# Patient Record
Sex: Female | Born: 1970 | Race: White | Hispanic: No | Marital: Married | Smoking: Never smoker
Health system: Southern US, Community
[De-identification: ages and names within clinical notes are randomized; demographics above are authoritative.]

## PROBLEM LIST (undated history)

## (undated) DIAGNOSIS — I499 Cardiac arrhythmia, unspecified: Secondary | ICD-10-CM

## (undated) DIAGNOSIS — K263 Acute duodenal ulcer without hemorrhage or perforation: Secondary | ICD-10-CM

## (undated) DIAGNOSIS — K2100 Gastro-esophageal reflux disease with esophagitis, without bleeding: Secondary | ICD-10-CM

## (undated) DIAGNOSIS — D649 Anemia, unspecified: Secondary | ICD-10-CM

## (undated) DIAGNOSIS — R51 Headache: Secondary | ICD-10-CM

## (undated) DIAGNOSIS — K219 Gastro-esophageal reflux disease without esophagitis: Secondary | ICD-10-CM

## (undated) DIAGNOSIS — D45 Polycythemia vera: Secondary | ICD-10-CM

## (undated) DIAGNOSIS — R519 Headache, unspecified: Secondary | ICD-10-CM

## (undated) HISTORY — DX: Acute duodenal ulcer without hemorrhage or perforation: K26.3

## (undated) HISTORY — DX: Gastro-esophageal reflux disease with esophagitis: K21.0

## (undated) HISTORY — PX: UPPER GI ENDOSCOPY: SHX6162

## (undated) HISTORY — DX: Gastro-esophageal reflux disease with esophagitis, without bleeding: K21.00

## (undated) HISTORY — DX: Gastro-esophageal reflux disease without esophagitis: K21.9

## (undated) HISTORY — PX: CHOLECYSTECTOMY: SHX55

## (undated) HISTORY — PX: ABDOMINAL HYSTERECTOMY: SHX81

---

## 1997-12-22 ENCOUNTER — Inpatient Hospital Stay (HOSPITAL_COMMUNITY): Admission: AD | Admit: 1997-12-22 | Discharge: 1997-12-22 | Payer: Self-pay | Admitting: Obstetrics and Gynecology

## 1998-01-27 ENCOUNTER — Inpatient Hospital Stay (HOSPITAL_COMMUNITY): Admission: AD | Admit: 1998-01-27 | Discharge: 1998-01-30 | Payer: Self-pay | Admitting: Obstetrics and Gynecology

## 1999-04-08 ENCOUNTER — Emergency Department (HOSPITAL_COMMUNITY): Admission: EM | Admit: 1999-04-08 | Discharge: 1999-04-08 | Payer: Self-pay | Admitting: Emergency Medicine

## 2000-06-29 ENCOUNTER — Inpatient Hospital Stay (HOSPITAL_COMMUNITY): Admission: AD | Admit: 2000-06-29 | Discharge: 2000-06-29 | Payer: Self-pay | Admitting: Obstetrics and Gynecology

## 2000-08-22 ENCOUNTER — Inpatient Hospital Stay (HOSPITAL_COMMUNITY): Admission: AD | Admit: 2000-08-22 | Discharge: 2000-08-24 | Payer: Self-pay | Admitting: Obstetrics and Gynecology

## 2000-10-03 ENCOUNTER — Inpatient Hospital Stay (HOSPITAL_COMMUNITY): Admission: AD | Admit: 2000-10-03 | Discharge: 2000-10-06 | Payer: Self-pay | Admitting: Obstetrics and Gynecology

## 2000-10-27 ENCOUNTER — Other Ambulatory Visit: Admission: RE | Admit: 2000-10-27 | Discharge: 2000-10-27 | Payer: Self-pay | Admitting: Obstetrics and Gynecology

## 2001-10-30 ENCOUNTER — Encounter (INDEPENDENT_AMBULATORY_CARE_PROVIDER_SITE_OTHER): Payer: Self-pay | Admitting: *Deleted

## 2001-10-30 ENCOUNTER — Ambulatory Visit (HOSPITAL_COMMUNITY): Admission: AD | Admit: 2001-10-30 | Discharge: 2001-10-30 | Payer: Self-pay | Admitting: *Deleted

## 2002-06-23 ENCOUNTER — Other Ambulatory Visit: Admission: RE | Admit: 2002-06-23 | Discharge: 2002-06-23 | Payer: Self-pay | Admitting: Obstetrics and Gynecology

## 2002-12-01 ENCOUNTER — Emergency Department (HOSPITAL_COMMUNITY): Admission: EM | Admit: 2002-12-01 | Discharge: 2002-12-01 | Payer: Self-pay | Admitting: Emergency Medicine

## 2002-12-09 ENCOUNTER — Encounter: Payer: Self-pay | Admitting: Internal Medicine

## 2003-09-01 ENCOUNTER — Other Ambulatory Visit: Admission: RE | Admit: 2003-09-01 | Discharge: 2003-09-01 | Payer: Self-pay | Admitting: Obstetrics and Gynecology

## 2004-03-07 ENCOUNTER — Encounter: Admission: RE | Admit: 2004-03-07 | Discharge: 2004-03-07 | Payer: Self-pay | Admitting: Family Medicine

## 2005-01-18 ENCOUNTER — Other Ambulatory Visit: Admission: RE | Admit: 2005-01-18 | Discharge: 2005-01-18 | Payer: Self-pay | Admitting: Obstetrics and Gynecology

## 2006-04-25 ENCOUNTER — Ambulatory Visit (HOSPITAL_COMMUNITY): Admission: RE | Admit: 2006-04-25 | Discharge: 2006-04-25 | Payer: Self-pay | Admitting: Obstetrics and Gynecology

## 2006-07-22 ENCOUNTER — Emergency Department (HOSPITAL_COMMUNITY): Admission: EM | Admit: 2006-07-22 | Discharge: 2006-07-22 | Payer: Self-pay | Admitting: Family Medicine

## 2007-03-25 ENCOUNTER — Ambulatory Visit: Payer: Self-pay | Admitting: Internal Medicine

## 2007-03-25 LAB — CONVERTED CEMR LAB
ALT: 15 units/L (ref 0–35)
AST: 14 units/L (ref 0–37)
Albumin: 4 g/dL (ref 3.5–5.2)
Alkaline Phosphatase: 111 units/L (ref 39–117)
Amylase: 47 units/L (ref 27–131)
BUN: 7 mg/dL (ref 6–23)
Bacteria, UA: NEGATIVE
Basophils Absolute: 0.1 10*3/uL (ref 0.0–0.1)
Basophils Relative: 1 % (ref 0.0–1.0)
Bilirubin Urine: NEGATIVE
Bilirubin, Direct: 0.1 mg/dL (ref 0.0–0.3)
CO2: 28 meq/L (ref 19–32)
Calcium: 9.6 mg/dL (ref 8.4–10.5)
Chloride: 106 meq/L (ref 96–112)
Creatinine, Ser: 0.7 mg/dL (ref 0.4–1.2)
Crystals: NEGATIVE
Eosinophils Absolute: 0.2 10*3/uL (ref 0.0–0.6)
Eosinophils Relative: 2.5 % (ref 0.0–5.0)
GFR calc Af Amer: 122 mL/min
GFR calc non Af Amer: 101 mL/min
Glucose, Bld: 113 mg/dL — ABNORMAL HIGH (ref 70–99)
HCT: 40.6 % (ref 36.0–46.0)
Hemoglobin, Urine: NEGATIVE
Hemoglobin: 14.4 g/dL (ref 12.0–15.0)
Ketones, ur: NEGATIVE mg/dL
Leukocytes, UA: NEGATIVE
Lipase: 21 units/L (ref 11.0–59.0)
Lymphocytes Relative: 28.5 % (ref 12.0–46.0)
MCHC: 35.5 g/dL (ref 30.0–36.0)
MCV: 84.6 fL (ref 78.0–100.0)
Monocytes Absolute: 0.5 10*3/uL (ref 0.2–0.7)
Monocytes Relative: 6.2 % (ref 3.0–11.0)
Mucus, UA: NEGATIVE
Neutro Abs: 5.1 10*3/uL (ref 1.4–7.7)
Neutrophils Relative %: 61.8 % (ref 43.0–77.0)
Nitrite: NEGATIVE
Platelets: 339 10*3/uL (ref 150–400)
Potassium: 4.1 meq/L (ref 3.5–5.1)
RBC: 4.79 M/uL (ref 3.87–5.11)
RDW: 12.6 % (ref 11.5–14.6)
Sodium: 140 meq/L (ref 135–145)
Specific Gravity, Urine: 1.015 (ref 1.000–1.03)
Total Bilirubin: 0.9 mg/dL (ref 0.3–1.2)
Total Protein, Urine: NEGATIVE mg/dL
Total Protein: 7.3 g/dL (ref 6.0–8.3)
Urine Glucose: NEGATIVE mg/dL
Urobilinogen, UA: 0.2 (ref 0.0–1.0)
WBC: 8.3 10*3/uL (ref 4.5–10.5)
pH: 6 (ref 5.0–8.0)

## 2007-03-30 ENCOUNTER — Ambulatory Visit (HOSPITAL_COMMUNITY): Admission: RE | Admit: 2007-03-30 | Discharge: 2007-03-30 | Payer: Self-pay | Admitting: Internal Medicine

## 2007-04-06 ENCOUNTER — Emergency Department (HOSPITAL_COMMUNITY): Admission: EM | Admit: 2007-04-06 | Discharge: 2007-04-06 | Payer: Self-pay | Admitting: Emergency Medicine

## 2007-06-10 ENCOUNTER — Encounter: Admission: RE | Admit: 2007-06-10 | Discharge: 2007-06-10 | Payer: Self-pay | Admitting: Orthopedic Surgery

## 2007-08-11 DIAGNOSIS — R519 Headache, unspecified: Secondary | ICD-10-CM | POA: Insufficient documentation

## 2007-08-11 DIAGNOSIS — Z8711 Personal history of peptic ulcer disease: Secondary | ICD-10-CM | POA: Insufficient documentation

## 2007-08-11 DIAGNOSIS — D509 Iron deficiency anemia, unspecified: Secondary | ICD-10-CM | POA: Insufficient documentation

## 2007-08-11 DIAGNOSIS — R51 Headache: Secondary | ICD-10-CM

## 2007-08-11 DIAGNOSIS — K209 Esophagitis, unspecified: Secondary | ICD-10-CM

## 2007-08-11 DIAGNOSIS — D500 Iron deficiency anemia secondary to blood loss (chronic): Secondary | ICD-10-CM | POA: Insufficient documentation

## 2007-08-11 DIAGNOSIS — K219 Gastro-esophageal reflux disease without esophagitis: Secondary | ICD-10-CM | POA: Insufficient documentation

## 2009-03-29 ENCOUNTER — Emergency Department (HOSPITAL_COMMUNITY): Admission: EM | Admit: 2009-03-29 | Discharge: 2009-03-29 | Payer: Self-pay | Admitting: Emergency Medicine

## 2009-03-29 ENCOUNTER — Emergency Department (HOSPITAL_COMMUNITY): Admission: EM | Admit: 2009-03-29 | Discharge: 2009-03-29 | Payer: Self-pay | Admitting: Family Medicine

## 2009-11-09 ENCOUNTER — Ambulatory Visit (HOSPITAL_COMMUNITY): Admission: RE | Admit: 2009-11-09 | Discharge: 2009-11-09 | Payer: Self-pay | Admitting: Obstetrics and Gynecology

## 2010-03-13 ENCOUNTER — Inpatient Hospital Stay (HOSPITAL_COMMUNITY): Admission: RE | Admit: 2010-03-13 | Discharge: 2010-03-14 | Payer: Self-pay | Admitting: Obstetrics and Gynecology

## 2010-03-13 ENCOUNTER — Encounter (INDEPENDENT_AMBULATORY_CARE_PROVIDER_SITE_OTHER): Payer: Self-pay | Admitting: Obstetrics and Gynecology

## 2010-06-28 ENCOUNTER — Emergency Department (HOSPITAL_COMMUNITY)
Admission: EM | Admit: 2010-06-28 | Discharge: 2010-06-28 | Payer: Self-pay | Source: Home / Self Care | Admitting: Family Medicine

## 2010-07-01 ENCOUNTER — Encounter: Payer: Self-pay | Admitting: Family Medicine

## 2010-07-12 NOTE — Procedures (Signed)
Summary: Gastroenterology EGD  Gastroenterology EGD   Imported By: Milford Cage CMA 08/14/2007 14:28:15  _____________________________________________________________________  External Attachment:    Type:   Image     Comment:   External Document

## 2010-08-22 LAB — CBC
HCT: 34.6 % — ABNORMAL LOW (ref 36.0–46.0)
Hemoglobin: 11.9 g/dL — ABNORMAL LOW (ref 12.0–15.0)
MCH: 31.2 pg (ref 26.0–34.0)
MCHC: 34.3 g/dL (ref 30.0–36.0)
MCV: 91 fL (ref 78.0–100.0)
Platelets: 267 10*3/uL (ref 150–400)
RBC: 3.81 MIL/uL — ABNORMAL LOW (ref 3.87–5.11)
RDW: 13.6 % (ref 11.5–15.5)
WBC: 11 10*3/uL — ABNORMAL HIGH (ref 4.0–10.5)

## 2010-08-22 LAB — HCG, SERUM, QUALITATIVE: Preg, Serum: NEGATIVE

## 2010-08-23 LAB — SURGICAL PCR SCREEN
MRSA, PCR: NEGATIVE
Staphylococcus aureus: NEGATIVE

## 2010-08-23 LAB — CBC
HCT: 42.2 % (ref 36.0–46.0)
Hemoglobin: 14.3 g/dL (ref 12.0–15.0)
MCH: 30.8 pg (ref 26.0–34.0)
MCHC: 34 g/dL (ref 30.0–36.0)
MCV: 90.4 fL (ref 78.0–100.0)
Platelets: 345 10*3/uL (ref 150–400)
RBC: 4.66 MIL/uL (ref 3.87–5.11)
RDW: 13.8 % (ref 11.5–15.5)
WBC: 8.9 10*3/uL (ref 4.0–10.5)

## 2010-08-27 LAB — CBC
HCT: 43.3 % (ref 36.0–46.0)
Hemoglobin: 14.9 g/dL (ref 12.0–15.0)
MCHC: 34.3 g/dL (ref 30.0–36.0)
MCV: 89.4 fL (ref 78.0–100.0)
Platelets: 290 10*3/uL (ref 150–400)
RBC: 4.85 MIL/uL (ref 3.87–5.11)
RDW: 14.4 % (ref 11.5–15.5)
WBC: 6 10*3/uL (ref 4.0–10.5)

## 2010-08-27 LAB — HCG, SERUM, QUALITATIVE: Preg, Serum: NEGATIVE

## 2010-09-13 LAB — BASIC METABOLIC PANEL
BUN: 9 mg/dL (ref 6–23)
CO2: 22 mEq/L (ref 19–32)
Calcium: 8.7 mg/dL (ref 8.4–10.5)
Chloride: 109 mEq/L (ref 96–112)
Creatinine, Ser: 0.79 mg/dL (ref 0.4–1.2)
GFR calc Af Amer: >60 mL/min (ref >60)
GFR calc non Af Amer: >60 mL/min (ref >60)
Glucose, Bld: 95 mg/dL (ref 70–99)
Potassium: 3.9 mEq/L (ref 3.5–5.1)
Sodium: 137 mEq/L (ref 135–145)

## 2010-09-13 LAB — DIFFERENTIAL
Basophils Absolute: 0.1 10*3/uL (ref 0.0–0.1)
Basophils Relative: 1 % (ref 0–1)
Eosinophils Absolute: 0.2 10*3/uL (ref 0.0–0.7)
Eosinophils Relative: 3 % (ref 0–5)
Lymphocytes Relative: 30 % (ref 12–46)
Lymphs Abs: 2.5 10*3/uL (ref 0.7–4.0)
Monocytes Absolute: 0.5 10*3/uL (ref 0.1–1.0)
Monocytes Relative: 6 % (ref 3–12)
Neutro Abs: 5 10*3/uL (ref 1.7–7.7)
Neutrophils Relative %: 60 % (ref 43–77)

## 2010-09-13 LAB — CBC
HCT: 39.5 % (ref 36.0–46.0)
Hemoglobin: 13.7 g/dL (ref 12.0–15.0)
MCHC: 34.6 g/dL (ref 30.0–36.0)
MCV: 89 fL (ref 78.0–100.0)
Platelets: 300 10*3/uL (ref 150–400)
RBC: 4.43 MIL/uL (ref 3.87–5.11)
RDW: 13.9 % (ref 11.5–15.5)
WBC: 8.4 10*3/uL (ref 4.0–10.5)

## 2010-09-13 LAB — POCT CARDIAC MARKERS
CKMB, poc: <1 ng/mL — ABNORMAL LOW (ref 1.0–8.0)
Myoglobin, poc: 47.5 ng/mL (ref 12–200)
Troponin i, poc: <0.05 ng/mL (ref 0.00–0.09)

## 2010-10-23 NOTE — Assessment & Plan Note (Signed)
[facility]                         GASTROENTEROLOGY OFFICE NOTE   NAME:Eileen [name], Eileen [name]                    MRN:          [id number]  DATE:03/25/2007                            DOB:          1970/07/20    Patient self-referred.   REASON FOR EVALUATION:  Postprandial chest pain.   HISTORY:  This is a 40 year old white female with a history of migraine  headaches, gastroesophageal reflux disease, duodenal ulcer, and morbid  obesity.  She presents today regarding postprandial chest pain.  The  patient has not been seen in this office since 2004.  She reports to me  a 2-week history of stabbing midsternal chest pain with radiation into  the right shoulder blade region occurring 30 to 60 minutes after meals.  This has occurred several times per day over the past few weeks.  The  pain generally lasts anywhere from 1 to 3 hours.  She was awoken on one  occasion.  Associated with this is the urge to defecate, which is  followed by loose stools.  She self-medicated with every-other-day  Prilosec for 1 week (3 doses) with no improvement.  She denies reflux  symptoms such as heartburn or indigestion.  She has had some nausea.  She denies that this discomfort is like her prior ulcer pain.  There has  been no fevers, melena, weight loss, change in urine color or other  relevant symptoms.   PAST MEDICAL HISTORY:  As above.   PAST SURGICAL HISTORY:  None.   FAMILY HISTORY:  Negative for gastrointestinal malignancy.   SOCIAL HISTORY:  1. Married with 3 children.  2. 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  3. Does not smoke or use alcohol.   REVIEW OF SYSTEMS:  Per Diagnostic Evaluation Form.   ALLERGIES:  No known drug allergies.   CURRENT MEDICATIONS:  Depo injection once every 3 months.   PHYSICAL EXAMINATION:  A well-appearing female in no acute distress.  Blood pressure is 110/78, heart rate 72, weight is 265.4 pounds.  She is  5 feet  3 inches in height.  HEENT:  Sclerae are anicteric.  Conjunctivae are pink.  Oral mucosa is  intact.  There is no adenopathy.  LUNGS:  Clear.  HEART:  Regular.  ABDOMEN:  Obese and soft, without tenderness, mass or hernia.  EXTREMITIES:  Without edema.   IMPRESSION:  Postprandial mid chest pain with radiation into the right  shoulder blade region.  Rule out biliary colic.  Rule out acid peptic  disorders.   RECOMMENDATION:  1. Laboratories including CBC, liver function test, amylase and      lipase.  2. Urinalysis.  3. Schedule abdominal ultrasound to rule out gallstones.  4. Empirically initiate proton inhibitor therapy.  Aciphex 20 mg      b.i.d. has been provided.  5. Office followup in 2 weeks.  If the above workup is unrevealing and      symptoms persist, then proceed with upper endoscopy and possible      HIDA scan.     Wilhemina Bonito. Marina Goodell, MD  Electronically Signed    JNP/MedQ  DD: 03/25/2007  DT: 03/26/2007  Job #: [id number]

## 2010-10-26 NOTE — Op Note (Signed)
[facility]  Patient:    Eileen [name], Eileen [name]                    MRN: [id number] Proc. Date: 10/03/00 Adm. Date:  [id number] Attending:  Frederich Balding                           Operative Report  PREOPERATIVE DIAGNOSIS:       Intrauterine pregnancy at term with a prior cesarean section who desires a repeat.  POSTOPERATIVE DIAGNOSIS:      Intrauterine pregnancy at term with a prior cesarean section who desires a repeat.  OPERATION:                    Low transverse cesarean section and lysis of multiple adhesions.  SURGEON:                      Juluis Mire, M.D.  ANESTHESIA:                   Spinal.  ESTIMATED BLOOD LOSS:         800 cc.  PACKS AND DRAINS:             None.  INTRAOPERATIVE BLOOD REPLACEMENT:            None.  COMPLICATIONS:                None.  INDICATIONS:                  Dictated in History and Physical.  DESCRIPTION OF PROCEDURE:     The patient was taken to the OR and placed in the supine position with left lateral tilt. After satisfactory level of spinal anesthesia was obtained, the abdomen was prepped out with Betadine and draped as a sterile field. Prior low transverse skin incision was then excised. The incision was then extended through the subcutaneous tissue. The fascia was entered sharply and the incision of the fascia was extended laterally. The fascia was taken off the muscle superiorly and inferiorly. The rectus muscles were separated in the midline. Thin scarring was noted between the anterior abdominal wall and the uterus. We eventually found a window on the inferior aspect and were able to enter the peritoneum. Again, the upper part of the uterus was densely adherent to the abdominal wall. We were able to put the bladder blade in place and develop a low transverse bladder flap. Next, a low transverse uterine incision was begun with the knife. The incision in the uterus was then extended laterally  using manual traction and the bandage scissors. The amniotic fluid was clear. It was difficult due to the scarring around the lower segment to deliver the vertex. We therefore cut the rectus muscles and applied the vacuum extractor. With this, we successfully delivered the fetal vertex. The remainder of the baby was delivered in the usual manner. The infant was a viable female who weighed 7 pounds 5 ounces, Apgars were 8/9, and umbilical artery pH was 7.20. The placenta was then delivered manually. The uterus was wiped free of remaining membranes and placenta. We then identified this large band of thick adhesion between the anterior part of the uterus and the abdominal wall. The abdominal wall was elevated with Kochers and using the bipolar, we dissected the adhesion down. There was also some omental adhesions that were taken down using the  bipolar. We had good hemostasis. The uterus was then exteriorized for closure. Tubes and ovaries were noted to be unremarkable. The low transverse uterine incision was closed with running locking suture of 0 chromic using a two-layer closure technique. The defect in the upper part of the uterus was brought together with a running suture of 0 Vicryl. The areas of bleeding were brought under control with figure-of-eights of 0 Vicryl. We had good hemostasis. Urine output remained clear and adequate. The uterus was returned to the abdominal cavity. Pelvic cavity was thoroughly irrigated. Hemostasis was excellent and urine output remained clear and adequate. Interceed was then put in place. The peritoneum was then closed in a running suture of 3-0 Vicryl. Muscles were reapproximated with a running suture of 3-0 Vicryl. We had good hemostasis. The fascia was closed in a running suture of 0 Panacryl. The subcutaneous was closed with a running suture of 3-0 Vicryl. The skin was closed with staples and Steri-Strips. Sponge, instrument, and needle count reported as  correct by circulating nurse x 2. Foley catheter remained clear at time of closure. The patient tolerated the procedure well and returned to recovery room in good condition. DD:  10/03/00 TD:  10/03/00 Job: [id number]

## 2010-10-26 NOTE — Discharge Summary (Signed)
[facility]  Patient:    Eileen [name], Eileen [name]                    MRN: [id number] Adm. Date:  [id number] Disc. Date: [id number] Attending:  Frederich Balding Dictator:   Danie Chandler, R.N.                           Discharge Summary  ADMITTING DIAGNOSES:          Intrauterine pregnancy at term with a prior cesarean section who desires a repeat.  DISCHARGE DIAGNOSES:          Intrauterine pregnancy at term with a prior cesarean section who desires a repeat.  PROCEDURE:                    On October 03, 2000 repeat low transverse cesarean section and lysis of multiple adhesions.  REASON FOR ADMISSION:         Please see dictated H&P.  HOSPITAL COURSE:              The patient was taken to the operating room and underwent the above named procedure without complication.  This was productive of a viable female infant with Apgars of 8 at one minute and 9 at five minutes.  Postoperatively on day #1 the patient was without complaint.  Her hemoglobin was 10.2, hematocrit 29.2, and white blood cell count 13.1.  On postoperative day #2 the patient was ambulating well without difficulty.  She had good pain control.  She had a good return of bowel function and was tolerating a regular diet.  She was discharged home on postoperative day #3.  CONDITION ON DISCHARGE:       Good.  DIET:                         Regular, as tolerated.  ACTIVITY:                     No heavy lifting, no driving, no vaginal entry.  FOLLOW-UP:                    She is to follow-up in the office in one week. She is to call for temperature greater than 100 degrees, persistent nausea or vomiting, heavy vaginal bleeding, and/or redness or drainage from the incision site.  DISCHARGE MEDICATIONS:        1. Prenatal vitamins one p.o. q.d.                               2. Tylox as directed by M.D. DD:  10/06/00 TD:  10/06/00 Job: [id number]

## 2010-10-26 NOTE — H&P (Signed)
[facility]  Patient:    FEVEN, ALDERFER                      MRN: [id number] Adm. Date:  [id number] Disc. Date: [id number] Attending:  Madelyn Flavors                         History and Physical  HISTORY OF PRESENT ILLNESS:   Ms. Fitzpatrick is a 40 year old G3, P2 at 20 5/7 weeks who presented to the office with decreased fetal movement.  Nonstress test was reactive but patient was contracting every two to three minutes.  She is sent to triage for further evaluation.  Cervix was noted to be closed, 50% effaced, and contracting every three minutes.  She was given subcutaneous terbutaline twice without much response.  We tried sedation with morphine without success.  Patient was finally started on magnesium sulfate for tocolysis with good results.  Estimated date of confinement is 49 5/7. Previous history of two cesarean sections.  Previous history of preeclampsia.  PAST MEDICAL HISTORY:         Negative.  PAST SURGICAL HISTORY:        Cesarean section 1995 and 1999.  In 1995 it was for failed induction.  In 1999 it was a repeat cesarean section.  Patient is for repeat cesarean section at this time.  MEDICATIONS:                  Prenatal vitamins.  ALLERGIES:                    No known drug allergies.  PHYSICAL EXAMINATION  VITAL SIGNS:                  Blood pressure 132/81, fetal heart rate 140s with good accelerations, contractions after magnesium sulfate are irregular.  PELVIC:                       Cervix:  Closed, 50%, -3 station.  LABORATORIES:                 Urinalysis shows trace ketones.  IMPRESSION AND PLAN:          Intrauterine pregnancy at 44 5/7 with preterm labor not responding to conservative tocolytic therapy.  Magnesium sulfate tocolysis.  We will plan betamethasone 12.5 mg IM for two doses for fetal lung maturity and bed rest. DD:  08/22/00 TD:  08/22/00 Job: [id number]

## 2010-10-26 NOTE — Op Note (Signed)
[facility]  Patient:    Eileen [name], Eileen [name] Visit Number: [id number] MRN: [id number]          Service Type: DSU Location: [facility] Attending Physician:  Donne Hazel Dictated by:   Willey Blade, M.D. Proc. Date: 10/30/01 Admit Date:  10/30/2001 Discharge Date: 10/30/2001                             Operative Report  PREOPERATIVE DIAGNOSES:       Incomplete abortion.  POSTOPERATIVE DIAGNOSES:      Incomplete abortion.  PROCEDURE:                    Dilatation and evacuation.  SURGEON:                      Willey Blade, M.D.  ANESTHESIA:                   MAC with local.  ESTIMATED BLOOD LOSS:         100 cc.  COMPLICATIONS:                None.  FINDINGS:                     Products of conception.  DESCRIPTION OF PROCEDURE:     The patient was taken to the operating room where a MAC anesthetic was administered.  The patient was placed on the operating table in the dorsal lithotomy position.  The perineum and vagina were prepped and draped in the usual sterile fashion with Betadine and sterile drapes.  A Red rubber catheter was used to empty the bladder.  Next, a speculum was placed and the anterior lip of the cervix was grasped with a single tooth tenaculum.  A paracervical block was then placed with 10 cc of 1% Xylocaine.  Next, the cervix was noted to be quite dilated and fit easily a #29 Pratt dilator.  Next, using a #10 curved suction curette the uterus was systematically and thoroughly emptied in the standard fashion for a suction D&E.  About three passes were made with the suction curette with a large amount of products of conception and blood aspirated.  After this the uterus was sharply curetted with a small serrated curette and noted to be empty.  Two final passes were then made with the suction curette and the uterus was noted to be empty.  All vaginal instruments were then removed.  The patient was awakened and taken to  the recovery room in good condition.  There were no perioperative complications.  The patients blood type is B+. Dictated by:   Willey Blade, M.D. Attending Physician:  Donne Hazel DD:  10/30/01 TD:  11/03/01 Job: [id number]

## 2010-10-26 NOTE — H&P (Signed)
[facility]  Patient:    Eileen [name], Eileen [name]                    MRN: [id number] Adm. Date:  [id number] Attending:  Frederich Balding                         History and Physical                                Patient is a 40 year old gravida 3, para 2 married white female with last menstrual period of July 30 giving estimated date of confinement of May 5 and an estimated gestational age of [redacted] weeks. This is consistent with initial examination as well as ultrasound.  She presents now for a repeat cesarean section.                                In relation to the present admission, patient has had two prior cesarean sections done with her prior pregnancy.  The option of trial of labor has been discussed.  This is declined by the patient.  She presents for repeat cesarean section.  She has also had trouble with mild elevations of blood pressures late in pregnancy.  Workup for toxemia has been negative.  Because of preterm labor problems she was placed on Procardia 10 mg q.i.d.  With this we have good control of blood pressure as well as uterine activity.  She remains on the Procardia at the present time.  Her 50 g glucola was 137.  ALLERGIES:                    No known drug allergies.  MEDICATIONS:                  Prenatal vitamins and Procardia.  PAST MEDICAL HISTORY:         Please see prenatal records.  FAMILY HISTORY:               Please see prenatal records.  SOCIAL HISTORY:               Please see prenatal records.  REVIEW OF SYSTEMS:            Noncontributory.  PHYSICAL EXAMINATION  VITAL SIGNS:                  Patient is afebrile with stable vital signs.  HEENT:                        Patient is normocephalic.  Pupils are equal, round and reactive to light and accommodation.  Extraocular movements are intact.  Sclerae and conjunctivae were clear.  Oropharynx:  Clear.  NECK:                         Without  thyromegaly.  BREASTS:                      Glandular, but no discreet masses.  LUNGS:                        Clear.  CARDIOVASCULAR:               Regular  rhythm and rate with a grade 2/6 systolic ejection murmur.  No clicks or gallops.  ABDOMEN:                      Gravid uterus consistent with dates.  Fetal heart tones audible.  PELVIC:                       Deferred.  EXTREMITIES:                  Trace edema.  NEUROLOGIC:                   Within normal limits.  Deep tendon reflexes 2+. No clonus.  IMPRESSION:                   1. Intrauterine pregnancy at 39 weeks with prior cesarean section, desires repeat.                               2. Mild elevation of blood pressure in second trimester on Procardia.  PLAN:                         Patient to undergo repeat cesarean section.  The risks of surgery have been discussed including the risk of anesthesia, the risk of infection, the risk of hemorrhage that could necessitate transfusion with the risk of AIDS or hepatitis, the risk of injury to adjacent organs including bladder, bowel, or ureters that could require exploratory surgery, the risk of deep venous thrombosis and pulmonary embolus.  Patient expressed understanding of indications and risks. DD:  10/03/00 TD:  10/03/00 Job: [id number]

## 2010-11-14 ENCOUNTER — Inpatient Hospital Stay (INDEPENDENT_AMBULATORY_CARE_PROVIDER_SITE_OTHER)
Admission: RE | Admit: 2010-11-14 | Discharge: 2010-11-14 | Disposition: A | Discharge status: Home / Self Care | Payer: 59 | Source: Ambulatory Visit | Attending: Emergency Medicine | Admitting: Emergency Medicine

## 2010-11-14 DIAGNOSIS — L259 Unspecified contact dermatitis, unspecified cause: Secondary | ICD-10-CM

## 2010-11-23 ENCOUNTER — Inpatient Hospital Stay (INDEPENDENT_AMBULATORY_CARE_PROVIDER_SITE_OTHER)
Admission: RE | Admit: 2010-11-23 | Discharge: 2010-11-23 | Disposition: A | Discharge status: Home / Self Care | Payer: 59 | Source: Ambulatory Visit

## 2010-11-23 DIAGNOSIS — L989 Disorder of the skin and subcutaneous tissue, unspecified: Secondary | ICD-10-CM

## 2012-05-12 ENCOUNTER — Encounter (HOSPITAL_COMMUNITY): Payer: Self-pay | Admitting: *Deleted

## 2012-05-12 ENCOUNTER — Telehealth: Payer: Self-pay | Admitting: Internal Medicine

## 2012-05-12 ENCOUNTER — Emergency Department (INDEPENDENT_AMBULATORY_CARE_PROVIDER_SITE_OTHER)
Admission: EM | Admit: 2012-05-12 | Discharge: 2012-05-12 | Disposition: A | Discharge status: Home / Self Care | Payer: 59 | Source: Home / Self Care | Attending: Family Medicine | Admitting: Family Medicine

## 2012-05-12 DIAGNOSIS — K219 Gastro-esophageal reflux disease without esophagitis: Secondary | ICD-10-CM

## 2012-05-12 MED ORDER — GI COCKTAIL ~~LOC~~
ORAL | Status: AC
  Filled 2012-05-12: qty 30

## 2012-05-12 MED ORDER — GI COCKTAIL ~~LOC~~
30.0000 mL | Freq: Once | ORAL | Status: AC
  Administered 2012-05-12: 30 mL via ORAL

## 2012-05-12 MED ORDER — PANTOPRAZOLE SODIUM 40 MG PO TBEC
40.0000 mg | DELAYED_RELEASE_TABLET | Freq: Every day | ORAL | Status: DC
Start: 2012-05-12 — End: 2014-12-19

## 2012-05-12 NOTE — ED Provider Notes (Signed)
History     CSN: [id number]  Arrival date & time 05/12/12  1213   First MD Initiated Contact with Patient 05/12/12 1224      Chief Complaint  Patient presents with  . Abdominal Pain    (Consider location/radiation/quality/duration/timing/severity/associated sxs/prior treatment) Patient is a 41 y.o. female presenting with abdominal pain. The history is provided by the patient and the spouse.  Abdominal Pain The primary symptoms of the illness include abdominal pain and nausea. The primary symptoms of the illness do not include fever, fatigue, vomiting, diarrhea, hematochezia or dysuria. The current episode started 3 to 5 hours ago (sx typically 30 min  after eating, radiates to interscapular back). The onset of the illness was sudden. The problem has been gradually improving.  The patient states that she believes she is currently not pregnant. The patient has not had a change in bowel habit. Additional symptoms associated with the illness include heartburn. Symptoms associated with the illness do not include constipation.    History reviewed. No pertinent past medical history.  Past Surgical History  Procedure Date  . Abdominal hysterectomy     No family history on file.  History  Substance Use Topics  . Smoking status: Never Smoker   . Smokeless tobacco: Not on file  . Alcohol Use: No    OB History    Grav Para Term Preterm Abortions TAB SAB Ect Mult Living                  Review of Systems  Constitutional: Positive for appetite change. Negative for fever and fatigue.  HENT: Negative.   Gastrointestinal: Positive for heartburn, nausea and abdominal pain. Negative for vomiting, diarrhea, constipation, blood in stool and hematochezia.  Genitourinary: Negative for dysuria.    Allergies  Review of patient's allergies indicates no known allergies.  Home Medications   Current Outpatient Rx  Name  Route  Sig  Dispense  Refill  . PANTOPRAZOLE SODIUM 40 MG PO TBEC  Oral   Take 1 tablet (40 mg total) by mouth daily.   30 tablet   2     BP 144/92  Pulse 96  Temp 98.3 F (36.8 C) (Oral)  Resp 16  SpO2 100%  Physical Exam  Nursing note and vitals reviewed. Constitutional: She is oriented to person, place, and time. She appears well-developed and well-nourished.  Neck: Normal range of motion. Neck supple.  Cardiovascular: Normal rate, regular rhythm, normal heart sounds and intact distal pulses.   Pulmonary/Chest: Effort normal and breath sounds normal.  Abdominal: Soft. Bowel sounds are normal. She exhibits no distension and no mass. There is tenderness. There is no rebound and no guarding.  Neurological: She is alert and oriented to person, place, and time.  Skin: Skin is warm and dry.    ED Course  Procedures (including critical care time)  Labs Reviewed - No data to display No results found.   1. GERD (gastroesophageal reflux disease)       MDM          Linna Hoff, MD 05/12/12 1318

## 2012-05-12 NOTE — Telephone Encounter (Signed)
Pt states she is having epigastric pain. Pt was seen in urgent care and states the medication she was given is not helping. Pt requesting to be seen. Pt scheduled to see Mike Gip PA tomorrow at 8:30am. Pt aware of appt date and time.

## 2012-05-12 NOTE — ED Notes (Signed)
Pt  Reports  Symptoms  Of  Epigastric pain  With  Nausea          Pain under  l  Breast          Pain is  Worse  After  Eating       Symptoms  X  2  Weeks           Pt  Reports  Her  Pain  Is      About  An 8       Pt  Advised  To  To remain npo

## 2012-05-12 NOTE — ED Notes (Signed)
Report  Given to  [facility]

## 2012-05-13 ENCOUNTER — Ambulatory Visit (INDEPENDENT_AMBULATORY_CARE_PROVIDER_SITE_OTHER): Payer: 59 | Admitting: Physician Assistant

## 2012-05-13 ENCOUNTER — Encounter: Payer: Self-pay | Admitting: *Deleted

## 2012-05-13 ENCOUNTER — Ambulatory Visit (HOSPITAL_COMMUNITY)
Admission: RE | Admit: 2012-05-13 | Discharge: 2012-05-13 | Disposition: A | Discharge status: Home / Self Care | Payer: 59 | Source: Ambulatory Visit | Attending: Physician Assistant | Admitting: Physician Assistant

## 2012-05-13 ENCOUNTER — Other Ambulatory Visit (INDEPENDENT_AMBULATORY_CARE_PROVIDER_SITE_OTHER): Payer: 59

## 2012-05-13 VITALS — BP 136/80 | HR 86 | Ht 64.0 in | Wt 237.0 lb

## 2012-05-13 DIAGNOSIS — R1013 Epigastric pain: Secondary | ICD-10-CM

## 2012-05-13 DIAGNOSIS — R110 Nausea: Secondary | ICD-10-CM

## 2012-05-13 LAB — CBC WITH DIFFERENTIAL/PLATELET
Basophils Absolute: 0.1 10*3/uL (ref 0.0–0.1)
Basophils Relative: 0.7 % (ref 0.0–3.0)
Eosinophils Absolute: 0.2 10*3/uL (ref 0.0–0.7)
Eosinophils Relative: 3 % (ref 0.0–5.0)
HCT: 47 % — ABNORMAL HIGH (ref 36.0–46.0)
Hemoglobin: 15.6 g/dL — ABNORMAL HIGH (ref 12.0–15.0)
Lymphocytes Relative: 26 % (ref 12.0–46.0)
Lymphs Abs: 2 10*3/uL (ref 0.7–4.0)
MCHC: 33.2 g/dL (ref 30.0–36.0)
MCV: 92.3 fl (ref 78.0–100.0)
Monocytes Absolute: 0.4 10*3/uL (ref 0.1–1.0)
Monocytes Relative: 5.3 % (ref 3.0–12.0)
Neutro Abs: 5 10*3/uL (ref 1.4–7.7)
Neutrophils Relative %: 65 % (ref 43.0–77.0)
Platelets: 486 10*3/uL — ABNORMAL HIGH (ref 150.0–400.0)
RBC: 5.09 Mil/uL (ref 3.87–5.11)
RDW: 15.1 % — ABNORMAL HIGH (ref 11.5–14.6)
WBC: 7.7 10*3/uL (ref 4.5–10.5)

## 2012-05-13 LAB — COMPREHENSIVE METABOLIC PANEL
ALT: 10 U/L (ref 0–35)
AST: 12 U/L (ref 0–37)
Albumin: 4.6 g/dL (ref 3.5–5.2)
Alkaline Phosphatase: 74 U/L (ref 39–117)
BUN: 11 mg/dL (ref 6–23)
CO2: 27 mEq/L (ref 19–32)
Calcium: 9.6 mg/dL (ref 8.4–10.5)
Chloride: 105 mEq/L (ref 96–112)
Creatinine, Ser: 0.9 mg/dL (ref 0.4–1.2)
GFR: 75.41 mL/min (ref >60.00)
Glucose, Bld: 101 mg/dL — ABNORMAL HIGH (ref 70–99)
Potassium: 4.6 mEq/L (ref 3.5–5.1)
Sodium: 138 mEq/L (ref 135–145)
Total Bilirubin: 0.6 mg/dL (ref 0.3–1.2)
Total Protein: 7.9 g/dL (ref 6.0–8.3)

## 2012-05-13 LAB — LIPASE: Lipase: 16 U/L (ref 11.0–59.0)

## 2012-05-13 MED ORDER — ONDANSETRON HCL 4 MG PO TABS: ORAL_TABLET | ORAL | Status: DC

## 2012-05-13 NOTE — Progress Notes (Signed)
Subjective:    Patient ID: Eileen [name], female    DOB: 07/11/1970, 41 y.o.   MRN: [id number]  HPI  Eileen [name] is a pleasant 40 year old white female known remotely to Dr. Marina Goodell. She had undergone upper endoscopy in 2004 with findings of a duodenal ulcer and GERD. She's not been seen here since that time. She is in generally good health status post 3 C-sections and a hysterectomy. She comes in today as an urgent add-on with complaints of 2 week history of progressive postprandial epigastric pain radiating into her back. She says his pain is occurring consistently within 30 minutes of eating each meal and may last up to 2 hours. She says the intensity of pain has worsened over the past week and she describes it as stabbing in nature. This is been associated with nausea, but no vomiting and some upper abdominal discomfort as well. She has had some loose stools during these episodes of pain, no melena or hematochezia. She's not had any documented fever or chills. She's not been on any regular aspirin or NSAIDs. She tried some of her husband's Prevacid that did not feel that this helped at all. She was seen at urgent care yesterday and given a prescription for protonix and asked to follow up here. She did have prior upper abdominal ultrasound done in 2008 which was negative on a and states that she also had a HIDA scan done several years before that they did show her gallbladder function to be decreased.    Review of Systems  Constitutional: Positive for appetite change.  HENT: Negative.   Eyes: Negative.   Respiratory: Negative.   Cardiovascular: Negative.   Gastrointestinal: Positive for nausea and abdominal pain.  Genitourinary: Negative.   Musculoskeletal: Positive for back pain.  Neurological: Negative.   Hematological: Negative.   Psychiatric/Behavioral: Negative.    Outpatient Prescriptions Prior to Visit  Medication Sig Dispense Refill  . pantoprazole (PROTONIX) 40 MG tablet Take 1  tablet (40 mg total) by mouth daily.  30 tablet  2   Facility-Administered Medications Prior to Visit  Medication Dose Route Frequency Provider Last Rate Last Dose  . [COMPLETED] gi cocktail (Maalox,Lidocaine,Donnatal)  30 mL Oral Once Linna Hoff, MD   30 mL at 05/12/12 1326      No Known Allergies Patient Active Problem List  Diagnosis  . MORBID OBESITY  . ANEMIA DUE TO CHRONIC BLOOD LOSS  . ESOPHAGITIS  . GERD  . HEADACHE, CHRONIC  . Personal History of Peptic Ulcer Disease   History  Substance Use Topics  . Smoking status: Never Smoker   . Smokeless tobacco: Never Used  . Alcohol Use: No   Family History  Problem Relation Age of Onset  . Colon cancer Neg Hx   . Breast cancer      [place]     Objective:   Physical Exam , well-developed obese white female in no acute distress, pleasant blood pressure 136/80 pulse 86 height 5 foot 4 weight 237. HEENT; nontraumatic normocephalic EOMI PERRLA sclera anicteric,Neck; Supple no JVD, Cardiovascular; regular rate and rhythm with S1-S2 no murmur or gallop, Pulmonary ;clear bilaterally, Abdomen; soft basically nontender there is no palpable mass or hepatosplenomegaly, bowel sounds are active, Recta;l exam not done, Extremities; no clubbing cyanosis or edema skin warm dry, Psych; mood and affect normal and appropriate        Assessment & Plan:  #24 41 year old female with prior history of duodenal ulcer, now presenting with two-week history of  postprandial epigastric/xiphoid pain with radiation to the back and associated nausea. Rule out biliary colic, rule out peptic ulcer disease #2 obesity #3 status post C-section x3 and hysterectomy  Plan; continue proton next 40 mg by mouth every morning for now Add Zofran 4 mg every 6 hours as needed for nausea Advised small very low-fat feedings Schedule for upper abdominal ultrasound today if possible CBC, CMET, and lipase Further plans pending results of ultrasound and labs-if  negative will consider [facility] HIDA scan, and EGD.

## 2012-05-13 NOTE — Progress Notes (Signed)
Agree with assessment and initial plans. Given her history of ulcer disease, suspect that she will need an upper endoscopy regardless of the ultrasound results (unless stones with dilated duct, increased LFTs, and/or pancreatitis). Amy to followup on results.

## 2012-05-13 NOTE — Patient Instructions (Addendum)
Please go to the basement level to have your labs drawn.   Stay on the Protonix 40 mg once daily, 30 min before breakfast. We sent a prescription for Zofran 4 mg every 6 hours as needed for nausea. [address]  We scheduled an Ultrasound at [facility], 1st floor. Go to Registration, front door.  Arrive at 1:45 PM.  Have nothing by mouth until after the test.  We will call you with the lab and Ultrasound results.

## 2012-05-14 ENCOUNTER — Telehealth: Payer: Self-pay | Admitting: Internal Medicine

## 2012-05-15 ENCOUNTER — Other Ambulatory Visit: Payer: Self-pay | Admitting: *Deleted

## 2012-05-15 DIAGNOSIS — R109 Unspecified abdominal pain: Secondary | ICD-10-CM

## 2012-05-15 NOTE — Telephone Encounter (Signed)
Pt aware. See result note. 

## 2012-06-04 ENCOUNTER — Encounter (HOSPITAL_COMMUNITY): Payer: 59

## 2012-06-16 ENCOUNTER — Encounter (HOSPITAL_COMMUNITY): Payer: 59

## 2012-06-17 ENCOUNTER — Telehealth: Payer: Self-pay | Admitting: *Deleted

## 2012-06-17 NOTE — Telephone Encounter (Signed)
Patient has not had her HIDA with [facility]. According to appointment records she was scheduled for 06/04/12 and she rescheduled to 06/16/12. She called radiology and cancelled her 06/16/12 appointment and declined to reschedule.

## 2013-05-03 ENCOUNTER — Emergency Department (INDEPENDENT_AMBULATORY_CARE_PROVIDER_SITE_OTHER)
Admission: EM | Admit: 2013-05-03 | Discharge: 2013-05-03 | Disposition: A | Discharge status: Home / Self Care | Payer: 59 | Source: Home / Self Care | Attending: Family Medicine | Admitting: Family Medicine

## 2013-05-03 ENCOUNTER — Encounter (HOSPITAL_COMMUNITY): Payer: Self-pay | Admitting: Emergency Medicine

## 2013-05-03 DIAGNOSIS — K5289 Other specified noninfective gastroenteritis and colitis: Secondary | ICD-10-CM

## 2013-05-03 DIAGNOSIS — K529 Noninfective gastroenteritis and colitis, unspecified: Secondary | ICD-10-CM

## 2013-05-03 MED ORDER — ACETAMINOPHEN 325 MG PO TABS
ORAL_TABLET | ORAL | Status: AC
  Filled 2013-05-03: qty 3

## 2013-05-03 MED ORDER — ONDANSETRON 4 MG PO TBDP
4.0000 mg | ORAL_TABLET | Freq: Once | ORAL | Status: AC
  Administered 2013-05-03: 4 mg via ORAL

## 2013-05-03 MED ORDER — ACETAMINOPHEN 500 MG PO TABS: 1000.0000 mg | ORAL_TABLET | Freq: Once | ORAL | Status: DC

## 2013-05-03 MED ORDER — ONDANSETRON 4 MG PO TBDP
ORAL_TABLET | ORAL | Status: AC
  Filled 2013-05-03: qty 1

## 2013-05-03 MED ORDER — PROMETHAZINE HCL 12.5 MG PO TABS: 12.5000 mg | ORAL_TABLET | Freq: Four times a day (QID) | ORAL | Status: DC | PRN

## 2013-05-03 MED ORDER — ACETAMINOPHEN 500 MG PO TABS
1000.0000 mg | ORAL_TABLET | Freq: Four times a day (QID) | ORAL | Status: DC | PRN
  Administered 2013-05-03: 1000 mg via ORAL

## 2013-05-03 NOTE — ED Provider Notes (Signed)
CSN: [id number]     Arrival date & time 05/03/13  1701 History   None    Chief Complaint  Patient presents with  . Fever   (Consider location/radiation/quality/duration/timing/severity/associated sxs/prior Treatment) HPI Comments: Pt reports chest tightness and feeling short of breath 2 days ago, sx worse with activity; this completely resolved spontaneously that same day. Yesterday pt had malaise and diarrhea x4 episodes, none today. Pt hasn't eaten today because felt poorly. This evening began having epigastric abd pain and vomiting and fever.   Patient is a 42 y.o. female presenting with vomiting.  Emesis Severity:  Moderate Duration:  1 day Timing:  Intermittent Number of daily episodes:  4 today Quality:  Stomach contents Progression:  Unchanged Chronicity:  New Relieved by:  None tried Worsened by:  Nothing tried Ineffective treatments:  None tried Associated symptoms: abdominal pain, chills, diarrhea and fever   Associated symptoms: no URI     Past Medical History  Diagnosis Date  . GERD (gastroesophageal reflux disease)   . Reflux esophagitis   . Acute duodenal ulcer without mention of hemorrhage, perforation, or obstruction    Past Surgical History  Procedure Laterality Date  . Abdominal hysterectomy    . Cesarean section      x 3    Family History  Problem Relation Age of Onset  . Colon cancer Neg Hx   . Breast cancer      [place]    History  Substance Use Topics  . Smoking status: Never Smoker   . Smokeless tobacco: Never Used  . Alcohol Use: No   OB History   Grav Para Term Preterm Abortions TAB SAB Ect Mult Living                 Review of Systems  Constitutional: Positive for fever and chills.  Respiratory: Positive for chest tightness and shortness of breath.   Gastrointestinal: Positive for nausea, vomiting, abdominal pain and diarrhea.    Allergies  Review of patient's allergies indicates no known allergies.  Home Medications    Current Outpatient Rx  Name  Route  Sig  Dispense  Refill  . ondansetron (ZOFRAN) 4 MG tablet      Take 1 tab every 6 hours as needed for nausea.   20 tablet   0   . pantoprazole (PROTONIX) 40 MG tablet   Oral   Take 1 tablet (40 mg total) by mouth daily.   30 tablet   2   . promethazine (PHENERGAN) 12.5 MG tablet   Oral   Take 1 tablet (12.5 mg total) by mouth every 6 (six) hours as needed for nausea or vomiting.   12 tablet   0    BP 125/82  Pulse 125  Temp(Src) 101.5 F (38.6 C) (Oral)  Resp 24  SpO2 96% Physical Exam  Constitutional: She appears well-developed and well-nourished. No distress.  Cardiovascular: Regular rhythm.  Tachycardia present.   Pulmonary/Chest: Effort normal and breath sounds normal. She exhibits no tenderness.  Abdominal: Normal appearance and bowel sounds are normal. She exhibits no distension. There is no hepatosplenomegaly. There is tenderness in the right upper quadrant, epigastric area and left upper quadrant. There is no rigidity, no rebound, no guarding and negative Murphy's sign.    ED Course  Procedures (including critical care time) Labs Review Labs Reviewed - No data to display Imaging Review No results found.  EKG Interpretation    Date/Time:    Ventricular Rate:    PR  Interval:    QRS Duration:   QT Interval:    QTC Calculation:   R Axis:     Text Interpretation:              MDM   1. Gastroenteritis   Pt given zofran 4mg  ODT here in ucc, able to drink 2 glasses of water. No further vomiting, reports abd pain resolved.  Still tachycardic, likely dehydrated. Pt wants to go home.  Instructed to push fluids. Rx phenergan 12.5mg  po q6 hours prn nausea.      Cathlyn Parsons, NP 05/03/13 2033

## 2013-05-03 NOTE — ED Provider Notes (Signed)
Medical screening examination/treatment/procedure(s) were performed by a resident physician or non-physician practitioner and as the supervising physician I was immediately available for consultation/collaboration.  Ladene Allocca, MD      Archita Lomeli S Annasophia Crocker, MD 05/03/13 2111 

## 2013-05-03 NOTE — ED Notes (Signed)
C/o chest pain onset 2 days qago and could not catch her breath. Yesterday was weak. Today c/o headache, fever, earache, vomiting x 4 ( twice since she has been here) and lethargy .  C/o upper abdominal pain also

## 2014-12-15 ENCOUNTER — Other Ambulatory Visit (HOSPITAL_COMMUNITY): Payer: Self-pay | Admitting: Orthopaedic Surgery

## 2014-12-19 ENCOUNTER — Encounter (HOSPITAL_COMMUNITY): Payer: Self-pay

## 2014-12-19 ENCOUNTER — Encounter (HOSPITAL_COMMUNITY)
Admission: RE | Admit: 2014-12-19 | Discharge: 2014-12-19 | Disposition: A | Discharge status: Home / Self Care | Payer: 59 | Source: Ambulatory Visit | Attending: Orthopaedic Surgery | Admitting: Orthopaedic Surgery

## 2014-12-19 DIAGNOSIS — Y999 Unspecified external cause status: Secondary | ICD-10-CM | POA: Diagnosis not present

## 2014-12-19 DIAGNOSIS — Y939 Activity, unspecified: Secondary | ICD-10-CM | POA: Diagnosis not present

## 2014-12-19 DIAGNOSIS — K279 Peptic ulcer, site unspecified, unspecified as acute or chronic, without hemorrhage or perforation: Secondary | ICD-10-CM | POA: Diagnosis not present

## 2014-12-19 DIAGNOSIS — S8262XA Displaced fracture of lateral malleolus of left fibula, initial encounter for closed fracture: Secondary | ICD-10-CM | POA: Diagnosis not present

## 2014-12-19 DIAGNOSIS — R51 Headache: Secondary | ICD-10-CM | POA: Diagnosis not present

## 2014-12-19 DIAGNOSIS — Y929 Unspecified place or not applicable: Secondary | ICD-10-CM | POA: Diagnosis not present

## 2014-12-19 DIAGNOSIS — I4510 Unspecified right bundle-branch block: Secondary | ICD-10-CM | POA: Diagnosis not present

## 2014-12-19 DIAGNOSIS — I517 Cardiomegaly: Secondary | ICD-10-CM | POA: Diagnosis not present

## 2014-12-19 DIAGNOSIS — X58XXXA Exposure to other specified factors, initial encounter: Secondary | ICD-10-CM | POA: Diagnosis not present

## 2014-12-19 DIAGNOSIS — K210 Gastro-esophageal reflux disease with esophagitis: Secondary | ICD-10-CM | POA: Diagnosis not present

## 2014-12-19 HISTORY — DX: Headache: R51

## 2014-12-19 HISTORY — DX: Cardiac arrhythmia, unspecified: I49.9

## 2014-12-19 HISTORY — DX: Headache, unspecified: R51.9

## 2014-12-19 LAB — CBC
HCT: 58.8 % — ABNORMAL HIGH (ref 36.0–46.0)
Hemoglobin: 19.5 g/dL — ABNORMAL HIGH (ref 12.0–15.0)
MCH: 27 pg (ref 26.0–34.0)
MCHC: 33.2 g/dL (ref 30.0–36.0)
MCV: 81.6 fL (ref 78.0–100.0)
Platelets: 372 10*3/uL (ref 150–400)
RBC: 7.21 MIL/uL — ABNORMAL HIGH (ref 3.87–5.11)
RDW: 18.6 % — ABNORMAL HIGH (ref 11.5–15.5)
WBC: 10.5 10*3/uL (ref 4.0–10.5)

## 2014-12-19 NOTE — Pre-Procedure Instructions (Signed)
    [facility]  12/19/2014      Your procedure is scheduled on : Wednesday, July 13.  Report to [facility] Admitting at 9:50 A.M.   Call this number if you have problems the morning of surgery:  [phone]   Remember:  Do not eat food or drink liquids after midnight Tuesday,  July 12.  Take these medicines the morning of surgery with A SIP OF WATER : omeprazole.             May take Hydrocodone- Acetaminophen if needed.   Do not wear jewelry, make-up or nail polish.  Do not wear lotions, powders, or perfumes.    Do not shave 48 hours prior to surgery.    Do not bring valuables to the hospital.  [facility] is not responsible for any belongings or valuables.  Contacts, dentures or bridgework may not be worn into surgery.  Leave your suitcase in the car.  After surgery it may be brought to your room.  For patients admitted to the hospital, discharge time will be determined by your treatment team.  Patients discharged the day of surgery will not be allowed to drive home.   Name and phone number of your driver:     Special instructions:  Review  [place] - Preparing For Surgery.  Please read over the following fact sheets that you were given. Pain Booklet, Coughing and Deep Breathing and Surgical Site Infection Prevention

## 2014-12-19 NOTE — Progress Notes (Signed)
Eileen [name] reports a history of palpations, for many years, denies shortness of breath or chest pain when she has  It , just a little butterfly like sensation in my upper abdomen.  Continues to have at times.  Saw [facility] years ago, wore a 24 hour monitor, but they did not see anything.  "Then later they saw, PVC's or something on an EKG here in the ED."  I found an EKG from 03/29/09 that shows PAC's.  ED notes report that patient saw cardiologist in 2005.  I have requested EKG's and last office visit from [place] office.

## 2014-12-19 NOTE — Progress Notes (Signed)
Anesthesia Chart Review:  Pt is 44 year old female scheduled for ORIF L lateral malleolus fracture on 12/21/2014 with Dr. Erlinda Hong.   PMH includes: palpitations, GERD. Never smoker. BMI 44.   Medications include: prilosec.   Preoperative labs reviewed.  H/H 19.5/58.8. Was 15.6/47 in 2013, low or normal in 2011.   EKG: will need to be obtained DOS. Old EKG from 03/29/09 shows PACs.   Reviewed labs with Dr. Lissa Hoard. Will recheck istat 4 DOS.   If labs and EKG acceptable, I anticipate pt can proceed as scheduled.   Willeen Cass, FNP-BC [facility]/Anesthesiology Phone: [phone] 12/19/2014 5:18 PM

## 2014-12-20 MED ORDER — CEFAZOLIN SODIUM-DEXTROSE 2-3 GM-% IV SOLR
2.0000 g | INTRAVENOUS | Status: AC
  Administered 2014-12-21: 2 g via INTRAVENOUS
  Filled 2014-12-20: qty 50

## 2014-12-21 ENCOUNTER — Ambulatory Visit (HOSPITAL_COMMUNITY): Payer: 59 | Admitting: Certified Registered Nurse Anesthetist

## 2014-12-21 ENCOUNTER — Encounter (HOSPITAL_COMMUNITY): Payer: Self-pay | Admitting: *Deleted

## 2014-12-21 ENCOUNTER — Ambulatory Visit (HOSPITAL_COMMUNITY): Payer: 59

## 2014-12-21 ENCOUNTER — Ambulatory Visit (HOSPITAL_COMMUNITY): Payer: 59 | Admitting: Vascular Surgery

## 2014-12-21 ENCOUNTER — Encounter (HOSPITAL_COMMUNITY)
Admission: RE | Disposition: A | Discharge status: Home / Self Care | Payer: Self-pay | Source: Ambulatory Visit | Attending: Orthopaedic Surgery

## 2014-12-21 ENCOUNTER — Ambulatory Visit (HOSPITAL_COMMUNITY)
Admission: RE | Admit: 2014-12-21 | Discharge: 2014-12-21 | Disposition: A | Discharge status: Home / Self Care | Payer: 59 | Source: Ambulatory Visit | Attending: Orthopaedic Surgery | Admitting: Orthopaedic Surgery

## 2014-12-21 DIAGNOSIS — S8262XA Displaced fracture of lateral malleolus of left fibula, initial encounter for closed fracture: Secondary | ICD-10-CM | POA: Insufficient documentation

## 2014-12-21 DIAGNOSIS — Y939 Activity, unspecified: Secondary | ICD-10-CM | POA: Insufficient documentation

## 2014-12-21 DIAGNOSIS — Z419 Encounter for procedure for purposes other than remedying health state, unspecified: Secondary | ICD-10-CM

## 2014-12-21 DIAGNOSIS — X58XXXA Exposure to other specified factors, initial encounter: Secondary | ICD-10-CM | POA: Insufficient documentation

## 2014-12-21 DIAGNOSIS — I517 Cardiomegaly: Secondary | ICD-10-CM | POA: Insufficient documentation

## 2014-12-21 DIAGNOSIS — R51 Headache: Secondary | ICD-10-CM | POA: Insufficient documentation

## 2014-12-21 DIAGNOSIS — K279 Peptic ulcer, site unspecified, unspecified as acute or chronic, without hemorrhage or perforation: Secondary | ICD-10-CM | POA: Insufficient documentation

## 2014-12-21 DIAGNOSIS — K210 Gastro-esophageal reflux disease with esophagitis: Secondary | ICD-10-CM | POA: Diagnosis not present

## 2014-12-21 DIAGNOSIS — Y999 Unspecified external cause status: Secondary | ICD-10-CM | POA: Insufficient documentation

## 2014-12-21 DIAGNOSIS — I4510 Unspecified right bundle-branch block: Secondary | ICD-10-CM | POA: Insufficient documentation

## 2014-12-21 DIAGNOSIS — Y929 Unspecified place or not applicable: Secondary | ICD-10-CM | POA: Insufficient documentation

## 2014-12-21 HISTORY — PX: ORIF ANKLE FRACTURE: SHX5408

## 2014-12-21 LAB — POCT I-STAT 4, (NA,K, GLUC, HGB,HCT)
Glucose, Bld: 101 mg/dL — ABNORMAL HIGH (ref 65–99)
HCT: 64 % — ABNORMAL HIGH (ref 36.0–46.0)
Hemoglobin: 21.8 g/dL (ref 12.0–15.0)
Potassium: 3.9 mmol/L (ref 3.5–5.1)
Sodium: 139 mmol/L (ref 135–145)

## 2014-12-21 LAB — SURGICAL PCR SCREEN
MRSA, PCR: NEGATIVE
Staphylococcus aureus: NEGATIVE

## 2014-12-21 SURGERY — OPEN REDUCTION INTERNAL FIXATION (ORIF) ANKLE FRACTURE
Anesthesia: General | Site: Ankle | Laterality: Left

## 2014-12-21 MED ORDER — MIDAZOLAM HCL 2 MG/2ML IJ SOLN
INTRAMUSCULAR | Status: AC
  Administered 2014-12-21: 2 mg
  Filled 2014-12-21: qty 2

## 2014-12-21 MED ORDER — LACTATED RINGERS IV SOLN
INTRAVENOUS | Status: DC
  Administered 2014-12-21 (×2): via INTRAVENOUS

## 2014-12-21 MED ORDER — FENTANYL CITRATE (PF) 100 MCG/2ML IJ SOLN
INTRAMUSCULAR | Status: DC | PRN
  Administered 2014-12-21: 25 ug via INTRAVENOUS
  Administered 2014-12-21: 50 ug via INTRAVENOUS
  Administered 2014-12-21: 25 ug via INTRAVENOUS

## 2014-12-21 MED ORDER — ASPIRIN EC 325 MG PO TBEC: 325.0000 mg | DELAYED_RELEASE_TABLET | Freq: Two times a day (BID) | ORAL | Status: DC

## 2014-12-21 MED ORDER — FENTANYL CITRATE (PF) 250 MCG/5ML IJ SOLN
INTRAMUSCULAR | Status: AC
  Filled 2014-12-21: qty 5

## 2014-12-21 MED ORDER — PROPOFOL INFUSION 10 MG/ML OPTIME
INTRAVENOUS | Status: DC | PRN
  Administered 2014-12-21: 100 ug/kg/min via INTRAVENOUS

## 2014-12-21 MED ORDER — MIDAZOLAM HCL 2 MG/2ML IJ SOLN
INTRAMUSCULAR | Status: AC
  Filled 2014-12-21: qty 2

## 2014-12-21 MED ORDER — FENTANYL CITRATE (PF) 100 MCG/2ML IJ SOLN
100.0000 ug | Freq: Once | INTRAMUSCULAR | Status: AC
  Administered 2014-12-21: 100 ug via INTRAVENOUS
  Filled 2014-12-21: qty 2

## 2014-12-21 MED ORDER — LIDOCAINE-EPINEPHRINE (PF) 1.5 %-1:200000 IJ SOLN
INTRAMUSCULAR | Status: DC | PRN
  Administered 2014-12-21: 30 mL via PERINEURAL

## 2014-12-21 MED ORDER — PROPOFOL 10 MG/ML IV BOLUS
INTRAVENOUS | Status: AC
  Filled 2014-12-21: qty 20

## 2014-12-21 MED ORDER — MIDAZOLAM HCL 5 MG/ML IJ SOLN: 2.0000 mg | Freq: Once | INTRAMUSCULAR | Status: DC

## 2014-12-21 MED ORDER — MUPIROCIN 2 % EX OINT
1.0000 "application " | TOPICAL_OINTMENT | Freq: Once | CUTANEOUS | Status: AC
  Administered 2014-12-21: 1 via TOPICAL

## 2014-12-21 MED ORDER — BUPIVACAINE-EPINEPHRINE (PF) 0.5% -1:200000 IJ SOLN
INTRAMUSCULAR | Status: DC | PRN
  Administered 2014-12-21: 30 mL
  Administered 2014-12-21: 30 mL via PERINEURAL

## 2014-12-21 MED ORDER — METHOCARBAMOL 750 MG PO TABS: 750.0000 mg | ORAL_TABLET | Freq: Two times a day (BID) | ORAL | Status: DC | PRN

## 2014-12-21 MED ORDER — MIDAZOLAM HCL 5 MG/5ML IJ SOLN
INTRAMUSCULAR | Status: DC | PRN
  Administered 2014-12-21: 2 mg via INTRAVENOUS

## 2014-12-21 MED ORDER — MUPIROCIN 2 % EX OINT
TOPICAL_OINTMENT | CUTANEOUS | Status: AC
  Administered 2014-12-21: 1 via TOPICAL
  Filled 2014-12-21: qty 22

## 2014-12-21 MED ORDER — OXYCODONE-ACETAMINOPHEN 5-325 MG PO TABS: 1.0000 | ORAL_TABLET | ORAL | Status: DC | PRN

## 2014-12-21 MED ORDER — SENNOSIDES-DOCUSATE SODIUM 8.6-50 MG PO TABS: 1.0000 | ORAL_TABLET | Freq: Every evening | ORAL | Status: DC | PRN

## 2014-12-21 SURGICAL SUPPLY — 58 items
BANDAGE ELASTIC 4 VELCRO ST LF (GAUZE/BANDAGES/DRESSINGS) IMPLANT
BANDAGE ELASTIC 6 VELCRO ST LF (GAUZE/BANDAGES/DRESSINGS) IMPLANT
BANDAGE ESMARK 6X9 LF (GAUZE/BANDAGES/DRESSINGS) IMPLANT
BLADE SURG 15 STRL LF DISP TIS (BLADE) ×1 IMPLANT
BLADE SURG 15 STRL SS (BLADE) ×1
BNDG COHESIVE 4X5 TAN STRL (GAUZE/BANDAGES/DRESSINGS) ×2 IMPLANT
BNDG COHESIVE 6X5 TAN STRL LF (GAUZE/BANDAGES/DRESSINGS) IMPLANT
BNDG ESMARK 6X9 LF (GAUZE/BANDAGES/DRESSINGS)
CANISTER SUCT 3000ML PPV (MISCELLANEOUS) ×2 IMPLANT
COVER SURGICAL LIGHT HANDLE (MISCELLANEOUS) ×2 IMPLANT
CUFF TOURNIQUET SINGLE 34IN LL (TOURNIQUET CUFF) IMPLANT
CUFF TOURNIQUET SINGLE 44IN (TOURNIQUET CUFF) IMPLANT
DRAPE C-ARM 42X72 X-RAY (DRAPES) ×2 IMPLANT
DRAPE C-ARMOR (DRAPES) ×2 IMPLANT
DRAPE IMP U-DRAPE 54X76 (DRAPES) ×2 IMPLANT
DRAPE INCISE IOBAN 66X45 STRL (DRAPES) IMPLANT
DRAPE U-SHAPE 47X51 STRL (DRAPES) ×2 IMPLANT
DRILL 2.6X122MM WL AO SHAFT (BIT) ×2 IMPLANT
DURAPREP 26ML APPLICATOR (WOUND CARE) ×2 IMPLANT
ELECT CAUTERY BLADE 6.4 (BLADE) ×2 IMPLANT
ELECT REM PT RETURN 9FT ADLT (ELECTROSURGICAL) ×2
ELECTRODE REM PT RTRN 9FT ADLT (ELECTROSURGICAL) ×1 IMPLANT
FACESHIELD WRAPAROUND (MASK) ×2 IMPLANT
GAUZE SPONGE 4X4 12PLY STRL (GAUZE/BANDAGES/DRESSINGS) ×2 IMPLANT
GAUZE XEROFORM 5X9 LF (GAUZE/BANDAGES/DRESSINGS) ×2 IMPLANT
GLOVE NEODERM STRL 7.5 LF PF (GLOVE) ×1 IMPLANT
GLOVE SURG NEODERM 7.5  LF PF (GLOVE) ×1
GLOVE SURG SYN 7.5  E (GLOVE) ×2
GLOVE SURG SYN 7.5 E (GLOVE) ×2 IMPLANT
GOWN STRL REIN XL XLG (GOWN DISPOSABLE) ×2 IMPLANT
K-WIRE 1.6X150 (WIRE) ×2
K-WIRE FX150X1.6XKRSH (WIRE) ×2
KIT BASIN OR (CUSTOM PROCEDURE TRAY) ×2 IMPLANT
KIT ROOM TURNOVER OR (KITS) ×2 IMPLANT
KWIRE FX150X1.6XKRSH (WIRE) ×2 IMPLANT
NEEDLE HYPO 25GX1X1/2 BEV (NEEDLE) IMPLANT
NS IRRIG 1000ML POUR BTL (IV SOLUTION) ×2 IMPLANT
PACK ORTHO EXTREMITY (CUSTOM PROCEDURE TRAY) ×2 IMPLANT
PAD ARMBOARD 7.5X6 YLW CONV (MISCELLANEOUS) ×4 IMPLANT
PAD CAST 3X4 CTTN HI CHSV (CAST SUPPLIES) ×2 IMPLANT
PADDING CAST COTTON 3X4 STRL (CAST SUPPLIES) ×2
PADDING CAST COTTON 6X4 STRL (CAST SUPPLIES) ×2 IMPLANT
PLATE DISTAL FIBULA 3HOLE (Plate) ×2 IMPLANT
SCREW BONE 14MMX3.5MM (Screw) ×6 IMPLANT
SCREW BONE 3.5X20MM (Screw) ×2 IMPLANT
SCREW BONE 3.5X30MM (Screw) ×2 IMPLANT
SCREW LOCK 3.5X14 (Screw) ×2 IMPLANT
SCREW LOCKING 3.5X16MM (Screw) ×4 IMPLANT
SPONGE LAP 18X18 X RAY DECT (DISPOSABLE) IMPLANT
SUCTION FRAZIER TIP 10 FR DISP (SUCTIONS) ×2 IMPLANT
SUT ETHILON 3 0 PS 1 (SUTURE) IMPLANT
SUT VIC AB 2-0 CT1 27 (SUTURE)
SUT VIC AB 2-0 CT1 TAPERPNT 27 (SUTURE) IMPLANT
SYR CONTROL 10ML LL (SYRINGE) IMPLANT
TOWEL OR 17X24 6PK STRL BLUE (TOWEL DISPOSABLE) ×2 IMPLANT
TOWEL OR 17X26 10 PK STRL BLUE (TOWEL DISPOSABLE) ×4 IMPLANT
TUBE CONNECTING 12X1/4 (SUCTIONS) ×2 IMPLANT
WATER STERILE IRR 1000ML POUR (IV SOLUTION) ×2 IMPLANT

## 2014-12-21 NOTE — H&P (Signed)
    PREOPERATIVE H&P  Chief Complaint: left lateral malleolus fracture  HPI: Eileen [name] is a 44 y.o. female who presents for surgical treatment of left lateral malleolus fracture.  She denies any changes in medical history.  Past Medical History  Diagnosis Date  . GERD (gastroesophageal reflux disease)   . Reflux esophagitis   . Acute duodenal ulcer without mention of hemorrhage, perforation, or obstruction   . Headache     takes Advil  . Dysrhythmia     History of palpations, for many years   Past Surgical History  Procedure Laterality Date  . Cesarean section      x 3   . Abdominal hysterectomy  2011ish  . Upper gi endoscopy     History   Social History  . Marital Status: Married    Spouse Name: N/A  . Number of Children: 3  . Years of Education: N/A   Occupational History  . [employer]   Social History Main Topics  . Smoking status: Never Smoker   . Smokeless tobacco: Never Used  . Alcohol Use: No  . Drug Use: No  . Sexual Activity: Yes    Birth Control/ Protection: Surgical   Other Topics Concern  . Not on file   Social History Narrative   No caffeine    Family History  Problem Relation Age of Onset  . Colon cancer Neg Hx   . Breast cancer      [place]    No Known Allergies Prior to Admission medications   Medication Sig Start Date End Date Taking? Authorizing Provider  HYDROcodone-acetaminophen (NORCO/VICODIN) 5-325 MG per tablet Take 1 tablet by mouth every 6 (six) hours as needed (for pain).   Yes Historical Provider, MD  omeprazole (PRILOSEC OTC) 20 MG tablet Take 20 mg by mouth daily as needed (for indigestion).   Yes Historical Provider, MD  ibuprofen (ADVIL,MOTRIN) 200 MG tablet Take 600 mg by mouth every 6 (six) hours as needed.    Historical Provider, MD     Positive ROS: All other systems have been reviewed and were otherwise negative with the exception of those mentioned in the HPI and as  above.  Physical Exam: General: Alert, no acute distress Cardiovascular: No pedal edema Respiratory: No cyanosis, no use of accessory musculature GI: abdomen soft Skin: No lesions in the area of chief complaint Neurologic: Sensation intact distally Psychiatric: Patient is competent for consent with normal mood and affect Lymphatic: no lymphedema  MUSCULOSKELETAL: exam stable  Assessment: left lateral malleolus fracture  Plan: Plan for Procedure(s): OPEN REDUCTION INTERNAL FIXATION (ORIF) LEFT LATERAL MALLEOLUS ANKLE FRACTURE  The risks benefits and alternatives were discussed with the patient including but not limited to the risks of nonoperative treatment, versus surgical intervention including infection, bleeding, nerve injury,  blood clots, cardiopulmonary complications, morbidity, mortality, among others, and they were willing to proceed.   Marianna Payment, MD   12/21/2014 7:13 AM

## 2014-12-21 NOTE — Transfer of Care (Signed)
Immediate Anesthesia Transfer of Care Note  Patient: Eileen [name]  Procedure(s) Performed: Procedure(s) with comments: OPEN REDUCTION INTERNAL FIXATION (ORIF) LEFT LATERAL MALLEOLUS ANKLE FRACTURE (Left) - Needs RNFA  Patient Location: PACU  Anesthesia Type:MAC and MAC combined with regional for post-op pain  Level of Consciousness: awake, alert , oriented, patient cooperative and responds to stimulation  Airway & Oxygen Therapy: Patient Spontanous Breathing and Patient connected to nasal cannula oxygen  Post-op Assessment: Report given to RN, Post -op Vital signs reviewed and stable, Patient moving all extremities X 4 and Patient able to stick tongue midline  Post vital signs: stable  Last Vitals:  Filed Vitals:   12/21/14 1140  BP: 153/95  Pulse: 114  Temp:   Resp: 24    Complications: No apparent anesthesia complications

## 2014-12-21 NOTE — Op Note (Signed)
   Date of Surgery: 12/21/2014  INDICATIONS: Eileen [name] is a 44 y.o.-year-old female who sustained a left ankle fracture; she was indicated for open reduction and internal fixation due to the displaced nature of the articular fracture and came to the operating room today for this procedure. The patient did consent to the procedure after discussion of the risks and benefits.  PREOPERATIVE DIAGNOSIS: left lateral malleolus ankle fracture  POSTOPERATIVE DIAGNOSIS: Same.  PROCEDURE: Open treatment of left ankle fracture with internal fixation. Lateral malleolar CPT [phone]  SURGEON: N. Eduard Roux, M.D.  ASSIST: April Green, RNFA.  ANESTHESIA:  regional  TOURNIQUET TIME: 22 minutes  IV FLUIDS AND URINE: See anesthesia.  ESTIMATED BLOOD LOSS: minimal mL.  IMPLANTS: St[employer]ker Variax 3 hole distal fibula plate  COMPLICATIONS: None.  DESCRIPTION OF PROCEDURE: The patient was brought to the operating room and placed supine on the operating table.  The patient had been signed prior to the procedure and this was documented. The patient had the anesthesia placed by the anesthesiologist.  A nonsterile tourniquet was placed on the upper thigh.  The prep verification and incision time-outs were performed to confirm that this was the correct patient, site, side and location. The patient had an SCD on the opposite lower extremity. The patient did receive antibiotics prior to the incision and was re-dosed during the procedure as needed at indicated intervals.  The patient had the lower extremity prepped and draped in the standard surgical fashion.  The extremity was exsanguinated using an esmarch bandage and the tourniquet was inflated to 300 mm Hg.  A lateral incision was made over the distal fibula.  Full thickness flaps were created.  The superficial peroneal nerve was not encountered.  The fibula was exposed.  The fracture was cleared of organized hematoma and periosteum.  The fracture was reduced and held  with a tenaculum clamp.  A lag screw was placed.  A 3 hole plate was placed at the appropriate position and confirmed on fluoroscopy.  We then placed 3 nonlocking screws above the fracture and 3 unicortical locking screws distally.  An external rotation stress test was performed and the medial clear space was normal.  Final xrays were then taken.  The wound was thoroughly irrigated and closed with 0 vicryl, 2.0 vicryl, 3.0 nylon.  Sterile dressings were applied and a short leg splint was placed.  Patient tolerated the procedure well.    POSTOPERATIVE PLAN: Eileen [name] will remain nonweightbearing on this leg for approximately 6 weeks; Eileen [name] will return for suture removal in 2 weeks.  He will be immobilized in a short leg splint and then transitioned to a CAM walker at his first follow up appointment.  Eileen [name] will receive DVT prophylaxis based on other medications, activity level, and risk ratio of bleeding to thrombosis.  Eileen Cecil, MD [place] 12:48 PM

## 2014-12-21 NOTE — Progress Notes (Signed)
CRNA at bedside, pt to OR. 

## 2014-12-21 NOTE — Discharge Instructions (Signed)
° ° °  1. Keep splint clean and dry °2. Elevate foot above level of the heart °3. Take aspirin to prevent blood clots °4. Take pain meds as needed °5. Strict non weight bearing to operative extremity ° °

## 2014-12-21 NOTE — Anesthesia Procedure Notes (Signed)
Anesthesia Regional Block:  Popliteal block  Pre-Anesthetic Checklist: ,, timeout performed, Correct Patient, Correct Site, Correct Laterality, Correct Procedure, Correct Position, site marked, Risks and benefits discussed,  Surgical consent,  Pre-op evaluation,  At surgeon's request and post-op pain management  Laterality: Left  Prep: chloraprep       Needles:  Injection technique: Single-shot  Needle Type: Echogenic Stimulator Needle     Needle Length: 10cm 10 cm     Additional Needles:  Procedures: ultrasound guided (picture in chart) and nerve stimulator Popliteal block  Nerve Stimulator or Paresthesia:  Response: 0.4 mA,   Additional Responses:   Narrative:  Start time: 12/21/2014 11:15 AM End time: 12/21/2014 11:30 AM Injection made incrementally with aspirations every 5 mL.  Performed by: Personally  Anesthesiologist: Lillia Abed  Additional Notes: Monitors applied. Patient sedated. Sterile prep and drape,hand hygiene and sterile gloves were used. Relevant anatomy identified.Needle position confirmed.Local anesthetic injected incrementally after negative aspiration. Local anesthetic spread visualized around nerve(s). Vascular puncture avoided. No complications. Image printed for medical record.The patient tolerated the procedure well.  Additional Saphenous nerve block performed. 15cc Local Anesthetic mixture placed under ultrasonic guidance along the medio-inferior border of the Sartorious muscle 6 inches above the knee.  No Problems encountered.  Lillia Abed MD

## 2014-12-21 NOTE — Anesthesia Preprocedure Evaluation (Signed)
Anesthesia Evaluation  Patient identified by MRN, date of birth, ID band Patient awake    Reviewed: Allergy & Precautions, NPO status , Patient's Chart, lab work & pertinent test results  Airway Mallampati: I  TM Distance: >3 FB Neck ROM: Full    Dental   Pulmonary    Pulmonary exam normal       Cardiovascular Normal cardiovascular exam    Neuro/Psych    GI/Hepatic PUD, GERD-  Medicated and Controlled,  Endo/Other    Renal/GU      Musculoskeletal   Abdominal   Peds  Hematology   Anesthesia Other Findings   Reproductive/Obstetrics                             Anesthesia Physical Anesthesia Plan  ASA: II  Anesthesia Plan: Regional   Post-op Pain Management:    Induction: Intravenous  Airway Management Planned: Natural Airway  Additional Equipment:   Intra-op Plan:   Post-operative Plan:   Informed Consent: I have reviewed the patients History and Physical, chart, labs and discussed the procedure including the risks, benefits and alternatives for the proposed anesthesia with the patient or authorized representative who has indicated his/her understanding and acceptance.     Plan Discussed with: CRNA and Surgeon  Anesthesia Plan Comments:         Anesthesia Quick Evaluation

## 2014-12-22 ENCOUNTER — Encounter (HOSPITAL_COMMUNITY): Payer: Self-pay | Admitting: Orthopaedic Surgery

## 2014-12-22 NOTE — Anesthesia Postprocedure Evaluation (Signed)
Anesthesia Post Note  Patient: Eileen [name]  Procedure(s) Performed: Procedure(s) (LRB): OPEN REDUCTION INTERNAL FIXATION (ORIF) LEFT LATERAL MALLEOLUS ANKLE FRACTURE (Left)  Anesthesia type: general  Patient location: PACU  Post pain: Pain level controlled  Post assessment: Patient's Cardiovascular Status Stable  Last Vitals:  Filed Vitals:   12/21/14 1432  BP: 139/85  Pulse: 100  Temp:   Resp:     Post vital signs: Reviewed and stable  Level of consciousness: sedated  Complications: No apparent anesthesia complications

## 2015-06-29 ENCOUNTER — Other Ambulatory Visit: Payer: Self-pay | Admitting: Orthopaedic Surgery

## 2015-06-29 DIAGNOSIS — M25572 Pain in left ankle and joints of left foot: Secondary | ICD-10-CM

## 2015-07-09 ENCOUNTER — Ambulatory Visit
Admission: RE | Admit: 2015-07-09 | Discharge: 2015-07-09 | Disposition: A | Discharge status: Home / Self Care | Payer: 59 | Source: Ambulatory Visit | Attending: Orthopaedic Surgery | Admitting: Orthopaedic Surgery

## 2015-07-09 DIAGNOSIS — M25572 Pain in left ankle and joints of left foot: Secondary | ICD-10-CM

## 2015-11-16 ENCOUNTER — Encounter (HOSPITAL_COMMUNITY): Payer: Self-pay | Admitting: Oncology

## 2015-11-16 ENCOUNTER — Emergency Department (HOSPITAL_COMMUNITY)
Admission: EM | Admit: 2015-11-16 | Discharge: 2015-11-17 | Disposition: A | Discharge status: Home / Self Care | Payer: 59 | Attending: Emergency Medicine | Admitting: Emergency Medicine

## 2015-11-16 DIAGNOSIS — Z79899 Other long term (current) drug therapy: Secondary | ICD-10-CM | POA: Diagnosis not present

## 2015-11-16 DIAGNOSIS — R109 Unspecified abdominal pain: Secondary | ICD-10-CM | POA: Diagnosis not present

## 2015-11-16 DIAGNOSIS — M545 Low back pain: Secondary | ICD-10-CM | POA: Diagnosis present

## 2015-11-16 NOTE — ED Notes (Signed)
Pt reports standing in the front yard in [place], walked up three stairs and developed severe lower mid back pain.  10/10, sharp in nature.  Denies injury.  Pt is tachycardic and hypertensive in triage.  +nausea.

## 2015-11-17 ENCOUNTER — Emergency Department (HOSPITAL_COMMUNITY): Payer: 59

## 2015-11-17 LAB — URINALYSIS, ROUTINE W REFLEX MICROSCOPIC
Glucose, UA: NEGATIVE mg/dL
Hgb urine dipstick: NEGATIVE
Ketones, ur: NEGATIVE mg/dL
Leukocytes, UA: NEGATIVE
Nitrite: NEGATIVE
Protein, ur: NEGATIVE mg/dL
Specific Gravity, Urine: 1.028 (ref 1.005–1.030)
pH: 5 (ref 5.0–8.0)

## 2015-11-17 LAB — CBC WITH DIFFERENTIAL/PLATELET
Basophils Absolute: 0.1 10*3/uL (ref 0.0–0.1)
Basophils Relative: 1 %
Eosinophils Absolute: 0.3 10*3/uL (ref 0.0–0.7)
Eosinophils Relative: 2 %
HCT: 59.6 % — ABNORMAL HIGH (ref 36.0–46.0)
Hemoglobin: 19.7 g/dL — ABNORMAL HIGH (ref 12.0–15.0)
Lymphocytes Relative: 8 %
Lymphs Abs: 1.3 10*3/uL (ref 0.7–4.0)
MCH: 28 pg (ref 26.0–34.0)
MCHC: 33.1 g/dL (ref 30.0–36.0)
MCV: 84.7 fL (ref 78.0–100.0)
Monocytes Absolute: 0.8 10*3/uL (ref 0.1–1.0)
Monocytes Relative: 5 %
Neutro Abs: 14 10*3/uL — ABNORMAL HIGH (ref 1.7–7.7)
Neutrophils Relative %: 85 %
Platelets: 503 10*3/uL — ABNORMAL HIGH (ref 150–400)
RBC: 7.04 MIL/uL — ABNORMAL HIGH (ref 3.87–5.11)
RDW: 17.1 % — ABNORMAL HIGH (ref 11.5–15.5)
WBC: 16.6 10*3/uL — ABNORMAL HIGH (ref 4.0–10.5)

## 2015-11-17 LAB — HEPATIC FUNCTION PANEL
ALT: 23 U/L (ref 14–54)
AST: 54 U/L — ABNORMAL HIGH (ref 15–41)
Albumin: 4 g/dL (ref 3.5–5.0)
Alkaline Phosphatase: 86 U/L (ref 38–126)
Bilirubin, Direct: 0.8 mg/dL — ABNORMAL HIGH (ref 0.1–0.5)
Indirect Bilirubin: 0.9 mg/dL (ref 0.3–0.9)
Total Bilirubin: 1.7 mg/dL — ABNORMAL HIGH (ref 0.3–1.2)
Total Protein: 6.6 g/dL (ref 6.5–8.1)

## 2015-11-17 LAB — BASIC METABOLIC PANEL
Anion gap: 9 (ref 5–15)
BUN: 12 mg/dL (ref 6–20)
CO2: 23 mmol/L (ref 22–32)
Calcium: 9.4 mg/dL (ref 8.9–10.3)
Chloride: 105 mmol/L (ref 101–111)
Creatinine, Ser: 0.83 mg/dL (ref 0.44–1.00)
GFR calc Af Amer: >60 mL/min (ref >60)
GFR calc non Af Amer: >60 mL/min (ref >60)
Glucose, Bld: 114 mg/dL — ABNORMAL HIGH (ref 65–99)
Potassium: 4.3 mmol/L (ref 3.5–5.1)
Sodium: 137 mmol/L (ref 135–145)

## 2015-11-17 LAB — LIPASE, BLOOD: Lipase: 21 U/L (ref 11–51)

## 2015-11-17 MED ORDER — HYDROCODONE-ACETAMINOPHEN 5-325 MG PO TABS: 1.0000 | ORAL_TABLET | Freq: Four times a day (QID) | ORAL | Status: DC | PRN

## 2015-11-17 MED ORDER — SODIUM CHLORIDE 0.9 % IV BOLUS (SEPSIS)
1000.0000 mL | Freq: Once | INTRAVENOUS | Status: AC
  Administered 2015-11-17: 1000 mL via INTRAVENOUS

## 2015-11-17 MED ORDER — FENTANYL CITRATE (PF) 100 MCG/2ML IJ SOLN
50.0000 ug | INTRAMUSCULAR | Status: DC | PRN
  Administered 2015-11-17: 50 ug via INTRAVENOUS
  Filled 2015-11-17: qty 2

## 2015-11-17 MED ORDER — ONDANSETRON HCL 4 MG/2ML IJ SOLN
4.0000 mg | Freq: Once | INTRAMUSCULAR | Status: AC
  Administered 2015-11-17: 4 mg via INTRAVENOUS
  Filled 2015-11-17: qty 2

## 2015-11-17 MED ORDER — MORPHINE SULFATE (PF) 4 MG/ML IV SOLN
4.0000 mg | Freq: Once | INTRAVENOUS | Status: AC
  Administered 2015-11-17: 4 mg via INTRAVENOUS
  Filled 2015-11-17: qty 1

## 2015-11-17 NOTE — ED Notes (Signed)
Patient transported to Ultrasound 

## 2015-11-17 NOTE — ED Provider Notes (Signed)
Care assumed from Dr. Rex Kras at shift change. Patient started with severe pain in her right flank yesterday evening which worsened through the night. She had a CT scan performed which was unremarkable and laboratory studies were also unremarkable with the exception of a leukocytosis of 16,000. An ultrasound was ordered to rule out gallbladder pathology and care signed out to me awaiting the results of this test. It returned negative for gallstones or biliary issues.  She tells me she is more comfortable. She was reexamined and her abdomen is benign. She will be discharged with pain medication and when necessary return.  Veryl Speak, MD 11/17/15 [id number]

## 2015-11-17 NOTE — Discharge Instructions (Signed)
Ibuprofen 600 mg every 6 hours as needed for pain. Hydrocodone as prescribed as needed for pain not relieved with ibuprofen.  Return to the emergency department if you develop worsening pain, high fever, or other new and concerning symptoms.   Flank Pain Flank pain refers to pain that is located on the side of the body between the upper abdomen and the back. The pain may occur over a short period of time (acute) or may be long-term or reoccurring (chronic). It may be mild or severe. Flank pain can be caused by many things. CAUSES  Some of the more common causes of flank pain include:  Muscle strains.   Muscle spasms.   A disease of your spine (vertebral disk disease).   A lung infection (pneumonia).   Fluid around your lungs (pulmonary edema).   A kidney infection.   Kidney stones.   A very painful skin rash caused by the chickenpox virus (shingles).   Gallbladder disease.  [place] care will depend on the cause of your pain. In general,  Rest as directed by your caregiver.  Drink enough fluids to keep your urine clear or pale yellow.  Only take over-the-counter or prescription medicines as directed by your caregiver. Some medicines may help relieve the pain.  Tell your caregiver about any changes in your pain.  Follow up with your caregiver as directed. SEEK IMMEDIATE MEDICAL CARE IF:   Your pain is not controlled with medicine.   You have new or worsening symptoms.  Your pain increases.   You have abdominal pain.   You have shortness of breath.   You have persistent nausea or vomiting.   You have swelling in your abdomen.   You feel faint or pass out.   You have blood in your urine.  You have a fever or persistent symptoms for more than 2-3 days.  You have a fever and your symptoms suddenly get worse. MAKE SURE YOU:   Understand these instructions.  Will watch your condition.  Will get help right away if you are  not doing well or get worse.   This information is not intended to replace advice given to you by your health care provider. Make sure you discuss any questions you have with your health care provider.   Document Released: 07/18/2005 Document Revised: 02/19/2012 Document Reviewed: 01/09/2012 Elsevier Interactive Patient Education [employer].

## 2015-11-17 NOTE — ED Provider Notes (Addendum)
CSN: [id number]     Arrival date & time 11/16/15  2343 History   First MD Initiated Contact with Patient 11/17/15 [phone]     Chief Complaint  Patient presents with  . Back Pain     (Consider location/radiation/quality/duration/timing/severity/associated sxs/prior Treatment) HPI Comments: 45 year old female who presents with right back pain. The patient states that this evening, she was standing in her yard and walked up 3 steps. She had a sudden onset of right mid to lower back pain. The pain is constant, 10/10 in intensity, and sharp. She has not been able to get comfortable because of the pain. She denies any injuries, fall, or recent change in physical activity. She  Patient is a 45 y.o. female presenting with back pain. The history is provided by the patient.  Back Pain   Past Medical History  Diagnosis Date  . GERD (gastroesophageal reflux disease)   . Reflux esophagitis   . Acute duodenal ulcer without mention of hemorrhage, perforation, or obstruction   . Headache     takes Advil  . Dysrhythmia     History of palpations, for many years   Past Surgical History  Procedure Laterality Date  . Cesarean section      x 3   . Abdominal hysterectomy  2011ish  . Upper gi endoscopy    . Orif ankle fracture Left 12/21/2014    Procedure: OPEN REDUCTION INTERNAL FIXATION (ORIF) LEFT LATERAL MALLEOLUS ANKLE FRACTURE;  Surgeon: Leandrew Koyanagi, MD;  Location: [place];  Service: Orthopedics;  Laterality: Left;  Needs RNFA   Family History  Problem Relation Age of Onset  . Colon cancer Neg Hx   . Breast cancer      [place]    Social History  Substance Use Topics  . Smoking status: Never Smoker   . Smokeless tobacco: Never Used  . Alcohol Use: No   OB History    No data available     Review of Systems  Musculoskeletal: Positive for back pain.   10 Systems reviewed and are negative for acute change except as noted in the HPI.   Allergies  Review of patient's allergies indicates  no known allergies.  Home Medications   Prior to Admission medications   Medication Sig Start Date End Date Taking? Authorizing Provider  omeprazole (PRILOSEC OTC) 20 MG tablet Take 20 mg by mouth daily as needed (for indigestion).   Yes Historical Provider, MD  aspirin EC 325 MG tablet Take 1 tablet (325 mg total) by mouth 2 (two) times daily. Patient not taking: Reported on 11/17/2015 12/21/14   Leandrew Koyanagi, MD  senna-docusate (SENOKOT S) 8.6-50 MG per tablet Take 1 tablet by mouth at bedtime as needed. Patient not taking: Reported on 11/17/2015 12/21/14   Leandrew Koyanagi, MD   BP 125/83 mmHg  Pulse 81  Temp(Src) 98.6 F (37 C) (Oral)  Resp 22  Ht 5\' 4"  (1.626 m)  Wt 240 lb (108.863 kg)  BMI 41.18 kg/m2  SpO2 95% Physical Exam  Constitutional: She is oriented to person, place, and time. She appears well-developed and well-nourished.  uncomfortable  HENT:  Head: Normocephalic and atraumatic.  Moist mucous membranes  Eyes: Conjunctivae are normal. Pupils are equal, round, and reactive to light.  Neck: Neck supple.  Cardiovascular: Normal rate, regular rhythm and normal heart sounds.   No murmur heard. Pulmonary/Chest: Effort normal and breath sounds normal.  Abdominal: Soft. Bowel sounds are normal. She exhibits no distension. There is no  tenderness. There is negative Murphy's sign.  Genitourinary:  No CVA tenderness  Musculoskeletal: She exhibits no edema.  Neurological: She is alert and oriented to person, place, and time.  Fluent speech  Skin: Skin is warm and dry.  Psychiatric: She has a normal mood and affect. Judgment normal.  Nursing note and vitals reviewed.   ED Course  Procedures (including critical care time) Labs Review Labs Reviewed  URINALYSIS, ROUTINE W REFLEX MICROSCOPIC (NOT AT [facility]) - Abnormal; Notable for the following:    Color, Urine AMBER (*)    APPearance CLOUDY (*)    Bilirubin Urine SMALL (*)    All other components within normal limits  BASIC  METABOLIC PANEL - Abnormal; Notable for the following:    Glucose, Bld 114 (*)    All other components within normal limits  CBC WITH DIFFERENTIAL/PLATELET - Abnormal; Notable for the following:    WBC 16.6 (*)    RBC 7.04 (*)    Hemoglobin 19.7 (*)    HCT 59.6 (*)    RDW 17.1 (*)    Platelets 503 (*)    Neutro Abs 14.0 (*)    All other components within normal limits  HEPATIC FUNCTION PANEL - Abnormal; Notable for the following:    AST 54 (*)    Total Bilirubin 1.7 (*)    Bilirubin, Direct 0.8 (*)    All other components within normal limits  URINE CULTURE  LIPASE, BLOOD    Imaging Review Ct Renal Stone Study  11/17/2015  CLINICAL DATA:  Sudden onset right flank pain with nausea. EXAM: CT ABDOMEN AND PELVIS WITHOUT CONTRAST TECHNIQUE: Multidetector CT imaging of the abdomen and pelvis was performed following the standard protocol without IV contrast. COMPARISON:  04/25/2006 FINDINGS: Lower chest and abdominal wall:  No contributory findings. Hepatobiliary: No focal liver abnormality.No evidence of biliary obstruction or stone. Pancreas: Unremarkable. Spleen: Mild splenomegaly with 13 cm AP span and 54 mm thickness. No focal abnormality. Adrenals/Urinary Tract: Negative adrenals. No hydronephrosis or stone. Unremarkable collapsed bladder. Reproductive:Hysterectomy with negative adnexae. Stomach/Bowel:  No obstruction. No appendicitis. Vascular/Lymphatic: No acute vascular abnormality. No mass or adenopathy. Peritoneal: Bilateral L3 and L4 pars defects with grade 1 anterolisthesis at L4-5. Musculoskeletal: No acute abnormalities. IMPRESSION: 1. No acute finding.  No hydronephrosis or urolithiasis. 2. Mild and nonspecific splenomegaly. 3. 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 Electronically Signed   By: Monte Fantasia M.D.   On: 11/17/2015 05:56   I have personally reviewed and evaluated these lab results as part of my medical decision-making.   EKG Interpretation None      Medications  ondansetron (ZOFRAN) injection 4 mg (4 mg Intravenous Given 11/17/15 0504)  sodium chloride 0.9 % bolus 1,000 mL (0 mLs Intravenous Stopped 11/17/15 0713)  morphine 4 MG/ML injection 4 mg (4 mg Intravenous Given 11/17/15 0504)    MDM   Final diagnoses:  Right flank pain   Patient presents with sudden onset of right flank pain, no trauma or recent physical activity. On exam she was uncomfortable, initially tachycardic at triage but normal vital signs during my examination. No abdominal tenderness on exam, specifically no right upper quadrant tenderness. Obtained above lab work as well as CT renal stone study given sudden onset of her symptoms.Gave the patient Zofran, morphine, and IV fluid bolus.  CT was negative for acute process. Lab work showed UA without signs of infection or blood, normal creatinine, WBC 16.6. AST slightly elevated at 54, total bili  1.7. Because of leukocytosis and slightly abnormal LFTs, I have ordered a right upper quadrant ultrasound. I am signing out to the oncoming provider who will follow up on ultrasound results. Patient's disposition is pending completion of study.  Sharlett Iles, MD 11/17/15 [place], MD 12/05/15 [phone]

## 2015-11-17 NOTE — ED Notes (Signed)
Bed: WA05 Expected date:  Expected time:  Means of arrival:  Comments: 

## 2015-11-18 LAB — URINE CULTURE

## 2016-02-02 ENCOUNTER — Encounter: Payer: Self-pay | Admitting: Oncology

## 2016-02-02 ENCOUNTER — Telehealth: Payer: Self-pay | Admitting: Oncology

## 2016-02-02 NOTE — Telephone Encounter (Signed)
Pt confirmed appt., completed intake, mailed new pt letter, faxed referring provider appt date/time

## 2016-02-20 ENCOUNTER — Telehealth: Payer: Self-pay | Admitting: Oncology

## 2016-02-20 ENCOUNTER — Ambulatory Visit (HOSPITAL_BASED_OUTPATIENT_CLINIC_OR_DEPARTMENT_OTHER): Payer: 59 | Admitting: Oncology

## 2016-02-20 ENCOUNTER — Ambulatory Visit (HOSPITAL_BASED_OUTPATIENT_CLINIC_OR_DEPARTMENT_OTHER): Payer: 59

## 2016-02-20 VITALS — BP 162/105 | HR 107 | Temp 98.4°F | Resp 21 | Wt 240.4 lb

## 2016-02-20 DIAGNOSIS — D45 Polycythemia vera: Secondary | ICD-10-CM | POA: Insufficient documentation

## 2016-02-20 LAB — COMPREHENSIVE METABOLIC PANEL
ALT: 13 U/L (ref 0–55)
AST: 13 U/L (ref 5–34)
Albumin: 4.1 g/dL (ref 3.5–5.0)
Alkaline Phosphatase: 115 U/L (ref 40–150)
Anion Gap: 10 mEq/L (ref 3–11)
BUN: 11.1 mg/dL (ref 7.0–26.0)
CO2: 25 mEq/L (ref 22–29)
Calcium: 9.8 mg/dL (ref 8.4–10.4)
Chloride: 105 mEq/L (ref 98–109)
Creatinine: 1 mg/dL (ref 0.6–1.1)
EGFR: 69 mL/min/{1.73_m2} — ABNORMAL LOW (ref >90)
Glucose: 154 mg/dl — ABNORMAL HIGH (ref 70–140)
Potassium: 4.1 mEq/L (ref 3.5–5.1)
Sodium: 140 mEq/L (ref 136–145)
Total Bilirubin: 0.71 mg/dL (ref 0.20–1.20)
Total Protein: 8 g/dL (ref 6.4–8.3)

## 2016-02-20 LAB — CBC WITH DIFFERENTIAL/PLATELET
BASO%: 3.5 % — ABNORMAL HIGH (ref 0.0–2.0)
Basophils Absolute: 0.3 10*3/uL — ABNORMAL HIGH (ref 0.0–0.1)
EOS%: 5 % (ref 0.0–7.0)
Eosinophils Absolute: 0.5 10*3/uL (ref 0.0–0.5)
HCT: 61.9 % — ABNORMAL HIGH (ref 34.8–46.6)
HGB: 19.9 g/dL — ABNORMAL HIGH (ref 11.6–15.9)
LYMPH%: 11.1 % — ABNORMAL LOW (ref 14.0–49.7)
MCH: 28 pg (ref 25.1–34.0)
MCHC: 32.2 g/dL (ref 31.5–36.0)
MCV: 87.1 fL (ref 79.5–101.0)
MONO#: 0.4 10*3/uL (ref 0.1–0.9)
MONO%: 3.8 % (ref 0.0–14.0)
NEUT#: 7.3 10*3/uL — ABNORMAL HIGH (ref 1.5–6.5)
NEUT%: 76.6 % (ref 38.4–76.8)
Platelets: 562 10*3/uL — ABNORMAL HIGH (ref 145–400)
RBC: 7.1 10*6/uL — ABNORMAL HIGH (ref 3.70–5.45)
RDW: 21.4 % — ABNORMAL HIGH (ref 11.2–14.5)
WBC: 9.5 10*3/uL (ref 3.9–10.3)
lymph#: 1 10*3/uL (ref 0.9–3.3)

## 2016-02-20 NOTE — Progress Notes (Signed)
Reason for Referral: Polycythemia.   HPI: 45 year old woman currently of [place] where she lived the majority of her life. She is in generally good health but does have history of mild obesity, GERD and migraine headaches. She noted to have intermittent myalgias in her upper and lower extremities and was evaluated by her primary care provider for the symptoms. A CBC obtained at that time and a hemoglobin was noted to be 19.2 with the upper limit of normal 15.9. Her white cell count was normal at 7.5 and platelet count slightly high of 392. She had normal differential at that time. Her electrolytes, kidney function were all within normal range. Her alkaline phosphatase was 118 and normal is 117. Her C-reactive protein was elevated 11.8. Previous counts dating back to 2011 were reviewed and showed she had a normal hemoglobin of 05/11/2012 with a hemoglobin of 15.6. In 2016 her hemoglobin was 19.5. In July 2016 his CBC was repeated and showed her hemoglobin is high as 21. In June 2017 and her hemoglobin was 19.7. This on these findings she was referred to me for evaluation. Clinically he has very little symptoms at this time. She does report occasional migraine headaches but no chest pain, or dyspnea on exertion. She does not report any angina but does report occasional palpitation. She does not report any bleeding or thrombotic episodes. She denied any constitutional symptoms of weight loss or appetite changes. She denied any hematuria or early satiety. She continues to work full-time without any decline.  She declined toxic fume exposure, smoking or hormonal exposure. She denied any travel to higher elevation. She denied any changes in her mental status.  She does not report any blurry vision, syncope or seizures. She denied any fevers, chills, sweats or joint swelling. She does report myalgias as described. She does not report any chest pain, orthopnea or leg edema. She does not report any cough, wheezing  or hemoptysis. She does not report any nausea, vomiting, abdominal pain, hematochezia or melena. She does not report any frequency urgency or hesitancy. She does not report any pathological fractures. She does not report any lymphadenopathy or petechiae. Remaining review of systems unremarkable.   Past Medical History:  Diagnosis Date  . Acute duodenal ulcer without mention of hemorrhage, perforation, or obstruction   . Dysrhythmia    History of palpations, for many years  . GERD (gastroesophageal reflux disease)   . Headache    takes Advil  . Reflux esophagitis   :  Past Surgical History:  Procedure Laterality Date  . ABDOMINAL HYSTERECTOMY  2011ish  . CESAREAN SECTION     x 3   . ORIF ANKLE FRACTURE Left 12/21/2014   Procedure: OPEN REDUCTION INTERNAL FIXATION (ORIF) LEFT LATERAL MALLEOLUS ANKLE FRACTURE;  Surgeon: Leandrew Koyanagi, MD;  Location: [place];  Service: Orthopedics;  Laterality: Left;  Needs RNFA  . UPPER GI ENDOSCOPY    :   Current Outpatient Prescriptions:  .  aspirin EC 325 MG tablet, Take 1 tablet (325 mg total) by mouth 2 (two) times daily., Disp: 84 tablet, Rfl: 0 .  traMADol (ULTRAM) 50 MG tablet, Take 50 mg by mouth every 6 (six) hours as needed for moderate pain., Disp: , Rfl: :  No Known Allergies:  Family History  Problem Relation Age of Onset  . Colon cancer Neg Hx   . Breast cancer      [place]   :  Social History   Social History  . Marital status: Married  Spouse name: N/A  . Number of children: 3  . Years of education: N/A   Occupational History  . [employer]   Social History Main Topics  . Smoking status: Never Smoker  . Smokeless tobacco: Never Used  . Alcohol use No  . Drug use: No  . Sexual activity: Yes    Birth control/ protection: Surgical   Other Topics Concern  . Not on file   Social History Narrative   No caffeine   :  Pertinent items are noted in HPI.  Exam: Blood pressure (!) 162/105, pulse  (!) 107, temperature 98.4 F (36.9 C), temperature source Oral, resp. rate (!) 21, weight 240 lb 6 oz (109 kg), SpO2 99 %. General appearance: alert and cooperative Head: Normocephalic, without obvious abnormality Throat: lips, mucosa, and tongue normal; teeth and gums normal Neck: no adenopathy Back: negative Resp: clear to auscultation bilaterally Chest wall: no tenderness Cardio: regular rate and rhythm, S1, S2 normal, no murmur, click, rub or gallop GI: soft, non-tender; bowel sounds normal; no masses,  no organomegaly Extremities: extremities normal, atraumatic, no cyanosis or edema Pulses: 2+ and symmetric  CBC    Component Value Date/Time   WBC 16.6 (H) 11/17/2015 0448   RBC 7.04 (H) 11/17/2015 0448   HGB 19.7 (H) 11/17/2015 0448   HCT 59.6 (H) 11/17/2015 0448   PLT 503 (H) 11/17/2015 0448   MCV 84.7 11/17/2015 0448   MCH 28.0 11/17/2015 0448   MCHC 33.1 11/17/2015 0448   RDW 17.1 (H) 11/17/2015 0448   LYMPHSABS 1.3 11/17/2015 0448   MONOABS 0.8 11/17/2015 0448   EOSABS 0.3 11/17/2015 0448   BASOSABS 0.1 11/17/2015 0448     Chemistry      Component Value Date/Time   NA 137 11/17/2015 0448   K 4.3 11/17/2015 0448   CL 105 11/17/2015 0448   CO2 23 11/17/2015 0448   BUN 12 11/17/2015 0448   CREATININE 0.83 11/17/2015 0448      Component Value Date/Time   CALCIUM 9.4 11/17/2015 0448   ALKPHOS 86 11/17/2015 0627   AST 54 (H) 11/17/2015 0627   ALT 23 11/17/2015 0627   BILITOT 1.7 (H) 11/17/2015 0627     EXAM: CT ABDOMEN AND PELVIS WITHOUT CONTRAST  TECHNIQUE: Multidetector CT imaging of the abdomen and pelvis was performed following the standard protocol without IV contrast.  COMPARISON:  04/25/2006  FINDINGS: Lower chest and abdominal wall:  No contributory findings.  Hepatobiliary: No focal liver abnormality.No evidence of biliary obstruction or stone.  Pancreas: Unremarkable.  Spleen: Mild splenomegaly with 13 cm AP span and 54 mm thickness.  No focal abnormality.  Adrenals/Urinary Tract: Negative adrenals. No hydronephrosis or stone. Unremarkable collapsed bladder.  Reproductive:Hysterectomy with negative adnexae.  Stomach/Bowel:  No obstruction. No appendicitis.  Vascular/Lymphatic: No acute vascular abnormality. No mass or adenopathy.  Peritoneal: Bilateral L3 and L4 pars defects with grade 1 anterolisthesis at L4-5.  Musculoskeletal: No acute abnormalities.  IMPRESSION: 1. No acute finding.  No hydronephrosis or urolithiasis. 2. Mild and nonspecific splenomegaly. 3. 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  Assessment and Plan:   45 year old woman with the following issues:  1. Polycythemia: She presented with elevated hemoglobin dating back to 2013. At that time her hemoglobin was around 15.6 with elevated platelets at 486. Since that time, she had a persistent elevation in her hemoglobin and most recently her hemoglobin was around 19.7 with elevated white cell count and platelet count. She  does report symptoms of myalgias but no other complaints. She denied any thrombotic or bleeding episodes.  The differential diagnosis was discussed today with the patient and her husband. These findings suggest a myeloproliferative disorder such as polycythemia vera or essential thrombocythemia. Reactive versus secondary causes of polycythemia are consideration such as rheumatological disorder, secondhand smoking, exposure to fumes or, monoxide are consideration but less likely.  An erythropoietin producing tumor is considered less likely given the lack of symptoms as well as recent imaging studies. She had a CT scan of the abdomen and pelvis in June 2017 which did not show any malignancy.  To complete the evaluation I will repeat his CBC, had a subtotal level, JAK2 mutation status will be helpful in determining the etiology. Bone marrow biopsy could also be a consideration depending on these  results.  The management standpoint, I agree with continuing aspirin. We have discussed the risks and benefits of therapeutic phlebotomy given her symptoms. Obtaining phlebotomy will help her migraine headaches as well as or possibly myalgias. We will arrange for phlebotomy this week and repeat in one month depending on her tolerance.  2. Thrombosis prophylaxis: She is currently on aspirin therapy. Cytoreductive therapy could be considered down the line. She is at low risk given her age and lack of thrombosis history.  3. Follow-up: Will be in 4 weeks to discuss the findings of her workup.

## 2016-02-20 NOTE — Telephone Encounter (Signed)
PATIENT SENT BACK TO LAB AND GIVEN AVS REPORT AND APPOINTMENTS FOR September AND October

## 2016-02-21 LAB — ERYTHROPOIETIN: Erythropoietin: 2.9 m[IU]/mL (ref 2.6–18.5)

## 2016-02-23 ENCOUNTER — Ambulatory Visit (HOSPITAL_BASED_OUTPATIENT_CLINIC_OR_DEPARTMENT_OTHER): Payer: 59

## 2016-02-23 VITALS — BP 134/79 | HR 86 | Temp 97.4°F | Resp 22

## 2016-02-23 DIAGNOSIS — D45 Polycythemia vera: Secondary | ICD-10-CM

## 2016-02-23 NOTE — Progress Notes (Signed)
1645.  Patient c/o feeling dizzy.  Blood pressure dropped from 147/94 to 129/77.   Patient given po fluids and vanilla wafers. 1700.  Patient states she feels much better.  Blood pressure 134/79.   She has had 2 cokes and 2 gingerales. 1718.  Patient escorted to front lobby.  States she is feeling fine.  She did Valet parking.

## 2016-02-23 NOTE — Patient Instructions (Signed)

## 2016-03-19 ENCOUNTER — Telehealth: Payer: Self-pay | Admitting: Oncology

## 2016-03-19 ENCOUNTER — Ambulatory Visit (HOSPITAL_BASED_OUTPATIENT_CLINIC_OR_DEPARTMENT_OTHER): Payer: 59

## 2016-03-19 ENCOUNTER — Ambulatory Visit (HOSPITAL_BASED_OUTPATIENT_CLINIC_OR_DEPARTMENT_OTHER): Payer: 59 | Admitting: Oncology

## 2016-03-19 ENCOUNTER — Other Ambulatory Visit (HOSPITAL_BASED_OUTPATIENT_CLINIC_OR_DEPARTMENT_OTHER): Payer: 59

## 2016-03-19 VITALS — BP 133/81 | HR 99 | Temp 98.1°F | Resp 20 | Ht 64.0 in | Wt 242.2 lb

## 2016-03-19 VITALS — BP 131/85 | HR 95 | Temp 98.2°F | Resp 18

## 2016-03-19 DIAGNOSIS — D45 Polycythemia vera: Secondary | ICD-10-CM

## 2016-03-19 LAB — CBC WITH DIFFERENTIAL/PLATELET
BASO%: 2.8 % — ABNORMAL HIGH (ref 0.0–2.0)
Basophils Absolute: 0.3 10*3/uL — ABNORMAL HIGH (ref 0.0–0.1)
EOS%: 4.8 % (ref 0.0–7.0)
Eosinophils Absolute: 0.5 10*3/uL (ref 0.0–0.5)
HCT: 58.2 % — ABNORMAL HIGH (ref 34.8–46.6)
HGB: 18.7 g/dL — ABNORMAL HIGH (ref 11.6–15.9)
LYMPH%: 15.5 % (ref 14.0–49.7)
MCH: 28.2 pg (ref 25.1–34.0)
MCHC: 32.1 g/dL (ref 31.5–36.0)
MCV: 87.8 fL (ref 79.5–101.0)
MONO#: 0.4 10*3/uL (ref 0.1–0.9)
MONO%: 3.8 % (ref 0.0–14.0)
NEUT#: 7.7 10*3/uL — ABNORMAL HIGH (ref 1.5–6.5)
NEUT%: 73.1 % (ref 38.4–76.8)
Platelets: 574 10*3/uL — ABNORMAL HIGH (ref 145–400)
RBC: 6.62 10*6/uL — ABNORMAL HIGH (ref 3.70–5.45)
RDW: 19.3 % — ABNORMAL HIGH (ref 11.2–14.5)
WBC: 10.6 10*3/uL — ABNORMAL HIGH (ref 3.9–10.3)
lymph#: 1.6 10*3/uL (ref 0.9–3.3)

## 2016-03-19 NOTE — Patient Instructions (Signed)

## 2016-03-19 NOTE — Progress Notes (Signed)
Hematology and Oncology Follow Up Visit  Eileen [name] [id number] 1970/08/04 45 y.o. 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 She presented with hemoglobin of 19, erythropoietin within normal range and positive JAK2 mutation.  Current therapy: Aspirin 325 mg daily. Therapeutic phlebotomy every 2 weeks to keep her hematocrit less than 45.  Interim History: Eileen [name] presents today for a follow-up visit. She received therapeutic phlebotomy 4 weeks ago and tolerated it well. She reports her arm pain have improved temporarily but last 2 weeks he reported slight increase in her arm pain. She also reported some pruritus. She denied any headaches, thrombotic events or chest pain. She remains active and performs activities of daily living. She denied any constitutional symptoms or joint swelling.  She does not report any blurry vision, syncope or seizures. She denied any fevers, chills, sweats or joint swelling. She does report myalgias as described. She does not report any chest pain, orthopnea or leg edema. She does not report any cough, wheezing or hemoptysis. She does not report any nausea, vomiting, abdominal pain, hematochezia or melena. She does not report any frequency urgency or hesitancy. She does not report any pathological fractures. She does not report any lymphadenopathy or petechiae. Remaining review of systems unremarkable.   Medications: I have reviewed the patient's current medications.  Current Outpatient Prescriptions  Medication Sig Dispense Refill  . aspirin EC 325 MG tablet Take 1 tablet (325 mg total) by mouth 2 (two) times daily. 84 tablet 0  . traMADol (ULTRAM) 50 MG tablet Take 50 mg by mouth every 6 (six) hours as needed for moderate pain.     No current facility-administered medications for this visit.      Allergies: No Known Allergies  Past Medical  History, Surgical history, Social history, and Family History were reviewed and updated.  Physical Exam: Blood pressure 133/81, pulse 99, temperature 98.1 F (36.7 C), temperature source Oral, resp. rate 20, height 5\' 4"  (1.626 m), weight 242 lb 3.2 oz (109.9 kg), SpO2 98 %. ECOG: 0 General appearance: alert and cooperative Head: Normocephalic, without obvious abnormality Neck: no adenopathy Lymph nodes: Cervical, supraclavicular, and axillary nodes normal. Heart:regular rate and rhythm, S1, S2 normal, no murmur, click, rub or gallop Lung:chest clear, no wheezing, rales, normal symmetric air entry Abdomin: soft, non-tender, without masses or organomegaly EXT:no erythema, induration, or nodules   Lab Results: Lab Results  Component Value Date   WBC 10.6 (H) 03/19/2016   HGB 18.7 (H) 03/19/2016   HCT 58.2 (H) 03/19/2016   MCV 87.8 03/19/2016   PLT 574 (H) 03/19/2016     Chemistry      Component Value Date/Time   NA 140 02/20/2016 1432   K 4.1 02/20/2016 1432   CL 105 11/17/2015 0448   CO2 25 02/20/2016 1432   BUN 11.1 02/20/2016 1432   CREATININE 1.0 02/20/2016 1432      Component Value Date/Time   CALCIUM 9.8 02/20/2016 1432   ALKPHOS 115 02/20/2016 1432   AST 13 02/20/2016 1432   ALT 13 02/20/2016 1432   BILITOT 0.71 02/20/2016 1432      Impression and Plan:  45 year old woman with:  1. Polycythemia vera documented in September 2017. Her hemoglobin was 19.9 and noted to have positive JAK2 mutation. She also has an elevated platelet count and normal white cell count.  The natural course of this disease was discussed with the patient today as  well as treatment options. Therapeutic phlebotomy as necessary in controlling her hemoglobin to prevent any future thrombotic events. I plan on increasing the frequency to every 2 weeks to get his hematocrit less than 45.  2. Thrombosis prophylaxis: She will continue on aspirin at this time indefinitely.  3. Cytoreductive  therapy: The role of hydroxyurea was discussed today and I see no role for it at this time. Her risk of thrombosis is low given her young age and no previous thrombosis history. This will be considered in the future of her platelet counts continues to rise.  4. Follow-up: Will be in 2 months after every 2 week phlebotomies for the next 8 weeks.    [id number], MD 10/10/20172:36 PM

## 2016-03-19 NOTE — Progress Notes (Signed)
522 grams removed from patient's right A/C with no issues. Patient d/c in stable condition post 30 minute observation. Drinks/snacks provided before phlebotomy

## 2016-03-19 NOTE — Telephone Encounter (Signed)
Gave patient avs report and appointments for October and November.  °

## 2016-04-02 ENCOUNTER — Ambulatory Visit (HOSPITAL_BASED_OUTPATIENT_CLINIC_OR_DEPARTMENT_OTHER): Payer: 59

## 2016-04-02 ENCOUNTER — Other Ambulatory Visit (HOSPITAL_BASED_OUTPATIENT_CLINIC_OR_DEPARTMENT_OTHER): Payer: 59

## 2016-04-02 VITALS — BP 139/73 | HR 88 | Temp 97.9°F | Resp 17

## 2016-04-02 DIAGNOSIS — D45 Polycythemia vera: Secondary | ICD-10-CM

## 2016-04-02 LAB — CBC WITH DIFFERENTIAL/PLATELET
BASO%: 0.1 % (ref 0.0–2.0)
Basophils Absolute: 0 10*3/uL (ref 0.0–0.1)
EOS%: 5.1 % (ref 0.0–7.0)
Eosinophils Absolute: 0.6 10*3/uL — ABNORMAL HIGH (ref 0.0–0.5)
HCT: 57.5 % — ABNORMAL HIGH (ref 34.8–46.6)
HGB: 18.7 g/dL — ABNORMAL HIGH (ref 11.6–15.9)
LYMPH%: 16.3 % (ref 14.0–49.7)
MCH: 28.1 pg (ref 25.1–34.0)
MCHC: 32.4 g/dL (ref 31.5–36.0)
MCV: 86.5 fL (ref 79.5–101.0)
MONO#: 0.5 10*3/uL (ref 0.1–0.9)
MONO%: 4.3 % (ref 0.0–14.0)
NEUT#: 8.2 10*3/uL — ABNORMAL HIGH (ref 1.5–6.5)
NEUT%: 74.2 % (ref 38.4–76.8)
Platelets: 627 10*3/uL — ABNORMAL HIGH (ref 145–400)
RBC: 6.65 10*6/uL — ABNORMAL HIGH (ref 3.70–5.45)
RDW: 17.8 % — ABNORMAL HIGH (ref 11.2–14.5)
WBC: 11.1 10*3/uL — ABNORMAL HIGH (ref 3.9–10.3)
lymph#: 1.8 10*3/uL (ref 0.9–3.3)

## 2016-04-02 NOTE — Progress Notes (Signed)
446 grams drained from right AC via 16 gauge needle from 1546-1551.  Procedure stopped short of 500 grams d/t pt c/o feeling light headed.  Pt reclined and given beverage.   At 1559 pt reports no more light headedness or dizziness.  Will continue to observe.    Pt discharged to home with husband.  States she feels better. VSS

## 2016-04-02 NOTE — Patient Instructions (Signed)

## 2016-04-16 ENCOUNTER — Other Ambulatory Visit (HOSPITAL_BASED_OUTPATIENT_CLINIC_OR_DEPARTMENT_OTHER): Payer: 59

## 2016-04-16 ENCOUNTER — Ambulatory Visit (HOSPITAL_BASED_OUTPATIENT_CLINIC_OR_DEPARTMENT_OTHER): Payer: 59

## 2016-04-16 VITALS — BP 141/81 | HR 84 | Temp 98.2°F | Resp 18

## 2016-04-16 DIAGNOSIS — D45 Polycythemia vera: Secondary | ICD-10-CM

## 2016-04-16 LAB — CBC WITH DIFFERENTIAL/PLATELET
BASO%: 2.1 % — ABNORMAL HIGH (ref 0.0–2.0)
Basophils Absolute: 0.2 10*3/uL — ABNORMAL HIGH (ref 0.0–0.1)
EOS%: 4.1 % (ref 0.0–7.0)
Eosinophils Absolute: 0.4 10*3/uL (ref 0.0–0.5)
HCT: 54.7 % — ABNORMAL HIGH (ref 34.8–46.6)
HGB: 17.7 g/dL — ABNORMAL HIGH (ref 11.6–15.9)
LYMPH%: 14.5 % (ref 14.0–49.7)
MCH: 27.7 pg (ref 25.1–34.0)
MCHC: 32.4 g/dL (ref 31.5–36.0)
MCV: 85.6 fL (ref 79.5–101.0)
MONO#: 0.6 10*3/uL (ref 0.1–0.9)
MONO%: 5.8 % (ref 0.0–14.0)
NEUT#: 7.3 10*3/uL — ABNORMAL HIGH (ref 1.5–6.5)
NEUT%: 73.5 % (ref 38.4–76.8)
Platelets: 474 10*3/uL — ABNORMAL HIGH (ref 145–400)
RBC: 6.39 10*6/uL — ABNORMAL HIGH (ref 3.70–5.45)
RDW: 16.7 % — ABNORMAL HIGH (ref 11.2–14.5)
WBC: 9.9 10*3/uL (ref 3.9–10.3)
lymph#: 1.4 10*3/uL (ref 0.9–3.3)
nRBC: 0 % (ref 0–0)

## 2016-04-16 NOTE — Patient Instructions (Signed)
Therapeutic Phlebotomy, Care After  Refer to this sheet in the next few weeks. These instructions provide you with information about caring for yourself after your procedure. Your health care provider may also give you more specific instructions. Your treatment has been planned according to current medical practices, but problems sometimes occur. Call your health care provider if you have any problems or questions after your procedure.  WHAT TO EXPECT AFTER THE PROCEDURE  After your procedure, it is common to have:   Light-headedness or dizziness. You may feel faint.   Nausea.   Tiredness.  HOME CARE INSTRUCTIONS  Activities   Return to your normal activities as directed by your health care provider. Most people can go back to their normal activities right away.   Avoid strenuous physical activity and heavy lifting or pulling for about 5 hours after the procedure. Do not lift anything that is heavier than 10 lb (4.5 kg).   Athletes should avoid strenuous exercise for at least 12 hours.   Change positions slowly for the remainder of the day. This will help to prevent light-headedness or fainting.   If you feel light-headed, lie down until the feeling goes away.  Eating and Drinking   Be sure to eat well-balanced meals for the next 24 hours.   Drink enough fluid to keep your urine clear or pale yellow.   Avoid drinking alcohol on the day that you had the procedure.  Care of the Needle Insertion Site   Keep your bandage dry. You can remove the bandage after about 5 hours or as directed by your health care provider.   If you have bleeding from the needle insertion site, elevate your arm and press firmly on the site until the bleeding stops.   If you have bruising at the site, apply ice to the area:   Put ice in a plastic bag.   Place a towel between your skin and the bag.   Leave the ice on for 20 minutes, 2-3 times a day for the first 24 hours.   If the swelling does not go away after 24 hours, apply  a warm, moist washcloth to the area for 20 minutes, 2-3 times a day.  General Instructions   Avoid smoking for at least 30 minutes after the procedure.   Keep all follow-up visits as directed by your health care provider. It is important to continue with further therapeutic phlebotomy treatments as directed.  SEEK MEDICAL CARE IF:   You have redness, swelling, or pain at the needle insertion site.   You have fluid, blood, or pus coming from the needle insertion site.   You feel light-headed, dizzy, or nauseated, and the feeling does not go away.   You notice new bruising at the needle insertion site.   You feel weaker than normal.   You have a fever or chills.  SEEK IMMEDIATE MEDICAL CARE IF:   You have severe nausea or vomiting.   You have chest pain.   You have trouble breathing.    This information is not intended to replace advice given to you by your health care provider. Make sure you discuss any questions you have with your health care provider.    Document Released: 10/29/2010 Document Revised: 10/11/2014 Document Reviewed: 05/23/2014  Elsevier Interactive Patient Education 2016 Elsevier Inc[employer]

## 2016-04-30 ENCOUNTER — Ambulatory Visit (HOSPITAL_BASED_OUTPATIENT_CLINIC_OR_DEPARTMENT_OTHER): Payer: 59

## 2016-04-30 ENCOUNTER — Other Ambulatory Visit (HOSPITAL_BASED_OUTPATIENT_CLINIC_OR_DEPARTMENT_OTHER): Payer: 59

## 2016-04-30 VITALS — BP 136/98 | HR 101 | Temp 98.3°F | Resp 18

## 2016-04-30 DIAGNOSIS — D45 Polycythemia vera: Secondary | ICD-10-CM | POA: Diagnosis not present

## 2016-04-30 LAB — CBC WITH DIFFERENTIAL/PLATELET
BASO%: 1.5 % (ref 0.0–2.0)
Basophils Absolute: 0.2 10*3/uL — ABNORMAL HIGH (ref 0.0–0.1)
EOS%: 3.7 % (ref 0.0–7.0)
Eosinophils Absolute: 0.4 10*3/uL (ref 0.0–0.5)
HCT: 53.2 % — ABNORMAL HIGH (ref 34.8–46.6)
HGB: 17.2 g/dL — ABNORMAL HIGH (ref 11.6–15.9)
LYMPH%: 18.6 % (ref 14.0–49.7)
MCH: 27 pg (ref 25.1–34.0)
MCHC: 32.3 g/dL (ref 31.5–36.0)
MCV: 83.4 fL (ref 79.5–101.0)
MONO#: 0.5 10*3/uL (ref 0.1–0.9)
MONO%: 4.2 % (ref 0.0–14.0)
NEUT#: 8 10*3/uL — ABNORMAL HIGH (ref 1.5–6.5)
NEUT%: 72 % (ref 38.4–76.8)
Platelets: 528 10*3/uL — ABNORMAL HIGH (ref 145–400)
RBC: 6.38 10*6/uL — ABNORMAL HIGH (ref 3.70–5.45)
RDW: 16.7 % — ABNORMAL HIGH (ref 11.2–14.5)
WBC: 11.1 10*3/uL — ABNORMAL HIGH (ref 3.9–10.3)
lymph#: 2.1 10*3/uL (ref 0.9–3.3)
nRBC: 0 % (ref 0–0)

## 2016-04-30 NOTE — Progress Notes (Signed)
Eileen [name] presents today for phlebotomy per MD orders. Phlebotomy procedure started at 1531 and ended at 1536. 538 grams removed.  16 g to R AC. Patient observed for 30 minutes after procedure without any incident. Patient tolerated procedure well. IV needle removed intact.

## 2016-04-30 NOTE — Patient Instructions (Signed)
Therapeutic Phlebotomy, Care After  Refer to this sheet in the next few weeks. These instructions provide you with information about caring for yourself after your procedure. Your health care provider may also give you more specific instructions. Your treatment has been planned according to current medical practices, but problems sometimes occur. Call your health care provider if you have any problems or questions after your procedure.  WHAT TO EXPECT AFTER THE PROCEDURE  After your procedure, it is common to have:   Light-headedness or dizziness. You may feel faint.   Nausea.   Tiredness.  HOME CARE INSTRUCTIONS  Activities   Return to your normal activities as directed by your health care provider. Most people can go back to their normal activities right away.   Avoid strenuous physical activity and heavy lifting or pulling for about 5 hours after the procedure. Do not lift anything that is heavier than 10 lb (4.5 kg).   Athletes should avoid strenuous exercise for at least 12 hours.   Change positions slowly for the remainder of the day. This will help to prevent light-headedness or fainting.   If you feel light-headed, lie down until the feeling goes away.  Eating and Drinking   Be sure to eat well-balanced meals for the next 24 hours.   Drink enough fluid to keep your urine clear or pale yellow.   Avoid drinking alcohol on the day that you had the procedure.  Care of the Needle Insertion Site   Keep your bandage dry. You can remove the bandage after about 5 hours or as directed by your health care provider.   If you have bleeding from the needle insertion site, elevate your arm and press firmly on the site until the bleeding stops.   If you have bruising at the site, apply ice to the area:   Put ice in a plastic bag.   Place a towel between your skin and the bag.   Leave the ice on for 20 minutes, 2-3 times a day for the first 24 hours.   If the swelling does not go away after 24 hours, apply  a warm, moist washcloth to the area for 20 minutes, 2-3 times a day.  General Instructions   Avoid smoking for at least 30 minutes after the procedure.   Keep all follow-up visits as directed by your health care provider. It is important to continue with further therapeutic phlebotomy treatments as directed.  SEEK MEDICAL CARE IF:   You have redness, swelling, or pain at the needle insertion site.   You have fluid, blood, or pus coming from the needle insertion site.   You feel light-headed, dizzy, or nauseated, and the feeling does not go away.   You notice new bruising at the needle insertion site.   You feel weaker than normal.   You have a fever or chills.  SEEK IMMEDIATE MEDICAL CARE IF:   You have severe nausea or vomiting.   You have chest pain.   You have trouble breathing.    This information is not intended to replace advice given to you by your health care provider. Make sure you discuss any questions you have with your health care provider.    Document Released: 10/29/2010 Document Revised: 10/11/2014 Document Reviewed: 05/23/2014  Elsevier Interactive Patient Education 2016 Elsevier Inc[employer]

## 2016-04-30 NOTE — Addendum Note (Signed)
Addended by: Smiley Houseman F on: 04/30/2016 04:16 PM   Modules accepted: Orders

## 2016-05-15 ENCOUNTER — Ambulatory Visit (HOSPITAL_BASED_OUTPATIENT_CLINIC_OR_DEPARTMENT_OTHER): Payer: 59 | Admitting: Oncology

## 2016-05-15 ENCOUNTER — Other Ambulatory Visit (HOSPITAL_BASED_OUTPATIENT_CLINIC_OR_DEPARTMENT_OTHER): Payer: 59

## 2016-05-15 ENCOUNTER — Telehealth: Payer: Self-pay | Admitting: Oncology

## 2016-05-15 ENCOUNTER — Ambulatory Visit (HOSPITAL_BASED_OUTPATIENT_CLINIC_OR_DEPARTMENT_OTHER): Payer: 59

## 2016-05-15 VITALS — BP 144/88 | HR 83 | Temp 98.2°F | Resp 14 | Ht 64.0 in | Wt 244.6 lb

## 2016-05-15 VITALS — BP 125/90 | HR 86 | Temp 98.4°F | Resp 18

## 2016-05-15 DIAGNOSIS — D45 Polycythemia vera: Secondary | ICD-10-CM

## 2016-05-15 LAB — CBC WITH DIFFERENTIAL/PLATELET
BASO%: 3.2 % — ABNORMAL HIGH (ref 0.0–2.0)
Basophils Absolute: 0.3 10*3/uL — ABNORMAL HIGH (ref 0.0–0.1)
EOS%: 4.6 % (ref 0.0–7.0)
Eosinophils Absolute: 0.5 10*3/uL (ref 0.0–0.5)
HCT: 48.9 % — ABNORMAL HIGH (ref 34.8–46.6)
HGB: 15.5 g/dL (ref 11.6–15.9)
LYMPH%: 17.5 % (ref 14.0–49.7)
MCH: 25.8 pg (ref 25.1–34.0)
MCHC: 31.7 g/dL (ref 31.5–36.0)
MCV: 81.4 fL (ref 79.5–101.0)
MONO#: 0.5 10*3/uL (ref 0.1–0.9)
MONO%: 4.9 % (ref 0.0–14.0)
NEUT#: 7.1 10*3/uL — ABNORMAL HIGH (ref 1.5–6.5)
NEUT%: 69.8 % (ref 38.4–76.8)
Platelets: 542 10*3/uL — ABNORMAL HIGH (ref 145–400)
RBC: 6.01 10*6/uL — ABNORMAL HIGH (ref 3.70–5.45)
RDW: 16.8 % — ABNORMAL HIGH (ref 11.2–14.5)
WBC: 10.2 10*3/uL (ref 3.9–10.3)
lymph#: 1.8 10*3/uL (ref 0.9–3.3)
nRBC: 0 % (ref 0–0)

## 2016-05-15 NOTE — Progress Notes (Signed)
Hematology and Oncology Follow Up Visit  TOVE KOHLMEIER [id number] Jun 24, 1970 45 y.o. 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 She presented with hemoglobin of 19, erythropoietin within normal range and positive JAK2 mutation.  Current therapy: Aspirin 325 mg daily. Therapeutic phlebotomy every 2 weeks to keep her hematocrit less than 45. This will be switched to every 3 weeks starting on December 2017.  Interim History: Ms. Mcclenahan presents today for a follow-up visit. Since the last visit, she has been receiving phlebotomy every 2 weeks and off tolerated it well. She has reported some joint pain that has improved with phlebotomy. She denied any headaches, thrombotic events or chest pain. She remains active and performs activities of daily living. She denied any constitutional symptoms or joint swelling. She has not reported any complications associated with phlebotomy such as dizziness, lightheadedness or fatigue. She remains relatively asymptomatic at this time.  She does not report any blurry vision, syncope or seizures. She denied any fevers, chills, sweats or joint swelling. She does report myalgias as described. She does not report any chest pain, orthopnea or leg edema. She does not report any cough, wheezing or hemoptysis. She does not report any nausea, vomiting, abdominal pain, hematochezia or melena. She does not report any frequency urgency or hesitancy. She does not report any pathological fractures. She does not report any lymphadenopathy or petechiae. Remaining review of systems unremarkable.   Medications: I have reviewed the patient's current medications.  Current Outpatient Prescriptions  Medication Sig Dispense Refill  . aspirin EC 325 MG tablet Take 1 tablet (325 mg total) by mouth 2 (two) times daily. 84 tablet 0  . traMADol (ULTRAM) 50 MG tablet Take 50 mg by  mouth every 6 (six) hours as needed for moderate pain.     No current facility-administered medications for this visit.      Allergies: No Known Allergies  Past Medical History, Surgical history, Social history, and Family History were reviewed and updated.  Physical Exam: Blood pressure (!) 144/88, pulse 83, temperature 98.2 F (36.8 C), temperature source Oral, resp. rate 14, height 5\' 4"  (1.626 m), weight 244 lb 9.6 oz (110.9 kg), SpO2 99 %. ECOG: 0 General appearance: alert and cooperative appeared without distress. Head: Normocephalic, without obvious abnormality Neck: no adenopathy Lymph nodes: Cervical, supraclavicular, and axillary nodes normal. Heart:regular rate and rhythm, S1, S2 normal, no murmur, click, rub or gallop Lung:chest clear, no wheezing, rales, normal symmetric air entry Abdomin: soft, non-tender, without masses or organomegaly no splenomegaly. EXT:no erythema, induration, or nodules   Lab Results: Lab Results  Component Value Date   WBC 10.2 05/15/2016   HGB 15.5 05/15/2016   HCT 48.9 (H) 05/15/2016   MCV 81.4 05/15/2016   PLT 542 (H) 05/15/2016     Chemistry      Component Value Date/Time   NA 140 02/20/2016 1432   K 4.1 02/20/2016 1432   CL 105 11/17/2015 0448   CO2 25 02/20/2016 1432   BUN 11.1 02/20/2016 1432   CREATININE 1.0 02/20/2016 1432      Component Value Date/Time   CALCIUM 9.8 02/20/2016 1432   ALKPHOS 115 02/20/2016 1432   AST 13 02/20/2016 1432   ALT 13 02/20/2016 1432   BILITOT 0.71 02/20/2016 1432      Impression and Plan:  45 year old woman with:  1. Polycythemia vera documented in September 2017. Her hemoglobin was 19.9 and noted to  have positive JAK2 mutation. She also has an elevated platelet count and normal white cell count.  She is currently receiving phlebotomy every 2 weeks with excellent control of her hemoglobin and symptoms. The plan is to switch her to phlebotomy every 3 weeks for the next 9 weeks and  continue to taper her schedule to keep her hematocrit close to 45.  2. Thrombosis prophylaxis: She will continue on aspirin at this time indefinitely.  3. Cytoreductive therapy: The role of hydroxyurea was discussed previously and will continue to address her moving forward. No indication for cytoreductive therapy at this time.  4. Follow-up: Will be in 3 weeks to continue phlebotomy. She'll have clinical visit in 9 weeks.    [facility], MD 12/6/20173:09 PM

## 2016-05-15 NOTE — Telephone Encounter (Signed)
Appointments scheduled per 12/6 LOS. Patient given AVS report and calendars with future scheduled appointments. °

## 2016-05-15 NOTE — Progress Notes (Signed)
Patient tolerated therapeutic phlebotomy well. HCT 48.9. Phlebotomy started at 1532 and concluded at 1540. 533 grams phlebotomized. Snack and drink offered and accepted. 30 min observation began at 1542.

## 2016-05-15 NOTE — Patient Instructions (Signed)
Therapeutic Phlebotomy, Care After  Refer to this sheet in the next few weeks. These instructions provide you with information about caring for yourself after your procedure. Your health care provider may also give you more specific instructions. Your treatment has been planned according to current medical practices, but problems sometimes occur. Call your health care provider if you have any problems or questions after your procedure.  WHAT TO EXPECT AFTER THE PROCEDURE  After your procedure, it is common to have:   Light-headedness or dizziness. You may feel faint.   Nausea.   Tiredness.  HOME CARE INSTRUCTIONS  Activities   Return to your normal activities as directed by your health care provider. Most people can go back to their normal activities right away.   Avoid strenuous physical activity and heavy lifting or pulling for about 5 hours after the procedure. Do not lift anything that is heavier than 10 lb (4.5 kg).   Athletes should avoid strenuous exercise for at least 12 hours.   Change positions slowly for the remainder of the day. This will help to prevent light-headedness or fainting.   If you feel light-headed, lie down until the feeling goes away.  Eating and Drinking   Be sure to eat well-balanced meals for the next 24 hours.   Drink enough fluid to keep your urine clear or pale yellow.   Avoid drinking alcohol on the day that you had the procedure.  Care of the Needle Insertion Site   Keep your bandage dry. You can remove the bandage after about 5 hours or as directed by your health care provider.   If you have bleeding from the needle insertion site, elevate your arm and press firmly on the site until the bleeding stops.   If you have bruising at the site, apply ice to the area:   Put ice in a plastic bag.   Place a towel between your skin and the bag.   Leave the ice on for 20 minutes, 2-3 times a day for the first 24 hours.   If the swelling does not go away after 24 hours, apply  a warm, moist washcloth to the area for 20 minutes, 2-3 times a day.  General Instructions   Avoid smoking for at least 30 minutes after the procedure.   Keep all follow-up visits as directed by your health care provider. It is important to continue with further therapeutic phlebotomy treatments as directed.  SEEK MEDICAL CARE IF:   You have redness, swelling, or pain at the needle insertion site.   You have fluid, blood, or pus coming from the needle insertion site.   You feel light-headed, dizzy, or nauseated, and the feeling does not go away.   You notice new bruising at the needle insertion site.   You feel weaker than normal.   You have a fever or chills.  SEEK IMMEDIATE MEDICAL CARE IF:   You have severe nausea or vomiting.   You have chest pain.   You have trouble breathing.    This information is not intended to replace advice given to you by your health care provider. Make sure you discuss any questions you have with your health care provider.    Document Released: 10/29/2010 Document Revised: 10/11/2014 Document Reviewed: 05/23/2014  Elsevier Interactive Patient Education 2016 Elsevier Inc[employer]

## 2016-05-16 ENCOUNTER — Telehealth: Payer: Self-pay | Admitting: *Deleted

## 2016-05-16 NOTE — Telephone Encounter (Signed)
Per LOS I have scheduled appts and notified the scheduler 

## 2016-06-05 ENCOUNTER — Other Ambulatory Visit (HOSPITAL_BASED_OUTPATIENT_CLINIC_OR_DEPARTMENT_OTHER): Payer: 59

## 2016-06-05 ENCOUNTER — Ambulatory Visit (HOSPITAL_BASED_OUTPATIENT_CLINIC_OR_DEPARTMENT_OTHER): Payer: 59

## 2016-06-05 DIAGNOSIS — D45 Polycythemia vera: Secondary | ICD-10-CM

## 2016-06-05 LAB — CBC WITH DIFFERENTIAL/PLATELET
BASO%: 0.1 % (ref 0.0–2.0)
Basophils Absolute: 0 10*3/uL (ref 0.0–0.1)
EOS%: 4.6 % (ref 0.0–7.0)
Eosinophils Absolute: 0.5 10*3/uL (ref 0.0–0.5)
HCT: 47.4 % — ABNORMAL HIGH (ref 34.8–46.6)
HGB: 15.2 g/dL (ref 11.6–15.9)
LYMPH%: 16 % (ref 14.0–49.7)
MCH: 24.6 pg — ABNORMAL LOW (ref 25.1–34.0)
MCHC: 32.1 g/dL (ref 31.5–36.0)
MCV: 76.8 fL — ABNORMAL LOW (ref 79.5–101.0)
MONO#: 0.4 10*3/uL (ref 0.1–0.9)
MONO%: 3.6 % (ref 0.0–14.0)
NEUT#: 8.3 10*3/uL — ABNORMAL HIGH (ref 1.5–6.5)
NEUT%: 75.7 % (ref 38.4–76.8)
Platelets: 552 10*3/uL — ABNORMAL HIGH (ref 145–400)
RBC: 6.17 10*6/uL — ABNORMAL HIGH (ref 3.70–5.45)
RDW: 18.9 % — ABNORMAL HIGH (ref 11.2–14.5)
WBC: 11 10*3/uL — ABNORMAL HIGH (ref 3.9–10.3)
lymph#: 1.8 10*3/uL (ref 0.9–3.3)

## 2016-06-05 NOTE — Patient Instructions (Signed)
Therapeutic Phlebotomy Therapeutic phlebotomy is the controlled removal of blood from a person's body for the purpose of treating a medical condition. The procedure is similar to donating blood. Usually, about a pint (470 mL, or 0.47L) of blood is removed. The average adult has 9-12 pints (4.3-5.7 L) of blood. Therapeutic phlebotomy may be used to treat the following medical conditions:  Hemochromatosis. This is a condition in which the blood contains too much iron.  Polycythemia vera. This is a condition in which the blood contains too many red blood cells.  Porphyria cutanea tarda. This is a disease in which an important part of hemoglobin is not made properly. It results in the buildup of abnormal amounts of porphyrins in the body.  Sickle cell disease. This is a condition in which the red blood cells form an abnormal crescent shape rather than a round shape. Tell a health care provider about:  Any allergies you have.  All medicines you are taking, including vitamins, herbs, eye drops, creams, and over-the-counter medicines.  Any problems you or family members have had with anesthetic medicines.  Any blood disorders you have.  Any surgeries you have had.  Any medical conditions you have. What are the risks? Generally, this is a safe procedure. However, problems may occur, including:  Nausea or light-headedness.  Low blood pressure.  Soreness, bleeding, swelling, or bruising at the needle insertion site.  Infection. What happens before the procedure?  Follow instructions from your health care provider about eating or drinking restrictions.  Ask your health care provider about changing or stopping your regular medicines. This is especially important if you are taking diabetes medicines or blood thinners.  Wear clothing with sleeves that can be raised above the elbow.  Plan to have someone take you home after the procedure.  You may have a blood sample taken. What happens  during the procedure?  A needle will be inserted into one of your veins.  Tubing and a collection bag will be attached to that needle.  Blood will flow through the needle and tubing into the collection bag.  You may be asked to open and close your hand slowly and continually during the entire collection.  After the specified amount of blood has been removed from your body, the collection bag and tubing will be clamped.  The needle will be removed from your vein.  Pressure will be held on the site of the needle insertion to stop the bleeding.  A bandage (dressing) will be placed over the needle insertion site. The procedure may vary among health care providers and hospitals. What happens after the procedure?  Your recovery will be assessed and monitored.  You can return to your normal activities as directed by your health care provider. This information is not intended to replace advice given to you by your health care provider. Make sure you discuss any questions you have with your health care provider. Document Released: 10/29/2010 Document Revised: 01/27/2016 Document Reviewed: 05/23/2014 Elsevier Interactive Patient Education  2017 Elsevier Inc.  

## 2016-06-05 NOTE — Progress Notes (Signed)
Pt phlebotomized through right AC from 1554-1602.  501 grams removed.  Pt tolerated well.  30 min observation in process.   Pt provided drink during 30 minute obs.  Discharged to home with out complication.

## 2016-06-26 ENCOUNTER — Telehealth: Payer: Self-pay | Admitting: Oncology

## 2016-06-26 ENCOUNTER — Other Ambulatory Visit: Payer: 59

## 2016-06-26 NOTE — Telephone Encounter (Signed)
Pt called to r/s appts due to weather conditions. Gave pt new appt date/time per request

## 2016-06-28 ENCOUNTER — Ambulatory Visit (HOSPITAL_BASED_OUTPATIENT_CLINIC_OR_DEPARTMENT_OTHER): Payer: 59

## 2016-06-28 ENCOUNTER — Other Ambulatory Visit (HOSPITAL_BASED_OUTPATIENT_CLINIC_OR_DEPARTMENT_OTHER): Payer: 59

## 2016-06-28 VITALS — BP 129/76 | HR 74 | Temp 98.6°F | Resp 16

## 2016-06-28 DIAGNOSIS — D45 Polycythemia vera: Secondary | ICD-10-CM

## 2016-06-28 LAB — CBC WITH DIFFERENTIAL/PLATELET
BASO%: 3 % — ABNORMAL HIGH (ref 0.0–2.0)
Basophils Absolute: 0.3 10*3/uL — ABNORMAL HIGH (ref 0.0–0.1)
EOS%: 4.9 % (ref 0.0–7.0)
Eosinophils Absolute: 0.5 10*3/uL (ref 0.0–0.5)
HCT: 45.5 % (ref 34.8–46.6)
HGB: 14.2 g/dL (ref 11.6–15.9)
LYMPH%: 16.6 % (ref 14.0–49.7)
MCH: 23.1 pg — ABNORMAL LOW (ref 25.1–34.0)
MCHC: 31.2 g/dL — ABNORMAL LOW (ref 31.5–36.0)
MCV: 73.9 fL — ABNORMAL LOW (ref 79.5–101.0)
MONO#: 0.5 10*3/uL (ref 0.1–0.9)
MONO%: 4.8 % (ref 0.0–14.0)
NEUT#: 7.8 10*3/uL — ABNORMAL HIGH (ref 1.5–6.5)
NEUT%: 70.7 % (ref 38.4–76.8)
Platelets: 657 10*3/uL — ABNORMAL HIGH (ref 145–400)
RBC: 6.16 10*6/uL — ABNORMAL HIGH (ref 3.70–5.45)
RDW: 18.1 % — ABNORMAL HIGH (ref 11.2–14.5)
WBC: 11.1 10*3/uL — ABNORMAL HIGH (ref 3.9–10.3)
lymph#: 1.8 10*3/uL (ref 0.9–3.3)
nRBC: 0 % (ref 0–0)

## 2016-06-28 NOTE — Patient Instructions (Signed)
Therapeutic Phlebotomy Therapeutic phlebotomy is the controlled removal of blood from a person's body for the purpose of treating a medical condition. The procedure is similar to donating blood. Usually, about a pint (470 mL, or 0.47L) of blood is removed. The average adult has 9-12 pints (4.3-5.7 L) of blood. Therapeutic phlebotomy may be used to treat the following medical conditions:  Hemochromatosis. This is a condition in which the blood contains too much iron.  Polycythemia vera. This is a condition in which the blood contains too many red blood cells.  Porphyria cutanea tarda. This is a disease in which an important part of hemoglobin is not made properly. It results in the buildup of abnormal amounts of porphyrins in the body.  Sickle cell disease. This is a condition in which the red blood cells form an abnormal crescent shape rather than a round shape. Tell a health care provider about:  Any allergies you have.  All medicines you are taking, including vitamins, herbs, eye drops, creams, and over-the-counter medicines.  Any problems you or family members have had with anesthetic medicines.  Any blood disorders you have.  Any surgeries you have had.  Any medical conditions you have. What are the risks? Generally, this is a safe procedure. However, problems may occur, including:  Nausea or light-headedness.  Low blood pressure.  Soreness, bleeding, swelling, or bruising at the needle insertion site.  Infection. What happens before the procedure?  Follow instructions from your health care provider about eating or drinking restrictions.  Ask your health care provider about changing or stopping your regular medicines. This is especially important if you are taking diabetes medicines or blood thinners.  Wear clothing with sleeves that can be raised above the elbow.  Plan to have someone take you home after the procedure.  You may have a blood sample taken. What happens  during the procedure?  A needle will be inserted into one of your veins.  Tubing and a collection bag will be attached to that needle.  Blood will flow through the needle and tubing into the collection bag.  You may be asked to open and close your hand slowly and continually during the entire collection.  After the specified amount of blood has been removed from your body, the collection bag and tubing will be clamped.  The needle will be removed from your vein.  Pressure will be held on the site of the needle insertion to stop the bleeding.  A bandage (dressing) will be placed over the needle insertion site. The procedure may vary among health care providers and hospitals. What happens after the procedure?  Your recovery will be assessed and monitored.  You can return to your normal activities as directed by your health care provider. This information is not intended to replace advice given to you by your health care provider. Make sure you discuss any questions you have with your health care provider. Document Released: 10/29/2010 Document Revised: 01/27/2016 Document Reviewed: 05/23/2014 Elsevier Interactive Patient Education  2017 Elsevier Inc.  

## 2016-06-28 NOTE — Progress Notes (Signed)
Patient's HCT 45.5.  Orders are to keep HCT "around 45".  Patent in favor of having phlebotomy today d/t to continuation of symptoms of fatigue and h/a.  515 grams removed via right AC with 16g needle x 1 attempt.  Pt tolerated well.  Drink provided during 30 min obs period.

## 2016-07-17 ENCOUNTER — Telehealth: Payer: Self-pay | Admitting: *Deleted

## 2016-07-17 ENCOUNTER — Ambulatory Visit (HOSPITAL_BASED_OUTPATIENT_CLINIC_OR_DEPARTMENT_OTHER): Payer: 59

## 2016-07-17 ENCOUNTER — Ambulatory Visit (HOSPITAL_BASED_OUTPATIENT_CLINIC_OR_DEPARTMENT_OTHER): Payer: 59 | Admitting: Oncology

## 2016-07-17 ENCOUNTER — Other Ambulatory Visit (HOSPITAL_BASED_OUTPATIENT_CLINIC_OR_DEPARTMENT_OTHER): Payer: 59

## 2016-07-17 ENCOUNTER — Telehealth: Payer: Self-pay | Admitting: Oncology

## 2016-07-17 VITALS — BP 121/71 | HR 100 | Temp 97.9°F | Resp 18 | Wt 236.1 lb

## 2016-07-17 VITALS — BP 134/86 | HR 79

## 2016-07-17 DIAGNOSIS — D45 Polycythemia vera: Secondary | ICD-10-CM

## 2016-07-17 DIAGNOSIS — G4459 Other complicated headache syndrome: Secondary | ICD-10-CM | POA: Diagnosis not present

## 2016-07-17 LAB — CBC WITH DIFFERENTIAL/PLATELET
BASO%: 3.4 % — ABNORMAL HIGH (ref 0.0–2.0)
Basophils Absolute: 0.3 10*3/uL — ABNORMAL HIGH (ref 0.0–0.1)
EOS%: 4.9 % (ref 0.0–7.0)
Eosinophils Absolute: 0.4 10*3/uL (ref 0.0–0.5)
HCT: 45.4 % (ref 34.8–46.6)
HGB: 13.5 g/dL (ref 11.6–15.9)
LYMPH%: 18.4 % (ref 14.0–49.7)
MCH: 22.1 pg — ABNORMAL LOW (ref 25.1–34.0)
MCHC: 29.7 g/dL — ABNORMAL LOW (ref 31.5–36.0)
MCV: 74.3 fL — ABNORMAL LOW (ref 79.5–101.0)
MONO#: 0.3 10*3/uL (ref 0.1–0.9)
MONO%: 3.7 % (ref 0.0–14.0)
NEUT#: 6.3 10*3/uL (ref 1.5–6.5)
NEUT%: 69.6 % (ref 38.4–76.8)
Platelets: 621 10*3/uL — ABNORMAL HIGH (ref 145–400)
RBC: 6.11 10*6/uL — ABNORMAL HIGH (ref 3.70–5.45)
RDW: 18.4 % — ABNORMAL HIGH (ref 11.2–14.5)
WBC: 9 10*3/uL (ref 3.9–10.3)
lymph#: 1.7 10*3/uL (ref 0.9–3.3)
nRBC: 0 % (ref 0–0)

## 2016-07-17 NOTE — Progress Notes (Signed)
Pt accessed with phlebotomy needle for phlebotomy.  Tol well, 600 gm obtained.  Pt reports eating & drinking recently & is drinking water now.  Refused any other sustenance. IV site unremarkable.  Observed 30 Min.

## 2016-07-17 NOTE — Patient Instructions (Signed)

## 2016-07-17 NOTE — Progress Notes (Signed)
Hematology and Oncology Follow Up Visit  MEGIN PAXTON [id number] 03-Jun-1971 46 y.o. 07/17/2016 3:12 PM Leonard Downing, MDElkins, Curt Jews, *   Principle Diagnosis: 46 year old with polycythemia vera diagnosed in September 2017. She presented with hemoglobin of 19, erythropoietin within normal range and positive JAK2 mutation.  Current therapy: Aspirin 325 mg daily. Therapeutic phlebotomy every 2 weeks to keep her hematocrit less than 45. This will be switched to every 3 weeks starting on December 2017. Phlebotomy schedule will be changed to every 4 weeks starting in February 2018.  Interim History: Ms. Schor presents today for a follow-up visit. Since the last visit, she has been receiving phlebotomy every 3 weeks and off tolerated it well. She has reported some joint pain that has improved with phlebotomy. She tolerated this procedure fairly well without any other complaints. She continues to have frontal headaches that have been persistent on a regular basis. Her headaches described as pressure on the frontal and temporal area without any neurological deficits. She still able to ambulate and attends to activities of daily living. He denies any chest pain or difficulty breathing.    She does not report any blurry vision, syncope or seizures. She denied any fevers, chills, sweats or joint swelling. She does report myalgias as described. She does not report any chest pain, orthopnea or leg edema. She does not report any cough, wheezing or hemoptysis. She does not report any nausea, vomiting, abdominal pain, hematochezia or melena. She does not report any frequency urgency or hesitancy. She does not report any pathological fractures. She does not report any lymphadenopathy or petechiae. Remaining review of systems unremarkable.   Medications: I have reviewed the patient's current medications.  Current Outpatient Prescriptions  Medication Sig Dispense Refill  . aspirin EC 325 MG  tablet Take 1 tablet (325 mg total) by mouth 2 (two) times daily. 84 tablet 0  . traMADol (ULTRAM) 50 MG tablet Take 50 mg by mouth every 6 (six) hours as needed for moderate pain.     No current facility-administered medications for this visit.      Allergies: No Known Allergies  Past Medical History, Surgical history, Social history, and Family History were reviewed and updated.  Physical Exam: Blood pressure 121/71, pulse 100, temperature 97.9 F (36.6 C), temperature source Oral, resp. rate 18, weight 236 lb 1.6 oz (107.1 kg), SpO2 100 %. ECOG: 0 General appearance: Well-appearing woman without distress. Head: Normocephalic, without obvious abnormality no oral ulcers or lesions. Neck: no adenopathy Lymph nodes: Cervical, supraclavicular, and axillary nodes normal. Heart:regular rate and rhythm, S1, S2 normal, no murmur, click, rub or gallop Lung:chest clear, no wheezing, rales, normal symmetric air entry Abdomin: soft, non-tender, without masses or organomegaly no splenomegaly or shifting dullness. EXT:no erythema, induration, or nodules Logical exam: No deficits.  Lab Results: Lab Results  Component Value Date   WBC 9.0 07/17/2016   HGB 13.5 07/17/2016   HCT 45.4 07/17/2016   MCV 74.3 (L) 07/17/2016   PLT 621 (H) 07/17/2016     Chemistry      Component Value Date/Time   NA 140 02/20/2016 1432   K 4.1 02/20/2016 1432   CL 105 11/17/2015 0448   CO2 25 02/20/2016 1432   BUN 11.1 02/20/2016 1432   CREATININE 1.0 02/20/2016 1432      Component Value Date/Time   CALCIUM 9.8 02/20/2016 1432   ALKPHOS 115 02/20/2016 1432   AST 13 02/20/2016 1432   ALT 13 02/20/2016 1432   BILITOT 0.71  02/20/2016 1432      Impression and Plan:  46 year old woman with:  1. Polycythemia vera documented in September 2017. Her hemoglobin was 19.9 and noted to have positive JAK2 mutation. She also has an elevated platelet count and normal white cell count.  She is currently receiving  phlebotomy every 3 weeks with excellent control of her hemoglobin and symptoms. The plan is to switch her to phlebotomy every 4 weeks for the next 8 weeks and continue to taper her schedule to keep her hematocrit close to 45.  2. Thrombosis prophylaxis: She will continue on aspirin at this time indefinitely.  3. Cytoreductive therapy: The role of hydroxyurea was reviewed again. No indication for cytoreductive therapy at this time.  4. Persistent headaches: Unclear etiology at this time. Her polycythemia can cause headaches although her hemoglobin is actually improving and headaches or worsening. The plan is to obtain MRI of the brain to rule out any CNS pathology. Her headaches could be related to frequent phlebotomy and decreasing the schedule might help her symptoms.  5. Follow-up: Will be in 4 weeks to continue phlebotomy. She'll have clinical visit in 8 weeks.    [facility], MD 2/7/20183:12 PM as

## 2016-07-17 NOTE — Telephone Encounter (Signed)
Appointments scheduled per 2/7 LOS. Patient given AVS report and calendars with future scheduled appointments. °

## 2016-07-17 NOTE — Telephone Encounter (Signed)
Per 2/7 LOS and staff message I have scheduled appts and gave calendar

## 2016-07-29 ENCOUNTER — Ambulatory Visit (HOSPITAL_COMMUNITY)
Admission: RE | Admit: 2016-07-29 | Discharge: 2016-07-29 | Disposition: A | Discharge status: Home / Self Care | Payer: 59 | Source: Ambulatory Visit | Attending: Oncology | Admitting: Oncology

## 2016-07-29 DIAGNOSIS — G4459 Other complicated headache syndrome: Secondary | ICD-10-CM | POA: Insufficient documentation

## 2016-07-29 MED ORDER — GADOBENATE DIMEGLUMINE 529 MG/ML IV SOLN
20.0000 mL | Freq: Once | INTRAVENOUS | Status: AC | PRN
  Administered 2016-07-29: 20 mL via INTRAVENOUS

## 2016-07-30 ENCOUNTER — Telehealth: Payer: Self-pay | Admitting: *Deleted

## 2016-07-30 NOTE — Telephone Encounter (Signed)
As noted below by Dr. [name], I informed patient that her MRI is normal. Patient verbalized understanding.

## 2016-07-30 NOTE — Telephone Encounter (Signed)
-----   Message from [name], MD sent at 07/30/2016  8:38 AM EST ----- Please let her know her MRI is normal.

## 2016-08-14 ENCOUNTER — Other Ambulatory Visit (HOSPITAL_BASED_OUTPATIENT_CLINIC_OR_DEPARTMENT_OTHER): Payer: 59

## 2016-08-14 ENCOUNTER — Ambulatory Visit: Payer: 59

## 2016-08-14 DIAGNOSIS — D45 Polycythemia vera: Secondary | ICD-10-CM | POA: Diagnosis not present

## 2016-08-14 LAB — CBC WITH DIFFERENTIAL/PLATELET
BASO%: 4.1 % — ABNORMAL HIGH (ref 0.0–2.0)
Basophils Absolute: 0.4 10*3/uL — ABNORMAL HIGH (ref 0.0–0.1)
EOS%: 5.3 % (ref 0.0–7.0)
Eosinophils Absolute: 0.6 10*3/uL — ABNORMAL HIGH (ref 0.0–0.5)
HCT: 40.4 % (ref 34.8–46.6)
HGB: 12.3 g/dL (ref 11.6–15.9)
LYMPH%: 17.3 % (ref 14.0–49.7)
MCH: 20.9 pg — ABNORMAL LOW (ref 25.1–34.0)
MCHC: 30.5 g/dL — ABNORMAL LOW (ref 31.5–36.0)
MCV: 68.4 fL — ABNORMAL LOW (ref 79.5–101.0)
MONO#: 0.5 10*3/uL (ref 0.1–0.9)
MONO%: 5 % (ref 0.0–14.0)
NEUT#: 7.4 10*3/uL — ABNORMAL HIGH (ref 1.5–6.5)
NEUT%: 68.3 % (ref 38.4–76.8)
Platelets: 743 10*3/uL — ABNORMAL HIGH (ref 145–400)
RBC: 5.91 10*6/uL — ABNORMAL HIGH (ref 3.70–5.45)
RDW: 20.7 % — ABNORMAL HIGH (ref 11.2–14.5)
WBC: 10.9 10*3/uL — ABNORMAL HIGH (ref 3.9–10.3)
lymph#: 1.9 10*3/uL (ref 0.9–3.3)

## 2016-08-14 NOTE — Progress Notes (Signed)
Patient here for possible phlebotomy.  Patient's Hct today is 40.4.   Phlebotomy not needed.  Discussed with patient along with next appointments.  Let Dr. [name] know .  Also let him know platelets elevated at 743K.

## 2016-08-22 ENCOUNTER — Telehealth: Payer: Self-pay

## 2016-08-22 NOTE — Telephone Encounter (Signed)
She needs to discuss this her PCP

## 2016-08-22 NOTE — Telephone Encounter (Signed)
Pt called stating she has mentioned her headaches to Dr [name] in the past. She was in [facility] this past weekend for headache. She is asking if Dr [name] will make her a neurology referral.

## 2016-08-22 NOTE — Telephone Encounter (Signed)
s/w pt to call her PCP per Dr [name].

## 2016-09-11 ENCOUNTER — Telehealth: Payer: Self-pay | Admitting: Oncology

## 2016-09-11 ENCOUNTER — Ambulatory Visit (HOSPITAL_BASED_OUTPATIENT_CLINIC_OR_DEPARTMENT_OTHER): Payer: 59 | Admitting: Oncology

## 2016-09-11 ENCOUNTER — Other Ambulatory Visit (HOSPITAL_BASED_OUTPATIENT_CLINIC_OR_DEPARTMENT_OTHER): Payer: 59

## 2016-09-11 VITALS — BP 119/70 | HR 74 | Temp 97.9°F | Resp 18 | Ht 64.0 in | Wt 217.9 lb

## 2016-09-11 DIAGNOSIS — D45 Polycythemia vera: Secondary | ICD-10-CM

## 2016-09-11 LAB — CBC WITH DIFFERENTIAL/PLATELET
BASO%: 4.1 % — ABNORMAL HIGH (ref 0.0–2.0)
Basophils Absolute: 0.5 10*3/uL — ABNORMAL HIGH (ref 0.0–0.1)
EOS%: 5.7 % (ref 0.0–7.0)
Eosinophils Absolute: 0.7 10*3/uL — ABNORMAL HIGH (ref 0.0–0.5)
HCT: 43.6 % (ref 34.8–46.6)
HGB: 13.3 g/dL (ref 11.6–15.9)
LYMPH%: 18.6 % (ref 14.0–49.7)
MCH: 20.3 pg — ABNORMAL LOW (ref 25.1–34.0)
MCHC: 30.4 g/dL — ABNORMAL LOW (ref 31.5–36.0)
MCV: 66.8 fL — ABNORMAL LOW (ref 79.5–101.0)
MONO#: 0.6 10*3/uL (ref 0.1–0.9)
MONO%: 4.5 % (ref 0.0–14.0)
NEUT#: 8.4 10*3/uL — ABNORMAL HIGH (ref 1.5–6.5)
NEUT%: 67.1 % (ref 38.4–76.8)
Platelets: 857 10*3/uL — ABNORMAL HIGH (ref 145–400)
RBC: 6.52 10*6/uL — ABNORMAL HIGH (ref 3.70–5.45)
RDW: 21.4 % — ABNORMAL HIGH (ref 11.2–14.5)
WBC: 12.5 10*3/uL — ABNORMAL HIGH (ref 3.9–10.3)
lymph#: 2.3 10*3/uL (ref 0.9–3.3)

## 2016-09-11 NOTE — Telephone Encounter (Signed)
Gave patient AVS and calender per 09/11/2016 los. Per los unable to schedule lab/md/phlebotomy on 8/4 because its a Saturday . Scheduled appts ever 4 weeks per 09/11/2016 los.

## 2016-09-11 NOTE — Progress Notes (Signed)
Hematology and Oncology Follow Up Visit  Eileen [name] [id number] 07-24-1970 46 y.o. 09/11/2016 2:49 PM Leonard Downing, MDElkins, Curt Jews, *   Principle Diagnosis: 46 year old with polycythemia vera diagnosed in September 2017. She presented with hemoglobin of 19, erythropoietin within normal range and positive JAK2 mutation.  Current therapy: Aspirin 325 mg daily. Therapeutic phlebotomy every 2 weeks to keep her hematocrit less than 45. This will be switched to every 3 weeks starting on December 2017. Phlebotomy schedule will be changed to every 4 weeks starting in February 2018.  Interim History: [name] presents today for a follow-up visit. Since the last visit, she had some issues with migraine headaches and have been started on propranolol which have helped her symptoms. She did not require any phlebotomy in March 2018 and she reports feeling reasonably well at this time. She reports the frequency of her migraine headaches have decreased as well as the severity. She also has not reported any bleeding or thrombosis episodes. She denied any constitutional symptoms or weight loss. She denied any arthralgias or myalgias. He continues to perform activities of daily living without any decline.   She does not report any blurry vision, syncope or seizures. She denied any fevers, chills, sweats or joint swelling. She does report myalgias as described. She does not report any chest pain, orthopnea or leg edema. She does not report any cough, wheezing or hemoptysis. She does not report any nausea, vomiting, abdominal pain, hematochezia or melena. She does not report any frequency urgency or hesitancy. She does not report any pathological fractures. She does not report any lymphadenopathy or petechiae. Remaining review of systems unremarkable.   Medications: I have reviewed the patient's current medications.  Current Outpatient Prescriptions  Medication Sig Dispense Refill  . aspirin  EC 325 MG tablet Take 1 tablet (325 mg total) by mouth 2 (two) times daily. 84 tablet 0  . traMADol (ULTRAM) 50 MG tablet Take 50 mg by mouth every 6 (six) hours as needed for moderate pain.     No current facility-administered medications for this visit.      Allergies: No Known Allergies  Past Medical History, Surgical history, Social history, and Family History were reviewed and updated.  Physical Exam: Blood pressure 119/70, pulse 74, temperature 97.9 F (36.6 C), temperature source Oral, resp. rate 18, height 5\' 4"  (1.626 m), weight 217 lb 14.4 oz (98.8 kg), SpO2 100 %. ECOG: 0 General appearance: Alert, awake: Without distress. Head: Normocephalic, without obvious abnormality no oral thrush or ulcers. Neck: no adenopathy Lymph nodes: Cervical, supraclavicular, and axillary nodes normal. Heart:regular rate and rhythm, S1, S2 normal, no murmur, click, rub or gallop Lung:chest clear, no wheezing, rales, normal symmetric air entry Abdomin: soft, non-tender, without masses or organomegaly no rebound or guarding. EXT:no erythema, induration, or nodules Logical exam: No deficits.  Lab Results: Lab Results  Component Value Date   WBC 12.5 (H) 09/11/2016   HGB 13.3 09/11/2016   HCT 43.6 09/11/2016   MCV 66.8 (L) 09/11/2016   PLT 857 (H) 09/11/2016     Chemistry      Component Value Date/Time   NA 140 02/20/2016 1432   K 4.1 02/20/2016 1432   CL 105 11/17/2015 0448   CO2 25 02/20/2016 1432   BUN 11.1 02/20/2016 1432   CREATININE 1.0 02/20/2016 1432      Component Value Date/Time   CALCIUM 9.8 02/20/2016 1432   ALKPHOS 115 02/20/2016 1432   AST 13 02/20/2016 1432   ALT  13 02/20/2016 1432   BILITOT 0.71 02/20/2016 1432      Impression and Plan:  46 year old woman with:  1. Polycythemia vera documented in September 2017. Her hemoglobin was 19.9 and noted to have positive JAK2 mutation. She also has an elevated platelet count and normal white cell count.  She is  currently receiving phlebotomy every 4 weeks to keep her hematocrit less than 45. She will not receive phlebotomy today but will continue on the every month schedule for the time being.  2. Thrombosis prophylaxis: She will continue on aspirin at this time indefinitely.  3. Cytoreductive therapy: The role of hydroxyurea was discussed again especially in the setting of her thrombocytosis. Her thrombosis risk is low at this time given her young age in no previous history of thrombosis. We will defer hydroxyurea at least for the time being.  4. Persistent headaches: Appears to have improved at this time with migraine regimen.  5. Follow-up: Will be in 4 weeks to continue phlebotomy. She will have clinical visit in 4 months.    Zola Button, MD 4/4/20182:49 PM as

## 2016-10-09 ENCOUNTER — Other Ambulatory Visit (HOSPITAL_BASED_OUTPATIENT_CLINIC_OR_DEPARTMENT_OTHER): Payer: 59

## 2016-10-09 ENCOUNTER — Ambulatory Visit: Payer: 59

## 2016-10-09 DIAGNOSIS — D45 Polycythemia vera: Secondary | ICD-10-CM

## 2016-10-09 LAB — CBC WITH DIFFERENTIAL/PLATELET
BASO%: 4.2 % — ABNORMAL HIGH (ref 0.0–2.0)
Basophils Absolute: 0.5 10*3/uL — ABNORMAL HIGH (ref 0.0–0.1)
EOS%: 4.6 % (ref 0.0–7.0)
Eosinophils Absolute: 0.5 10*3/uL (ref 0.0–0.5)
HCT: 42.5 % (ref 34.8–46.6)
HGB: 12.9 g/dL (ref 11.6–15.9)
LYMPH%: 18.7 % (ref 14.0–49.7)
MCH: 20.1 pg — ABNORMAL LOW (ref 25.1–34.0)
MCHC: 30.4 g/dL — ABNORMAL LOW (ref 31.5–36.0)
MCV: 66.2 fL — ABNORMAL LOW (ref 79.5–101.0)
MONO#: 0.5 10*3/uL (ref 0.1–0.9)
MONO%: 4.5 % (ref 0.0–14.0)
NEUT#: 7.4 10*3/uL — ABNORMAL HIGH (ref 1.5–6.5)
NEUT%: 68 % (ref 38.4–76.8)
Platelets: 773 10*3/uL — ABNORMAL HIGH (ref 145–400)
RBC: 6.42 10*6/uL — ABNORMAL HIGH (ref 3.70–5.45)
RDW: 21.4 % — ABNORMAL HIGH (ref 11.2–14.5)
WBC: 10.8 10*3/uL — ABNORMAL HIGH (ref 3.9–10.3)
lymph#: 2 10*3/uL (ref 0.9–3.3)

## 2016-11-06 ENCOUNTER — Other Ambulatory Visit (HOSPITAL_BASED_OUTPATIENT_CLINIC_OR_DEPARTMENT_OTHER): Payer: 59

## 2016-11-06 DIAGNOSIS — D45 Polycythemia vera: Secondary | ICD-10-CM

## 2016-11-06 LAB — CBC WITH DIFFERENTIAL/PLATELET
BASO%: 3.4 % — ABNORMAL HIGH (ref 0.0–2.0)
Basophils Absolute: 0.4 10*3/uL — ABNORMAL HIGH (ref 0.0–0.1)
EOS%: 4.9 % (ref 0.0–7.0)
Eosinophils Absolute: 0.5 10*3/uL (ref 0.0–0.5)
HCT: 43.2 % (ref 34.8–46.6)
HGB: 12.5 g/dL (ref 11.6–15.9)
LYMPH%: 13.4 % — ABNORMAL LOW (ref 14.0–49.7)
MCH: 19.9 pg — ABNORMAL LOW (ref 25.1–34.0)
MCHC: 28.9 g/dL — ABNORMAL LOW (ref 31.5–36.0)
MCV: 68.7 fL — ABNORMAL LOW (ref 79.5–101.0)
MONO#: 0.5 10*3/uL (ref 0.1–0.9)
MONO%: 4.1 % (ref 0.0–14.0)
NEUT#: 8.2 10*3/uL — ABNORMAL HIGH (ref 1.5–6.5)
NEUT%: 74.2 % (ref 38.4–76.8)
Platelets: 792 10*3/uL — ABNORMAL HIGH (ref 145–400)
RBC: 6.29 10*6/uL — ABNORMAL HIGH (ref 3.70–5.45)
RDW: 21.7 % — ABNORMAL HIGH (ref 11.2–14.5)
WBC: 11.1 10*3/uL — ABNORMAL HIGH (ref 3.9–10.3)
lymph#: 1.5 10*3/uL (ref 0.9–3.3)
nRBC: 0 % (ref 0–0)

## 2016-11-28 ENCOUNTER — Encounter: Payer: Self-pay | Admitting: Neurology

## 2016-11-28 ENCOUNTER — Telehealth: Payer: Self-pay | Admitting: Neurology

## 2016-11-28 ENCOUNTER — Ambulatory Visit (INDEPENDENT_AMBULATORY_CARE_PROVIDER_SITE_OTHER): Payer: 59 | Admitting: Neurology

## 2016-11-28 VITALS — BP 130/84 | HR 73 | Ht 63.0 in | Wt 193.0 lb

## 2016-11-28 DIAGNOSIS — H53132 Sudden visual loss, left eye: Secondary | ICD-10-CM | POA: Diagnosis not present

## 2016-11-28 DIAGNOSIS — G43711 Chronic migraine without aura, intractable, with status migrainosus: Secondary | ICD-10-CM | POA: Diagnosis not present

## 2016-11-28 DIAGNOSIS — R51 Headache with orthostatic component, not elsewhere classified: Secondary | ICD-10-CM

## 2016-11-28 MED ORDER — TOPIRAMATE ER 100 MG PO SPRINKLE CAP24: 100.0000 mg | EXTENDED_RELEASE_CAPSULE | Freq: Every day | ORAL | 11 refills | Status: DC

## 2016-11-28 MED ORDER — PROCHLORPERAZINE MALEATE 5 MG PO TABS: 5.0000 mg | ORAL_TABLET | Freq: Three times a day (TID) | ORAL | 3 refills | Status: DC | PRN

## 2016-11-28 MED ORDER — SUMATRIPTAN SUCCINATE 100 MG PO TABS: 100.0000 mg | ORAL_TABLET | Freq: Once | ORAL | 12 refills | Status: DC | PRN

## 2016-11-28 NOTE — Patient Instructions (Addendum)
Remember to drink plenty of fluid, eat healthy meals and do not skip any meals. Try to eat protein with a every meal and eat a healthy snack such as fruit or nuts in between meals. Try to keep a regular sleep-wake schedule and try to exercise daily, particularly in the form of walking, 20-30 minutes a day, if you can.   As far as your medications are concerned, I would like to suggest  At onset of headache: Take Sumatriptan (Imitrex) and prochloperazine(Compazine) at onset of headache. If it does not improve the symptoms please take one additional Sumatriptan. Do not take more then 2 Sumatriptan tablets in 24hrs.  Start Topirmate ER 25mg  at bedtime: 25mg  x 5=7 days, 50mg  x 5-7 days, 100mg . May need to increase further.  As far as diagnostic testing: MRI of the brain w/wo contrast, Ophthalmology Dr. Katy Fitch  I would like to see you back in 3 months, sooner if we need to. Please call us with any interim questions, concerns, problems, updates or refill requests.   Our phone number is [phone]. We also have an after hours call service for urgent matters and there is a physician on-call for urgent questions. For any emergencies you know to call 911 or go to the nearest emergency room  Prochlorperazine tablets What is this medicine? PROCHLORPERAZINE (proe klor PER a zeen) helps to control severe nausea and vomiting. This medicine is also used to treat schizophrenia. It can also help patients who experience anxiety that is not due to psychological illness. This medicine may be used for other purposes; ask your health care provider or pharmacist if you have questions. COMMON BRAND NAME(S): Compazine What should I tell my health care provider before I take this medicine? They need to know if you have any of these conditions: -blood disorders or disease -dementia -liver disease or jaundice -Parkinson's disease -uncontrollable movement disorder -an unusual or allergic reaction to prochlorperazine,  other medicines, foods, dyes, or preservatives -pregnant or trying to get pregnant -breast-feeding How should I use this medicine? Take this medicine by mouth with a glass of water. Follow the directions on the prescription label. Take your doses at regular intervals. Do not take your medicine more often than directed. Do not stop taking this medicine suddenly. This can cause nausea, vomiting, and dizziness. Ask your doctor or health care professional for advice. Talk to your pediatrician regarding the use of this medicine in children. Special care may be needed. While this drug may be prescribed for children as young as 2 years for selected conditions, precautions do apply. Overdosage: If you think you have taken too much of this medicine contact a poison control center or emergency room at once. NOTE: This medicine is only for you. Do not share this medicine with others. What if I miss a dose? If you miss a dose, take it as soon as you can. If it is almost time for your next dose, take only that dose. Do not take double or extra doses. What may interact with this medicine? Do not take this medicine with any of the following medications: -amoxapine -antidepressants like citalopram, escitalopram, fluoxetine, paroxetine, and sertraline -deferoxamine -dofetilide -maprotiline -tricyclic antidepressants like amitriptyline, clomipramine, imipramine, nortiptyline and others This medicine may also interact with the following medications: -lithium -medicines for pain -phenytoin -propranolol -warfarin This list may not describe all possible interactions. Give your health care provider a list of all the medicines, herbs, non-prescription drugs, or dietary supplements you use. Also tell them if you  smoke, drink alcohol, or use illegal drugs. Some items may interact with your medicine. What should I watch for while using this medicine? Visit your doctor or health care professional for regular checks on  your progress. You may get drowsy or dizzy. Do not drive, use machinery, or do anything that needs mental alertness until you know how this medicine affects you. Do not stand or sit up quickly, especially if you are an older patient. This reduces the risk of dizzy or fainting spells. Alcohol may interfere with the effect of this medicine. Avoid alcoholic drinks. This medicine can reduce the response of your body to heat or cold. Dress warm in cold weather and stay hydrated in hot weather. If possible, avoid extreme temperatures like saunas, hot tubs, very hot or cold showers, or activities that can cause dehydration such as vigorous exercise. This medicine can make you more sensitive to the sun. Keep out of the sun. If you cannot avoid being in the sun, wear protective clothing and use sunscreen. Do not use sun lamps or tanning beds/booths. Your mouth may get dry. Chewing sugarless gum or sucking hard candy, and drinking plenty of water may help. Contact your doctor if the problem does not go away or is severe. What side effects may I notice from receiving this medicine? Side effects that you should report to your doctor or health care professional as soon as possible: -blurred vision -breast enlargement in men or women -breast milk in women who are not breast-feeding -chest pain, fast or irregular heartbeat -confusion, restlessness -dark yellow or brown urine -difficulty breathing or swallowing -dizziness or fainting spells -drooling, shaking, movement difficulty (shuffling walk) or rigidity -fever, chills, sore throat -involuntary or uncontrollable movements of the eyes, mouth, head, arms, and legs -seizures -stomach area pain -unusually weak or tired -unusual bleeding or bruising -yellowing of skin or eyes Side effects that usually do not require medical attention (report to your doctor or health care professional if they continue or are bothersome): -difficulty passing urine -difficulty  sleeping -headache -sexual dysfunction -skin rash, or itching This list may not describe all possible side effects. Call your doctor for medical advice about side effects. You may report side effects to FDA at 1-800-FDA-1088. Where should I keep my medicine? Keep out of the reach of children. Store at room temperature between 15 and 30 degrees C (59 and 86 degrees F). Protect from light. Throw away any unused medicine after the expiration date. NOTE: This sheet is a summary. It may not cover all possible information. If you have questions about this medicine, talk to your doctor, pharmacist, or health care provider.  2018 [employer] Standard (2011-10-15 16:59:39)   Topiramate extended-release capsules What is this medicine? TOPIRAMATE (toe PYRE a mate) is used to treat seizures in adults or children with epilepsy. It is also used for the prevention of migraine headaches. This medicine may be used for other purposes; ask your health care provider or pharmacist if you have questions. COMMON BRAND NAME(S): Trokendi XR What should I tell my health care provider before I take this medicine? They need to know if you have any of these conditions: -cirrhosis of the liver or liver disease -diarrhea -glaucoma -kidney stones or kidney disease -lung disease like asthma, obstructive pulmonary disease, emphysema -metabolic acidosis -on a ketogenic diet -scheduled for surgery or a procedure -suicidal thoughts, plans, or attempt; a previous suicide attempt by you or a family member -an unusual or allergic reaction to topiramate, other medicines, foods,  dyes, or preservatives -pregnant or trying to get pregnant -breast-feeding How should I use this medicine? Take this medicine by mouth with a glass of water. Follow the directions on the prescription label. Trokendi XR capsules must be swallowed whole. Do not sprinkle on food, break, crush, dissolve, or chew. Qudexy XR capsules may be swallowed  whole or opened and sprinkled on a small amount of soft food. This mixture must be swallowed immediately. Do not chew or store mixture for later use. You may take this medicine with meals. Take your medicine at regular intervals. Do not take it more often than directed. Talk to your pediatrician regarding the use of this medicine in children. Special care may be needed. While Trokendi XR may be prescribed for children as young as 6 years and Qudexy XR may be prescribed for children as young as 2 years for selected conditions, precautions do apply. Overdosage: If you think you have taken too much of this medicine contact a poison control center or emergency room at once. NOTE: This medicine is only for you. Do not share this medicine with others. What if I miss a dose? If you miss a dose, take it as soon as you can. If it is almost time for your next dose, take only that dose. Do not take double or extra doses. What may interact with this medicine? Do not take this medicine with any of the following medications: -probenecid This medicine may also interact with the following medications: -acetazolamide -alcohol -amitriptyline -birth control pills -digoxin -hydrochlorothiazide -lithium -medicines for pain, sleep, or muscle relaxation -metformin -methazolamide -other seizure or epilepsy medicines -pioglitazone -risperidone This list may not describe all possible interactions. Give your health care provider a list of all the medicines, herbs, non-prescription drugs, or dietary supplements you use. Also tell them if you smoke, drink alcohol, or use illegal drugs. Some items may interact with your medicine. What should I watch for while using this medicine? Visit your doctor or health care professional for regular checks on your progress. Do not stop taking this medicine suddenly. This increases the risk of seizures if you are using this medicine to control epilepsy. Wear a medical identification  bracelet or chain to say you have epilepsy or seizures, and carry a card that lists all your medicines. This medicine can decrease sweating and increase your body temperature. Watch for signs of deceased sweating or fever, especially in children. Avoid extreme heat, hot baths, and saunas. Be careful about exercising, especially in hot weather. Contact your health care provider right away if you notice a fever or decrease in sweating. You should drink plenty of fluids while taking this medicine. If you have had kidney stones in the past, this will help to reduce your chances of forming kidney stones. If you have stomach pain, with nausea or vomiting and yellowing of your eyes or skin, call your doctor immediately. You may get drowsy, dizzy, or have blurred vision. Do not drive, use machinery, or do anything that needs mental alertness until you know how this medicine affects you. To reduce dizziness, do not sit or stand up quickly, especially if you are an older patient. Alcohol can increase drowsiness and dizziness. Avoid alcoholic drinks. Do not drink alcohol for 6 hours before or 6 hours after taking Trokendi XR. If you notice blurred vision, eye pain, or other eye problems, seek medical attention at once for an eye exam. The use of this medicine may increase the chance of suicidal thoughts or actions.  Pay special attention to how you are responding while on this medicine. Any worsening of mood, or thoughts of suicide or dying should be reported to your health care professional right away. This medicine may increase the chance of developing metabolic acidosis. If left untreated, this can cause kidney stones, bone disease, or slowed growth in children. Symptoms include breathing fast, fatigue, loss of appetite, irregular heartbeat, or loss of consciousness. Call your doctor immediately if you experience any of these side effects. Also, tell your doctor about any surgery you plan on having while taking this  medicine since this may increase your risk for metabolic acidosis. Birth control pills may not work properly while you are taking this medicine. Talk to your doctor about using an extra method of birth control. Women who become pregnant while using this medicine may enroll in the [place] Pregnancy Registry by calling [phone]. This registry collects information about the safety of antiepileptic drug use during pregnancy. What side effects may I notice from receiving this medicine? Side effects that you should report to your doctor or health care professional as soon as possible: -allergic reactions like skin rash, itching or hives, swelling of the face, lips, or tongue -decreased sweating and/or rise in body temperature -depression -difficulty breathing, fast or irregular breathing patterns -difficulty speaking -difficulty walking or controlling muscle movements -hearing impairment -redness, blistering, peeling or loosening of the skin, including inside the mouth -tingling, pain or numbness in the hands or feet -unusually weak or tired -worsening of mood, thoughts or actions of suicide or dying Side effects that usually do not require medical attention (report to your doctor or health care professional if they continue or are bothersome): -altered taste -back pain, joint or muscle aches and pains -diarrhea, or constipation -headache -loss of appetite -nausea -stomach upset, indigestion -tremors This list may not describe all possible side effects. Call your doctor for medical advice about side effects. You may report side effects to FDA at 1-800-FDA-1088. Where should I keep my medicine? Keep out of the reach of children. Store at room temperature between 15 and 30 degrees C (59 and 86 degrees F) in a tightly closed container. Protect from moisture. Throw away any unused medicine after the expiration date. NOTE: This sheet is a summary. It may not cover  all possible information. If you have questions about this medicine, talk to your doctor, pharmacist, or health care provider.  2018 Elsevier/Gold Standard (2015-09-15 12:33:11)   Sumatriptan tablets What is this medicine? SUMATRIPTAN (soo ma TRIP tan) is used to treat migraines with or without aura. An aura is a strange feeling or visual disturbance that warns you of an attack. It is not used to prevent migraines. This medicine may be used for other purposes; ask your health care provider or pharmacist if you have questions. COMMON BRAND NAME(S): Imitrex, Migraine Pack What should I tell my health care provider before I take this medicine? They need to know if you have any of these conditions: -circulation problems in fingers and toes -diabetes -heart disease -high blood pressure -high cholesterol -history of irregular heartbeat -history of stroke -kidney disease -liver disease -postmenopausal or surgical removal of uterus and ovaries -seizures -smoke tobacco -stomach or intestine problems -an unusual or allergic reaction to sumatriptan, other medicines, foods, dyes, or preservatives -pregnant or trying to get pregnant -breast-feeding How should I use this medicine? Take this medicine by mouth with a glass of water. Follow the directions on the prescription label. This medicine  is taken at the first symptoms of a migraine. It is not for everyday use. If your migraine headache returns after one dose, you can take another dose as directed. You must leave at least 2 hours between doses, and do not take more than 100 mg as a single dose. Do not take more than 200 mg total in any 24 hour period. If there is no improvement at all after the first dose, do not take a second dose without talking to your doctor or health care professional. Do not take your medicine more often than directed. Talk to your pediatrician regarding the use of this medicine in children. Special care may be  needed. Overdosage: If you think you have taken too much of this medicine contact a poison control center or emergency room at once. NOTE: This medicine is only for you. Do not share this medicine with others. What if I miss a dose? This does not apply; this medicine is not for regular use. What may interact with this medicine? Do not take this medicine with any of the following medicines: -cocaine -ergot alkaloids like dihydroergotamine, ergonovine, ergotamine, methylergonovine -feverfew -MAOIs like Carbex, Eldepryl, Marplan, Nardil, and Parnate -other medicines for migraine headache like almotriptan, eletriptan, frovatriptan, naratriptan, rizatriptan, zolmitriptan -tryptophan This medicine may also interact with the following medications: -certain medicines for depression, anxiety, or psychotic disturbances This list may not describe all possible interactions. Give your health care provider a list of all the medicines, herbs, non-prescription drugs, or dietary supplements you use. Also tell them if you smoke, drink alcohol, or use illegal drugs. Some items may interact with your medicine. What should I watch for while using this medicine? Only take this medicine for a migraine headache. Take it if you get warning symptoms or at the start of a migraine attack. It is not for regular use to prevent migraine attacks. You may get drowsy or dizzy. Do not drive, use machinery, or do anything that needs mental alertness until you know how this medicine affects you. To reduce dizzy or fainting spells, do not sit or stand up quickly, especially if you are an older patient. Alcohol can increase drowsiness, dizziness and flushing. Avoid alcoholic drinks. Smoking cigarettes may increase the risk of heart-related side effects from using this medicine. If you take migraine medicines for 10 or more days a month, your migraines may get worse. Keep a diary of headache days and medicine use. Contact your  healthcare professional if your migraine attacks occur more frequently. What side effects may I notice from receiving this medicine? Side effects that you should report to your doctor or health care professional as soon as possible: -allergic reactions like skin rash, itching or hives, swelling of the face, lips, or tongue -bloody or watery diarrhea -hallucination, loss of contact with reality -pain, tingling, numbness in the face, hands, or feet -seizures -signs and symptoms of a blood clot such as breathing problems; changes in vision; chest pain; severe, sudden headache; pain, swelling, warmth in the leg; trouble speaking; sudden numbness or weakness of the face, arm, or leg -signs and symptoms of a dangerous change in heartbeat or heart rhythm like chest pain; dizziness; fast or irregular heartbeat; palpitations, feeling faint or lightheaded; falls; breathing problems -signs and symptoms of a stroke like changes in vision; confusion; trouble speaking or understanding; severe headaches; sudden numbness or weakness of the face, arm, or leg; trouble walking; dizziness; loss of balance or coordination -stomach pain Side effects that usually do not  require medical attention (report to your doctor or health care professional if they continue or are bothersome): -changes in taste -facial flushing -headache -muscle cramps -muscle pain -nausea, vomiting -weak or tired This list may not describe all possible side effects. Call your doctor for medical advice about side effects. You may report side effects to FDA at 1-800-FDA-1088. Where should I keep my medicine? Keep out of the reach of children. Store at room temperature between 2 and 30 degrees C (36 and 86 degrees F). Throw away any unused medicine after the expiration date. NOTE: This sheet is a summary. It may not cover all possible information. If you have questions about this medicine, talk to your doctor, pharmacist, or health care  provider.  2018 Elsevier/Gold Standard (2015-06-29 12:38:23)

## 2016-11-28 NOTE — Progress Notes (Addendum)
[facility]    Provider:  Dr Jaynee Eagles Referring Provider: Leonard Downing, * Primary Care Physician:  Leonard Downing, MD  CC:  Migraines  Addendum from Ophthalmology Dr. Katy Fitch: appt 11/28/2016: VF, OCT and dilated exam all normal. puttig in contacts brings her to 27/20 vision.   HPI:  Eileen [name] is a 46 y.o. female here as a referral from Dr. Arelia Sneddon for Migraines. PMHx polycythemia vera. She gets phlebotomy every 3 months.  She has had headaches for years since teenager. They start on the right side of the head in the temple behind the eyes, pouding and throbbing like someone jamming her eye, severe pain, nausea and vomiting, severe light sensitivity, perfumes makes it worse, hamburger can trigger. Dark room helps. Hypersensitive to sound. Phonophobia. She has 25 headache days a month, 10 a month are migrainous and the others are more tension in the back of the neck. Not usual with an aura, light flashes or wavy occasionally. Migraines can last up to 24 hours untreated. Storms trigger migraines.  She is having vision changes, acute vision loss.  Vision loss when laying down worse. Headaches worse when she bends over. No weight gain, she has lost about 50 pounds. No other focal neurologic deficits, associated symptoms, inciting events or modifiable factors.  Reviewed notes, labs and imaging from outside physicians, which showed:  Medications tried: Propranolol.   Review of Systems: Patient complains of symptoms per HPI as well as the following symptoms Headache, easy bruising. Pertinent negatives and positives per HPI. All others negative.   Social History   Social History  . Marital status: Married    Spouse name: N/A  . Number of children: 3  . Years of education: N/A   Occupational History  . [employer]   Social History Main Topics  . Smoking status: Never Smoker  . Smokeless tobacco: Never Used  . Alcohol use No  .  Drug use: No  . Sexual activity: Yes    Birth control/ protection: Surgical   Other Topics Concern  . Not on file   Social History Narrative   No caffeine     Family History  Problem Relation Age of Onset  . Migraines Mother   . Breast cancer Unknown        Great MA   . Colon cancer Neg Hx     Past Medical History:  Diagnosis Date  . Acute duodenal ulcer without mention of hemorrhage, perforation, or obstruction   . Dysrhythmia    History of palpations, for many years  . GERD (gastroesophageal reflux disease)   . Headache    takes Advil  . Reflux esophagitis     Past Surgical History:  Procedure Laterality Date  . ABDOMINAL HYSTERECTOMY  2011ish  . CESAREAN SECTION     x 3   . ORIF ANKLE FRACTURE Left 12/21/2014   Procedure: OPEN REDUCTION INTERNAL FIXATION (ORIF) LEFT LATERAL MALLEOLUS ANKLE FRACTURE;  Surgeon: Leandrew Koyanagi, MD;  Location: [facility];  Service: Orthopedics;  Laterality: Left;  Needs RNFA  . UPPER GI ENDOSCOPY      Current Outpatient Prescriptions  Medication Sig Dispense Refill  . aspirin EC 325 MG tablet Take 1 tablet (325 mg total) by mouth 2 (two) times daily. 84 tablet 0  . butalbital-aspirin-caffeine (FIORINAL) 50-325-40 MG capsule Take 1 capsule by mouth 2 (two) times daily as needed for headache.    Marland Kitchen PROPRANOLOL HCL PO Take by mouth.    Marland Kitchen  traMADol (ULTRAM) 50 MG tablet Take 50 mg by mouth every 6 (six) hours as needed for moderate pain.    Marland Kitchen prochlorperazine (COMPAZINE) 5 MG tablet Take 1 tablet (5 mg total) by mouth every 8 (eight) hours as needed for nausea. 30 tablet 3  . SUMAtriptan (IMITREX) 100 MG tablet Take 1 tablet (100 mg total) by mouth once as needed. May repeat in 2 hours if headache persists or recurs. 10 tablet 12  . Topiramate ER (QUDEXY XR) 100 MG CS24 Take 100 mg by mouth at bedtime. 30 each 11   No current facility-administered medications for this visit.     Allergies as of 11/28/2016  . (No Known Allergies)     Vitals: BP 130/84   Pulse 73   Ht 5\' 3"  (1.6 m)   Wt 193 lb (87.5 kg)   BMI 34.19 kg/m  Last Weight:  Wt Readings from Last 1 Encounters:  11/28/16 193 lb (87.5 kg)   Last Height:   Ht Readings from Last 1 Encounters:  11/28/16 5\' 3"  (1.6 m)    Physical exam: Exam: Gen: NAD, conversant, well nourised, obese, well groomed                     CV: RRR, no MRG. No Carotid Bruits. No peripheral edema, warm, nontender Eyes: Conjunctivae clear without exudates or hemorrhage  Neuro: Detailed Neurologic Exam  Speech:    Speech is normal; fluent and spontaneous with normal comprehension.  Cognition:    The patient is oriented to person, place, and time;     recent and remote memory intact;     language fluent;     normal attention, concentration,     fund of knowledge Cranial Nerves:    The pupils are equal, round, and reactive to light. . Visual fields are full to finger confrontation. Left ONH elevation? Extraocular movements are intact. Trigeminal sensation is intact and the muscles of mastication are normal. The face is symmetric. The palate elevates in the midline. Hearing intact. Voice is normal. Shoulder shrug is normal. The tongue has normal motion without fasciculations.   Coordination:    Normal finger to nose and heel to shin. Normal rapid alternating movements.   Gait:    Heel-toe and tandem gait are normal.   Motor Observation:    No asymmetry, no atrophy, and no involuntary movements noted. Tone:    Normal muscle tone.    Posture:    Posture is normal. normal erect    Strength:    Strength is V/V in the upper and lower limbs.      Sensation: intact to LT     Reflex Exam:  DTR's:    Deep tendon reflexes in the upper and lower extremities are normal bilaterally.   Toes:    The toes are downgoing bilaterally.   Clonus:    Clonus is absent.      Assessment/Plan:   46 year old patient with chronic migraines, polycythemia, acute vision loss left  eye(now 20/400) in the setting of worsening headaches/migraine and positional headaches as well as possible left optic nerve head elevation. Need MRI of the brain to evaluate for space-occupying masses, strokes (esp given polycythemia), and optic nerve lesions, intracranial pressue and other causes.  Dr. Katy Fitch for acute vision loss left eye, 20/400 left eye. Going today If vision worsens she is to proceed to the ED immediately  As far as your medications are concerned, I would like to suggest  At onset of headache: Take Sumatriptan (Imitrex) and prochloperazine(Compazine) at onset of headache. If it does not improve the symptoms please take one additional Sumatriptan. Do not take more then 2 Sumatriptan tablets in 24hrs.  Start Topirmate ER 25mg  at bedtime: 25mg  x 5=7 days, 50mg  x 5-7 days, 100mg . May need to increase further.  As far as diagnostic testing: MRI of the brain w/wo contrast, Ophthalmology Dr. Katy Fitch   There is increased risk for stroke in women with migraine with aura and a  Contraindication for the combined contraceptive pill for use by women who have migraine with aura, which is in line with World Health Organisation recommendations. The risk for women with migraine without aura is lower and other risk factors like smoking are far more likely to increase stroke risk than migraine. There is a recommendation for no smoking and for the use of low estrogen or progestogen only pills particularly for women with migraine with aura. It is important however that women with migraine who are taking the pill do not decide to suddenly stop taking it without discussing this with their doctor. Please discuss with her OB/GYN.  Patient would like to go back to estrogen hormone therapy. Being off of the estrogen hormone therapy is causing significant problems in her life especially with her husband due to her decreased libido. I did have a very long talk with patient regarding migraine with aura and  increased risk of stroke. There is an increased risk of stroke in patients with aura. There is a contraindication for HRT in migraine with aura however other risk factors like smoking are far more likely to increase stroke risk than migraine. There is a recommendation for no smoking and for the use of low estrogen or progestogen particularly for women with migraine with aura. People who have migraine headaches with auras may be 3 times more likely to have a stroke caused by a blood clot, compared to migraine patients who don't see auras. Women who take hormone-replacement therapy may be 30 percent more likely to suffer a clot-based stroke than women not taking medication containing estrogen. It's important to note, however, that recent research only found associations between migraines with aura, estrogen therapy and stroke risk. It did not prove cause-and-effect. Other risk factors like smoking and high blood pressure are much more important.   Patient understands the risks of Estrogen therapy and I feel as long as she is aware of the above, which she acknowledged and repeated back to me, she can restart HRT. If migraines or auras become more severe or frequent I would suggest stopping the HRT. Provided literature for patient to read at home as well.  Discussed: To prevent or relieve headaches, try the following: Cool Compress. Lie down and place a cool compress on your head.  Avoid headache triggers. If certain foods or odors seem to have triggered your migraines in the past, avoid them. A headache diary might help you identify triggers.  Include physical activity in your daily routine. Try a daily walk or other moderate aerobic exercise.  Manage stress. Find healthy ways to cope with the stressors, such as delegating tasks on your to-do list.  Practice relaxation techniques. Try deep breathing, yoga, massage and visualization.  Eat regularly. Eating regularly scheduled meals and maintaining a healthy  diet might help prevent headaches. Also, drink plenty of fluids.  Follow a regular sleep schedule. Sleep deprivation might contribute to headaches Consider biofeedback. With this mind-body technique, you learn to control certain bodily  functions - such as muscle tension, heart rate and blood pressure - to prevent headaches or reduce headache pain.    Proceed to emergency room if you experience new or worsening symptoms or symptoms do not resolve, if you have new neurologic symptoms or if headache is severe, or for any concerning symptom.   Provided education and documentation from American headache Society toolbox including articles on: chronic migraine medication overuse headache, chronic migraines, prevention of migraines, behavioral and other nonpharmacologic treatments for headache.   Orders Placed This Encounter  Procedures  . MR BRAIN W WO CONTRAST  . Ambulatory referral to Ophthalmology   Cc:Dr. Molli Knock, MD  [facility] [address] [place], [place] [address]  Phone [phone] Fax [phone]

## 2016-11-28 NOTE — Telephone Encounter (Signed)
Patient will see Dr. Katy Fitch today  11/28/2016 . At 9:00 am for vision left eye. 20/400.

## 2016-12-04 ENCOUNTER — Ambulatory Visit (INDEPENDENT_AMBULATORY_CARE_PROVIDER_SITE_OTHER): Payer: 59

## 2016-12-04 ENCOUNTER — Other Ambulatory Visit (HOSPITAL_BASED_OUTPATIENT_CLINIC_OR_DEPARTMENT_OTHER): Payer: 59

## 2016-12-04 ENCOUNTER — Ambulatory Visit: Payer: 59

## 2016-12-04 DIAGNOSIS — G43711 Chronic migraine without aura, intractable, with status migrainosus: Secondary | ICD-10-CM | POA: Diagnosis not present

## 2016-12-04 DIAGNOSIS — R51 Headache with orthostatic component, not elsewhere classified: Secondary | ICD-10-CM

## 2016-12-04 DIAGNOSIS — D45 Polycythemia vera: Secondary | ICD-10-CM

## 2016-12-04 DIAGNOSIS — H53132 Sudden visual loss, left eye: Secondary | ICD-10-CM | POA: Diagnosis not present

## 2016-12-04 LAB — CBC WITH DIFFERENTIAL/PLATELET
BASO%: 4.5 % — ABNORMAL HIGH (ref 0.0–2.0)
Basophils Absolute: 0.5 10*3/uL — ABNORMAL HIGH (ref 0.0–0.1)
EOS%: 5 % (ref 0.0–7.0)
Eosinophils Absolute: 0.5 10*3/uL (ref 0.0–0.5)
HCT: 42.6 % (ref 34.8–46.6)
HGB: 12.9 g/dL (ref 11.6–15.9)
LYMPH%: 18.3 % (ref 14.0–49.7)
MCH: 20 pg — ABNORMAL LOW (ref 25.1–34.0)
MCHC: 30.2 g/dL — ABNORMAL LOW (ref 31.5–36.0)
MCV: 66.3 fL — ABNORMAL LOW (ref 79.5–101.0)
MONO#: 0.4 10*3/uL (ref 0.1–0.9)
MONO%: 4.1 % (ref 0.0–14.0)
NEUT#: 7.2 10*3/uL — ABNORMAL HIGH (ref 1.5–6.5)
NEUT%: 68.1 % (ref 38.4–76.8)
Platelets: 794 10*3/uL — ABNORMAL HIGH (ref 145–400)
RBC: 6.43 10*6/uL — ABNORMAL HIGH (ref 3.70–5.45)
RDW: 22.2 % — ABNORMAL HIGH (ref 11.2–14.5)
WBC: 10.5 10*3/uL — ABNORMAL HIGH (ref 3.9–10.3)
lymph#: 1.9 10*3/uL (ref 0.9–3.3)

## 2016-12-04 MED ORDER — GADOPENTETATE DIMEGLUMINE 469.01 MG/ML IV SOLN: 18.0000 mL | Freq: Once | INTRAVENOUS | Status: AC | PRN

## 2016-12-04 NOTE — Progress Notes (Signed)
Per Dr. [name], patient does not need phlebotomy today with HCt of 42.6. Patient aware and verbalized understanding.

## 2016-12-09 ENCOUNTER — Telehealth: Payer: Self-pay

## 2016-12-09 NOTE — Telephone Encounter (Signed)
Received notes from opthalmology consult. Per Dr. Katy Fitch, "VF, OCT and dilated exam are all normal. Pt admits to sleeping in contacts, instructed her to stop, could lead to infection/ulcers. Had her put contacts in, holding a -1.00 loose lens over OS brings it up to a brisk 20/20, told her to go back to Dr. Marin Comment to work on strength on contact OS. Dr. Jaynee Eagles is following for migraines. Return for an appt as needed..." Sent to med records for scanning, copy to Dr. Jaynee Eagles for review.

## 2016-12-13 ENCOUNTER — Telehealth: Payer: Self-pay

## 2016-12-13 NOTE — Telephone Encounter (Signed)
-----   Message from Melvenia Beam, MD sent at 12/12/2016 10:54 AM EDT ----- MRi brain normal

## 2016-12-13 NOTE — Telephone Encounter (Signed)
Called pt w/ normal results. Verbalized understanding and appreciation for call.

## 2017-01-01 ENCOUNTER — Ambulatory Visit: Payer: 59

## 2017-01-01 ENCOUNTER — Other Ambulatory Visit (HOSPITAL_BASED_OUTPATIENT_CLINIC_OR_DEPARTMENT_OTHER): Payer: 59

## 2017-01-01 DIAGNOSIS — D45 Polycythemia vera: Secondary | ICD-10-CM

## 2017-01-01 LAB — CBC WITH DIFFERENTIAL/PLATELET
BASO%: 0.3 % (ref 0.0–2.0)
Basophils Absolute: 0 10*3/uL (ref 0.0–0.1)
EOS%: 5.7 % (ref 0.0–7.0)
Eosinophils Absolute: 0.7 10*3/uL — ABNORMAL HIGH (ref 0.0–0.5)
HCT: 42.8 % (ref 34.8–46.6)
HGB: 12.9 g/dL (ref 11.6–15.9)
LYMPH%: 18 % (ref 14.0–49.7)
MCH: 20 pg — ABNORMAL LOW (ref 25.1–34.0)
MCHC: 30.2 g/dL — ABNORMAL LOW (ref 31.5–36.0)
MCV: 66.2 fL — ABNORMAL LOW (ref 79.5–101.0)
MONO#: 0.5 10*3/uL (ref 0.1–0.9)
MONO%: 4.5 % (ref 0.0–14.0)
NEUT#: 8.7 10*3/uL — ABNORMAL HIGH (ref 1.5–6.5)
NEUT%: 71.5 % (ref 38.4–76.8)
Platelets: 924 10*3/uL — ABNORMAL HIGH (ref 145–400)
RBC: 6.46 10*6/uL — ABNORMAL HIGH (ref 3.70–5.45)
RDW: 21.7 % — ABNORMAL HIGH (ref 11.2–14.5)
WBC: 12.1 10*3/uL — ABNORMAL HIGH (ref 3.9–10.3)
lymph#: 2.2 10*3/uL (ref 0.9–3.3)

## 2017-01-01 NOTE — Progress Notes (Signed)
Per parameters on Dr. Hazeline Junker office note from her last visit, patient does not need phlebotomy today. She states that she feels well and has no complaints. Patient was given a copy of her lab results. Patient will call us with any problems that might arise until her next appointment.

## 2017-01-21 ENCOUNTER — Telehealth: Payer: Self-pay

## 2017-01-21 NOTE — Telephone Encounter (Signed)
Called and left a message with new apt with Roxanne Gates

## 2017-01-29 ENCOUNTER — Other Ambulatory Visit (HOSPITAL_BASED_OUTPATIENT_CLINIC_OR_DEPARTMENT_OTHER): Payer: 59

## 2017-01-29 ENCOUNTER — Ambulatory Visit (HOSPITAL_BASED_OUTPATIENT_CLINIC_OR_DEPARTMENT_OTHER): Payer: 59

## 2017-01-29 ENCOUNTER — Ambulatory Visit (HOSPITAL_BASED_OUTPATIENT_CLINIC_OR_DEPARTMENT_OTHER): Payer: 59 | Admitting: Oncology

## 2017-01-29 ENCOUNTER — Encounter: Payer: Self-pay | Admitting: Oncology

## 2017-01-29 VITALS — BP 111/73 | HR 73 | Resp 18

## 2017-01-29 DIAGNOSIS — D45 Polycythemia vera: Secondary | ICD-10-CM

## 2017-01-29 DIAGNOSIS — G43809 Other migraine, not intractable, without status migrainosus: Secondary | ICD-10-CM | POA: Diagnosis not present

## 2017-01-29 LAB — CBC WITH DIFFERENTIAL/PLATELET
BASO%: 0.3 % (ref 0.0–2.0)
Basophils Absolute: 0 10*3/uL (ref 0.0–0.1)
EOS%: 6 % (ref 0.0–7.0)
Eosinophils Absolute: 0.8 10*3/uL — ABNORMAL HIGH (ref 0.0–0.5)
HCT: 46.4 % (ref 34.8–46.6)
HGB: 13.9 g/dL (ref 11.6–15.9)
LYMPH%: 16.3 % (ref 14.0–49.7)
MCH: 19.7 pg — ABNORMAL LOW (ref 25.1–34.0)
MCHC: 30 g/dL — ABNORMAL LOW (ref 31.5–36.0)
MCV: 65.7 fL — ABNORMAL LOW (ref 79.5–101.0)
MONO#: 0.5 10*3/uL (ref 0.1–0.9)
MONO%: 4.3 % (ref 0.0–14.0)
NEUT#: 9.2 10*3/uL — ABNORMAL HIGH (ref 1.5–6.5)
NEUT%: 73.1 % (ref 38.4–76.8)
Platelets: 914 10*3/uL — ABNORMAL HIGH (ref 145–400)
RBC: 7.06 10*6/uL — ABNORMAL HIGH (ref 3.70–5.45)
RDW: 21.7 % — ABNORMAL HIGH (ref 11.2–14.5)
WBC: 12.6 10*3/uL — ABNORMAL HIGH (ref 3.9–10.3)
lymph#: 2.1 10*3/uL (ref 0.9–3.3)

## 2017-01-29 MED ORDER — TRAMADOL HCL 50 MG PO TABS: 50.0000 mg | ORAL_TABLET | Freq: Four times a day (QID) | ORAL | 0 refills | Status: DC | PRN

## 2017-01-29 MED ORDER — ASPIRIN EC 325 MG PO TBEC: 325.0000 mg | DELAYED_RELEASE_TABLET | Freq: Every day | ORAL | 3 refills | Status: DC

## 2017-01-29 NOTE — Progress Notes (Signed)
[facility]:    Leonard Downing, MD [place] [id number]   DIAGNOSIS: Cancer Staging No matching staging information was found for the patient. 46 year old with polycythemia vera diagnosed September 2017. Eileen [name] presented with a hemoglobin of 19, revealed weight within normal range, and positive JAK2 mutation.  SUMMARY OF ONCOLOGIC HISTORY:  No history exists.    CURRENT THERAPY: Aspirin 325 mg daily. Therapeutic phlebotomy every 2 weeks to keep her hematocrit less than 45. This was switched every 3 weeks starting in December 2017. Phlebotomy schedule was changed to every 4 weeks starting in February 2018.  INTERVAL HISTORY: Eileen [name] 46 y.o. female returns for routine follow-up by herself.The patient continues to have ongoing issues with migraine headaches and is now on Topamax and Imitrex. Eileen [name] uses Compazine for nausea. Eileen [name] is not reported any bleeding or thrombosis episodes. The patient has lost weight but is actively trying to do so. Eileen [name] notices more arthralgias and myalgias and past week or two and feels like Eileen [name] needs a phlebotomy today. The patient continues to work full-time and has no decline in her activities of daily living.   Patient Active Problem List   Diagnosis Date Noted  . 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 Polycythemia vera ([place]) 02/20/2016  . MORBID OBESITY 08/11/2007  . ANEMIA DUE TO CHRONIC BLOOD LOSS 08/11/2007  . ESOPHAGITIS 08/11/2007  . GERD 08/11/2007  . Headache 08/11/2007  . Personal history of peptic ulcer disease 08/11/2007    has No Known Allergies.  MEDICAL HISTORY: Past Medical History:  Diagnosis Date  . Acute duodenal ulcer without mention of hemorrhage, perforation, or obstruction   . Dysrhythmia    History of palpations, for many years  . GERD (gastroesophageal reflux disease)   . Headache    takes Advil  . Reflux esophagitis      SURGICAL HISTORY: Past Surgical History:  Procedure Laterality Date  . ABDOMINAL HYSTERECTOMY  2011ish  . CESAREAN SECTION     x 3   . ORIF ANKLE FRACTURE Left 12/21/2014   Procedure: OPEN REDUCTION INTERNAL FIXATION (ORIF) LEFT LATERAL MALLEOLUS ANKLE FRACTURE;  Surgeon: Leandrew Koyanagi, MD;  Location: [facility];  Service: Orthopedics;  Laterality: Left;  Needs RNFA  . UPPER GI ENDOSCOPY      SOCIAL HISTORY: Social History   Social History  . Marital status: Married    Spouse name: N/A  . Number of children: 3  . Years of education: N/A   Occupational History  . [employer]   Social History Main Topics  . Smoking status: Never Smoker  . Smokeless tobacco: Never Used  . Alcohol use No  . Drug use: No  . Sexual activity: Yes    Birth control/ protection: Surgical   Other Topics Concern  . Not on file   Social History Narrative   No caffeine     FAMILY HISTORY: Family History  Problem Relation Age of Onset  . Migraines Mother   . Breast cancer Unknown        Great MA   . Colon cancer Neg Hx     Review of Systems  Constitutional: Negative.   HENT:  Negative.   Eyes: Negative.   Respiratory: Negative.   Cardiovascular: Negative.   Gastrointestinal: Negative.   Endocrine: Negative.   Genitourinary: Negative.    Musculoskeletal: Positive for arthralgias and myalgias.  Skin: Negative.   Neurological: Negative.  Hematological: Negative.   Psychiatric/Behavioral: Negative.      PHYSICAL EXAMINATION  ECOG PERFORMANCE STATUS: 1 - Symptomatic but completely ambulatory  Vitals:   01/29/17 1453  BP: 115/70  Pulse: 72  Resp: 18  Temp: 98 F (36.7 C)  SpO2: 100%    Physical Exam  Constitutional: Eileen [name] is oriented to person, place, and time and well-developed, well-nourished, and in no distress. No distress.  HENT:  Head: Normocephalic.  Mouth/Throat: Oropharynx is clear and moist. No oropharyngeal exudate.  Eyes: Conjunctivae are  normal. No scleral icterus.  Neck: Normal range of motion. Neck supple.  Cardiovascular: Normal rate, regular rhythm, normal heart sounds and intact distal pulses.   Pulmonary/Chest: Effort normal and breath sounds normal. No respiratory distress. Eileen [name] has no wheezes. Eileen [name] has no rales.  Abdominal: Soft. Bowel sounds are normal. Eileen [name] exhibits no distension and no mass. There is no tenderness.  Musculoskeletal: Normal range of motion. Eileen [name] exhibits no edema.  Lymphadenopathy:    Eileen [name] has no cervical adenopathy.  Neurological: Eileen [name] is alert and oriented to person, place, and time. Gait normal.  Skin: Skin is warm and dry. No rash noted. Eileen [name] is not diaphoretic. No erythema. No pallor.  Psychiatric: Mood, memory, affect and judgment normal.  Vitals reviewed.   LABORATORY DATA:  CBC    Component Value Date/Time   WBC 12.6 (H) 01/29/2017 1439   WBC 16.6 (H) 11/17/2015 0448   RBC 7.06 (H) 01/29/2017 1439   RBC 7.04 (H) 11/17/2015 0448   HGB 13.9 01/29/2017 1439   HCT 46.4 01/29/2017 1439   PLT 914 (H) 01/29/2017 1439   MCV 65.7 (L) 01/29/2017 1439   MCH 19.7 (L) 01/29/2017 1439   MCH 28.0 11/17/2015 0448   MCHC 30.0 (L) 01/29/2017 1439   MCHC 33.1 11/17/2015 0448   RDW 21.7 (H) 01/29/2017 1439   LYMPHSABS 2.1 01/29/2017 1439   MONOABS 0.5 01/29/2017 1439   EOSABS 0.8 (H) 01/29/2017 1439   BASOSABS 0.0 01/29/2017 1439    CMP     Component Value Date/Time   NA 140 02/20/2016 1432   K 4.1 02/20/2016 1432   CL 105 11/17/2015 0448   CO2 25 02/20/2016 1432   GLUCOSE 154 (H) 02/20/2016 1432   BUN 11.1 02/20/2016 1432   CREATININE 1.0 02/20/2016 1432   CALCIUM 9.8 02/20/2016 1432   PROT 8.0 02/20/2016 1432   ALBUMIN 4.1 02/20/2016 1432   AST 13 02/20/2016 1432   ALT 13 02/20/2016 1432   ALKPHOS 115 02/20/2016 1432   BILITOT 0.71 02/20/2016 1432   GFRNONAA >60 11/17/2015 0448   GFRAA >60 11/17/2015 0448    RADIOGRAPHIC STUDIES:  No results found.  ASSESSMENT and THERAPY PLAN:    Polycythemia vera ([place]) This is a 46 year old female with polycythemia vera diagnosed in September 2017. Her hemoglobin was 19.9 and noted to have a positive JAK2 mutation. Eileen [name] also has an elevated platelet count and a mildly elevated white blood cell count which is stable.  Eileen [name] is currently receiving phlebotomy every 4 weeks to keep her hematocrit less than 45. The hematocrit today is 46.4 and Eileen [name] will proceed with phlebotomy as scheduled today. We will continue to check her labs monthly and proceed with phlebotomy if her hematocrit is above 45.  For thrombosis prophylaxis Eileen [name] will continue aspirin 325 mg daily indefinitely.  Follow-up will be in 4 weeks for labs and possible phlebotomy Eileen [name] will have a follow-up visit in 4 months.   Orders Placed This Encounter  Procedures  .  CBC with Differential/Platelet    Standing Status:   Standing    Number of Occurrences:   5    Standing Expiration Date:   01/29/2018    All questions were answered. The patient knows to call the clinic with any problems, questions or concerns. We can certainly see the patient much sooner if necessary.  Mikey Bussing, NP 01/29/2017

## 2017-01-29 NOTE — Patient Instructions (Signed)

## 2017-01-29 NOTE — Assessment & Plan Note (Signed)
This is a 46 year old female with polycythemia vera diagnosed in September 2017. Her hemoglobin was 19.9 and noted to have a positive JAK2 mutation. She also has an elevated platelet count and a mildly elevated white blood cell count which is stable.  She is currently receiving phlebotomy every 4 weeks to keep her hematocrit less than 45. The hematocrit today is 46.4 and she will proceed with phlebotomy as scheduled today. We will continue to check her labs monthly and proceed with phlebotomy if her hematocrit is above 45.  For thrombosis prophylaxis she will continue aspirin 325 mg daily indefinitely.  Follow-up will be in 4 weeks for labs and possible phlebotomy She will have a follow-up visit in 4 months.

## 2017-02-26 ENCOUNTER — Ambulatory Visit (HOSPITAL_BASED_OUTPATIENT_CLINIC_OR_DEPARTMENT_OTHER): Payer: 59

## 2017-02-26 ENCOUNTER — Other Ambulatory Visit (HOSPITAL_BASED_OUTPATIENT_CLINIC_OR_DEPARTMENT_OTHER): Payer: 59

## 2017-02-26 DIAGNOSIS — D45 Polycythemia vera: Secondary | ICD-10-CM | POA: Diagnosis not present

## 2017-02-26 LAB — CBC WITH DIFFERENTIAL/PLATELET
BASO%: 4.7 % — ABNORMAL HIGH (ref 0.0–2.0)
Basophils Absolute: 0.5 10*3/uL — ABNORMAL HIGH (ref 0.0–0.1)
EOS%: 6 % (ref 0.0–7.0)
Eosinophils Absolute: 0.7 10*3/uL — ABNORMAL HIGH (ref 0.0–0.5)
HCT: 44.1 % (ref 34.8–46.6)
HGB: 12.9 g/dL (ref 11.6–15.9)
LYMPH%: 18 % (ref 14.0–49.7)
MCH: 19.7 pg — ABNORMAL LOW (ref 25.1–34.0)
MCHC: 29.3 g/dL — ABNORMAL LOW (ref 31.5–36.0)
MCV: 67.2 fL — ABNORMAL LOW (ref 79.5–101.0)
MONO#: 0.4 10*3/uL (ref 0.1–0.9)
MONO%: 3.6 % (ref 0.0–14.0)
NEUT#: 7.8 10*3/uL — ABNORMAL HIGH (ref 1.5–6.5)
NEUT%: 67.7 % (ref 38.4–76.8)
Platelets: 733 10*3/uL — ABNORMAL HIGH (ref 145–400)
RBC: 6.56 10*6/uL — ABNORMAL HIGH (ref 3.70–5.45)
RDW: 21.1 % — ABNORMAL HIGH (ref 11.2–14.5)
WBC: 11.6 10*3/uL — ABNORMAL HIGH (ref 3.9–10.3)
lymph#: 2.1 10*3/uL (ref 0.9–3.3)
nRBC: 0 % (ref 0–0)

## 2017-02-26 NOTE — Progress Notes (Signed)
Patient presents to clinic for the therapeutic phlebotomy. MD goal is to keep HCT less than 45%. HCT of 44.1. Lab values printed and patient assessed in lobby. States that she has had " some body aches and headaches". Patient prefers to proceed with phlebotomy since she is symptomatic. MD notified.   530 grams phlebotomized from 1509 - 1515 with 16g to R AC. Patient tolerated procedure well. Snack and drink offered and accepted. 33minute observation complete. Patient stable at time of discharge. Vitals signs stable.  Wylene Simmer, BSN, RN 02/26/2017 3:38 PM

## 2017-03-03 ENCOUNTER — Ambulatory Visit: Payer: 59 | Admitting: Neurology

## 2017-03-04 ENCOUNTER — Encounter: Payer: Self-pay | Admitting: Neurology

## 2017-03-18 DIAGNOSIS — Z0289 Encounter for other administrative examinations: Secondary | ICD-10-CM

## 2017-03-26 ENCOUNTER — Ambulatory Visit: Payer: 59

## 2017-03-26 ENCOUNTER — Other Ambulatory Visit (HOSPITAL_BASED_OUTPATIENT_CLINIC_OR_DEPARTMENT_OTHER): Payer: 59

## 2017-03-26 DIAGNOSIS — D45 Polycythemia vera: Secondary | ICD-10-CM | POA: Diagnosis not present

## 2017-03-26 LAB — CBC WITH DIFFERENTIAL/PLATELET
BASO%: 3.7 % — ABNORMAL HIGH (ref 0.0–2.0)
Basophils Absolute: 0.4 10*3/uL — ABNORMAL HIGH (ref 0.0–0.1)
EOS%: 5.3 % (ref 0.0–7.0)
Eosinophils Absolute: 0.6 10*3/uL — ABNORMAL HIGH (ref 0.0–0.5)
HCT: 43.3 % (ref 34.8–46.6)
HGB: 12.3 g/dL (ref 11.6–15.9)
LYMPH%: 17.7 % (ref 14.0–49.7)
MCH: 19.2 pg — ABNORMAL LOW (ref 25.1–34.0)
MCHC: 28.4 g/dL — ABNORMAL LOW (ref 31.5–36.0)
MCV: 67.6 fL — ABNORMAL LOW (ref 79.5–101.0)
MONO#: 0.4 10*3/uL (ref 0.1–0.9)
MONO%: 3.5 % (ref 0.0–14.0)
NEUT#: 8 10*3/uL — ABNORMAL HIGH (ref 1.5–6.5)
NEUT%: 69.8 % (ref 38.4–76.8)
Platelets: 902 10*3/uL — ABNORMAL HIGH (ref 145–400)
RBC: 6.41 10*6/uL — ABNORMAL HIGH (ref 3.70–5.45)
RDW: 20.8 % — ABNORMAL HIGH (ref 11.2–14.5)
WBC: 11.4 10*3/uL — ABNORMAL HIGH (ref 3.9–10.3)
lymph#: 2 10*3/uL (ref 0.9–3.3)
nRBC: 0 % (ref 0–0)

## 2017-03-26 LAB — TECHNOLOGIST REVIEW

## 2017-03-26 NOTE — Progress Notes (Signed)
Pt presented today for scheduled phlebotomy. Goal is for HCT to be under <45%. Today her level was 43.3%. She stated she was not having body aches like before but has been having a lot of heart palpitations. I spoke with Mikey Bussing regarding pt and she recommended based on labs and patients symptoms to cancel phlebotomy for today. She recommended patient to see her PCP regarding her palpitations as this would not be related to her current treatment.  Cyndia Bent RN

## 2017-04-23 ENCOUNTER — Other Ambulatory Visit (HOSPITAL_BASED_OUTPATIENT_CLINIC_OR_DEPARTMENT_OTHER): Payer: 59

## 2017-04-23 ENCOUNTER — Telehealth: Payer: Self-pay

## 2017-04-23 DIAGNOSIS — D45 Polycythemia vera: Secondary | ICD-10-CM | POA: Diagnosis not present

## 2017-04-23 LAB — CBC WITH DIFFERENTIAL/PLATELET
HCT: 44.7 % (ref 34.8–46.6)
HGB: 12.5 g/dL (ref 11.6–15.9)
MCH: 18.8 pg — ABNORMAL LOW (ref 25.1–34.0)
MCHC: 28 g/dL — ABNORMAL LOW (ref 31.5–36.0)
MCV: 67.1 fL — ABNORMAL LOW (ref 79.5–101.0)
Platelets: 888 10*3/uL — ABNORMAL HIGH (ref 145–400)
RBC: 6.66 10*6/uL — ABNORMAL HIGH (ref 3.70–5.45)
RDW: 21 % — ABNORMAL HIGH (ref 11.2–14.5)
WBC: 12.9 10*3/uL — ABNORMAL HIGH (ref 3.9–10.3)

## 2017-04-23 LAB — MANUAL DIFFERENTIAL
ALC: 2.1 10*3/uL (ref 0.9–3.3)
ANC (CHCC manual diff): 7.3 10*3/uL — ABNORMAL HIGH (ref 1.5–6.5)
Band Neutrophils: 0 % (ref 0–10)
Basophil: 13 % — ABNORMAL HIGH (ref 0–2)
Blasts: 0 % (ref 0–0)
EOS: 9 % — ABNORMAL HIGH (ref 0–7)
LYMPH: 16 % (ref 14–49)
MONO: 5 % (ref 0–14)
Metamyelocytes: 0 % (ref 0–0)
Myelocytes: 0 % (ref 0–0)
Other Cell: 0 % (ref 0–0)
PLT EST: INCREASED
PROMYELO: 0 % (ref 0–0)
SEG: 57 % (ref 38–77)
Variant Lymph: 0 % (ref 0–0)
nRBC: 0 % (ref 0–0)

## 2017-04-23 NOTE — Telephone Encounter (Signed)
Patient aware of lab results.

## 2017-04-30 ENCOUNTER — Ambulatory Visit (INDEPENDENT_AMBULATORY_CARE_PROVIDER_SITE_OTHER): Payer: 59 | Admitting: Neurology

## 2017-04-30 ENCOUNTER — Encounter: Payer: Self-pay | Admitting: Neurology

## 2017-04-30 VITALS — BP 111/74 | HR 74 | Ht 63.0 in | Wt 176.4 lb

## 2017-04-30 DIAGNOSIS — G43109 Migraine with aura, not intractable, without status migrainosus: Secondary | ICD-10-CM | POA: Diagnosis not present

## 2017-04-30 MED ORDER — DICLOFENAC POTASSIUM(MIGRAINE) 50 MG PO PACK: 50.0000 mg | PACK | Freq: Once | ORAL | 11 refills | Status: DC | PRN

## 2017-04-30 MED ORDER — TOPIRAMATE ER 150 MG PO SPRINKLE CAP24: 150.0000 mg | EXTENDED_RELEASE_CAPSULE | Freq: Every day | ORAL | 11 refills | Status: DC

## 2017-04-30 MED ORDER — RIZATRIPTAN BENZOATE 10 MG PO TABS: 10.0000 mg | ORAL_TABLET | ORAL | 11 refills | Status: DC | PRN

## 2017-04-30 NOTE — Progress Notes (Signed)
[facility]    Provider:  Dr Jaynee Eagles Referring Provider: Leonard Downing, * Primary Care Physician:  Leonard Downing, MD  CC:  Migraines  Addendum from Ophthalmology Dr. Katy Fitch: appt 11/28/2016: VF, OCT and dilated exam all normal. puttig in contacts brings her to 64/20 vision.   Interval history 04/30/2017: She is improved, she has 10 headache days a month, 5 are migrainous. She started Qudexy and back on estrogen and feeling much better. Migraines still send her to bed. 0  HPI:  Eileen [name] is a 46 y.o. female here as a referral from Dr. Arelia Sneddon for Migraines. PMHx polycythemia vera. She gets phlebotomy every 3 months.  She has had headaches for years since teenager. They start on the right side of the head in the temple behind the eyes, pouding and throbbing like someone jamming her eye, severe pain, nausea and vomiting, severe light sensitivity, perfumes makes it worse, hamburger can trigger. Dark room helps. Hypersensitive to sound. Phonophobia. She has 25 headache days a month, 10 a month are migrainous and the others are more tension in the back of the neck. Not usual with an aura, light flashes or wavy occasionally. Migraines can last up to 24 hours untreated. Storms trigger migraines.  She is having vision changes, acute vision loss.  Vision loss when laying down worse. Headaches worse when she bends over. No weight gain, she has lost about 50 pounds. No other focal neurologic deficits, associated symptoms, inciting events or modifiable factors.  Reviewed notes, labs and imaging from outside physicians, which showed:  Medications tried: Propranolol, topiramate, diclofenac pills, cambia (on now), ibuprofen, alleve, tylenol     Review of Systems: Patient complains of symptoms per HPI as well as the following symptoms: headache. Pertinent negatives and positives per HPI. All others negative.   Social History   Socioeconomic History  . Marital  status: Married    Spouse name: Not on file  . Number of children: 3  . Years of education: Not on file  . Highest education level: Not on file  Social Needs  . Financial resource strain: Not on file  . Food insecurity - worry: Not on file  . Food insecurity - inability: Not on file  . Transportation needs - medical: Not on file  . Transportation needs - non-medical: Not on file  Occupational History  . Occupation: Agricultural engineer: [employer]  Tobacco Use  . Smoking status: Never Smoker  . Smokeless tobacco: Never Used  Substance and Sexual Activity  . Alcohol use: No  . Drug use: No  . Sexual activity: Yes    Birth control/protection: Surgical  Other Topics Concern  . Not on file  Social History Narrative   No caffeine    Right handed   Lives at home with her husband and daughter    Family History  Problem Relation Age of Onset  . Migraines Mother   . Breast cancer Unknown        Great MA   . Colon cancer Neg Hx     Past Medical History:  Diagnosis Date  . Acute duodenal ulcer without mention of hemorrhage, perforation, or obstruction   . Dysrhythmia    History of palpations, for many years  . GERD (gastroesophageal reflux disease)   . Headache    takes Advil  . Reflux esophagitis     Past Surgical History:  Procedure Laterality Date  . ABDOMINAL HYSTERECTOMY  2011ish  . CESAREAN  SECTION     x 3   . ORIF ANKLE FRACTURE Left 12/21/2014   Procedure: OPEN REDUCTION INTERNAL FIXATION (ORIF) LEFT LATERAL MALLEOLUS ANKLE FRACTURE;  Surgeon: Leandrew Koyanagi, MD;  Location: [place];  Service: Orthopedics;  Laterality: Left;  Needs RNFA  . UPPER GI ENDOSCOPY      Current Outpatient Medications  Medication Sig Dispense Refill  . aspirin EC 325 MG tablet Take 1 tablet (325 mg total) by mouth daily. 30 tablet 3  . butalbital-aspirin-caffeine (FIORINAL) 50-325-40 MG capsule Take 1 capsule by mouth 2 (two) times daily as needed for headache.    .  prochlorperazine (COMPAZINE) 5 MG tablet Take 1 tablet (5 mg total) by mouth every 8 (eight) hours as needed for nausea. 30 tablet 3  . PROPRANOLOL HCL PO Take by mouth.    . traMADol (ULTRAM) 50 MG tablet Take 1 tablet (50 mg total) by mouth every 6 (six) hours as needed for moderate pain. 30 tablet 0  . Diclofenac Potassium 50 MG PACK Take 50 mg by mouth once as needed. For migraine. 9 each 11  . rizatriptan (MAXALT) 10 MG tablet Take 1 tablet (10 mg total) by mouth as needed for migraine. May repeat in 2 hours if needed 10 tablet 11  . Topiramate ER (QUDEXY XR) 150 MG CS24 sprinkle capsule Take 150 mg by mouth at bedtime. 30 each 11   No current facility-administered medications for this visit.    Facility-Administered Medications Ordered in Other Visits  Medication Dose Route Frequency Provider Last Rate Last Dose  . gadopentetate dimeglumine (MAGNEVIST) injection 18 mL  18 mL Intravenous Once PRN Melvenia Beam, MD        Allergies as of 04/30/2017  . (No Known Allergies)    Vitals: BP 111/74 (BP Location: Right Arm, Patient Position: Sitting)   Pulse 74   Ht 5\' 3"  (1.6 m)   Wt 176 lb 6.4 oz (80 kg)   BMI 31.25 kg/m  Last Weight:  Wt Readings from Last 1 Encounters:  04/30/17 176 lb 6.4 oz (80 kg)   Last Height:   Ht Readings from Last 1 Encounters:  04/30/17 5\' 3"  (1.6 m)   Physical exam: Exam: Gen: NAD, conversant, well nourised, obese, well groomed                     CV: RRR, no MRG. No Carotid Bruits. No peripheral edema, warm, nontender Eyes: Conjunctivae clear without exudates or hemorrhage  Neuro: Detailed Neurologic Exam  Speech:    Speech is normal; fluent and spontaneous with normal comprehension.  Cognition:    The patient is oriented to person, place, and time;     recent and remote memory intact;     language fluent;     normal attention, concentration,     fund of knowledge Cranial Nerves:    The pupils are equal, round, and reactive to light.  The fundi are normal and spontaneous venous pulsations are present. Visual fields are full to finger confrontation. Extraocular movements are intact. Trigeminal sensation is intact and the muscles of mastication are normal. The face is symmetric. The palate elevates in the midline. Hearing intact. Voice is normal. Shoulder shrug is normal. The tongue has normal motion without fasciculations.   Coordination:    Normal finger to nose and heel to shin. Normal rapid alternating movements.   Gait:    Heel-toe and tandem gait are normal.   Motor Observation:  No asymmetry, no atrophy, and no involuntary movements noted. Tone:    Normal muscle tone.    Posture:    Posture is normal. normal erect    Strength:    Strength is V/V in the upper and lower limbs.      Sensation: intact to LT     Reflex Exam:  DTR's:    Deep tendon reflexes in the upper and lower extremities are normal bilaterally.   Toes:    The toes are downgoing bilaterally.   Clonus:    Clonus is absent.      Assessment/Plan:   46 year old patient with chronic migraines, polycythemia, acute vision loss left eye(now 20/400) in the setting of worsening headaches/migraine and positional headaches as well as possible left optic nerve head elevation. Need MRI of the brain to evaluate for space-occupying masses, strokes (esp given polycythemia), and optic nerve lesions, intracranial pressue and other causes.  Dr. Katy Fitch for acute vision loss left eye, 20/400 left eye. Going today If vision worsens she is to proceed to the ED immediately MRI brain normal   As far as your medications are concerned, I would like to suggest  At onset of headache: Take Rizatriptan(maxalt) and Cambia, may also take prochloperazine(Compazine) at onset of headache. If it does not improve the symptoms please take one additional Maxalt. Do not take more then 2 Maxalttablets in 24hrs.  Increase Topirmate ER 150mg  at bedtime:  As far as  diagnostic testing: MRI of the brain w/wo contrast, Ophthalmology Dr. Katy Fitch: Both normal   There is increased risk for stroke in women with migraine with aura and a  Contraindication for the combined contraceptive pill for use by women who have migraine with aura, which is in line with World Health Organisation recommendations. The risk for women with migraine without aura is lower and other risk factors like smoking are far more likely to increase stroke risk than migraine. There is a recommendation for no smoking and for the use of low estrogen or progestogen only pills particularly for women with migraine with aura. It is important however that women with migraine who are taking the pill do not decide to suddenly stop taking it without discussing this with their doctor. Please discuss with her OB/GYN.  Patient would like to go back to estrogen hormone therapy. Being off of the estrogen hormone therapy is causing significant problems in her life especially with her husband due to her decreased libido. I did have a very long talk with patient regarding migraine with aura and increased risk of stroke. There is an increased risk of stroke in patients with aura. There is a contraindication for HRT in migraine with aura however other risk factors like smoking are far more likely to increase stroke risk than migraine. There is a recommendation for no smoking and for the use of low estrogen or progestogen particularly for women with migraine with aura. People who have migraine headaches with auras may be 3 times more likely to have a stroke caused by a blood clot, compared to migraine patients who don't see auras. Women who take hormone-replacement therapy may be 30 percent more likely to suffer a clot-based stroke than women not taking medication containing estrogen. It's important to note, however, that recent research only found associations between migraines with aura, estrogen therapy and stroke risk. It did  not prove cause-and-effect. Other risk factors like smoking and high blood pressure are much more important.   Patient understands the risks of Estrogen  therapy and I feel as long as she is aware of the above, which she acknowledged and repeated back to me, she can restart HRT. If migraines or auras become more severe or frequent I would suggest stopping the HRT. Provided literature for patient to read at home as well.    Sarina Ill, MD  [facility] [address] [place],  [address]  Phone [phone] Fax [phone]  A total of 25 minutes was spent face-to-face with this patient. Over half this time was spent on counseling patient on the migraine diagnosis and different diagnostic and therapeutic options available.

## 2017-04-30 NOTE — Patient Instructions (Signed)
At onset of headache: Take Rizatriptan(maxalt) and Cambia, may also take prochloperazine(Compazine) at onset of headache. If it does not improve the symptoms please take one additional Maxalt. Do not take more then 2 Maxalttablets in 24hrs.  Increase Topirmate ER 150mg  at bedtime:  Rizatriptan tablets What is this medicine? RIZATRIPTAN (rye za TRIP tan) is used to treat migraines with or without aura. An aura is a strange feeling or visual disturbance that warns you of an attack. It is not used to prevent migraines. This medicine may be used for other purposes; ask your health care provider or pharmacist if you have questions. COMMON BRAND NAME(S): Maxalt What should I tell my health care provider before I take this medicine? They need to know if you have any of these conditions: -bowel disease or colitis -diabetes -family history of heart disease -fast or irregular heart beat -heart or blood vessel disease, angina (chest pain), or previous heart attack -high blood pressure -high cholesterol -history of stroke, transient ischemic attacks (TIAs or mini-strokes), or intracranial bleeding -kidney or liver disease -overweight -poor circulation -postmenopausal or surgical removal of uterus and ovaries -Raynaud's disease -seizure disorder -an unusual or allergic reaction to rizatriptan, other medicines, foods, dyes, or preservatives -pregnant or trying to get pregnant -breast-feeding How should I use this medicine? This medicine is taken by mouth with a glass of water. Follow the directions on the prescription label. This medicine is taken at the first symptoms of a migraine. It is not for everyday use. If your migraine headache returns after one dose, you can take another dose as directed. You must leave at least 2 hours between doses, and do not take more than 30 mg total in 24 hours. If there is no improvement at all after the first dose, do not take a second dose without talking to your  doctor or health care professional. Do not take your medicine more often than directed. Talk to your pediatrician regarding the use of this medicine in children. While this drug may be prescribed for children as young as 6 years for selected conditions, precautions do apply. Overdosage: If you think you have taken too much of this medicine contact a poison control center or emergency room at once. NOTE: This medicine is only for you. Do not share this medicine with others. What if I miss a dose? This does not apply; this medicine is not for regular use. What may interact with this medicine? Do not take this medicine with any of the following medicines: -amphetamine, dextroamphetamine or cocaine -dihydroergotamine, ergotamine, ergoloid mesylates, methysergide, or ergot-type medication - do not take within 24 hours of taking rizatriptan -feverfew -MAOIs like Carbex, Eldepryl, Marplan, Nardil, and Parnate - do not take rizatriptan within 2 weeks of stopping MAOI therapy. -other migraine medicines like almotriptan, eletriptan, naratriptan, sumatriptan, zolmitriptan - do not take within 24 hours of taking rizatriptan -tryptophan This medicine may also interact with the following medications: -medicines for mental depression, anxiety or mood problems -propranolol This list may not describe all possible interactions. Give your health care provider a list of all the medicines, herbs, non-prescription drugs, or dietary supplements you use. Also tell them if you smoke, drink alcohol, or use illegal drugs. Some items may interact with your medicine. What should I watch for while using this medicine? Only take this medicine for a migraine headache. Take it if you get warning symptoms or at the start of a migraine attack. It is not for regular use to prevent migraine attacks.  You may get drowsy or dizzy. Do not drive, use machinery, or do anything that needs mental alertness until you know how this medicine  affects you. To reduce dizzy or fainting spells, do not sit or stand up quickly, especially if you are an older patient. Alcohol can increase drowsiness, dizziness and flushing. Avoid alcoholic drinks. Smoking cigarettes may increase the risk of heart-related side effects from using this medicine. If you take migraine medicines for 10 or more days a month, your migraines may get worse. Keep a diary of headache days and medicine use. Contact your healthcare professional if your migraine attacks occur more frequently. What side effects may I notice from receiving this medicine? Side effects that you should report to your doctor or health care professional as soon as possible: -allergic reactions like skin rash, itching or hives, swelling of the face, lips, or tongue -fast, slow, or irregular heart beat -increased or decreased blood pressure -seizures -severe stomach pain and cramping, bloody diarrhea -signs and symptoms of a blood clot such as breathing problems; changes in vision; chest pain; severe, sudden headache; pain, swelling, warmth in the leg; trouble speaking; sudden numbness or weakness of the face, arm or leg -tingling, pain, or numbness in the face, hands, or feet Side effects that usually do not require medical attention (report to your doctor or health care professional if they continue or are bothersome): -drowsiness -dry mouth -feeling warm, flushing, or redness of the face -headache -muscle cramps, pain -nausea, vomiting -unusually weak or tired This list may not describe all possible side effects. Call your doctor for medical advice about side effects. You may report side effects to FDA at 1-800-FDA-1088. Where should I keep my medicine? Keep out of the reach of children. Store at room temperature between 15 and 30 degrees C (59 and 86 degrees F). Keep container tightly closed. Throw away any unused medicine after the expiration date. NOTE: This sheet is a summary. It may not  cover all possible information. If you have questions about this medicine, talk to your doctor, pharmacist, or health care provider.  2018 Elsevier/Gold Standard (2013-01-26 10:16:39)    Diclofenac powder for oral solution What is this medicine? DICLOFENAC (dye KLOE fen ak) is a non-steroidal anti-inflammatory drug (NSAID). It is used to treat migraine pain. This medicine may be used for other purposes; ask your health care provider or pharmacist if you have questions. COMMON BRAND NAME(S): Cambia What should I tell my health care provider before I take this medicine? They need to know if you have any of these conditions: -asthma, especially aspirin sensitive asthma -coronary artery bypass graft (CABG) surgery within the past 2 weeks -drink more than 3 alcohol-containing drinks a day -heart disease or circulation problems like heart failure or leg edema (fluid retention) -high blood pressure -kidney disease -liver disease -phenylketonuria -stomach problems -an unusual or allergic reaction to diclofenac, aspirin, other NSAIDs, other medicines, foods, dyes, or preservatives -pregnant or trying to get pregnant -breast-feeding How should I use this medicine? Mix this medicine with 1 to 2 ounces of water. Drink the medicine and water together. Follow the directions on the prescription label. Do not take your medicine more often than directed. Long-term, continuous use may increase the risk of heart attack or stroke. A special MedGuide will be given to you by the pharmacist with each prescription and refill. Be sure to read this information carefully each time. Talk to your pediatrician regarding the use of this medicine in children. Special care  may be needed. Elderly patients over 32 years old may have a stronger reaction and need a smaller dose. Overdosage: If you think you have taken too much of this medicine contact a poison control center or emergency room at once. NOTE: This medicine  is only for you. Do not share this medicine with others. What if I miss a dose? This does not apply. What may interact with this medicine? Do not take this medicine with any of the following medications: -cidofovir -ketorolac -methotrexate This medicine may also interact with the following medications: -alcohol -aspirin and aspirin-like medicines -cyclosporine -diuretics -lithium -medicines for blood pressure -medicines for osteoporosis -medicines that affect platelets -medicines that treat or prevent blood clots like warfarin -NSAIDs, medicines for pain and inflammation, like ibuprofen or naproxen -pemetrexed -steroid medicines like prednisone or cortisone This list may not describe all possible interactions. Give your health care provider a list of all the medicines, herbs, non-prescription drugs, or dietary supplements you use. Also tell them if you smoke, drink alcohol, or use illegal drugs. Some items may interact with your medicine. What should I watch for while using this medicine? Tell your doctor or health care professional if your pain does not get better. Talk to your doctor before taking another medicine for pain. Do not treat yourself. This medicine does not prevent heart attack or stroke. In fact, this medicine may increase the chance of a heart attack or stroke. The chance may increase with longer use of this medicine and in people who have heart disease. If you take aspirin to prevent heart attack or stroke, talk with your doctor or health care professional. Do not take medicines such as ibuprofen and naproxen with this medicine. Side effects such as stomach upset, nausea, or ulcers may be more likely to occur. Many medicines available without a prescription should not be taken with this medicine. This medicine can cause ulcers and bleeding in the stomach and intestines at any time during treatment. Do not smoke cigarettes or drink alcohol. These increase irritation to your  stomach and can make it more susceptible to damage from this medicine. Ulcers and bleeding can happen without warning symptoms and can cause death. You may get drowsy or dizzy. Do not drive, use machinery, or do anything that needs mental alertness until you know how this medicine affects you. Do not stand or sit up quickly, especially if you are an older patient. This reduces the risk of dizzy or fainting spells. This medicine can cause you to bleed more easily. Try to avoid damage to your teeth and gums when you brush or floss your teeth. If you take migraine medicines for 10 or more days a month, your migraines may get worse. Keep a diary of headache days and medicine use. Contact your healthcare professional if your migraine attacks occur more frequently. What side effects may I notice from receiving this medicine? Side effects that you should report to your doctor or health care professional as soon as possible: -allergic reactions like skin rash, itching or hives, swelling of the face, lips, or tongue -black or bloody stools, blood in the urine or vomit -blurred vision -chest pain -difficulty breathing or wheezing -nausea or vomiting -fever -redness, blistering, peeling or loosening of the skin, including inside the mouth -slurred speech or weakness on one side of the body -trouble passing urine or change in the amount of urine -unexplained weight gain or swelling -unusually weak or tired -yellowing of eyes or skin Side effects that usually  do not require medical attention (report to your doctor or health care professional if they continue or are bothersome): -constipation -diarrhea -dizziness -headache -heartburn This list may not describe all possible side effects. Call your doctor for medical advice about side effects. You may report side effects to FDA at 1-800-FDA-1088. Where should I keep my medicine? Keep out of the reach of children. Store at room temperature between 15 and  30 degrees C (59 and 86 degrees F). Throw away any unused medicine after the expiration date. NOTE: This sheet is a summary. It may not cover all possible information. If you have questions about this medicine, talk to your doctor, pharmacist, or health care provider.  2018 Elsevier/Gold Standard (2015-06-29 09:56:49)

## 2017-05-21 ENCOUNTER — Ambulatory Visit (HOSPITAL_BASED_OUTPATIENT_CLINIC_OR_DEPARTMENT_OTHER): Payer: 59

## 2017-05-21 ENCOUNTER — Other Ambulatory Visit (HOSPITAL_BASED_OUTPATIENT_CLINIC_OR_DEPARTMENT_OTHER): Payer: 59

## 2017-05-21 ENCOUNTER — Ambulatory Visit (HOSPITAL_BASED_OUTPATIENT_CLINIC_OR_DEPARTMENT_OTHER): Payer: 59 | Admitting: Oncology

## 2017-05-21 VITALS — BP 113/74 | HR 76 | Resp 18

## 2017-05-21 VITALS — BP 118/87 | HR 79 | Temp 97.9°F | Resp 18 | Ht 63.0 in | Wt 177.6 lb

## 2017-05-21 DIAGNOSIS — D45 Polycythemia vera: Secondary | ICD-10-CM

## 2017-05-21 DIAGNOSIS — G43809 Other migraine, not intractable, without status migrainosus: Secondary | ICD-10-CM

## 2017-05-21 LAB — CBC WITH DIFFERENTIAL/PLATELET
BASO%: 4 % — ABNORMAL HIGH (ref 0.0–2.0)
Basophils Absolute: 0.5 10*3/uL — ABNORMAL HIGH (ref 0.0–0.1)
EOS%: 7.1 % — ABNORMAL HIGH (ref 0.0–7.0)
Eosinophils Absolute: 0.9 10*3/uL — ABNORMAL HIGH (ref 0.0–0.5)
HCT: 46.2 % (ref 34.8–46.6)
HGB: 13.4 g/dL (ref 11.6–15.9)
LYMPH%: 16.2 % (ref 14.0–49.7)
MCH: 19.1 pg — ABNORMAL LOW (ref 25.1–34.0)
MCHC: 29 g/dL — ABNORMAL LOW (ref 31.5–36.0)
MCV: 65.7 fL — ABNORMAL LOW (ref 79.5–101.0)
MONO#: 0.6 10*3/uL (ref 0.1–0.9)
MONO%: 4.3 % (ref 0.0–14.0)
NEUT#: 9.1 10*3/uL — ABNORMAL HIGH (ref 1.5–6.5)
NEUT%: 68.4 % (ref 38.4–76.8)
Platelets: 849 10*3/uL — ABNORMAL HIGH (ref 145–400)
RBC: 7.03 10*6/uL — ABNORMAL HIGH (ref 3.70–5.45)
RDW: 21.5 % — ABNORMAL HIGH (ref 11.2–14.5)
WBC: 13.3 10*3/uL — ABNORMAL HIGH (ref 3.9–10.3)
lymph#: 2.2 10*3/uL (ref 0.9–3.3)
nRBC: 0 % (ref 0–0)

## 2017-05-21 NOTE — Patient Instructions (Signed)

## 2017-05-21 NOTE — Progress Notes (Signed)
Therapeutic phlebotomy indicated with Hct of 46.2. 500 grams obtained with 16g to R AC. Patient tolerated procedure well. Drink provided. Patient and vital signs stable upon discharge.   Wylene Simmer, BSN, RN 05/21/2017 4:41 PM

## 2017-05-21 NOTE — Progress Notes (Signed)
Hematology and Oncology Follow Up Visit  Eileen [name] [id number] 30-Mar-1971 46 y.o. 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 She presented with hemoglobin of 19, erythropoietin within normal range and positive JAK2 mutation.  Current therapy: Aspirin 325 mg daily. Therapeutic phlebotomy every 2 weeks to keep her hematocrit less than 45 started in September 2017.  Phlebotomy schedule changed to every 4 weeks starting in February 2018.  Interim History: Eileen [name] presents today for a follow-up visit. Since the last visit, she reports no major changes in her health.  Her migraine headaches have been manageable and continues to follow with neurology regarding this issue.  She continues to tolerate phlebotomy without any major complications.  She denies any lightheadedness or excessive fatigue.  She denies any syncope or falls.  She denies any thrombosis or bleeding episodes.  She continues to take aspirin without any complaints.  She denies any change in her appetite or constitutional symptoms.  She denies any early satiety.  She does not report any blurry vision, syncope or seizures. She denied any fevers, chills, sweats or joint swelling. She does report myalgias as described. She does not report any chest pain, orthopnea or leg edema. She does not report any cough, wheezing or hemoptysis. She does not report any nausea, vomiting, abdominal pain, hematochezia or melena. She does not report any frequency urgency or hesitancy. She does not report any pathological fractures. She does not report any lymphadenopathy or petechiae. Remaining review of systems unremarkable.   Medications: I have reviewed the patient's current medications.  Current Outpatient Medications  Medication Sig Dispense Refill  . aspirin EC 325 MG tablet Take 1 tablet (325 mg total) by mouth daily. 30 tablet 3   . butalbital-aspirin-caffeine (FIORINAL) 50-325-40 MG capsule Take 1 capsule by mouth 2 (two) times daily as needed for headache.    . Diclofenac Potassium 50 MG PACK Take 50 mg by mouth once as needed. For migraine. 9 each 11  . prochlorperazine (COMPAZINE) 5 MG tablet Take 1 tablet (5 mg total) by mouth every 8 (eight) hours as needed for nausea. 30 tablet 3  . PROPRANOLOL HCL PO Take by mouth.    . rizatriptan (MAXALT) 10 MG tablet Take 1 tablet (10 mg total) by mouth as needed for migraine. May repeat in 2 hours if needed 10 tablet 11  . Topiramate ER (QUDEXY XR) 150 MG CS24 sprinkle capsule Take 150 mg by mouth at bedtime. 30 each 11  . traMADol (ULTRAM) 50 MG tablet Take 1 tablet (50 mg total) by mouth every 6 (six) hours as needed for moderate pain. 30 tablet 0   No current facility-administered medications for this visit.    Facility-Administered Medications Ordered in Other Visits  Medication Dose Route Frequency Provider Last Rate Last Dose  . gadopentetate dimeglumine (MAGNEVIST) injection 18 mL  18 mL Intravenous Once PRN Melvenia Beam, MD         Allergies: No Known Allergies  Past Medical History, Surgical history, Social history, and Family History were reviewed and updated.  Physical Exam: Blood pressure 118/87, pulse 79, temperature 97.9 F (36.6 C), temperature source Oral, resp. rate 18, height 5\' 3"  (1.6 m), weight 177 lb 9.6 oz (80.6 kg), SpO2 100 %. ECOG: 0 General appearance: Well-appearing woman without distress. Head: Normocephalic, without obvious abnormality no oral ulcers or thrush. Neck: no adenopathy without neck masses. Lymph nodes: Cervical, supraclavicular, and  axillary nodes normal. Heart:regular rate and rhythm, S1, S2 normal, no murmur, click, rub or gallop Lung:chest clear, no wheezing, rales, normal symmetric air entry Abdomin: soft, non-tender, without masses or organomegaly no shifting dullness or ascites. EXT:no erythema, induration, or  nodules Logical exam: No deficits.  Lab Results: Lab Results  Component Value Date   WBC 13.3 (H) 05/21/2017   HGB 13.4 05/21/2017   HCT 46.2 05/21/2017   MCV 65.7 (L) 05/21/2017   PLT 849 (H) 05/21/2017     Chemistry      Component Value Date/Time   NA 140 02/20/2016 1432   K 4.1 02/20/2016 1432   CL 105 11/17/2015 0448   CO2 25 02/20/2016 1432   BUN 11.1 02/20/2016 1432   CREATININE 1.0 02/20/2016 1432      Component Value Date/Time   CALCIUM 9.8 02/20/2016 1432   ALKPHOS 115 02/20/2016 1432   AST 13 02/20/2016 1432   ALT 13 02/20/2016 1432   BILITOT 0.71 02/20/2016 1432      Impression and Plan:  46 year old woman with:  1. Polycythemia vera documented in September 2017. Her hemoglobin was 19.9 and noted to have positive JAK2 mutation. She also has an elevated platelet count and normal white cell count.  She is currently receiving phlebotomy every 4 weeks to keep her hematocrit less than 45.   She will receive phlebotomy today and will continue on the every month schedule for the time being.  She reports feeling better after phlebotomy.  She reports no complications related to the schedule.  2. Thrombosis prophylaxis: She will continue on aspirin at this time indefinitely.  3. Cytoreductive therapy: The role of hydroxyurea was in the setting of her thrombocytosis. Her thrombosis risk is low at this time given her young age in no previous history of thrombosis. We will defer hydroxyurea at least for the time being.  We will continue to address this issue in future visits.  4. Persistent headaches: Appears to have improved at this time with migraine regimen.  Continues to follow with neurology regarding her migraine headaches.  5. Follow-up: Will be in 4 weeks to continue phlebotomy. She will have clinical visit in 3 months.    Zola Button, MD 12/12/20183:51 PM as

## 2017-06-18 ENCOUNTER — Inpatient Hospital Stay: Payer: 59

## 2017-06-18 ENCOUNTER — Inpatient Hospital Stay: Payer: 59 | Attending: Oncology

## 2017-06-18 DIAGNOSIS — D45 Polycythemia vera: Secondary | ICD-10-CM | POA: Diagnosis not present

## 2017-06-18 LAB — CBC WITH DIFFERENTIAL/PLATELET
Basophils Absolute: 0.4 10*3/uL — ABNORMAL HIGH (ref 0.0–0.1)
Basophils Relative: 4 %
Eosinophils Absolute: 0.7 10*3/uL — ABNORMAL HIGH (ref 0.0–0.5)
Eosinophils Relative: 7 %
HCT: 40.2 % (ref 34.8–46.6)
Hemoglobin: 11.4 g/dL — ABNORMAL LOW (ref 11.6–15.9)
Lymphocytes Relative: 19 %
Lymphs Abs: 2.1 10*3/uL (ref 0.9–3.3)
MCH: 18.6 pg — ABNORMAL LOW (ref 25.1–34.0)
MCHC: 28.4 g/dL — ABNORMAL LOW (ref 31.5–36.0)
MCV: 65.6 fL — ABNORMAL LOW (ref 79.5–101.0)
Monocytes Absolute: 0.3 10*3/uL (ref 0.1–0.9)
Monocytes Relative: 3 %
Neutro Abs: 7.5 10*3/uL — ABNORMAL HIGH (ref 1.5–6.5)
Neutrophils Relative %: 67 %
Platelets: 749 10*3/uL — ABNORMAL HIGH (ref 145–400)
RBC: 6.13 MIL/uL — ABNORMAL HIGH (ref 3.70–5.45)
RDW: 21.1 % — ABNORMAL HIGH (ref 11.2–16.1)
WBC: 11 10*3/uL — ABNORMAL HIGH (ref 3.9–10.3)

## 2017-06-18 NOTE — Progress Notes (Signed)
Hct 40.2, phlebotomy not needed. Give copy of lab results.

## 2017-07-16 ENCOUNTER — Inpatient Hospital Stay: Payer: 59 | Attending: Oncology

## 2017-07-16 ENCOUNTER — Inpatient Hospital Stay: Payer: 59

## 2017-07-16 DIAGNOSIS — D45 Polycythemia vera: Secondary | ICD-10-CM | POA: Diagnosis present

## 2017-07-16 LAB — CBC WITH DIFFERENTIAL/PLATELET
Basophils Absolute: 0.5 10*3/uL — ABNORMAL HIGH (ref 0.0–0.1)
Basophils Relative: 4 %
Eosinophils Absolute: 0.7 10*3/uL — ABNORMAL HIGH (ref 0.0–0.5)
Eosinophils Relative: 7 %
HCT: 42.8 % (ref 34.8–46.6)
Hemoglobin: 12.3 g/dL (ref 11.6–15.9)
Lymphocytes Relative: 20 %
Lymphs Abs: 2.1 10*3/uL (ref 0.9–3.3)
MCH: 18.6 pg — ABNORMAL LOW (ref 25.1–34.0)
MCHC: 28.7 g/dL — ABNORMAL LOW (ref 31.5–36.0)
MCV: 64.8 fL — ABNORMAL LOW (ref 79.5–101.0)
Monocytes Absolute: 0.5 10*3/uL (ref 0.1–0.9)
Monocytes Relative: 4 %
Neutro Abs: 6.8 10*3/uL — ABNORMAL HIGH (ref 1.5–6.5)
Neutrophils Relative %: 65 %
Platelets: 791 10*3/uL — ABNORMAL HIGH (ref 145–400)
RBC: 6.61 MIL/uL — ABNORMAL HIGH (ref 3.70–5.45)
RDW: 21.2 % — ABNORMAL HIGH (ref 11.2–14.5)
WBC: 10.6 10*3/uL — ABNORMAL HIGH (ref 3.9–10.3)

## 2017-07-16 NOTE — Patient Instructions (Signed)
Therapeutic Phlebotomy, Care After Refer to this sheet in the next few weeks. These instructions provide you with information about caring for yourself after your procedure. Your health care provider may also give you more specific instructions. Your treatment has been planned according to current medical practices, but problems sometimes occur. Call your health care provider if you have any problems or questions after your procedure. What can I expect after the procedure? After the procedure, it is common to have:  Light-headedness or dizziness. You may feel faint.  Nausea.  Tiredness.  Follow these instructions at home: Activity  Return to your normal activities as directed by your health care provider. Most people can go back to their normal activities right away.  Avoid strenuous physical activity and heavy lifting or pulling for about 5 hours after the procedure. Do not lift anything that is heavier than 10 lb (4.5 kg).  Athletes should avoid strenuous exercise for at least 12 hours.  Change positions slowly for the remainder of the day. This will help to prevent light-headedness or fainting.  If you feel light-headed, lie down until the feeling goes away. Eating and drinking  Be sure to eat well-balanced meals for the next 24 hours.  Drink enough fluid to keep your urine clear or pale yellow.  Avoid drinking alcohol on the day that you had the procedure. Care of the Needle Insertion Site  Keep your bandage dry. You can remove the bandage after about 5 hours or as directed by your health care provider.  If you have bleeding from the needle insertion site, elevate your arm and press firmly on the site until the bleeding stops.  If you have bruising at the site, apply ice to the area: ? Put ice in a plastic bag. ? Place a towel between your skin and the bag. ? Leave the ice on for 20 minutes, 2-3 times a day for the first 24 hours.  If the swelling does not go away after  24 hours, apply a warm, moist washcloth to the area for 20 minutes, 2-3 times a day. General instructions  Avoid smoking for at least 30 minutes after the procedure.  Keep all follow-up visits as directed by your health care provider. It is important to continue with further therapeutic phlebotomy treatments as directed. Contact a health care provider if:  You have redness, swelling, or pain at the needle insertion site.  You have fluid, blood, or pus coming from the needle insertion site.  You feel light-headed, dizzy, or nauseated, and the feeling does not go away.  You notice new bruising at the needle insertion site.  You feel weaker than normal.  You have a fever or chills. Get help right away if:  You have severe nausea or vomiting.  You have chest pain.  You have trouble breathing. This information is not intended to replace advice given to you by your health care provider. Make sure you discuss any questions you have with your health care provider. Document Released: 10/29/2010 Document Revised: 01/27/2016 Document Reviewed: 05/23/2014 Elsevier Interactive Patient Education  2018 [employer]. Therapeutic Phlebotomy Discharge Instructions  - Increase your fluid intake over the next 4 hours  - No smoking for 30 minutes  - Avoid using the affected arm (the one you had the blood drawn from) for heavy lifting or other activities.  - You may resume all normal activities after 30 minutes.  You are to notify the office if you experience:   - Persistent dizziness  and/or lightheadedness -Uncontrolled or excessive bleeding at the site.

## 2017-07-16 NOTE — Progress Notes (Signed)
Patient's Hct results 42.8%, per MD notes phlebotomy if Hct is over 45%.  Patient aware and voiced understanding.  Patient c/o dizziness with position changes off and on X 2 weeks.  Dr. [name] aware, informed to follow up with PCP regarding medications.  Patient instructed to change positions slowly d/t dizziness.

## 2017-08-12 ENCOUNTER — Inpatient Hospital Stay: Payer: 59

## 2017-08-12 ENCOUNTER — Telehealth: Payer: Self-pay

## 2017-08-12 ENCOUNTER — Inpatient Hospital Stay: Payer: 59 | Attending: Oncology | Admitting: Oncology

## 2017-08-12 VITALS — BP 112/77 | HR 73 | Temp 98.0°F | Resp 18 | Ht 63.0 in | Wt 180.1 lb

## 2017-08-12 DIAGNOSIS — D45 Polycythemia vera: Secondary | ICD-10-CM | POA: Diagnosis present

## 2017-08-12 DIAGNOSIS — R718 Other abnormality of red blood cells: Secondary | ICD-10-CM

## 2017-08-12 DIAGNOSIS — D509 Iron deficiency anemia, unspecified: Secondary | ICD-10-CM

## 2017-08-12 DIAGNOSIS — G43809 Other migraine, not intractable, without status migrainosus: Secondary | ICD-10-CM

## 2017-08-12 DIAGNOSIS — G43909 Migraine, unspecified, not intractable, without status migrainosus: Secondary | ICD-10-CM | POA: Insufficient documentation

## 2017-08-12 LAB — CBC WITH DIFFERENTIAL/PLATELET
Basophils Absolute: 0.5 10*3/uL — ABNORMAL HIGH (ref 0.0–0.1)
Basophils Relative: 5 %
Eosinophils Absolute: 1 10*3/uL — ABNORMAL HIGH (ref 0.0–0.5)
Eosinophils Relative: 9 %
HCT: 40.3 % (ref 34.8–46.6)
Hemoglobin: 11.8 g/dL (ref 11.6–15.9)
Lymphocytes Relative: 20 %
Lymphs Abs: 2.3 10*3/uL (ref 0.9–3.3)
MCH: 18.3 pg — ABNORMAL LOW (ref 25.1–34.0)
MCHC: 29.3 g/dL — ABNORMAL LOW (ref 31.5–36.0)
MCV: 62.3 fL — ABNORMAL LOW (ref 79.5–101.0)
Monocytes Absolute: 0.4 10*3/uL (ref 0.1–0.9)
Monocytes Relative: 4 %
Neutro Abs: 7 10*3/uL — ABNORMAL HIGH (ref 1.5–6.5)
Neutrophils Relative %: 62 %
Platelets: 802 10*3/uL — ABNORMAL HIGH (ref 145–400)
RBC: 6.48 MIL/uL — ABNORMAL HIGH (ref 3.70–5.45)
RDW: 22.6 % — ABNORMAL HIGH (ref 11.2–14.5)
WBC: 11.3 10*3/uL — ABNORMAL HIGH (ref 3.9–10.3)

## 2017-08-12 NOTE — Progress Notes (Signed)
Hematology and Oncology Follow Up Visit  Eileen [name] [id number] 1971/05/16 47 y.o. 08/12/2017 2:52 PM Leonard Downing, MDElkins, Curt Jews, *   Principle Diagnosis: 47 year old with polycythemia vera presented with hemoglobin of 19 low erythropoietin and positive JAK2 mutation. This was diagnosed in September 2017.  Current therapy: Aspirin 325 mg daily.  Therapeutic phlebotomy every 4 weeks to keep her hematocrit less than 45.    Interim History: Eileen [name] is here for a follow-up. She reports no major changes in her health since the last visit. She continues to receive intermittent phlebotomy to keep her hematocrit less than 45. She has not received to phlebotomy on a monthly basis given her reasonable hemoglobin control and less phlebotomy was in December 2018.  She does not report any arthralgias or myalgias. She is not report any changes in her weight or activity level. Her migraine headaches are under better control with any recent exacerbations.   She does not report any blurry vision, syncope or seizures. She denied any fevers, chills, sweats or joint swelling.  She does not report any chest pain, orthopnea or leg edema. She does not report any cough, wheezing or hemoptysis. She does not report any nausea, vomiting, abdominal pain, hematochezia or melena. She does not report any frequency urgency or hesitancy. She does not report any pathological fractures. She does not report any lymphadenopathy or petechiae. She does not report any skin rashes or lesions. Remaining review of systems is negative.  Medications: I have reviewed the patient's current medications.  Current Outpatient Medications  Medication Sig Dispense Refill  . aspirin EC 325 MG tablet Take 1 tablet (325 mg total) by mouth daily. 30 tablet 3  . butalbital-aspirin-caffeine (FIORINAL) 50-325-40 MG capsule Take 1 capsule by mouth 2 (two) times daily as needed for headache.    . Diclofenac Potassium 50 MG  PACK Take 50 mg by mouth once as needed. For migraine. 9 each 11  . prochlorperazine (COMPAZINE) 5 MG tablet Take 1 tablet (5 mg total) by mouth every 8 (eight) hours as needed for nausea. 30 tablet 3  . PROPRANOLOL HCL PO Take by mouth.    . rizatriptan (MAXALT) 10 MG tablet Take 1 tablet (10 mg total) by mouth as needed for migraine. May repeat in 2 hours if needed 10 tablet 11  . Topiramate ER (QUDEXY XR) 150 MG CS24 sprinkle capsule Take 150 mg by mouth at bedtime. 30 each 11  . traMADol (ULTRAM) 50 MG tablet Take 1 tablet (50 mg total) by mouth every 6 (six) hours as needed for moderate pain. 30 tablet 0   No current facility-administered medications for this visit.    Facility-Administered Medications Ordered in Other Visits  Medication Dose Route Frequency Provider Last Rate Last Dose  . gadopentetate dimeglumine (MAGNEVIST) injection 18 mL  18 mL Intravenous Once PRN Melvenia Beam, MD         Allergies: No Known Allergies  Past Medical History, Surgical history, Social history, and Family History personally reviewed and updated.  Physical Exam: Blood pressure 112/77, pulse 73, temperature 98 F (36.7 C), temperature source Oral, resp. rate 18, height 5\' 3"  (1.6 m), weight 180 lb 1.6 oz (81.7 kg), SpO2 100 %.   ECOG: 0 General appearance: Alert, awake woman without distress. Head: Atraumatic without any abnormalities. Oropharynx: No oral thrush or ulcers. Eyes: No scleral icterus. Lymph nodes: Cervical, supraclavicular, and axillary nodes normal. Heart:regular rate and rhythm, S1, S2 normal, no murmur, click, rub or gallop  Lung: clear to auscultation in all fields.  No wheezing, rales, normal symmetric air entry Abdomin: Soft, nontender without any rebound or guarding. Good bowel sounds. Musculoskeletal: No joint deformity or effusion. Neurological: No motor, sensory deficits.   Lab Results: Lab Results  Component Value Date   WBC 10.6 (H) 07/16/2017   HGB 12.3  07/16/2017   HCT 42.8 07/16/2017   MCV 64.8 (L) 07/16/2017   PLT 791 (H) 07/16/2017     Chemistry      Component Value Date/Time   NA 140 02/20/2016 1432   K 4.1 02/20/2016 1432   CL 105 11/17/2015 0448   CO2 25 02/20/2016 1432   BUN 11.1 02/20/2016 1432   CREATININE 1.0 02/20/2016 1432      Component Value Date/Time   CALCIUM 9.8 02/20/2016 1432   ALKPHOS 115 02/20/2016 1432   AST 13 02/20/2016 1432   ALT 13 02/20/2016 1432   BILITOT 0.71 02/20/2016 1432      Impression and Plan:  47 year old woman with:  1. 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 She has documented JAK 2 mutation an elevated hemoglobin.  She has been receiving intermittent phlebotomy to keep hematocrit less than 45 and last phlebotomy was in December 2018. Her hemoglobin is 11.8 with a hematocrit of 40.3. She will not receive a phlebotomy at this time.   2. Thrombosis prophylaxis: She has a low risk of thrombosis and continues to be on 81 mg daily.  3. Cytoreductive therapy: Risks and benefits of cytoreduction therapy with hydroxyurea was reviewed again. Long-term, patient satiety with this medication was discussed. Given her young age and reasonable control of her counts, we elected to defer this option to a later date. Her pelvic count still elevated but controlled for the time being.  4. Migraine headaches: Reasonably controlled without any recent exacerbations.  5. Microcytosis: Iron deficiency could be responsible for her microcytosis as a consequence of phlebotomy. He would be reasonable to treat her iron deficiency in the future if she develops any specific symptoms or worsening anemia.  6. Follow-up: Will be in 4 weeks to continue phlebotomy. She will have clinical visit in 6 months.  15  minutes was spent with the patient face-to-face today.  More than 50% of time was dedicated to patient counseling, education and coordination of her care.    Eileen Button, MD 3/5/20192:52 PM

## 2017-08-12 NOTE — Telephone Encounter (Signed)
Printed avs and calender of upcoming appointment.  Per 3/5 los 

## 2017-09-09 ENCOUNTER — Other Ambulatory Visit: Payer: 59

## 2017-09-10 ENCOUNTER — Other Ambulatory Visit: Payer: 59

## 2017-09-30 NOTE — Telephone Encounter (Signed)
error openiong

## 2017-10-07 ENCOUNTER — Other Ambulatory Visit: Payer: 59

## 2017-10-08 ENCOUNTER — Other Ambulatory Visit: Payer: Self-pay | Admitting: *Deleted

## 2017-10-08 ENCOUNTER — Inpatient Hospital Stay: Payer: 59 | Attending: Oncology

## 2017-10-08 ENCOUNTER — Inpatient Hospital Stay: Payer: 59

## 2017-10-08 DIAGNOSIS — D45 Polycythemia vera: Secondary | ICD-10-CM

## 2017-10-08 DIAGNOSIS — D509 Iron deficiency anemia, unspecified: Secondary | ICD-10-CM

## 2017-10-08 LAB — CBC WITH DIFFERENTIAL (CANCER CENTER ONLY)
Basophils Absolute: 0.4 10*3/uL — ABNORMAL HIGH (ref 0.0–0.1)
Basophils Relative: 3 %
Eosinophils Absolute: 0.6 10*3/uL — ABNORMAL HIGH (ref 0.0–0.5)
Eosinophils Relative: 6 %
HCT: 42.5 % (ref 34.8–46.6)
Hemoglobin: 12.2 g/dL (ref 11.6–15.9)
Lymphocytes Relative: 24 %
Lymphs Abs: 2.5 10*3/uL (ref 0.9–3.3)
MCH: 18.7 pg — ABNORMAL LOW (ref 25.1–34.0)
MCHC: 28.7 g/dL — ABNORMAL LOW (ref 31.5–36.0)
MCV: 65.1 fL — ABNORMAL LOW (ref 79.5–101.0)
Monocytes Absolute: 0.4 10*3/uL (ref 0.1–0.9)
Monocytes Relative: 3 %
Neutro Abs: 6.8 10*3/uL — ABNORMAL HIGH (ref 1.5–6.5)
Neutrophils Relative %: 64 %
Platelet Count: 666 10*3/uL — ABNORMAL HIGH (ref 145–400)
RBC: 6.53 MIL/uL — ABNORMAL HIGH (ref 3.70–5.45)
RDW: 21.8 % — ABNORMAL HIGH (ref 11.2–14.5)
WBC Count: 10.6 10*3/uL — ABNORMAL HIGH (ref 3.9–10.3)

## 2017-10-08 NOTE — Progress Notes (Signed)
No phlebotomy today due to Hct 42.5 which is within goal range.  Patient stated she felt well and received a copy of her labs.

## 2017-10-09 LAB — IRON AND TIBC
Iron: 15 ug/dL — ABNORMAL LOW (ref 41–142)
Saturation Ratios: 5 % — ABNORMAL LOW (ref 21–57)
TIBC: 301 ug/dL (ref 236–444)
UIBC: 286 ug/dL

## 2017-10-09 LAB — FERRITIN: Ferritin: 4 ng/mL — ABNORMAL LOW (ref 9–269)

## 2017-11-04 ENCOUNTER — Inpatient Hospital Stay: Payer: 59

## 2017-11-12 ENCOUNTER — Other Ambulatory Visit: Payer: 59

## 2017-11-15 ENCOUNTER — Telehealth: Payer: Self-pay | Admitting: Oncology

## 2017-11-15 NOTE — Telephone Encounter (Signed)
Called patient with new Appointment date/time for 8/13 due to template change

## 2017-12-01 ENCOUNTER — Other Ambulatory Visit: Payer: Self-pay | Admitting: *Deleted

## 2017-12-01 DIAGNOSIS — D45 Polycythemia vera: Secondary | ICD-10-CM

## 2017-12-02 ENCOUNTER — Inpatient Hospital Stay: Payer: 59

## 2017-12-02 ENCOUNTER — Inpatient Hospital Stay: Payer: 59 | Attending: Oncology

## 2017-12-30 ENCOUNTER — Inpatient Hospital Stay: Payer: 59

## 2017-12-30 ENCOUNTER — Inpatient Hospital Stay: Payer: 59 | Attending: Oncology

## 2017-12-30 DIAGNOSIS — D45 Polycythemia vera: Secondary | ICD-10-CM

## 2017-12-30 LAB — CBC WITH DIFFERENTIAL (CANCER CENTER ONLY)
Basophils Absolute: 0.4 10*3/uL — ABNORMAL HIGH (ref 0.0–0.1)
Basophils Relative: 4 %
Eosinophils Absolute: 0.6 10*3/uL — ABNORMAL HIGH (ref 0.0–0.5)
Eosinophils Relative: 6 %
HCT: 43.5 % (ref 34.8–46.6)
Hemoglobin: 12.4 g/dL (ref 11.6–15.9)
Lymphocytes Relative: 20 %
Lymphs Abs: 2 10*3/uL (ref 0.9–3.3)
MCH: 18.4 pg — ABNORMAL LOW (ref 25.1–34.0)
MCHC: 28.5 g/dL — ABNORMAL LOW (ref 31.5–36.0)
MCV: 64.5 fL — ABNORMAL LOW (ref 79.5–101.0)
Monocytes Absolute: 0.5 10*3/uL (ref 0.1–0.9)
Monocytes Relative: 5 %
Neutro Abs: 6.6 10*3/uL — ABNORMAL HIGH (ref 1.5–6.5)
Neutrophils Relative %: 65 %
Platelet Count: 667 10*3/uL — ABNORMAL HIGH (ref 145–400)
RBC: 6.74 MIL/uL — ABNORMAL HIGH (ref 3.70–5.45)
RDW: 21.9 % — ABNORMAL HIGH (ref 11.2–14.5)
WBC Count: 10 10*3/uL (ref 3.9–10.3)

## 2017-12-30 NOTE — Progress Notes (Signed)
Pt's Hct 43.5 and she stated she felt well. No phlebotomy performed. Copy of labs given to pt

## 2018-01-19 ENCOUNTER — Other Ambulatory Visit: Payer: Self-pay | Admitting: *Deleted

## 2018-01-19 DIAGNOSIS — D45 Polycythemia vera: Secondary | ICD-10-CM

## 2018-01-19 DIAGNOSIS — D500 Iron deficiency anemia secondary to blood loss (chronic): Secondary | ICD-10-CM

## 2018-01-20 ENCOUNTER — Ambulatory Visit: Payer: 59 | Admitting: Oncology

## 2018-01-20 ENCOUNTER — Other Ambulatory Visit: Payer: 59

## 2018-01-20 ENCOUNTER — Telehealth: Payer: Self-pay

## 2018-01-20 ENCOUNTER — Inpatient Hospital Stay (HOSPITAL_BASED_OUTPATIENT_CLINIC_OR_DEPARTMENT_OTHER): Payer: 59 | Admitting: Oncology

## 2018-01-20 ENCOUNTER — Inpatient Hospital Stay: Payer: 59

## 2018-01-20 ENCOUNTER — Inpatient Hospital Stay: Payer: 59 | Attending: Oncology

## 2018-01-20 VITALS — BP 111/71 | HR 80 | Temp 98.3°F | Resp 17 | Ht 63.0 in | Wt 184.7 lb

## 2018-01-20 DIAGNOSIS — G43909 Migraine, unspecified, not intractable, without status migrainosus: Secondary | ICD-10-CM | POA: Insufficient documentation

## 2018-01-20 DIAGNOSIS — D45 Polycythemia vera: Secondary | ICD-10-CM | POA: Insufficient documentation

## 2018-01-20 DIAGNOSIS — Z7982 Long term (current) use of aspirin: Secondary | ICD-10-CM | POA: Insufficient documentation

## 2018-01-20 DIAGNOSIS — D500 Iron deficiency anemia secondary to blood loss (chronic): Secondary | ICD-10-CM

## 2018-01-20 LAB — CBC WITH DIFFERENTIAL (CANCER CENTER ONLY)
Basophils Absolute: 0.5 10*3/uL — ABNORMAL HIGH (ref 0.0–0.1)
Basophils Relative: 5 %
Eosinophils Absolute: 0.5 10*3/uL (ref 0.0–0.5)
Eosinophils Relative: 5 %
HCT: 42.5 % (ref 34.8–46.6)
Hemoglobin: 12.6 g/dL (ref 11.6–15.9)
Lymphocytes Relative: 19 %
Lymphs Abs: 1.8 10*3/uL (ref 0.9–3.3)
MCH: 18.3 pg — ABNORMAL LOW (ref 25.1–34.0)
MCHC: 29.6 g/dL — ABNORMAL LOW (ref 31.5–36.0)
MCV: 61.9 fL — ABNORMAL LOW (ref 79.5–101.0)
Monocytes Absolute: 0.4 10*3/uL (ref 0.1–0.9)
Monocytes Relative: 4 %
Neutro Abs: 6.6 10*3/uL — ABNORMAL HIGH (ref 1.5–6.5)
Neutrophils Relative %: 67 %
Platelet Count: 684 10*3/uL — ABNORMAL HIGH (ref 145–400)
RBC: 6.86 MIL/uL — ABNORMAL HIGH (ref 3.70–5.45)
RDW: 23.4 % — ABNORMAL HIGH (ref 11.2–14.5)
WBC Count: 9.8 10*3/uL (ref 3.9–10.3)

## 2018-01-20 NOTE — Progress Notes (Signed)
Hematology and Oncology Follow Up Visit  Eileen [name] [id number] July 02, 1970 47 y.o. 01/20/2018 1:12 PM Eileen [name], MDElkins, Eileen [name], *   Principle Diagnosis: 48 year old with polycythemia vera diagnosed in 2017 after presenting with elevated hemoglobin and Jak 2+ mutation.    Current therapy: Aspirin 325 mg daily.  Therapeutic phlebotomy every 4 weeks to keep her hematocrit less than 45.    Interim History: Eileen [name] presents today for a follow-up.  Since her last visit, she reports no recent complaints or changes.  She has not received phlebotomy for the last few months and her performance status and activity level remain excellent.  She has not reported any worsening headaches or any constitutional symptoms.  She does report some mild myalgias but for the most part continues to be asymptomatic.  She denies any recent thrombosis or bleeding.  Her migraine headaches have been under control.  She does not report any blurry vision, syncope or seizures.  She denies any alteration mental status or dizziness.  She denied any fevers, chills, sweats. She does not report any chest pain, orthopnea or leg edema. She does not report any cough, wheezing or hemoptysis. She does not report any nausea, vomiting, abdominal pain, hematochezia or melena. She does not report any frequency urgency or hesitancy. She does not report any arthralgias. She does not report any lymphadenopathy or petechiae. She does not report any skin rash or ecchymosis.  He does not report any bleeding or clotting tendency. Remaining review of systems is negative.  Medications: I have reviewed the patient's current medications.  Current Outpatient Medications  Medication Sig Dispense Refill  . aspirin EC 325 MG tablet Take 1 tablet (325 mg total) by mouth daily. 30 tablet 3  . butalbital-aspirin-caffeine (FIORINAL) 50-325-40 MG capsule Take 1 capsule by mouth 2 (two) times daily as needed for headache.    .  Diclofenac Potassium 50 MG PACK Take 50 mg by mouth once as needed. For migraine. 9 each 11  . prochlorperazine (COMPAZINE) 5 MG tablet Take 1 tablet (5 mg total) by mouth every 8 (eight) hours as needed for nausea. 30 tablet 3  . PROPRANOLOL HCL PO Take by mouth.    . rizatriptan (MAXALT) 10 MG tablet Take 1 tablet (10 mg total) by mouth as needed for migraine. May repeat in 2 hours if needed 10 tablet 11  . Topiramate ER (QUDEXY XR) 150 MG CS24 sprinkle capsule Take 150 mg by mouth at bedtime. 30 each 11  . traMADol (ULTRAM) 50 MG tablet Take 1 tablet (50 mg total) by mouth every 6 (six) hours as needed for moderate pain. 30 tablet 0   No current facility-administered medications for this visit.    Facility-Administered Medications Ordered in Other Visits  Medication Dose Route Frequency Provider Last Rate Last Dose  . gadopentetate dimeglumine (MAGNEVIST) injection 18 mL  18 mL Intravenous Once PRN Melvenia Beam, MD         Allergies: No Known Allergies  Past Medical History, Surgical history, Social history, and Family History personally reviewed and updated.  Physical Exam:  Blood pressure 111/71, pulse 80, temperature 98.3 F (36.8 C), temperature source Oral, resp. rate 17, height 5\' 3"  (1.6 m), weight 184 lb 11.2 oz (83.8 kg), SpO2 100 %.   ECOG: 0 General appearance: Well-appearing woman without distress. Head: Atraumatic without abnormalities. Oropharynx: Her any oral thrush or ulcers. Eyes: Sclera anicteric. Lymph nodes: No lymphadenopathy noted in the cervical, supraclavicular, or axillary nodes  Heart:  Regular rate without any murmurs or gallops. Lung: Clear without any wheezes or dullness to percussion. Abdomin: Soft, nondistended good bowel sounds. Musculoskeletal: No clubbing or cyanosis. Neurological: No deficits noted motor, sensory or deep tendon reflexes.  Lab Results: Lab Results  Component Value Date   WBC 10.0 12/30/2017   HGB 12.4 12/30/2017   HCT  43.5 12/30/2017   MCV 64.5 (L) 12/30/2017   PLT 667 (H) 12/30/2017     Chemistry      Component Value Date/Time   NA 140 02/20/2016 1432   K 4.1 02/20/2016 1432   CL 105 11/17/2015 0448   CO2 25 02/20/2016 1432   BUN 11.1 02/20/2016 1432   CREATININE 1.0 02/20/2016 1432      Component Value Date/Time   CALCIUM 9.8 02/20/2016 1432   ALKPHOS 115 02/20/2016 1432   AST 13 02/20/2016 1432   ALT 13 02/20/2016 1432   BILITOT 0.71 02/20/2016 1432      Impression and Plan:  47 year old woman with:  1.Polycythemia vera presented with elevated hemoglobin and positive Jak 2 mutation in September 2017.  Her disease has been under excellent control with intermittent phlebotomy to keep her hemoglobin below 45.  Her hematocrit today remains stable at 42.5 with no need for phlebotomy at this time.  Risks and benefits of continuing monthly observation and potential phlebotomy was discussed today she is agreeable to continue with the same  schedule.   2. Thrombosis prophylaxis: He remains on aspirin without any recent thrombosis or bleeding episodes.  3. Cytoreductive therapy: The rationale for using hydroxyurea was reviewed today.  Her risk of thrombosis is rather low and her platelet count remains under control.  We will continue to defer this option for the time being.  Her platelet count under control at this time.  4. Migraine headaches: Reasonably maintained on her current regimen.  No recent exacerbations.  5. Microcytosis: Related to iron deficiency but she remains asymptomatic.  6. Follow-up: Monthly for phlebotomy and have an MD follow-up in 6 months.  15  minutes was spent with the patient face-to-face today.  More than 50% of time was dedicated to discussing the natural course of this disease as well as treatment options and alternatives to therapy.   Zola Button, MD 8/13/20191:12 PM

## 2018-01-20 NOTE — Progress Notes (Signed)
No Phlebotomy today, Pt's HCT 42.5, Lab results given, Pt. Verbalized understanding

## 2018-01-20 NOTE — Telephone Encounter (Signed)
Printed avs and calender of upcoming appointment. Per 8/13 los 

## 2018-01-27 ENCOUNTER — Other Ambulatory Visit: Payer: 59

## 2018-01-27 ENCOUNTER — Ambulatory Visit: Payer: 59 | Admitting: Oncology

## 2018-02-17 ENCOUNTER — Inpatient Hospital Stay: Payer: 59 | Attending: Oncology

## 2018-02-17 ENCOUNTER — Inpatient Hospital Stay: Payer: 59

## 2018-03-17 ENCOUNTER — Inpatient Hospital Stay: Payer: 59

## 2018-03-19 ENCOUNTER — Inpatient Hospital Stay: Payer: 59

## 2018-03-19 ENCOUNTER — Inpatient Hospital Stay: Payer: 59 | Attending: Oncology

## 2018-04-21 ENCOUNTER — Other Ambulatory Visit: Payer: Self-pay | Admitting: Oncology

## 2018-04-21 ENCOUNTER — Inpatient Hospital Stay: Payer: 59 | Attending: Oncology

## 2018-04-21 ENCOUNTER — Inpatient Hospital Stay: Payer: 59

## 2018-04-21 VITALS — BP 133/90 | HR 83 | Temp 97.5°F | Resp 18

## 2018-04-21 DIAGNOSIS — D45 Polycythemia vera: Secondary | ICD-10-CM | POA: Insufficient documentation

## 2018-04-21 LAB — CBC WITH DIFFERENTIAL (CANCER CENTER ONLY)
Abs Immature Granulocytes: 0.07 10*3/uL (ref 0.00–0.07)
Basophils Absolute: 0.2 10*3/uL — ABNORMAL HIGH (ref 0.0–0.1)
Basophils Relative: 2 %
Eosinophils Absolute: 0.7 10*3/uL — ABNORMAL HIGH (ref 0.0–0.5)
Eosinophils Relative: 7 %
HCT: 49.1 % — ABNORMAL HIGH (ref 36.0–46.0)
Hemoglobin: 13.6 g/dL (ref 12.0–15.0)
Immature Granulocytes: 1 %
Lymphocytes Relative: 19 %
Lymphs Abs: 1.9 10*3/uL (ref 0.7–4.0)
MCH: 18.3 pg — ABNORMAL LOW (ref 26.0–34.0)
MCHC: 27.7 g/dL — ABNORMAL LOW (ref 30.0–36.0)
MCV: 65.9 fL — ABNORMAL LOW (ref 80.0–100.0)
Monocytes Absolute: 0.5 10*3/uL (ref 0.1–1.0)
Monocytes Relative: 5 %
Neutro Abs: 6.7 10*3/uL (ref 1.7–7.7)
Neutrophils Relative %: 66 %
Platelet Count: 725 10*3/uL — ABNORMAL HIGH (ref 150–400)
RBC: 7.45 MIL/uL — ABNORMAL HIGH (ref 3.87–5.11)
RDW: 23.5 % — ABNORMAL HIGH (ref 11.5–15.5)
WBC Count: 10.1 10*3/uL (ref 4.0–10.5)
nRBC: 0 % (ref 0.0–0.2)

## 2018-04-21 NOTE — Patient Instructions (Signed)

## 2018-05-19 ENCOUNTER — Inpatient Hospital Stay: Payer: 59

## 2018-05-19 ENCOUNTER — Inpatient Hospital Stay: Payer: 59 | Attending: Oncology

## 2018-05-19 VITALS — BP 117/68 | HR 82 | Temp 98.2°F | Resp 17 | Ht 63.0 in | Wt 192.5 lb

## 2018-05-19 DIAGNOSIS — D45 Polycythemia vera: Secondary | ICD-10-CM

## 2018-05-19 LAB — CBC WITH DIFFERENTIAL (CANCER CENTER ONLY)
Abs Immature Granulocytes: 0.05 10*3/uL (ref 0.00–0.07)
Basophils Absolute: 0.3 10*3/uL — ABNORMAL HIGH (ref 0.0–0.1)
Basophils Relative: 3 %
Eosinophils Absolute: 0.5 10*3/uL (ref 0.0–0.5)
Eosinophils Relative: 5 %
HCT: 46.4 % — ABNORMAL HIGH (ref 36.0–46.0)
Hemoglobin: 12.7 g/dL (ref 12.0–15.0)
Immature Granulocytes: 1 %
Lymphocytes Relative: 23 %
Lymphs Abs: 2.1 10*3/uL (ref 0.7–4.0)
MCH: 18.3 pg — ABNORMAL LOW (ref 26.0–34.0)
MCHC: 27.4 g/dL — ABNORMAL LOW (ref 30.0–36.0)
MCV: 67 fL — ABNORMAL LOW (ref 80.0–100.0)
Monocytes Absolute: 0.4 10*3/uL (ref 0.1–1.0)
Monocytes Relative: 4 %
Neutro Abs: 6 10*3/uL (ref 1.7–7.7)
Neutrophils Relative %: 64 %
Platelet Count: 687 10*3/uL — ABNORMAL HIGH (ref 150–400)
RBC: 6.93 MIL/uL — ABNORMAL HIGH (ref 3.87–5.11)
RDW: 23 % — ABNORMAL HIGH (ref 11.5–15.5)
WBC Count: 9.2 10*3/uL (ref 4.0–10.5)
nRBC: 0 % (ref 0.0–0.2)

## 2018-05-19 NOTE — Progress Notes (Signed)
Per MD [name] pt to receive therapeutic phlebotomy today based on Hct 46.4.  Pt agrees with plan. Started at 1503 and ended at 1511 550 mls removed from 18G in RAC. Tolerated well.  VSS.  Pt stayed for 10 minutes following phlebotomy and accepted food/drink.  Pt reports gen achiness with increased pain in feet bilat around the ball of the foot.  States she already followed up with her PCP about it who said it was P.F., advised pt to wear supportive shoe.  Pt states it hasn't gotten better.  [facility] aware, states he doesn't believe it's related to her disease process/lab values today.  Pt advised to continue with PCP's treatment but that if her pain doesn't improve then she can follow up with the [facility] as needed.  Verbalized understanding.

## 2018-05-19 NOTE — Patient Instructions (Signed)

## 2018-06-16 ENCOUNTER — Inpatient Hospital Stay: Payer: Medicare Other

## 2018-06-16 ENCOUNTER — Inpatient Hospital Stay: Payer: Medicare Other | Attending: Oncology

## 2018-07-10 ENCOUNTER — Other Ambulatory Visit: Payer: Self-pay | Admitting: Family Medicine

## 2018-07-10 DIAGNOSIS — R109 Unspecified abdominal pain: Secondary | ICD-10-CM

## 2018-07-13 ENCOUNTER — Ambulatory Visit
Admission: RE | Admit: 2018-07-13 | Discharge: 2018-07-13 | Disposition: A | Discharge status: Home / Self Care | Payer: Self-pay | Source: Ambulatory Visit | Attending: Family Medicine | Admitting: Family Medicine

## 2018-07-13 DIAGNOSIS — R109 Unspecified abdominal pain: Secondary | ICD-10-CM

## 2018-07-21 ENCOUNTER — Inpatient Hospital Stay: Payer: Commercial Managed Care - PPO | Attending: Oncology | Admitting: Oncology

## 2018-07-21 ENCOUNTER — Inpatient Hospital Stay: Payer: Commercial Managed Care - PPO

## 2018-07-21 ENCOUNTER — Telehealth: Payer: Self-pay | Admitting: Oncology

## 2018-07-21 VITALS — BP 135/89 | HR 97 | Temp 99.1°F | Resp 17 | Ht 63.0 in | Wt 191.1 lb

## 2018-07-21 DIAGNOSIS — Z79899 Other long term (current) drug therapy: Secondary | ICD-10-CM | POA: Insufficient documentation

## 2018-07-21 DIAGNOSIS — R718 Other abnormality of red blood cells: Secondary | ICD-10-CM

## 2018-07-21 DIAGNOSIS — Z7982 Long term (current) use of aspirin: Secondary | ICD-10-CM | POA: Diagnosis not present

## 2018-07-21 DIAGNOSIS — D45 Polycythemia vera: Secondary | ICD-10-CM

## 2018-07-21 DIAGNOSIS — G43909 Migraine, unspecified, not intractable, without status migrainosus: Secondary | ICD-10-CM

## 2018-07-21 LAB — CBC WITH DIFFERENTIAL (CANCER CENTER ONLY)
Abs Immature Granulocytes: 0.05 10*3/uL (ref 0.00–0.07)
Basophils Absolute: 0.2 10*3/uL — ABNORMAL HIGH (ref 0.0–0.1)
Basophils Relative: 2 %
Eosinophils Absolute: 0.6 10*3/uL — ABNORMAL HIGH (ref 0.0–0.5)
Eosinophils Relative: 7 %
HCT: 43.9 % (ref 36.0–46.0)
Hemoglobin: 12.1 g/dL (ref 12.0–15.0)
Immature Granulocytes: 1 %
Lymphocytes Relative: 18 %
Lymphs Abs: 1.7 10*3/uL (ref 0.7–4.0)
MCH: 18 pg — ABNORMAL LOW (ref 26.0–34.0)
MCHC: 27.6 g/dL — ABNORMAL LOW (ref 30.0–36.0)
MCV: 65.3 fL — ABNORMAL LOW (ref 80.0–100.0)
Monocytes Absolute: 0.4 10*3/uL (ref 0.1–1.0)
Monocytes Relative: 5 %
Neutro Abs: 6.5 10*3/uL (ref 1.7–7.7)
Neutrophils Relative %: 67 %
Platelet Count: 719 10*3/uL — ABNORMAL HIGH (ref 150–400)
RBC: 6.72 MIL/uL — ABNORMAL HIGH (ref 3.87–5.11)
RDW: 22.8 % — ABNORMAL HIGH (ref 11.5–15.5)
WBC Count: 9.4 10*3/uL (ref 4.0–10.5)
nRBC: 0 % (ref 0.0–0.2)

## 2018-07-21 NOTE — Progress Notes (Signed)
Hematology and Oncology Follow Up Visit  Eileen [name] [id number] 1970/07/23 48 y.o. 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   Current therapy: Aspirin 325 mg daily.  Therapeutic phlebotomy every 4 weeks to keep her hematocrit less than 45.    Interim History: Eileen [name] returns today for a repeat evaluation.  Since the last visit, she reports no major changes in her health.  She does report epigastric abdominal discomfort suspicious for peptic ulcer.  She has not reported any bleeding issues including hematochezia, melena or hemoptysis.  Her performance status and quality of life remains excellent without any recent hospitalizations or illnesses.  She has tolerated phlebotomy on a monthly basis without any concerns.   Patient denied any alteration mental status, neuropathy, confusion or dizziness.  Denies any headaches or lethargy.  Denies any night sweats, weight loss or changes in appetite.  Denied orthopnea, dyspnea on exertion or chest discomfort.  Denies shortness of breath, difficulty breathing hemoptysis or cough.  Denies any abdominal distention, nausea, early satiety or dyspepsia.  Denies any hematuria, frequency, dysuria or nocturia.  Denies any skin irritation, dryness or rash.  Denies any ecchymosis or petechiae.  Denies any lymphadenopathy or clotting.  Denies any heat or cold intolerance.  Denies any anxiety or depression.  Remaining review of system is negative.    Medications: I have reviewed the patient's current medications.  Current Outpatient Medications  Medication Sig Dispense Refill  . aspirin EC 325 MG tablet Take 1 tablet (325 mg total) by mouth daily. 30 tablet 3  . butalbital-aspirin-caffeine (FIORINAL) 50-325-40 MG capsule Take 1 capsule by mouth 2 (two) times daily as needed for  headache.    . Diclofenac Potassium 50 MG PACK Take 50 mg by mouth once as needed. For migraine. 9 each 11  . prochlorperazine (COMPAZINE) 5 MG tablet Take 1 tablet (5 mg total) by mouth every 8 (eight) hours as needed for nausea. 30 tablet 3  . PROPRANOLOL HCL PO Take by mouth.    . rizatriptan (MAXALT) 10 MG tablet Take 1 tablet (10 mg total) by mouth as needed for migraine. May repeat in 2 hours if needed 10 tablet 11  . Topiramate ER (QUDEXY XR) 150 MG CS24 sprinkle capsule Take 150 mg by mouth at bedtime. 30 each 11  . traMADol (ULTRAM) 50 MG tablet Take 1 tablet (50 mg total) by mouth every 6 (six) hours as needed for moderate pain. 30 tablet 0   No current facility-administered medications for this visit.    Facility-Administered Medications Ordered in Other Visits  Medication Dose Route Frequency Provider Last Rate Last Dose  . gadopentetate dimeglumine (MAGNEVIST) injection 18 mL  18 mL Intravenous Once PRN Melvenia Beam, MD         Allergies: No Known Allergies  Past Medical History, Surgical history, Social history, and Family History personally reviewed and updated.  Physical Exam:  Blood pressure 135/89, pulse 97, temperature 99.1 F (37.3 C), temperature source Oral, resp. rate 17, height 5\' 3"  (1.6 m), weight 191 lb 1.6 oz (86.7 kg), SpO2 100 %.    ECOG: 0   General appearance: Comfortable appearing without any discomfort Head: Normocephalic without any trauma Oropharynx: Mucous membranes are moist and pink without any thrush or ulcers. Eyes: Pupils are equal and round reactive to light. Lymph nodes: No cervical, supraclavicular, inguinal  or axillary lymphadenopathy.   Heart:regular rate and rhythm.  S1 and S2 without leg edema. Lung: Clear without any rhonchi or wheezes.  No dullness to percussion. Abdomin: Soft, nontender, nondistended with good bowel sounds.  No hepatosplenomegaly. Musculoskeletal: No joint deformity or effusion.  Full range of motion  noted. Neurological: No deficits noted on motor, sensory and deep tendon reflex exam. Skin: No petechial rash or dryness.  Appeared moist.    Lab Results: Lab Results  Component Value Date   WBC 9.2 05/19/2018   HGB 12.7 05/19/2018   HCT 46.4 (H) 05/19/2018   MCV 67.0 (L) 05/19/2018   PLT 687 (H) 05/19/2018     Chemistry      Component Value Date/Time   NA 140 02/20/2016 1432   K 4.1 02/20/2016 1432   CL 105 11/17/2015 0448   CO2 25 02/20/2016 1432   BUN 11.1 02/20/2016 1432   CREATININE 1.0 02/20/2016 1432      Component Value Date/Time   CALCIUM 9.8 02/20/2016 1432   ALKPHOS 115 02/20/2016 1432   AST 13 02/20/2016 1432   ALT 13 02/20/2016 1432   BILITOT 0.71 02/20/2016 1432      Impression and Plan:  48 year old woman with:  1.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  She continues to receive phlebotomy on a monthly basis to keep her hematocrit below 45.  The natural course of her disease was updated again and risks and benefits of proceeding with phlebotomy was reviewed.  At this time, her hematocrit is below 45 and we will not require any phlebotomy.   2. Thrombosis prophylaxis: No thrombosis or bleeding episodes noted.  Continues to be on aspirin.  3. Cytoreductive therapy: The role for hydroxyurea was discussed again today to control her platelet count.  Plan is to consider this in the future if her platelet count is above 800.  Platelet count remains adequate at this time.  4. Migraine headaches: No recent exacerbation noted at this time.  5. Microcytosis: Related to iron deficiency without anemia.  We will continue to monitor no iron replacement at this time given her high hemoglobin levels.  6. Follow-up: She will continue to follow monthly for phlebotomy and MD follow-up in 6 months.  15  minutes was spent with the patient face-to-face today.  More than 50% of time was dedicated to reviewing the  natural course of her disease, treatment options and managing complications related to therapy.   Zola Button, MD 2/11/20202:41 PM

## 2018-07-21 NOTE — Telephone Encounter (Signed)
Gave patient avs report and appointments for March thru August.

## 2018-08-18 ENCOUNTER — Telehealth: Payer: Self-pay

## 2018-08-18 ENCOUNTER — Inpatient Hospital Stay: Payer: No Typology Code available for payment source | Attending: Oncology

## 2018-08-18 ENCOUNTER — Inpatient Hospital Stay: Payer: No Typology Code available for payment source

## 2018-08-18 DIAGNOSIS — D45 Polycythemia vera: Secondary | ICD-10-CM | POA: Insufficient documentation

## 2018-08-18 LAB — CBC WITH DIFFERENTIAL (CANCER CENTER ONLY)
Abs Immature Granulocytes: 0.05 10*3/uL (ref 0.00–0.07)
Basophils Absolute: 0.2 10*3/uL — ABNORMAL HIGH (ref 0.0–0.1)
Basophils Relative: 3 %
Eosinophils Absolute: 0.5 10*3/uL (ref 0.0–0.5)
Eosinophils Relative: 6 %
HCT: 46 % (ref 36.0–46.0)
Hemoglobin: 12.2 g/dL (ref 12.0–15.0)
Immature Granulocytes: 1 %
Lymphocytes Relative: 20 %
Lymphs Abs: 1.8 10*3/uL (ref 0.7–4.0)
MCH: 17.8 pg — ABNORMAL LOW (ref 26.0–34.0)
MCHC: 26.5 g/dL — ABNORMAL LOW (ref 30.0–36.0)
MCV: 67.1 fL — ABNORMAL LOW (ref 80.0–100.0)
Monocytes Absolute: 0.3 10*3/uL (ref 0.1–1.0)
Monocytes Relative: 4 %
Neutro Abs: 6 10*3/uL (ref 1.7–7.7)
Neutrophils Relative %: 66 %
Platelet Count: 690 10*3/uL — ABNORMAL HIGH (ref 150–400)
RBC: 6.86 MIL/uL — ABNORMAL HIGH (ref 3.87–5.11)
RDW: 23.3 % — ABNORMAL HIGH (ref 11.5–15.5)
WBC Count: 8.9 10*3/uL (ref 4.0–10.5)
nRBC: 0 % (ref 0.0–0.2)

## 2018-08-18 NOTE — Telephone Encounter (Signed)
Dr. [name] made aware of today's CBC/diff results. Per Dr. [name] no therapeutic phlebotomy needed today. Patient made aware and verbalized understanding.

## 2018-09-11 ENCOUNTER — Telehealth: Payer: Self-pay

## 2018-09-11 NOTE — Telephone Encounter (Signed)
Contacted patient and made aware that the lab and phlebotomy appointment for 4/7 are being canceled at this time after MD review and attempting to decrease patient exposure related to COVID 19. Patient verbalized understanding and agreeable but wants to keep the 5/5 appointments at this time and decide if those appointments can be canceled closer to the time depending on how she is feeling.

## 2018-09-15 ENCOUNTER — Inpatient Hospital Stay: Payer: No Typology Code available for payment source

## 2018-10-13 ENCOUNTER — Inpatient Hospital Stay: Payer: No Typology Code available for payment source | Attending: Oncology

## 2018-10-13 ENCOUNTER — Inpatient Hospital Stay: Payer: No Typology Code available for payment source

## 2018-11-10 ENCOUNTER — Inpatient Hospital Stay: Payer: No Typology Code available for payment source

## 2018-11-10 ENCOUNTER — Telehealth: Payer: Self-pay

## 2018-11-10 ENCOUNTER — Other Ambulatory Visit: Payer: Self-pay

## 2018-11-10 ENCOUNTER — Inpatient Hospital Stay: Payer: No Typology Code available for payment source | Attending: Oncology

## 2018-11-10 VITALS — BP 119/81 | HR 78 | Temp 98.2°F | Resp 16

## 2018-11-10 DIAGNOSIS — D45 Polycythemia vera: Secondary | ICD-10-CM

## 2018-11-10 LAB — CBC WITH DIFFERENTIAL (CANCER CENTER ONLY)
Abs Immature Granulocytes: 0.03 10*3/uL (ref 0.00–0.07)
Basophils Absolute: 0.2 10*3/uL — ABNORMAL HIGH (ref 0.0–0.1)
Basophils Relative: 3 %
Eosinophils Absolute: 0.5 10*3/uL (ref 0.0–0.5)
Eosinophils Relative: 6 %
HCT: 47.1 % — ABNORMAL HIGH (ref 36.0–46.0)
Hemoglobin: 12.5 g/dL (ref 12.0–15.0)
Immature Granulocytes: 0 %
Lymphocytes Relative: 20 %
Lymphs Abs: 1.7 10*3/uL (ref 0.7–4.0)
MCH: 17.9 pg — ABNORMAL LOW (ref 26.0–34.0)
MCHC: 26.5 g/dL — ABNORMAL LOW (ref 30.0–36.0)
MCV: 67.6 fL — ABNORMAL LOW (ref 80.0–100.0)
Monocytes Absolute: 0.4 10*3/uL (ref 0.1–1.0)
Monocytes Relative: 5 %
Neutro Abs: 5.6 10*3/uL (ref 1.7–7.7)
Neutrophils Relative %: 66 %
Platelet Count: 642 10*3/uL — ABNORMAL HIGH (ref 150–400)
RBC: 6.97 MIL/uL — ABNORMAL HIGH (ref 3.87–5.11)
RDW: 23.2 % — ABNORMAL HIGH (ref 11.5–15.5)
WBC Count: 8.4 10*3/uL (ref 4.0–10.5)
nRBC: 0 % (ref 0.0–0.2)

## 2018-11-10 MED ORDER — SODIUM CHLORIDE 0.9 % IV SOLN
Freq: Once | INTRAVENOUS | Status: AC
  Administered 2018-11-10: 11:00:00 via INTRAVENOUS
  Filled 2018-11-10: qty 250

## 2018-11-10 NOTE — Patient Instructions (Addendum)
Therapeutic Phlebotomy, Care After This sheet gives you information about how to care for yourself after your procedure. Your health care provider may also give you more specific instructions. If you have problems or questions, contact your health care provider. What can I expect after the procedure? After the procedure, it is common to have:  Light-headedness or dizziness. You may feel faint.  Nausea.  Tiredness (fatigue). Follow these instructions at home: Eating and drinking  Be sure to eat well-balanced meals for the next 24 hours.  Drink enough fluid to keep your urine pale yellow.  Avoid drinking alcohol on the day that you had the procedure. Activity   Return to your normal activities as told by your health care provider. Most people can go back to their normal activities right away.  Avoid activities that take a lot of effort for about 5 hours after the procedure. Athletes should avoid strenuous exercise for at least 12 hours.  Avoid heavy lifting or pulling for about 5 hours after the procedure. Do not lift anything that is heavier than 10 lb (4.5 kg).  Change positions slowly for the remainder of the day. This will help to prevent light-headedness or fainting.  If you feel light-headed, lie down until the feeling goes away. Needle insertion site care   Keep your bandage (dressing) dry. You can remove the bandage after about 5 hours or as told by your health care provider.  If you have bleeding from the needle insertion site, raise (elevate) your arm and press firmly on the site until the bleeding stops.  If you have bruising at the site, apply ice to the area: ? Remove the dressing. ? Put ice in a plastic bag. ? Place a towel between your skin and the bag. ? Leave the ice on for 20 minutes, 2-3 times a day for the first 24 hours.  If the swelling does not go away after 24 hours, apply a warm, moist cloth (warm compress) to the area for 20 minutes, 2-3 times a day.  General instructions  Do not use any products that contain nicotine or tobacco, such as cigarettes and e-cigarettes, for at least 30 minutes after the procedure.  Keep all follow-up visits as told by your health care provider. This is important. You may need to continue having regular therapeutic phlebotomy treatments as directed. Contact a health care provider if you:  Have redness, swelling, or pain at the needle insertion site.  Have fluid or blood coming from the needle insertion site.  Have pus or a bad smell coming from the needle insertion site.  Notice that the needle insertion site feels warm to the touch.  Feel light-headed, dizzy, or nauseous, and the feeling does not go away.  Have new bruising at the needle insertion site.  Feel weaker than normal.  Have a fever or chills. Get help right away if:  You faint.  You have chest pain.  You have trouble breathing.  You have severe nausea or vomiting. Summary  After the procedure, it is common to have some light-headedness, dizziness, nausea, or tiredness (fatigue).  Be sure to eat well-balanced meals for the next 24 hours. Drink enough fluid to keep your urine pale yellow.  Return to your normal activities as told by your health care provider.  Keep all follow-up visits as told by your health care provider. You may need to continue having regular therapeutic phlebotomy treatments as directed. This information is not intended to replace advice given to you   by your health care provider. Make sure you discuss any questions you have with your health care provider. Document Released: 10/29/2010 Document Revised: 06/12/2017 Document Reviewed: 06/12/2017 Elsevier Interactive Patient Education  2019 [employer]  Coronavirus (COVID-19) Are you at risk?  Are you at risk for the Coronavirus (COVID-19)?  To be considered HIGH RISK for Coronavirus (COVID-19), you have to meet the following criteria:  . Traveled to  [place], [place], [place], [place] or [place]; or in the [place] to [place], [place], [place], or [place]; and have fever, cough, and shortness of breath within the last 2 weeks of travel OR . Been in close contact with a person diagnosed with COVID-19 within the last 2 weeks and have fever, cough, and shortness of breath . IF YOU DO NOT MEET THESE CRITERIA, YOU ARE CONSIDERED LOW RISK FOR COVID-19.  What to do if you are HIGH RISK for COVID-19?  . If you are having a medical emergency, call 911. . Seek medical care right away. Before you go to a doctor's office, urgent care or emergency department, call ahead and tell them about your recent travel, contact with someone diagnosed with COVID-19, and your symptoms. You should receive instructions from your physician's office regarding next steps of care.  . When you arrive at healthcare provider, tell the healthcare staff immediately you have returned from visiting [place], [place], [place], [place] or [place]; or traveled in the [place] to [place], [place], [place], or [place]; in the last two weeks or you have been in close contact with a person diagnosed with COVID-19 in the last 2 weeks.   . Tell the health care staff about your symptoms: fever, cough and shortness of breath. . After you have been seen by a medical provider, you will be either: o Tested for (COVID-19) and discharged home on quarantine except to seek medical care if symptoms worsen, and asked to  - Stay home and avoid contact with others until you get your results (4-5 days)  - Avoid travel on public transportation if possible (such as bus, train, or airplane) or o Sent to the Emergency Department by EMS for evaluation, COVID-19 testing, and possible admission depending on your condition and test results.  What to do if you are LOW RISK for COVID-19?  Reduce your risk of any infection by using the same precautions used for avoiding the common cold or flu:   . Wash your hands often with soap and warm water for at least 20 seconds.  If soap and water are not readily available, use an alcohol-based hand sanitizer with at least 60% alcohol.  . If coughing or sneezing, cover your mouth and nose by coughing or sneezing into the elbow areas of your shirt or coat, into a tissue or into your sleeve (not your hands). . Avoid shaking hands with others and consider head nods or verbal greetings only. . Avoid touching your eyes, nose, or mouth with unwashed hands.  . Avoid close contact with people who are sick. . Avoid places or events with large numbers of people in one location, like concerts or sporting events. . Carefully consider travel plans you have or are making. . If you are planning any travel outside or inside the [place], visit the CDC's Travelers' Health webpage for the latest health notices. . If you have some symptoms but not all symptoms, continue to monitor at home and seek medical attention if your symptoms worsen. . If you   are having a medical emergency, call 911.   ADDITIONAL HEALTHCARE OPTIONS FOR PATIENTS  [facility] / e-Visit: [email or web address]         [facility]: [phone]  [facility]: [phone]                   [facility]: [phone]   

## 2018-11-10 NOTE — Progress Notes (Signed)
Unable to successfully phlebotomize patient after 3 sticks (RAC, LAC, RFA) and 581mL IVF. Per Dr. [name]: send patient home and his RN will follow up with patient to reschedule/come up with another plan. Information relayed to patient who verbalized understanding and agreement. Copy of labs and drink provided, and patient ambulated out of clinic without incident.

## 2018-11-10 NOTE — Telephone Encounter (Signed)
Follow up conversation with patient regarding unsuccessful phlebotomy and patient stated that she is having joint/body pain, red and swollen feet that are painful. She is concerned about waiting until her next appointment and would like to try again this week if appropriate. Per Dr. [name] a scheduling message was sent to schedule patient for a phlebotomy this Friday 6/5. Patient is aware and understands to hydrate prior to Friday appointment.

## 2018-11-13 ENCOUNTER — Other Ambulatory Visit: Payer: Self-pay

## 2018-11-13 ENCOUNTER — Inpatient Hospital Stay: Payer: No Typology Code available for payment source

## 2018-11-13 VITALS — BP 112/75 | HR 76 | Temp 98.7°F | Resp 16

## 2018-11-13 DIAGNOSIS — D45 Polycythemia vera: Secondary | ICD-10-CM | POA: Diagnosis not present

## 2018-11-13 NOTE — Progress Notes (Signed)
Eileen [name] presents today for phlebotomy per MD orders. Phlebotomy procedure started at 10:30 and ended at 10:37 515 grams removed. Patient observed for 30 minutes after procedure without any incident. Patient tolerated procedure well. IV needle removed intact.  Food and drink offered, drink taken.

## 2018-11-13 NOTE — Patient Instructions (Signed)
Therapeutic Phlebotomy, Care After This sheet gives you information about how to care for yourself after your procedure. Your health care provider may also give you more specific instructions. If you have problems or questions, contact your health care provider. What can I expect after the procedure? After the procedure, it is common to have:  Light-headedness or dizziness. You may feel faint.  Nausea.  Tiredness (fatigue). Follow these instructions at home: Eating and drinking  Be sure to eat well-balanced meals for the next 24 hours.  Drink enough fluid to keep your urine pale yellow.  Avoid drinking alcohol on the day that you had the procedure. Activity   Return to your normal activities as told by your health care provider. Most people can go back to their normal activities right away.  Avoid activities that take a lot of effort for about 5 hours after the procedure. Athletes should avoid strenuous exercise for at least 12 hours.  Avoid heavy lifting or pulling for about 5 hours after the procedure. Do not lift anything that is heavier than 10 lb (4.5 kg).  Change positions slowly for the remainder of the day. This will help to prevent light-headedness or fainting.  If you feel light-headed, lie down until the feeling goes away. Needle insertion site care   Keep your bandage (dressing) dry. You can remove the bandage after about 5 hours or as told by your health care provider.  If you have bleeding from the needle insertion site, raise (elevate) your arm and press firmly on the site until the bleeding stops.  If you have bruising at the site, apply ice to the area: ? Remove the dressing. ? Put ice in a plastic bag. ? Place a towel between your skin and the bag. ? Leave the ice on for 20 minutes, 2-3 times a day for the first 24 hours.  If the swelling does not go away after 24 hours, apply a warm, moist cloth (warm compress) to the area for 20 minutes, 2-3 times a day.  General instructions  Do not use any products that contain nicotine or tobacco, such as cigarettes and e-cigarettes, for at least 30 minutes after the procedure.  Keep all follow-up visits as told by your health care provider. This is important. You may need to continue having regular therapeutic phlebotomy treatments as directed. Contact a health care provider if you:  Have redness, swelling, or pain at the needle insertion site.  Have fluid or blood coming from the needle insertion site.  Have pus or a bad smell coming from the needle insertion site.  Notice that the needle insertion site feels warm to the touch.  Feel light-headed, dizzy, or nauseous, and the feeling does not go away.  Have new bruising at the needle insertion site.  Feel weaker than normal.  Have a fever or chills. Get help right away if:  You faint.  You have chest pain.  You have trouble breathing.  You have severe nausea or vomiting. Summary  After the procedure, it is common to have some light-headedness, dizziness, nausea, or tiredness (fatigue).  Be sure to eat well-balanced meals for the next 24 hours. Drink enough fluid to keep your urine pale yellow.  Return to your normal activities as told by your health care provider.  Keep all follow-up visits as told by your health care provider. You may need to continue having regular therapeutic phlebotomy treatments as directed. This information is not intended to replace advice given to you   by your health care provider. Make sure you discuss any questions you have with your health care provider. Document Released: 10/29/2010 Document Revised: 06/12/2017 Document Reviewed: 06/12/2017 Elsevier Interactive Patient Education  2019 [employer] Inc.  Coronavirus (COVID-19) Are you at risk?  Are you at risk for the Coronavirus (COVID-19)?  To be considered HIGH RISK for Coronavirus (COVID-19), you have to meet the following criteria:  . Traveled to  [place], [place], [place], [place] or [place]; or in the [place] to [place], [place], [place], or [place]; and have fever, cough, and shortness of breath within the last 2 weeks of travel OR . Been in close contact with a person diagnosed with COVID-19 within the last 2 weeks and have fever, cough, and shortness of breath . IF YOU DO NOT MEET THESE CRITERIA, YOU ARE CONSIDERED LOW RISK FOR COVID-19.  What to do if you are HIGH RISK for COVID-19?  . If you are having a medical emergency, call 911. . Seek medical care right away. Before you go to a doctor's office, urgent care or emergency department, call ahead and tell them about your recent travel, contact with someone diagnosed with COVID-19, and your symptoms. You should receive instructions from your physician's office regarding next steps of care.  . When you arrive at healthcare provider, tell the healthcare staff immediately you have returned from visiting [place], [place], [place], [place] or [place]; or traveled in the [place] to [place], [place], [place], or [place]; in the last two weeks or you have been in close contact with a person diagnosed with COVID-19 in the last 2 weeks.   . Tell the health care staff about your symptoms: fever, cough and shortness of breath. . After you have been seen by a medical provider, you will be either: o Tested for (COVID-19) and discharged home on quarantine except to seek medical care if symptoms worsen, and asked to  - Stay home and avoid contact with others until you get your results (4-5 days)  - Avoid travel on public transportation if possible (such as bus, train, or airplane) or o Sent to the Emergency Department by EMS for evaluation, COVID-19 testing, and possible admission depending on your condition and test results.  What to do if you are LOW RISK for COVID-19?  Reduce your risk of any infection by using the same precautions used for avoiding the common cold or flu:   . Wash your hands often with soap and warm water for at least 20 seconds.  If soap and water are not readily available, use an alcohol-based hand sanitizer with at least 60% alcohol.  . If coughing or sneezing, cover your mouth and nose by coughing or sneezing into the elbow areas of your shirt or coat, into a tissue or into your sleeve (not your hands). . Avoid shaking hands with others and consider head nods or verbal greetings only. . Avoid touching your eyes, nose, or mouth with unwashed hands.  . Avoid close contact with people who are sick. . Avoid places or events with large numbers of people in one location, like concerts or sporting events. . Carefully consider travel plans you have or are making. . If you are planning any travel outside or inside the [place], visit the CDC's Travelers' Health webpage for the latest health notices. . If you have some symptoms but not all symptoms, continue to monitor at home and seek medical attention if your symptoms worsen. . If you   are having a medical emergency, call 911.   ADDITIONAL HEALTHCARE OPTIONS FOR PATIENTS  [facility] / e-Visit: [email or web address]         [facility]: [phone]  [facility]: [phone]                   [facility]: [phone]   

## 2018-12-08 ENCOUNTER — Inpatient Hospital Stay: Payer: No Typology Code available for payment source

## 2018-12-08 ENCOUNTER — Other Ambulatory Visit: Payer: Self-pay

## 2018-12-08 DIAGNOSIS — D45 Polycythemia vera: Secondary | ICD-10-CM | POA: Diagnosis not present

## 2018-12-08 LAB — CBC WITH DIFFERENTIAL (CANCER CENTER ONLY)
Abs Immature Granulocytes: 0.04 10*3/uL (ref 0.00–0.07)
Basophils Absolute: 0.3 10*3/uL — ABNORMAL HIGH (ref 0.0–0.1)
Basophils Relative: 3 %
Eosinophils Absolute: 0.5 10*3/uL (ref 0.0–0.5)
Eosinophils Relative: 5 %
HCT: 43.5 % (ref 36.0–46.0)
Hemoglobin: 11.8 g/dL — ABNORMAL LOW (ref 12.0–15.0)
Immature Granulocytes: 0 %
Lymphocytes Relative: 22 %
Lymphs Abs: 2.1 10*3/uL (ref 0.7–4.0)
MCH: 18 pg — ABNORMAL LOW (ref 26.0–34.0)
MCHC: 27.1 g/dL — ABNORMAL LOW (ref 30.0–36.0)
MCV: 66.5 fL — ABNORMAL LOW (ref 80.0–100.0)
Monocytes Absolute: 0.4 10*3/uL (ref 0.1–1.0)
Monocytes Relative: 5 %
Neutro Abs: 5.9 10*3/uL (ref 1.7–7.7)
Neutrophils Relative %: 65 %
Platelet Count: 738 10*3/uL — ABNORMAL HIGH (ref 150–400)
RBC: 6.54 MIL/uL — ABNORMAL HIGH (ref 3.87–5.11)
RDW: 22.7 % — ABNORMAL HIGH (ref 11.5–15.5)
WBC Count: 9.2 10*3/uL (ref 4.0–10.5)
nRBC: 0 % (ref 0.0–0.2)

## 2018-12-08 NOTE — Progress Notes (Signed)
Phlebotomy treatment not needed based on today's HCT value of 43.5. PLT count=738. Dr. [name] was informed and had no new orders. Pt was informed and had no further questions or concerns.

## 2019-01-05 ENCOUNTER — Inpatient Hospital Stay: Payer: No Typology Code available for payment source

## 2019-01-05 ENCOUNTER — Inpatient Hospital Stay: Payer: No Typology Code available for payment source | Attending: Oncology

## 2019-02-01 ENCOUNTER — Other Ambulatory Visit: Payer: Self-pay

## 2019-02-01 DIAGNOSIS — D45 Polycythemia vera: Secondary | ICD-10-CM

## 2019-02-02 ENCOUNTER — Inpatient Hospital Stay: Payer: No Typology Code available for payment source | Attending: Oncology

## 2019-02-02 ENCOUNTER — Inpatient Hospital Stay: Payer: No Typology Code available for payment source

## 2019-02-02 ENCOUNTER — Other Ambulatory Visit: Payer: Self-pay

## 2019-02-02 ENCOUNTER — Inpatient Hospital Stay (HOSPITAL_BASED_OUTPATIENT_CLINIC_OR_DEPARTMENT_OTHER): Payer: No Typology Code available for payment source | Admitting: Oncology

## 2019-02-02 VITALS — BP 134/75 | HR 89 | Temp 98.2°F | Resp 18 | Ht 63.0 in | Wt 188.9 lb

## 2019-02-02 DIAGNOSIS — Z7982 Long term (current) use of aspirin: Secondary | ICD-10-CM | POA: Insufficient documentation

## 2019-02-02 DIAGNOSIS — D45 Polycythemia vera: Secondary | ICD-10-CM

## 2019-02-02 DIAGNOSIS — Z79899 Other long term (current) drug therapy: Secondary | ICD-10-CM | POA: Diagnosis not present

## 2019-02-02 DIAGNOSIS — G43909 Migraine, unspecified, not intractable, without status migrainosus: Secondary | ICD-10-CM | POA: Insufficient documentation

## 2019-02-02 DIAGNOSIS — R718 Other abnormality of red blood cells: Secondary | ICD-10-CM | POA: Diagnosis not present

## 2019-02-02 DIAGNOSIS — D500 Iron deficiency anemia secondary to blood loss (chronic): Secondary | ICD-10-CM

## 2019-02-02 LAB — CBC WITH DIFFERENTIAL (CANCER CENTER ONLY)
Abs Immature Granulocytes: 0.05 10*3/uL (ref 0.00–0.07)
Basophils Absolute: 0.3 10*3/uL — ABNORMAL HIGH (ref 0.0–0.1)
Basophils Relative: 3 %
Eosinophils Absolute: 0.6 10*3/uL — ABNORMAL HIGH (ref 0.0–0.5)
Eosinophils Relative: 6 %
HCT: 43.7 % (ref 36.0–46.0)
Hemoglobin: 11.9 g/dL — ABNORMAL LOW (ref 12.0–15.0)
Immature Granulocytes: 1 %
Lymphocytes Relative: 21 %
Lymphs Abs: 2 10*3/uL (ref 0.7–4.0)
MCH: 18 pg — ABNORMAL LOW (ref 26.0–34.0)
MCHC: 27.2 g/dL — ABNORMAL LOW (ref 30.0–36.0)
MCV: 66.1 fL — ABNORMAL LOW (ref 80.0–100.0)
Monocytes Absolute: 0.5 10*3/uL (ref 0.1–1.0)
Monocytes Relative: 5 %
Neutro Abs: 6.2 10*3/uL (ref 1.7–7.7)
Neutrophils Relative %: 64 %
Platelet Count: 691 10*3/uL — ABNORMAL HIGH (ref 150–400)
RBC: 6.61 MIL/uL — ABNORMAL HIGH (ref 3.87–5.11)
RDW: 23.7 % — ABNORMAL HIGH (ref 11.5–15.5)
WBC Count: 9.5 10*3/uL (ref 4.0–10.5)
nRBC: 0 % (ref 0.0–0.2)

## 2019-02-02 NOTE — Progress Notes (Signed)
Hematology and Oncology Follow Up Visit  Eileen [name] [id number] 1970-11-03 48 y.o. 02/02/2019 11:38 AM Eileen [name], MDElkins, Eileen [name], *   Principle Diagnosis: 48 year old woman with polycythemia vera diagnosed in 2017.  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   Current therapy: Aspirin 325 mg daily.  Therapeutic phlebotomy every 4 weeks to keep her hematocrit less than 45.    Interim History: Eileen [name] is here for repeat evaluation.  Since the last visit, she reports feeling well without any recent complaints.  She denies any nausea or excessive fatigue.  She has tolerated phlebotomy reasonably well although she has not received it since June 2020.  She denies any recent thrombosis or bleeding complications.  She denies any migraine headache exacerbations.  She denied headaches, blurry vision, syncope or seizures.  Denies any fevers, chills or sweats.  Denied chest pain, palpitation, orthopnea or leg edema.  Denied cough, wheezing or hemoptysis.  Denied nausea, vomiting or abdominal pain.  Denies any constipation or diarrhea.  Denies any frequency urgency or hesitancy.  Denies any arthralgias or myalgias.  Denies any skin rashes or lesions.  Denies any bleeding or clotting tendency.  Denies any easy bruising.  Denies any hair or nail changes.  Denies any anxiety or depression.  Remaining review of system is negative.     Medications: Reviewed and updated. Current Outpatient Medications  Medication Sig Dispense Refill  . aspirin EC 325 MG tablet Take 1 tablet (325 mg total) by mouth daily. 30 tablet 3  . butalbital-aspirin-caffeine (FIORINAL) 50-325-40 MG capsule Take 1 capsule by mouth 2 (two) times daily as needed for headache.    . Diclofenac Potassium 50 MG PACK Take 50 mg by mouth once as needed. For migraine. 9 each 11  . prochlorperazine (COMPAZINE) 5 MG tablet Take 1 tablet (5 mg total) by mouth every 8 (eight) hours as needed for nausea. 30  tablet 3  . PROPRANOLOL HCL PO Take by mouth.    . rizatriptan (MAXALT) 10 MG tablet Take 1 tablet (10 mg total) by mouth as needed for migraine. May repeat in 2 hours if needed 10 tablet 11  . Topiramate ER (QUDEXY XR) 150 MG CS24 sprinkle capsule Take 150 mg by mouth at bedtime. 30 each 11  . traMADol (ULTRAM) 50 MG tablet Take 1 tablet (50 mg total) by mouth every 6 (six) hours as needed for moderate pain. 30 tablet 0   No current facility-administered medications for this visit.    Facility-Administered Medications Ordered in Other Visits  Medication Dose Route Frequency Provider Last Rate Last Dose  . gadopentetate dimeglumine (MAGNEVIST) injection 18 mL  18 mL Intravenous Once PRN Melvenia Beam, MD         Allergies: No Known Allergies  Past Medical History, Surgical history, Social history, and Family History without any change in review.  Physical Exam:   Blood pressure 134/75, pulse 89, temperature 98.2 F (36.8 C), temperature source Oral, resp. rate 18, height 5\' 3"  (1.6 m), weight 188 lb 14.4 oz (85.7 kg), SpO2 100 %.    ECOG: 0     General appearance: Alert, awake without any distress. Head: Atraumatic without abnormalities Oropharynx: Without any thrush or ulcers. Eyes: No scleral icterus. Lymph nodes: No lymphadenopathy noted in the cervical, supraclavicular, or axillary nodes Heart:regular rate and rhythm, without any murmurs or gallops.   Lung: Clear to auscultation without any rhonchi, wheezes or dullness to percussion. Abdomin: Soft, nontender without any shifting  dullness or ascites. Musculoskeletal: No clubbing or cyanosis. Neurological: No motor or sensory deficits. Skin: No rashes or lesions. Psychiatric: Mood and affect appeared normal.    Lab Results: Lab Results  Component Value Date   WBC 9.2 12/08/2018   HGB 11.8 (L) 12/08/2018   HCT 43.5 12/08/2018   MCV 66.5 (L) 12/08/2018   PLT 738 (H) 12/08/2018     Chemistry      Component  Value Date/Time   NA 140 02/20/2016 1432   K 4.1 02/20/2016 1432   CL 105 11/17/2015 0448   CO2 25 02/20/2016 1432   BUN 11.1 02/20/2016 1432   CREATININE 1.0 02/20/2016 1432      Component Value Date/Time   CALCIUM 9.8 02/20/2016 1432   ALKPHOS 115 02/20/2016 1432   AST 13 02/20/2016 1432   ALT 13 02/20/2016 1432   BILITOT 0.71 02/20/2016 1432      Impression and Plan:  48 year old woman with:  1.polycythemia vera diagnosed in 2017.  She was found to have Jak 2+ mutation at that time.  She has tolerated phlebotomy with excellent response with hemoglobin remains under adequate control.  Risks and benefits of continuing this approach on a monthly basis was reviewed.  At this time, we will continue with intermittent phlebotomy to keep her hematocrit close to 45.   2. Thrombosis prophylaxis: She continues to be on aspirin without any issues at this time.  No thrombosis or bleeding episodes.  3. Cytoreductive therapy: Her platelet count continues to rise which could also be exacerbated by iron deficiency associated with phlebotomy.  The role of hydroxyurea was reiterated at this time and complications associated with this therapy was discussed.  4. Migraine headaches: Under control at this time with very rare exacerbations.  5. Microcytosis: Related to iron deficiency from phlebotomy.  The role of iron replacement was discussed and will be avoided as long as she does not develop worsening anemia or symptoms.  6. Follow-up: Continue to follow-up for monthly evaluation and possible phlebotomy.  25  minutes was spent with the patient face-to-face today.  More than 50% of time was spent on reviewing her disease status, treatment options and complications related therapy.   Zola Button, MD 8/25/202011:38 AM

## 2019-02-03 ENCOUNTER — Telehealth: Payer: Self-pay | Admitting: Oncology

## 2019-02-03 NOTE — Telephone Encounter (Signed)
Called and left msg. Mailed printout  °

## 2019-03-02 ENCOUNTER — Inpatient Hospital Stay: Payer: No Typology Code available for payment source | Attending: Oncology

## 2019-03-02 ENCOUNTER — Other Ambulatory Visit: Payer: Self-pay

## 2019-03-02 ENCOUNTER — Inpatient Hospital Stay: Payer: No Typology Code available for payment source

## 2019-03-02 DIAGNOSIS — D45 Polycythemia vera: Secondary | ICD-10-CM | POA: Diagnosis present

## 2019-03-02 LAB — CBC WITH DIFFERENTIAL (CANCER CENTER ONLY)
Abs Immature Granulocytes: 0.05 10*3/uL (ref 0.00–0.07)
Basophils Absolute: 0.3 10*3/uL — ABNORMAL HIGH (ref 0.0–0.1)
Basophils Relative: 3 %
Eosinophils Absolute: 0.6 10*3/uL — ABNORMAL HIGH (ref 0.0–0.5)
Eosinophils Relative: 6 %
HCT: 44.3 % (ref 36.0–46.0)
Hemoglobin: 12 g/dL (ref 12.0–15.0)
Immature Granulocytes: 1 %
Lymphocytes Relative: 18 %
Lymphs Abs: 1.8 10*3/uL (ref 0.7–4.0)
MCH: 17.9 pg — ABNORMAL LOW (ref 26.0–34.0)
MCHC: 27.1 g/dL — ABNORMAL LOW (ref 30.0–36.0)
MCV: 66 fL — ABNORMAL LOW (ref 80.0–100.0)
Monocytes Absolute: 0.5 10*3/uL (ref 0.1–1.0)
Monocytes Relative: 5 %
Neutro Abs: 6.9 10*3/uL (ref 1.7–7.7)
Neutrophils Relative %: 67 %
Platelet Count: 615 10*3/uL — ABNORMAL HIGH (ref 150–400)
RBC: 6.71 MIL/uL — ABNORMAL HIGH (ref 3.87–5.11)
RDW: 23.2 % — ABNORMAL HIGH (ref 11.5–15.5)
WBC Count: 10.1 10*3/uL (ref 4.0–10.5)
nRBC: 0 % (ref 0.0–0.2)

## 2019-03-02 NOTE — Progress Notes (Signed)
Eileen [name] presents today for phlebotomy per MD orders. Phlebotomy procedure started at 1019 and ended at 1025.  502 grmas removed using 16 gauge needle in the right AC x 1 attempt. Patient tolerated procedure well. IV needle removed intact. Drink and snack provided. VSS.  Pt verbalizes she feels fine for discharge.

## 2019-03-02 NOTE — Patient Instructions (Signed)
Therapeutic Phlebotomy Therapeutic phlebotomy is the planned removal of blood from a person's body for the purpose of treating a medical condition. The procedure is similar to donating blood. Usually, about a pint (470 mL, or 0.47 L) of blood is removed. The average adult has 9-12 pints (4.3-5.7 L) of blood in the body. Therapeutic phlebotomy may be used to treat the following medical conditions:  Hemochromatosis. This is a condition in which the blood contains too much iron.  Polycythemia vera. This is a condition in which the blood contains too many red blood cells.  Porphyria cutanea tarda. This is a disease in which an important part of hemoglobin is not made properly. It results in the buildup of abnormal amounts of porphyrins in the body.  Sickle cell disease. This is a condition in which the red blood cells form an abnormal crescent shape rather than a round shape. Tell a health care provider about:  Any allergies you have.  All medicines you are taking, including vitamins, herbs, eye drops, creams, and over-the-counter medicines.  Any problems you or family members have had with anesthetic medicines.  Any blood disorders you have.  Any surgeries you have had.  Any medical conditions you have.  Whether you are pregnant or may be pregnant. What are the risks? Generally, this is a safe procedure. However, problems may occur, including:  Nausea or light-headedness.  Low blood pressure (hypotension).  Soreness, bleeding, swelling, or bruising at the needle insertion site.  Infection. What happens before the procedure?  Follow instructions from your health care provider about eating or drinking restrictions.  Ask your health care provider about: ? Changing or stopping your regular medicines. This is especially important if you are taking diabetes medicines or blood thinners (anticoagulants). ? Taking medicines such as aspirin and ibuprofen. These medicines can thin your  blood. Do not take these medicines unless your health care provider tells you to take them. ? Taking over-the-counter medicines, vitamins, herbs, and supplements.  Wear clothing with sleeves that can be raised above the elbow.  Plan to have someone take you home from the hospital or clinic.  You may have a blood sample taken.  Your blood pressure, pulse rate, and breathing rate will be measured. What happens during the procedure?   To lower your risk of infection: ? Your health care team will wash or sanitize their hands. ? Your skin will be cleaned with an antiseptic.  You may be given a medicine to numb the area (local anesthetic).  A tourniquet will be placed on your arm.  A needle will be inserted into one of your veins.  Tubing and a collection bag will be attached to that needle.  Blood will flow through the needle and tubing into the collection bag.  The collection bag will be placed lower than your arm to allow gravity to help the flow of blood into the bag.  You may be asked to open and close your hand slowly and continually during the entire collection.  After the specified amount of blood has been removed from your body, the collection bag and tubing will be clamped.  The needle will be removed from your vein.  Pressure will be held on the site of the needle insertion to stop the bleeding.  A bandage (dressing) will be placed over the needle insertion site. The procedure may vary among health care providers and hospitals. What happens after the procedure?  Your blood pressure, pulse rate, and breathing rate will be   measured after the procedure.  You will be encouraged to drink fluids.  Your recovery will be assessed and monitored.  You can return to your normal activities as told by your health care provider. Summary  Therapeutic phlebotomy is the planned removal of blood from a person's body for the purpose of treating a medical condition.  Therapeutic  phlebotomy may be used to treat hemochromatosis, polycythemia vera, porphyria cutanea tarda, or sickle cell disease.  In the procedure, a needle is inserted and about a pint (470 mL, or 0.47 L) of blood is removed. The average adult has 9-12 pints (4.3-5.7 L) of blood in the body.  This is generally a safe procedure, but it can sometimes cause problems such as nausea, light-headedness, or low blood pressure (hypotension). This information is not intended to replace advice given to you by your health care provider. Make sure you discuss any questions you have with your health care provider. Document Released: 10/29/2010 Document Revised: 06/12/2017 Document Reviewed: 06/12/2017 Elsevier Patient Education  2020 Elsevier Inc.  

## 2019-04-01 ENCOUNTER — Other Ambulatory Visit: Payer: Self-pay

## 2019-04-01 ENCOUNTER — Inpatient Hospital Stay: Payer: No Typology Code available for payment source

## 2019-04-01 ENCOUNTER — Inpatient Hospital Stay: Payer: No Typology Code available for payment source | Attending: Oncology

## 2019-04-01 DIAGNOSIS — D45 Polycythemia vera: Secondary | ICD-10-CM | POA: Insufficient documentation

## 2019-04-01 LAB — CBC WITH DIFFERENTIAL (CANCER CENTER ONLY)
Abs Immature Granulocytes: 0.06 10*3/uL (ref 0.00–0.07)
Basophils Absolute: 0.3 10*3/uL — ABNORMAL HIGH (ref 0.0–0.1)
Basophils Relative: 3 %
Eosinophils Absolute: 0.6 10*3/uL — ABNORMAL HIGH (ref 0.0–0.5)
Eosinophils Relative: 6 %
HCT: 39.7 % (ref 36.0–46.0)
Hemoglobin: 11 g/dL — ABNORMAL LOW (ref 12.0–15.0)
Immature Granulocytes: 1 %
Lymphocytes Relative: 23 %
Lymphs Abs: 2.2 10*3/uL (ref 0.7–4.0)
MCH: 18.3 pg — ABNORMAL LOW (ref 26.0–34.0)
MCHC: 27.7 g/dL — ABNORMAL LOW (ref 30.0–36.0)
MCV: 66.2 fL — ABNORMAL LOW (ref 80.0–100.0)
Monocytes Absolute: 0.4 10*3/uL (ref 0.1–1.0)
Monocytes Relative: 5 %
Neutro Abs: 6 10*3/uL (ref 1.7–7.7)
Neutrophils Relative %: 62 %
Platelet Count: 712 10*3/uL — ABNORMAL HIGH (ref 150–400)
RBC: 6 MIL/uL — ABNORMAL HIGH (ref 3.87–5.11)
RDW: 22.5 % — ABNORMAL HIGH (ref 11.5–15.5)
WBC Count: 9.4 10*3/uL (ref 4.0–10.5)
nRBC: 0 % (ref 0.0–0.2)

## 2019-04-01 NOTE — Progress Notes (Signed)
Per Dr. [name], Pt does not need phlebotomy today with HCT 39.7.

## 2019-05-03 ENCOUNTER — Other Ambulatory Visit: Payer: Self-pay

## 2019-05-03 ENCOUNTER — Inpatient Hospital Stay: Payer: No Typology Code available for payment source

## 2019-05-03 ENCOUNTER — Inpatient Hospital Stay: Payer: No Typology Code available for payment source | Attending: Oncology

## 2019-05-03 VITALS — BP 105/77 | HR 81 | Temp 98.4°F | Resp 16

## 2019-05-03 DIAGNOSIS — D45 Polycythemia vera: Secondary | ICD-10-CM

## 2019-05-03 LAB — CBC WITH DIFFERENTIAL (CANCER CENTER ONLY)
Abs Immature Granulocytes: 0.06 10*3/uL (ref 0.00–0.07)
Basophils Absolute: 0.2 10*3/uL — ABNORMAL HIGH (ref 0.0–0.1)
Basophils Relative: 3 %
Eosinophils Absolute: 0.5 10*3/uL (ref 0.0–0.5)
Eosinophils Relative: 6 %
HCT: 41 % (ref 36.0–46.0)
Hemoglobin: 11.2 g/dL — ABNORMAL LOW (ref 12.0–15.0)
Immature Granulocytes: 1 %
Lymphocytes Relative: 20 %
Lymphs Abs: 1.6 10*3/uL (ref 0.7–4.0)
MCH: 18.1 pg — ABNORMAL LOW (ref 26.0–34.0)
MCHC: 27.3 g/dL — ABNORMAL LOW (ref 30.0–36.0)
MCV: 66.1 fL — ABNORMAL LOW (ref 80.0–100.0)
Monocytes Absolute: 0.5 10*3/uL (ref 0.1–1.0)
Monocytes Relative: 6 %
Neutro Abs: 5.2 10*3/uL (ref 1.7–7.7)
Neutrophils Relative %: 64 %
Platelet Count: 541 10*3/uL — ABNORMAL HIGH (ref 150–400)
RBC: 6.2 MIL/uL — ABNORMAL HIGH (ref 3.87–5.11)
RDW: 22.1 % — ABNORMAL HIGH (ref 11.5–15.5)
WBC Count: 8 10*3/uL (ref 4.0–10.5)
nRBC: 0 % (ref 0.0–0.2)

## 2019-05-03 NOTE — Patient Instructions (Signed)
Therapeutic Phlebotomy Therapeutic phlebotomy is the planned removal of blood from a person's body for the purpose of treating a medical condition. The procedure is similar to donating blood. Usually, about a pint (470 mL, or 0.47 L) of blood is removed. The average adult has 9-12 pints (4.3-5.7 L) of blood in the body. Therapeutic phlebotomy may be used to treat the following medical conditions:  Hemochromatosis. This is a condition in which the blood contains too much iron.  Polycythemia vera. This is a condition in which the blood contains too many red blood cells.  Porphyria cutanea tarda. This is a disease in which an important part of hemoglobin is not made properly. It results in the buildup of abnormal amounts of porphyrins in the body.  Sickle cell disease. This is a condition in which the red blood cells form an abnormal crescent shape rather than a round shape. Tell a health care provider about:  Any allergies you have.  All medicines you are taking, including vitamins, herbs, eye drops, creams, and over-the-counter medicines.  Any problems you or family members have had with anesthetic medicines.  Any blood disorders you have.  Any surgeries you have had.  Any medical conditions you have.  Whether you are pregnant or may be pregnant. What are the risks? Generally, this is a safe procedure. However, problems may occur, including:  Nausea or light-headedness.  Low blood pressure (hypotension).  Soreness, bleeding, swelling, or bruising at the needle insertion site.  Infection. What happens before the procedure?  Follow instructions from your health care provider about eating or drinking restrictions.  Ask your health care provider about: ? Changing or stopping your regular medicines. This is especially important if you are taking diabetes medicines or blood thinners (anticoagulants). ? Taking medicines such as aspirin and ibuprofen. These medicines can thin your  blood. Do not take these medicines unless your health care provider tells you to take them. ? Taking over-the-counter medicines, vitamins, herbs, and supplements.  Wear clothing with sleeves that can be raised above the elbow.  Plan to have someone take you home from the hospital or clinic.  You may have a blood sample taken.  Your blood pressure, pulse rate, and breathing rate will be measured. What happens during the procedure?   To lower your risk of infection: ? Your health care team will wash or sanitize their hands. ? Your skin will be cleaned with an antiseptic.  You may be given a medicine to numb the area (local anesthetic).  A tourniquet will be placed on your arm.  A needle will be inserted into one of your veins.  Tubing and a collection bag will be attached to that needle.  Blood will flow through the needle and tubing into the collection bag.  The collection bag will be placed lower than your arm to allow gravity to help the flow of blood into the bag.  You may be asked to open and close your hand slowly and continually during the entire collection.  After the specified amount of blood has been removed from your body, the collection bag and tubing will be clamped.  The needle will be removed from your vein.  Pressure will be held on the site of the needle insertion to stop the bleeding.  A bandage (dressing) will be placed over the needle insertion site. The procedure may vary among health care providers and hospitals. What happens after the procedure?  Your blood pressure, pulse rate, and breathing rate will be   measured after the procedure.  You will be encouraged to drink fluids.  Your recovery will be assessed and monitored.  You can return to your normal activities as told by your health care provider. Summary  Therapeutic phlebotomy is the planned removal of blood from a person's body for the purpose of treating a medical condition.  Therapeutic  phlebotomy may be used to treat hemochromatosis, polycythemia vera, porphyria cutanea tarda, or sickle cell disease.  In the procedure, a needle is inserted and about a pint (470 mL, or 0.47 L) of blood is removed. The average adult has 9-12 pints (4.3-5.7 L) of blood in the body.  This is generally a safe procedure, but it can sometimes cause problems such as nausea, light-headedness, or low blood pressure (hypotension). This information is not intended to replace advice given to you by your health care provider. Make sure you discuss any questions you have with your health care provider. Document Released: 10/29/2010 Document Revised: 06/12/2017 Document Reviewed: 06/12/2017 Elsevier Patient Education  2020 Elsevier Inc.  

## 2019-05-03 NOTE — Progress Notes (Signed)
Meredith Mody Marcantel presents today for phlebotomy per MD orders. Phlebotomy procedure started at 0952 and ended at 0959 in left [facility] with 16 gauge needle. 510 grams removed. Patient tolerated procedure well. IV needle removed intact. Snack and drink provided during 30 minute post observation period.   VSS.  Pt states feels fine for discharge.

## 2019-06-01 ENCOUNTER — Other Ambulatory Visit: Payer: Self-pay

## 2019-06-01 ENCOUNTER — Inpatient Hospital Stay: Payer: No Typology Code available for payment source

## 2019-06-01 ENCOUNTER — Inpatient Hospital Stay: Payer: No Typology Code available for payment source | Attending: Oncology

## 2019-06-01 DIAGNOSIS — D45 Polycythemia vera: Secondary | ICD-10-CM | POA: Diagnosis present

## 2019-06-01 LAB — CBC WITH DIFFERENTIAL (CANCER CENTER ONLY)
Abs Immature Granulocytes: 0.04 10*3/uL (ref 0.00–0.07)
Basophils Absolute: 0.2 10*3/uL — ABNORMAL HIGH (ref 0.0–0.1)
Basophils Relative: 2 %
Eosinophils Absolute: 0.5 10*3/uL (ref 0.0–0.5)
Eosinophils Relative: 7 %
HCT: 41 % (ref 36.0–46.0)
Hemoglobin: 11.1 g/dL — ABNORMAL LOW (ref 12.0–15.0)
Immature Granulocytes: 1 %
Lymphocytes Relative: 23 %
Lymphs Abs: 1.7 10*3/uL (ref 0.7–4.0)
MCH: 18 pg — ABNORMAL LOW (ref 26.0–34.0)
MCHC: 27.1 g/dL — ABNORMAL LOW (ref 30.0–36.0)
MCV: 66.3 fL — ABNORMAL LOW (ref 80.0–100.0)
Monocytes Absolute: 0.5 10*3/uL (ref 0.1–1.0)
Monocytes Relative: 7 %
Neutro Abs: 4.6 10*3/uL (ref 1.7–7.7)
Neutrophils Relative %: 60 %
Platelet Count: 585 10*3/uL — ABNORMAL HIGH (ref 150–400)
RBC: 6.18 MIL/uL — ABNORMAL HIGH (ref 3.87–5.11)
RDW: 22.5 % — ABNORMAL HIGH (ref 11.5–15.5)
WBC Count: 7.6 10*3/uL (ref 4.0–10.5)
nRBC: 0 % (ref 0.0–0.2)

## 2019-06-01 NOTE — Progress Notes (Signed)
Pt. Stated she felt good today. Labs within normal limits. No tx today, per Dr. [name].

## 2019-06-07 ENCOUNTER — Ambulatory Visit
Admission: EM | Admit: 2019-06-07 | Discharge: 2019-06-07 | Disposition: A | Discharge status: Home / Self Care | Payer: No Typology Code available for payment source | Attending: Emergency Medicine | Admitting: Emergency Medicine

## 2019-06-07 ENCOUNTER — Other Ambulatory Visit: Payer: Self-pay

## 2019-06-07 ENCOUNTER — Encounter: Payer: Self-pay | Admitting: Emergency Medicine

## 2019-06-07 DIAGNOSIS — R5383 Other fatigue: Secondary | ICD-10-CM | POA: Diagnosis not present

## 2019-06-07 DIAGNOSIS — Z20828 Contact with and (suspected) exposure to other viral communicable diseases: Secondary | ICD-10-CM | POA: Diagnosis not present

## 2019-06-07 DIAGNOSIS — R062 Wheezing: Secondary | ICD-10-CM

## 2019-06-07 DIAGNOSIS — Z20822 Contact with and (suspected) exposure to covid-19: Secondary | ICD-10-CM

## 2019-06-07 DIAGNOSIS — R05 Cough: Secondary | ICD-10-CM | POA: Diagnosis not present

## 2019-06-07 DIAGNOSIS — R059 Cough, unspecified: Secondary | ICD-10-CM

## 2019-06-07 MED ORDER — LORATADINE 10 MG PO TABS: 10.0000 mg | ORAL_TABLET | Freq: Every day | ORAL | 0 refills | Status: DC

## 2019-06-07 MED ORDER — PREDNISONE 20 MG PO TABS: 20.0000 mg | ORAL_TABLET | Freq: Every day | ORAL | 0 refills | Status: AC

## 2019-06-07 MED ORDER — FLUTICASONE PROPIONATE 50 MCG/ACT NA SUSP: 1.0000 | Freq: Every day | NASAL | 0 refills | Status: DC

## 2019-06-07 MED ORDER — ALBUTEROL SULFATE HFA 108 (90 BASE) MCG/ACT IN AERS: 2.0000 | INHALATION_SPRAY | RESPIRATORY_TRACT | 0 refills | Status: DC | PRN

## 2019-06-07 NOTE — ED Notes (Signed)
Patient able to ambulate independently  

## 2019-06-07 NOTE — ED Triage Notes (Signed)
Pt presents to [facility] for assessment of fatigue, cough, nasal congestion, general malaise, sore throat.  Son tested positive for COVID today.

## 2019-06-07 NOTE — Discharge Instructions (Addendum)
Your COVID test is pending - it is important to quarantine / isolate at home until your results are back. °If you test positive and would like further evaluation for persistent or worsening symptoms, you may schedule an E-visit or virtual (video) visit throughout the [facility] MyChart app or website. ° °PLEASE NOTE: If you develop severe chest pain or shortness of breath please go to the ER or call 9-1-1 for further evaluation --> DO NOT schedule electronic or virtual visits for this. °Please call our office for further guidance / recommendations as needed. °

## 2019-06-07 NOTE — ED Provider Notes (Signed)
[facility]    CSN: [id number] Arrival date & time: 06/07/19  1339      History   Chief Complaint Chief Complaint  Patient presents with  . COVId Exposure    HPI Eileen [name] is a 48 y.o. female   Presenting for Covid testing: Exposure: son who tested positive Wednesday.   Date of exposure: last known on fri/sat Any fever, symptoms since exposure: yes - fatigue, dyspnea, nasal congestion, scratchy throat.  Tried otc cold & flu w/o significant relief.   Past Medical History:  Diagnosis Date  . Acute duodenal ulcer without mention of hemorrhage, perforation, or obstruction   . Dysrhythmia    History of palpations, for many years  . GERD (gastroesophageal reflux disease)   . Headache    takes Advil  . Reflux esophagitis     Patient Active Problem List   Diagnosis Date Noted  . 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 Polycythemia vera ([place]) 02/20/2016  . MORBID OBESITY 08/11/2007  . ANEMIA DUE TO CHRONIC BLOOD LOSS 08/11/2007  . ESOPHAGITIS 08/11/2007  . GERD 08/11/2007  . Headache 08/11/2007  . Personal history of peptic ulcer disease 08/11/2007    Past Surgical History:  Procedure Laterality Date  . ABDOMINAL HYSTERECTOMY  2011ish  . CESAREAN SECTION     x 3   . ORIF ANKLE FRACTURE Left 12/21/2014   Procedure: OPEN REDUCTION INTERNAL FIXATION (ORIF) LEFT LATERAL MALLEOLUS ANKLE FRACTURE;  Surgeon: Leandrew Koyanagi, MD;  Location: [facility];  Service: Orthopedics;  Laterality: Left;  Needs RNFA  . UPPER GI ENDOSCOPY      OB History   No obstetric history on file.      Home Medications    Prior to Admission medications   Medication Sig Start Date End Date Taking? Authorizing Provider  albuterol (VENTOLIN HFA) 108 (90 Base) MCG/ACT inhaler Inhale 2 puffs into the lungs every 4 (four) hours as needed for wheezing or shortness of breath. 06/07/19   Hall-Potvin, [place], PA-C  aspirin EC 325 MG tablet  Take 1 tablet (325 mg total) by mouth daily. 01/29/17   Maryanna Shape, NP  butalbital-aspirin-caffeine [facility]) [phone] MG capsule Take 1 capsule by mouth 2 (two) times daily as needed for headache.    [provider]  Diclofenac Potassium 50 MG PACK Take 50 mg by mouth once as needed. For migraine. 04/30/17   Melvenia Beam, MD  fluticasone (FLONASE) 50 MCG/ACT nasal spray Place 1 spray into both nostrils daily. 06/07/19   Hall-Potvin, [place], PA-C  loratadine (CLARITIN) 10 MG tablet Take 1 tablet (10 mg total) by mouth daily. 06/07/19   Hall-Potvin, [place], PA-C  predniSONE (DELTASONE) 20 MG tablet Take 1 tablet (20 mg total) by mouth daily for 7 days. 06/07/19 06/14/19  Hall-Potvin, [place], PA-C  prochlorperazine (COMPAZINE) 5 MG tablet Take 1 tablet (5 mg total) by mouth every 8 (eight) hours as needed for nausea. 11/28/16   Melvenia Beam, MD  PROPRANOLOL HCL PO Take by mouth.    [provider]  rizatriptan (MAXALT) 10 MG tablet Take 1 tablet (10 mg total) by mouth as needed for migraine. May repeat in 2 hours if needed 04/30/17   Melvenia Beam, MD  Topiramate ER (QUDEXY XR) 150 MG CS24 sprinkle capsule Take 150 mg by mouth at bedtime. 04/30/17   Melvenia Beam, MD  traMADol (ULTRAM) 50 MG tablet Take 1 tablet (50 mg total) by mouth every 6 (  six) hours as needed for moderate pain. 01/29/17   Maryanna Shape, NP    Family History Family History  Problem Relation Age of Onset  . Migraines Mother   . Breast cancer Other        [place]   . Colon cancer Neg Hx     Social History Social History   Tobacco Use  . Smoking status: Never Smoker  . Smokeless tobacco: Never Used  Substance Use Topics  . Alcohol use: No  . Drug use: No     Allergies   Patient has no known allergies.   Review of Systems Review of Systems  Constitutional: Positive for appetite change and fatigue. Negative for activity change and fever.  HENT: Positive for  congestion and sore throat. Negative for dental problem, ear pain, facial swelling, hearing loss, sinus pain, trouble swallowing and voice change.   Eyes: Negative for photophobia, pain and visual disturbance.  Respiratory: Negative for cough, choking, shortness of breath, wheezing and stridor.   Cardiovascular: Negative for chest pain and palpitations.  Gastrointestinal: Negative for diarrhea and vomiting.  Musculoskeletal: Negative for arthralgias and myalgias.  Neurological: Negative for dizziness and headaches.     Physical Exam Triage Vital Signs ED Triage Vitals [06/07/19 1442]  Enc Vitals Group     BP 126/85     Pulse Rate 72     Resp 18     Temp 97.8 F (36.6 C)     Temp Source Temporal     SpO2 98 %     Weight      Height      Head Circumference      Peak Flow      Pain Score 0     Pain Loc      Pain Edu?      Excl. in [place]?    No data found.  Updated Vital Signs BP 126/85 (BP Location: Left Arm)   Pulse 72   Temp 97.8 F (36.6 C) (Temporal)   Resp 18   SpO2 98%   Visual Acuity Right Eye Distance:   Left Eye Distance:   Bilateral Distance:    Right Eye Near:   Left Eye Near:    Bilateral Near:     Physical Exam Constitutional:      General: She is not in acute distress. HENT:     Head: Normocephalic and atraumatic.  Eyes:     General: No scleral icterus.    Pupils: Pupils are equal, round, and reactive to light.  Cardiovascular:     Rate and Rhythm: Normal rate.  Pulmonary:     Effort: Pulmonary effort is normal. No respiratory distress.     Breath sounds: No stridor. Wheezing present. No rhonchi or rales.     Comments: Diffuse w/o prolonged expiratory phase Skin:    Capillary Refill: Capillary refill takes less than 2 seconds.     Coloration: Skin is not jaundiced or pale.  Neurological:     General: No focal deficit present.     Mental Status: She is alert and oriented to person, place, and time.      UC Treatments / Results   Labs (all labs ordered are listed, but only abnormal results are displayed) Labs Reviewed  NOVEL CORONAVIRUS, NAA   Narrative:    Performed at:  [address], [place], [place]  [id number] Lab Director: Rush Farmer MD, Phone:  [phone]    EKG   Radiology No  results found.  Procedures Procedures (including critical care time)  Medications Ordered in UC Medications - No data to display  Initial Impression / Assessment and Plan / UC Course  I have reviewed the triage vital signs and the nursing notes.  Pertinent labs & imaging results that were available during my care of the patient were reviewed by me and considered in my medical decision making (see chart for details).     Patient afebrile, nontoxic, with SpO2 98%.  Covid PCR pending.  Patient to quarantine until results are back.  We will continue supportive management and add prednisone & albuterol inhlr as adjuvant due to lung exam: possible reactive airway vs bronchitis.  Return precautions discussed, patient verbalized understanding and is agreeable to plan. Final Clinical Impressions(s) / UC Diagnoses   Final diagnoses:  Fatigue, unspecified type  Exposure to COVID-19 virus  Cough     Discharge Instructions     Your COVID test is pending - it is important to quarantine / isolate at home until your results are back. If you test positive and would like further evaluation for persistent or worsening symptoms, you may schedule an E-visit or virtual (video) visit throughout the [facility] app or website.  PLEASE NOTE: If you develop severe chest pain or shortness of breath please go to the ER or call 9-1-1 for further evaluation --> DO NOT schedule electronic or virtual visits for this. Please call our office for further guidance / recommendations as needed.    ED Prescriptions    Medication Sig Dispense Auth. Provider   predniSONE (DELTASONE) 20 MG tablet Take 1 tablet (20  mg total) by mouth daily for 7 days. 7 tablet Hall-Potvin, [place], PA-C   loratadine (CLARITIN) 10 MG tablet Take 1 tablet (10 mg total) by mouth daily. 30 tablet Hall-Potvin, [place], PA-C   fluticasone (FLONASE) 50 MCG/ACT nasal spray Place 1 spray into both nostrils daily. 16 g Hall-Potvin, [place], PA-C   albuterol (VENTOLIN HFA) 108 (90 Base) MCG/ACT inhaler Inhale 2 puffs into the lungs every 4 (four) hours as needed for wheezing or shortness of breath. 18 g Hall-Potvin, [place], PA-C     PDMP not reviewed this encounter.   Hall-Potvin, [place], [place] 06/09/19 1619

## 2019-06-09 LAB — NOVEL CORONAVIRUS, NAA: SARS-CoV-2, NAA: NOT DETECTED

## 2019-06-10 ENCOUNTER — Other Ambulatory Visit: Payer: Self-pay

## 2019-06-10 ENCOUNTER — Emergency Department (HOSPITAL_COMMUNITY)
Admission: EM | Admit: 2019-06-10 | Discharge: 2019-06-11 | Disposition: A | Discharge status: Home / Self Care | Payer: No Typology Code available for payment source | Attending: Emergency Medicine | Admitting: Emergency Medicine

## 2019-06-10 ENCOUNTER — Encounter (HOSPITAL_COMMUNITY): Payer: Self-pay | Admitting: Emergency Medicine

## 2019-06-10 DIAGNOSIS — E669 Obesity, unspecified: Secondary | ICD-10-CM | POA: Insufficient documentation

## 2019-06-10 DIAGNOSIS — Z7982 Long term (current) use of aspirin: Secondary | ICD-10-CM | POA: Insufficient documentation

## 2019-06-10 DIAGNOSIS — M7918 Myalgia, other site: Secondary | ICD-10-CM | POA: Diagnosis not present

## 2019-06-10 DIAGNOSIS — J069 Acute upper respiratory infection, unspecified: Secondary | ICD-10-CM | POA: Insufficient documentation

## 2019-06-10 DIAGNOSIS — Z20828 Contact with and (suspected) exposure to other viral communicable diseases: Secondary | ICD-10-CM | POA: Insufficient documentation

## 2019-06-10 DIAGNOSIS — Z20822 Contact with and (suspected) exposure to covid-19: Secondary | ICD-10-CM

## 2019-06-10 DIAGNOSIS — R5383 Other fatigue: Secondary | ICD-10-CM | POA: Diagnosis not present

## 2019-06-10 DIAGNOSIS — R05 Cough: Secondary | ICD-10-CM | POA: Diagnosis not present

## 2019-06-10 DIAGNOSIS — R0602 Shortness of breath: Secondary | ICD-10-CM | POA: Diagnosis present

## 2019-06-10 DIAGNOSIS — Z6834 Body mass index (BMI) 34.0-34.9, adult: Secondary | ICD-10-CM | POA: Diagnosis not present

## 2019-06-10 LAB — COMPREHENSIVE METABOLIC PANEL
ALT: 12 U/L (ref 0–44)
AST: 15 U/L (ref 15–41)
Albumin: 4.1 g/dL (ref 3.5–5.0)
Alkaline Phosphatase: 80 U/L (ref 38–126)
Anion gap: 7 (ref 5–15)
BUN: 12 mg/dL (ref 6–20)
CO2: 26 mmol/L (ref 22–32)
Calcium: 9.1 mg/dL (ref 8.9–10.3)
Chloride: 106 mmol/L (ref 98–111)
Creatinine, Ser: 0.84 mg/dL (ref 0.44–1.00)
GFR calc Af Amer: >60 mL/min (ref >60)
GFR calc non Af Amer: >60 mL/min (ref >60)
Glucose, Bld: 136 mg/dL — ABNORMAL HIGH (ref 70–99)
Potassium: 3.9 mmol/L (ref 3.5–5.1)
Sodium: 139 mmol/L (ref 135–145)
Total Bilirubin: 0.3 mg/dL (ref 0.3–1.2)
Total Protein: 7.7 g/dL (ref 6.5–8.1)

## 2019-06-10 LAB — URINALYSIS, ROUTINE W REFLEX MICROSCOPIC
Bilirubin Urine: NEGATIVE
Glucose, UA: NEGATIVE mg/dL
Hgb urine dipstick: NEGATIVE
Ketones, ur: NEGATIVE mg/dL
Leukocytes,Ua: NEGATIVE
Nitrite: NEGATIVE
Protein, ur: NEGATIVE mg/dL
Specific Gravity, Urine: 1.015 (ref 1.005–1.030)
pH: 5 (ref 5.0–8.0)

## 2019-06-10 LAB — CBC WITH DIFFERENTIAL/PLATELET
Abs Immature Granulocytes: 0.08 10*3/uL — ABNORMAL HIGH (ref 0.00–0.07)
Basophils Absolute: 0.1 10*3/uL (ref 0.0–0.1)
Basophils Relative: 1 %
Eosinophils Absolute: 0.1 10*3/uL (ref 0.0–0.5)
Eosinophils Relative: 1 %
HCT: 44.4 % (ref 36.0–46.0)
Hemoglobin: 11.9 g/dL — ABNORMAL LOW (ref 12.0–15.0)
Immature Granulocytes: 1 %
Lymphocytes Relative: 15 %
Lymphs Abs: 1.1 10*3/uL (ref 0.7–4.0)
MCH: 18 pg — ABNORMAL LOW (ref 26.0–34.0)
MCHC: 26.8 g/dL — ABNORMAL LOW (ref 30.0–36.0)
MCV: 67.1 fL — ABNORMAL LOW (ref 80.0–100.0)
Monocytes Absolute: 0.6 10*3/uL (ref 0.1–1.0)
Monocytes Relative: 8 %
Neutro Abs: 5.4 10*3/uL (ref 1.7–7.7)
Neutrophils Relative %: 74 %
Platelets: 598 10*3/uL — ABNORMAL HIGH (ref 150–400)
RBC: 6.62 MIL/uL — ABNORMAL HIGH (ref 3.87–5.11)
RDW: 23.2 % — ABNORMAL HIGH (ref 11.5–15.5)
WBC: 7.2 10*3/uL (ref 4.0–10.5)
nRBC: 0 % (ref 0.0–0.2)

## 2019-06-10 LAB — I-STAT BETA HCG BLOOD, ED (MC, WL, AP ONLY): I-stat hCG, quantitative: 7.4 m[IU]/mL — ABNORMAL HIGH (ref <5)

## 2019-06-10 LAB — LACTIC ACID, PLASMA: Lactic Acid, Venous: 1.8 mmol/L (ref 0.5–1.9)

## 2019-06-10 MED ORDER — SODIUM CHLORIDE 0.9% FLUSH: 3.0000 mL | Freq: Once | INTRAVENOUS | Status: DC

## 2019-06-10 NOTE — ED Triage Notes (Signed)
Pt reports she has felt "bad" for a week.  Pressure in chest and center of back, SOB, sore throat, headache.  Son has COVID, she tested negative on Sunday.

## 2019-06-11 ENCOUNTER — Emergency Department (HOSPITAL_COMMUNITY): Payer: No Typology Code available for payment source

## 2019-06-11 NOTE — ED Provider Notes (Signed)
[facility] EMERGENCY DEPARTMENT Provider Note   CSN: [id number] Arrival date & time: 06/10/19  1834     History Chief Complaint  Patient presents with  . COVID symptoms    Eileen [name] is a 49 y.o. female.  Patient is a 49 year old female with a history of polycythemia vera and reflux esophagitis who is presenting today with cough, congestion, fatigue, myalgias, sore throat.  This started approximately 6 days ago.  In the last few days she has felt very winded with activity.  She denies any resting shortness of breath.  She has no smoking history or asthma history.  No history of chronic shortness of breath.  Patient's son who she has significant contact with tested positive for Covid on Monday.  Her symptoms started after his.  Her PCR test did come back negative.  She has no unilateral leg pain or swelling.  She takes no OCPs or hormones.  No prior history of DVT or PE.  No history of cardiac disease and she denies any weight gain or swelling.  The history is provided by the patient.  URI Presenting symptoms: congestion, cough, fatigue and sore throat   Severity:  Moderate Onset quality:  Gradual Duration:  6 days Timing:  Constant Progression:  Worsening Chronicity:  New Relieved by:  Nothing Worsened by:  Movement Ineffective treatments:  None tried Associated symptoms: myalgias   Associated symptoms comment:  SOB. Cough.   Risk factors: sick contacts        Past Medical History:  Diagnosis Date  . Acute duodenal ulcer without mention of hemorrhage, perforation, or obstruction   . Dysrhythmia    History of palpations, for many years  . GERD (gastroesophageal reflux disease)   . Headache    takes Advil  . Reflux esophagitis     Patient Active Problem List   Diagnosis Date Noted  . 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 Polycythemia vera ([place]) 02/20/2016  . MORBID OBESITY 08/11/2007  . ANEMIA DUE  TO CHRONIC BLOOD LOSS 08/11/2007  . ESOPHAGITIS 08/11/2007  . GERD 08/11/2007  . Headache 08/11/2007  . Personal history of peptic ulcer disease 08/11/2007    Past Surgical History:  Procedure Laterality Date  . ABDOMINAL HYSTERECTOMY  2011ish  . CESAREAN SECTION     x 3   . ORIF ANKLE FRACTURE Left 12/21/2014   Procedure: OPEN REDUCTION INTERNAL FIXATION (ORIF) LEFT LATERAL MALLEOLUS ANKLE FRACTURE;  Surgeon: Leandrew Koyanagi, MD;  Location: [facility];  Service: Orthopedics;  Laterality: Left;  Needs RNFA  . UPPER GI ENDOSCOPY       OB History   No obstetric history on file.     Family History  Problem Relation Age of Onset  . Migraines Mother   . Breast cancer Other        Great [place]   . Colon cancer Neg Hx     Social History   Tobacco Use  . Smoking status: Never Smoker  . Smokeless tobacco: Never Used  Substance Use Topics  . Alcohol use: No  . Drug use: No    Home Medications Prior to Admission medications   Medication Sig Start Date End Date Taking? Authorizing Provider  albuterol (VENTOLIN HFA) 108 (90 Base) MCG/ACT inhaler Inhale 2 puffs into the lungs every 4 (four) hours as needed for wheezing or shortness of breath. 06/07/19   Hall-Potvin, [place], PA-C  aspirin EC 325 MG tablet Take 1 tablet (325 mg  total) by mouth daily. 01/29/17   Maryanna Shape, NP  butalbital-aspirin-caffeine [facility]) [phone] MG capsule Take 1 capsule by mouth 2 (two) times daily as needed for headache.    [provider]  Diclofenac Potassium 50 MG PACK Take 50 mg by mouth once as needed. For migraine. 04/30/17   Melvenia Beam, MD  fluticasone (FLONASE) 50 MCG/ACT nasal spray Place 1 spray into both nostrils daily. 06/07/19   Hall-Potvin, [place], PA-C  loratadine (CLARITIN) 10 MG tablet Take 1 tablet (10 mg total) by mouth daily. 06/07/19   Hall-Potvin, [place], PA-C  predniSONE (DELTASONE) 20 MG tablet Take 1 tablet (20 mg total) by mouth daily for 7 days. 06/07/19 06/14/19   Hall-Potvin, [place], PA-C  prochlorperazine (COMPAZINE) 5 MG tablet Take 1 tablet (5 mg total) by mouth every 8 (eight) hours as needed for nausea. 11/28/16   Melvenia Beam, MD  PROPRANOLOL HCL PO Take by mouth.    [provider]  rizatriptan (MAXALT) 10 MG tablet Take 1 tablet (10 mg total) by mouth as needed for migraine. May repeat in 2 hours if needed 04/30/17   Melvenia Beam, MD  Topiramate ER (QUDEXY XR) 150 MG CS24 sprinkle capsule Take 150 mg by mouth at bedtime. 04/30/17   Melvenia Beam, MD  traMADol (ULTRAM) 50 MG tablet Take 1 tablet (50 mg total) by mouth every 6 (six) hours as needed for moderate pain. 01/29/17   Maryanna Shape, NP    Allergies    Patient has no known allergies.  Review of Systems   Review of Systems  Constitutional: Positive for fatigue.  HENT: Positive for congestion and sore throat.   Respiratory: Positive for cough.   Musculoskeletal: Positive for myalgias.  All other systems reviewed and are negative.   Physical Exam Updated Vital Signs BP 130/80 (BP Location: Right Arm)   Pulse 73   Temp 97.8 F (36.6 C) (Oral)   Resp 16   Ht 5\' 3"  (1.6 m)   Wt 88.5 kg   SpO2 100%   BMI 34.54 kg/m   Physical Exam Vitals and nursing note reviewed.  Constitutional:      General: She is not in acute distress.    Appearance: She is well-developed. She is obese.  HENT:     Head: Normocephalic and atraumatic.     Right Ear: Tympanic membrane normal.     Left Ear: Tympanic membrane normal.     Mouth/Throat:     Pharynx: Posterior oropharyngeal erythema present. No oropharyngeal exudate.  Eyes:     Pupils: Pupils are equal, round, and reactive to light.  Cardiovascular:     Rate and Rhythm: Normal rate and regular rhythm.     Heart sounds: Normal heart sounds. No murmur. No friction rub.  Pulmonary:     Effort: Pulmonary effort is normal.     Breath sounds: Normal breath sounds. No wheezing or rales.  Abdominal:     General: Bowel  sounds are normal. There is no distension.     Palpations: Abdomen is soft.     Tenderness: There is no abdominal tenderness. There is no guarding or rebound.  Musculoskeletal:        General: No tenderness. Normal range of motion.     Right lower leg: No edema.     Left lower leg: No edema.     Comments: No edema  Skin:    General: Skin is warm and dry.     Findings: No  rash.  Neurological:     General: No focal deficit present.     Mental Status: She is alert and oriented to person, place, and time.     Cranial Nerves: No cranial nerve deficit.  Psychiatric:        Mood and Affect: Mood normal.        Behavior: Behavior normal.        Thought Content: Thought content normal.     ED Results / Procedures / Treatments   Labs (all labs ordered are listed, but only abnormal results are displayed) Labs Reviewed  COMPREHENSIVE METABOLIC PANEL - Abnormal; Notable for the following components:      Result Value   Glucose, Bld 136 (*)    All other components within normal limits  CBC WITH DIFFERENTIAL/PLATELET - Abnormal; Notable for the following components:   RBC 6.62 (*)    Hemoglobin 11.9 (*)    MCV 67.1 (*)    MCH 18.0 (*)    MCHC 26.8 (*)    RDW 23.2 (*)    Platelets 598 (*)    Abs Immature Granulocytes 0.08 (*)    All other components within normal limits  I-STAT BETA HCG BLOOD, ED ([facility], [facility], [facility] ONLY) - Abnormal; Notable for the following components:   I-stat hCG, quantitative 7.4 (*)    All other components within normal limits  LACTIC ACID, PLASMA  URINALYSIS, ROUTINE W REFLEX MICROSCOPIC  LACTIC ACID, PLASMA    EKG EKG Interpretation  Date/Time:  Thursday June 10 2019 19:18:50 EST Ventricular Rate:  102 PR Interval:  138 QRS Duration: 96 QT Interval:  344 QTC Calculation: 448 R Axis:   44 Text Interpretation: Sinus tachycardia Incomplete right bundle branch block No significant change since last tracing Confirmed by Blanchie Dessert [id number]) on 06/11/2019  7:52:01 AM   Radiology DG Chest Portable 1 View  Result Date: 06/11/2019 CLINICAL DATA:  Shortness of breath and chest pain EXAM: PORTABLE CHEST 1 VIEW COMPARISON:  03/29/2009 FINDINGS: The heart size and mediastinal contours are within normal limits. Both lungs are clear. The visualized skeletal structures are unremarkable. IMPRESSION: No active disease. Electronically Signed   By: Donavan Foil M.D.   On: 06/11/2019 01:43    Procedures Procedures (including critical care time)  Medications Ordered in ED Medications  sodium chloride flush (NS) 0.9 % injection 3 mL (has no administration in time range)    ED Course  I have reviewed the triage vital signs and the nursing notes.  Pertinent labs & imaging results that were available during my care of the patient were reviewed by me and considered in my medical decision making (see chart for details).    MDM Rules/Calculators/A&P                      Patient presenting with URI type symptoms today which could be Covid or another viral illness.  Patient's exam is reassuring with overall mostly clear breath sounds, normal vital signs and oxygen saturation of 100% on room air.  Patient's son did test positive for Covid and she does been significant time with him.  Suspect this is most likely Covid and she had a negative PCR test however her daughter also has cold-like symptoms and this could be another viral etiology.  Patient has not had fever so doubt influenza.  Low suspicion for PE, MI, CHF.  There is no wheezing on exam to suggest bronchitis.  She has no facial pain or symptoms consistent  with sinusitis.  Jillean Liva Schomburg was evaluated in Emergency Department on 06/11/2019 for the symptoms described in the history of present illness. She was evaluated in the context of the global COVID-19 pandemic, which necessitated consideration that the patient might be at risk for infection with the SARS-CoV-2 virus that causes COVID-19. Institutional  protocols and algorithms that pertain to the evaluation of patients at risk for COVID-19 are in a state of rapid change based on information released by regulatory bodies including the CDC and federal and state organizations. These policies and algorithms were followed during the patient's care in the ED.  Final Clinical Impression(s) / ED Diagnoses Final diagnoses:  Suspected COVID-19 virus infection  Viral upper respiratory tract infection    Rx / DC Orders ED Discharge Orders    None       Blanchie Dessert, MD 06/11/19 [phone]

## 2019-06-11 NOTE — ED Notes (Signed)
ED Provider at bedside. 

## 2019-06-11 NOTE — Discharge Instructions (Signed)
With Covid it is common to feel short of breath and winded when doing activity.  You may have to rest more often and make sure you are drinking plenty of fluids.  If the shortness of breath becomes more severe and you cannot catch her breath even at rest you need to return to the emergency room, go to your doctor or at least get a pulse ox to make sure your oxygen levels are greater than 90%.

## 2019-07-02 ENCOUNTER — Other Ambulatory Visit: Payer: Self-pay

## 2019-07-02 ENCOUNTER — Inpatient Hospital Stay: Payer: No Typology Code available for payment source

## 2019-07-02 ENCOUNTER — Inpatient Hospital Stay: Payer: No Typology Code available for payment source | Attending: Oncology

## 2019-07-02 DIAGNOSIS — D45 Polycythemia vera: Secondary | ICD-10-CM | POA: Insufficient documentation

## 2019-07-02 LAB — CBC WITH DIFFERENTIAL (CANCER CENTER ONLY)
Abs Immature Granulocytes: 0.05 10*3/uL (ref 0.00–0.07)
Basophils Absolute: 0.2 10*3/uL — ABNORMAL HIGH (ref 0.0–0.1)
Basophils Relative: 2 %
Eosinophils Absolute: 0.3 10*3/uL (ref 0.0–0.5)
Eosinophils Relative: 4 %
HCT: 42.1 % (ref 36.0–46.0)
Hemoglobin: 11.6 g/dL — ABNORMAL LOW (ref 12.0–15.0)
Immature Granulocytes: 1 %
Lymphocytes Relative: 18 %
Lymphs Abs: 1.3 10*3/uL (ref 0.7–4.0)
MCH: 18 pg — ABNORMAL LOW (ref 26.0–34.0)
MCHC: 27.6 g/dL — ABNORMAL LOW (ref 30.0–36.0)
MCV: 65.4 fL — ABNORMAL LOW (ref 80.0–100.0)
Monocytes Absolute: 0.4 10*3/uL (ref 0.1–1.0)
Monocytes Relative: 6 %
Neutro Abs: 5 10*3/uL (ref 1.7–7.7)
Neutrophils Relative %: 69 %
Platelet Count: 854 10*3/uL — ABNORMAL HIGH (ref 150–400)
RBC: 6.44 MIL/uL — ABNORMAL HIGH (ref 3.87–5.11)
RDW: 23.7 % — ABNORMAL HIGH (ref 11.5–15.5)
WBC Count: 7.3 10*3/uL (ref 4.0–10.5)
nRBC: 0 % (ref 0.0–0.2)

## 2019-07-02 NOTE — Progress Notes (Signed)
Hematocrit today was 42.1. Per Dr. [name] no phlebotomy needed today. Patient verbalized understanding and sent home.

## 2019-08-03 ENCOUNTER — Other Ambulatory Visit: Payer: Self-pay

## 2019-08-03 ENCOUNTER — Inpatient Hospital Stay: Payer: No Typology Code available for payment source | Attending: Oncology | Admitting: Oncology

## 2019-08-03 ENCOUNTER — Inpatient Hospital Stay: Payer: No Typology Code available for payment source

## 2019-08-03 VITALS — BP 119/71 | HR 86 | Temp 98.5°F | Resp 18 | Ht 63.0 in | Wt 216.4 lb

## 2019-08-03 DIAGNOSIS — Z7982 Long term (current) use of aspirin: Secondary | ICD-10-CM | POA: Diagnosis not present

## 2019-08-03 DIAGNOSIS — E611 Iron deficiency: Secondary | ICD-10-CM | POA: Insufficient documentation

## 2019-08-03 DIAGNOSIS — G43909 Migraine, unspecified, not intractable, without status migrainosus: Secondary | ICD-10-CM | POA: Diagnosis not present

## 2019-08-03 DIAGNOSIS — D45 Polycythemia vera: Secondary | ICD-10-CM | POA: Insufficient documentation

## 2019-08-03 DIAGNOSIS — D500 Iron deficiency anemia secondary to blood loss (chronic): Secondary | ICD-10-CM

## 2019-08-03 LAB — CBC WITH DIFFERENTIAL (CANCER CENTER ONLY)
Abs Immature Granulocytes: 0.04 10*3/uL (ref 0.00–0.07)
Basophils Absolute: 0.2 10*3/uL — ABNORMAL HIGH (ref 0.0–0.1)
Basophils Relative: 3 %
Eosinophils Absolute: 0.6 10*3/uL — ABNORMAL HIGH (ref 0.0–0.5)
Eosinophils Relative: 8 %
HCT: 42.9 % (ref 36.0–46.0)
Hemoglobin: 11.7 g/dL — ABNORMAL LOW (ref 12.0–15.0)
Immature Granulocytes: 1 %
Lymphocytes Relative: 23 %
Lymphs Abs: 1.7 10*3/uL (ref 0.7–4.0)
MCH: 18.1 pg — ABNORMAL LOW (ref 26.0–34.0)
MCHC: 27.3 g/dL — ABNORMAL LOW (ref 30.0–36.0)
MCV: 66.2 fL — ABNORMAL LOW (ref 80.0–100.0)
Monocytes Absolute: 0.4 10*3/uL (ref 0.1–1.0)
Monocytes Relative: 6 %
Neutro Abs: 4.4 10*3/uL (ref 1.7–7.7)
Neutrophils Relative %: 59 %
Platelet Count: 541 10*3/uL — ABNORMAL HIGH (ref 150–400)
RBC: 6.48 MIL/uL — ABNORMAL HIGH (ref 3.87–5.11)
RDW: 22.8 % — ABNORMAL HIGH (ref 11.5–15.5)
WBC Count: 7.3 10*3/uL (ref 4.0–10.5)
nRBC: 0 % (ref 0.0–0.2)

## 2019-08-03 NOTE — Progress Notes (Signed)
Hematology and Oncology Follow Up Visit  Eileen [name] [id number] March 26, 1971 49 y.o. 08/03/2019 9:26 AM Darron Doom [name], MDElkins, Curt Jews, *   Principle Diagnosis: 49 year old woman with JAK2 positive polycythemia vera diagnosed in 2017.     Current therapy: Aspirin 325 mg daily.  Therapeutic phlebotomy every 4 weeks to keep her hematocrit less than 45.    Interim History: Eileen [name] is here for return evaluation.  Since the last visit, she reports no major changes in her health.  She has been started on new migraine medication with weekly injection which has decreased her migraine frequency as well as breakthrough episodes.  She does report some occasional arthralgias associated with her polycythemia but overall she denies any chest pain or shortness of breath.  She denies any dyspnea on exertion.  Her performance status and quality of life remained reasonable.     Medications: Reviewed and updated. Current Outpatient Medications  Medication Sig Dispense Refill  . albuterol (VENTOLIN HFA) 108 (90 Base) MCG/ACT inhaler Inhale 2 puffs into the lungs every 4 (four) hours as needed for wheezing or shortness of breath. (Patient not taking: Reported on 08/03/2019) 18 g 0  . aspirin EC 325 MG tablet Take 1 tablet (325 mg total) by mouth daily. (Patient not taking: Reported on 08/03/2019) 30 tablet 3  . butalbital-aspirin-caffeine (FIORINAL) 50-325-40 MG capsule Take 1 capsule by mouth 2 (two) times daily as needed for headache.    . Diclofenac Potassium 50 MG PACK Take 50 mg by mouth once as needed. For migraine. (Patient not taking: Reported on 08/03/2019) 9 each 11  . fluticasone (FLONASE) 50 MCG/ACT nasal spray Place 1 spray into both nostrils daily. (Patient not taking: Reported on 08/03/2019) 16 g 0  . loratadine (CLARITIN) 10 MG tablet Take 1 tablet (10 mg total) by mouth daily. (Patient not taking: Reported on 08/03/2019) 30 tablet 0  . prochlorperazine (COMPAZINE) 5 MG tablet  Take 1 tablet (5 mg total) by mouth every 8 (eight) hours as needed for nausea. (Patient not taking: Reported on 08/03/2019) 30 tablet 3  . PROPRANOLOL HCL PO Take by mouth.    . rizatriptan (MAXALT) 10 MG tablet Take 1 tablet (10 mg total) by mouth as needed for migraine. May repeat in 2 hours if needed (Patient not taking: Reported on 08/03/2019) 10 tablet 11  . Topiramate ER (QUDEXY XR) 150 MG CS24 sprinkle capsule Take 150 mg by mouth at bedtime. (Patient not taking: Reported on 08/03/2019) 30 each 11  . traMADol (ULTRAM) 50 MG tablet Take 1 tablet (50 mg total) by mouth every 6 (six) hours as needed for moderate pain. (Patient not taking: Reported on 08/03/2019) 30 tablet 0   No current facility-administered medications for this visit.   Facility-Administered Medications Ordered in Other Visits  Medication Dose Route Frequency Provider Last Rate Last Admin  . gadopentetate dimeglumine (MAGNEVIST) injection 18 mL  18 mL Intravenous Once PRN Melvenia Beam, MD         Allergies: No Known Allergies    Physical Exam:   Blood pressure 119/71, pulse 86, temperature 98.5 F (36.9 C), temperature source Temporal, resp. rate 18, height 5\' 3"  (1.6 m), weight 216 lb 6.4 oz (98.2 kg), SpO2 100 %.    ECOG: 0       General appearance: Comfortable appearing without any discomfort Head: Normocephalic without any trauma Oropharynx: Mucous membranes are moist and pink without any thrush or ulcers. Eyes: Pupils are equal and round reactive to light.  Lymph nodes: No cervical, supraclavicular, inguinal or axillary lymphadenopathy.   Heart:regular rate and rhythm.  S1 and S2 without leg edema. Lung: Clear without any rhonchi or wheezes.  No dullness to percussion. Abdomin: Soft, nontender, nondistended with good bowel sounds.  No hepatosplenomegaly. Musculoskeletal: No joint deformity or effusion.  Full range of motion noted. Neurological: No deficits noted on motor, sensory and deep tendon  reflex exam. Skin: No petechial rash or dryness.  Appeared moist.        Lab Results: Lab Results  Component Value Date   WBC 7.3 07/02/2019   HGB 11.6 (L) 07/02/2019   HCT 42.1 07/02/2019   MCV 65.4 (L) 07/02/2019   PLT 854 (H) 07/02/2019     Chemistry      Component Value Date/Time   NA 139 06/10/2019 2000   NA 140 02/20/2016 1432   K 3.9 06/10/2019 2000   K 4.1 02/20/2016 1432   CL 106 06/10/2019 2000   CO2 26 06/10/2019 2000   CO2 25 02/20/2016 1432   BUN 12 06/10/2019 2000   BUN 11.1 02/20/2016 1432   CREATININE 0.84 06/10/2019 2000   CREATININE 1.0 02/20/2016 1432      Component Value Date/Time   CALCIUM 9.1 06/10/2019 2000   CALCIUM 9.8 02/20/2016 1432   ALKPHOS 80 06/10/2019 2000   ALKPHOS 115 02/20/2016 1432   AST 15 06/10/2019 2000   AST 13 02/20/2016 1432   ALT 12 06/10/2019 2000   ALT 13 02/20/2016 1432   BILITOT 0.3 06/10/2019 2000   BILITOT 0.71 02/20/2016 1432      Impression and Plan:  49 year old woman with:  1.JAK2 positive polycythemia vera diagnosed in 2017.    She has been receiving therapeutic phlebotomy to keep her hematocrit less than 45.  Her counts for the last 6 months have been reasonably controlled without any intervention.  Risks and benefits of proceeding with phlebotomy was reviewed today.  Her hematocrit less than 45 and will continue with observation at this time and institute phlebotomy only if her hematocrit is above 45.   2. Thrombosis prophylaxis: Continues to be on aspirin without any issues at this time.  3. Cytoreductive therapy: Platelet count continues to increase which could be a function of her worsening iron deficiency in addition to her myeloproliferative disorder.  The role of hydroxyurea was reviewed at this time.  Risks and benefits of adding this medication was reviewed and we have opted to continue to defer this option.  Her platelet count is adequate at this time and does not require intervention.  4.  Migraine headaches: Improved with no intervention at this time and not sign of polycythemia exacerbation.  5. Microcytosis: Related to iron deficiency.  We will continue to monitor iron stores and replace as needed.  I have encouraged her to increase her iron rich foods at this time.  6. Follow-up: She will continue to follow monthly and MD follow-up in 6 months.  30  minutes was dedicated to the visit today.  The time was spent on reviewing laboratory data, disease status update, treatment options and future plan of care discussion.   Zola Button, MD 2/23/20219:26 AM

## 2019-08-04 ENCOUNTER — Telehealth: Payer: Self-pay | Admitting: Oncology

## 2019-08-04 NOTE — Telephone Encounter (Signed)
Scheduled appt per 2/23 los.  Spoke with pt and she is aware of her appt date and time.

## 2019-08-31 ENCOUNTER — Inpatient Hospital Stay: Payer: No Typology Code available for payment source

## 2019-08-31 ENCOUNTER — Inpatient Hospital Stay: Payer: No Typology Code available for payment source | Attending: Oncology

## 2019-08-31 ENCOUNTER — Other Ambulatory Visit: Payer: Self-pay

## 2019-08-31 DIAGNOSIS — D500 Iron deficiency anemia secondary to blood loss (chronic): Secondary | ICD-10-CM | POA: Diagnosis present

## 2019-08-31 DIAGNOSIS — D45 Polycythemia vera: Secondary | ICD-10-CM

## 2019-08-31 LAB — CBC WITH DIFFERENTIAL (CANCER CENTER ONLY)
Abs Immature Granulocytes: 0.06 10*3/uL (ref 0.00–0.07)
Basophils Absolute: 0.2 10*3/uL — ABNORMAL HIGH (ref 0.0–0.1)
Basophils Relative: 3 %
Eosinophils Absolute: 0.4 10*3/uL (ref 0.0–0.5)
Eosinophils Relative: 5 %
HCT: 43.9 % (ref 36.0–46.0)
Hemoglobin: 12 g/dL (ref 12.0–15.0)
Immature Granulocytes: 1 %
Lymphocytes Relative: 20 %
Lymphs Abs: 1.5 10*3/uL (ref 0.7–4.0)
MCH: 18.2 pg — ABNORMAL LOW (ref 26.0–34.0)
MCHC: 27.3 g/dL — ABNORMAL LOW (ref 30.0–36.0)
MCV: 66.5 fL — ABNORMAL LOW (ref 80.0–100.0)
Monocytes Absolute: 0.4 10*3/uL (ref 0.1–1.0)
Monocytes Relative: 5 %
Neutro Abs: 5 10*3/uL (ref 1.7–7.7)
Neutrophils Relative %: 66 %
Platelet Count: 552 10*3/uL — ABNORMAL HIGH (ref 150–400)
RBC: 6.6 MIL/uL — ABNORMAL HIGH (ref 3.87–5.11)
RDW: 22.5 % — ABNORMAL HIGH (ref 11.5–15.5)
WBC Count: 7.5 10*3/uL (ref 4.0–10.5)
nRBC: 0 % (ref 0.0–0.2)

## 2019-08-31 LAB — IRON AND TIBC
Iron: 16 ug/dL — ABNORMAL LOW (ref 41–142)
Saturation Ratios: 5 % — ABNORMAL LOW (ref 21–57)
TIBC: 329 ug/dL (ref 236–444)
UIBC: 313 ug/dL (ref 120–384)

## 2019-08-31 LAB — FERRITIN: Ferritin: <4 ng/mL — ABNORMAL LOW (ref 11–307)

## 2019-08-31 NOTE — Progress Notes (Signed)
Per Dr. [name], phlebotomy not needed today. Patient aware.

## 2019-10-01 ENCOUNTER — Other Ambulatory Visit: Payer: Self-pay

## 2019-10-01 ENCOUNTER — Inpatient Hospital Stay: Payer: No Typology Code available for payment source | Attending: Oncology

## 2019-10-01 ENCOUNTER — Inpatient Hospital Stay: Payer: No Typology Code available for payment source

## 2019-10-01 DIAGNOSIS — D45 Polycythemia vera: Secondary | ICD-10-CM | POA: Diagnosis present

## 2019-10-01 LAB — CBC WITH DIFFERENTIAL (CANCER CENTER ONLY)
Abs Immature Granulocytes: 0.06 10*3/uL (ref 0.00–0.07)
Basophils Absolute: 0.3 10*3/uL — ABNORMAL HIGH (ref 0.0–0.1)
Basophils Relative: 3 %
Eosinophils Absolute: 0.5 10*3/uL (ref 0.0–0.5)
Eosinophils Relative: 5 %
HCT: 46.8 % — ABNORMAL HIGH (ref 36.0–46.0)
Hemoglobin: 12.8 g/dL (ref 12.0–15.0)
Immature Granulocytes: 1 %
Lymphocytes Relative: 19 %
Lymphs Abs: 1.6 10*3/uL (ref 0.7–4.0)
MCH: 18.5 pg — ABNORMAL LOW (ref 26.0–34.0)
MCHC: 27.4 g/dL — ABNORMAL LOW (ref 30.0–36.0)
MCV: 67.5 fL — ABNORMAL LOW (ref 80.0–100.0)
Monocytes Absolute: 0.3 10*3/uL (ref 0.1–1.0)
Monocytes Relative: 4 %
Neutro Abs: 5.8 10*3/uL (ref 1.7–7.7)
Neutrophils Relative %: 68 %
Platelet Count: 598 10*3/uL — ABNORMAL HIGH (ref 150–400)
RBC: 6.93 MIL/uL — ABNORMAL HIGH (ref 3.87–5.11)
RDW: 22.7 % — ABNORMAL HIGH (ref 11.5–15.5)
WBC Count: 8.6 10*3/uL (ref 4.0–10.5)
nRBC: 0 % (ref 0.0–0.2)

## 2019-10-01 NOTE — Patient Instructions (Signed)
Therapeutic Phlebotomy Therapeutic phlebotomy is the planned removal of blood from a person's body for the purpose of treating a medical condition. The procedure is similar to donating blood. Usually, about a pint (470 mL, or 0.47 L) of blood is removed. The average adult has 9-12 pints (4.3-5.7 L) of blood in the body. Therapeutic phlebotomy may be used to treat the following medical conditions:  Hemochromatosis. This is a condition in which the blood contains too much iron.  Polycythemia vera. This is a condition in which the blood contains too many red blood cells.  Porphyria cutanea tarda. This is a disease in which an important part of hemoglobin is not made properly. It results in the buildup of abnormal amounts of porphyrins in the body.  Sickle cell disease. This is a condition in which the red blood cells form an abnormal crescent shape rather than a round shape. Tell a health care provider about:  Any allergies you have.  All medicines you are taking, including vitamins, herbs, eye drops, creams, and over-the-counter medicines.  Any problems you or family members have had with anesthetic medicines.  Any blood disorders you have.  Any surgeries you have had.  Any medical conditions you have.  Whether you are pregnant or may be pregnant. What are the risks? Generally, this is a safe procedure. However, problems may occur, including:  Nausea or light-headedness.  Low blood pressure (hypotension).  Soreness, bleeding, swelling, or bruising at the needle insertion site.  Infection. What happens before the procedure?  Follow instructions from your health care provider about eating or drinking restrictions.  Ask your health care provider about: ? Changing or stopping your regular medicines. This is especially important if you are taking diabetes medicines or blood thinners (anticoagulants). ? Taking medicines such as aspirin and ibuprofen. These medicines can thin your  blood. Do not take these medicines unless your health care provider tells you to take them. ? Taking over-the-counter medicines, vitamins, herbs, and supplements.  Wear clothing with sleeves that can be raised above the elbow.  Plan to have someone take you home from the hospital or clinic.  You may have a blood sample taken.  Your blood pressure, pulse rate, and breathing rate will be measured. What happens during the procedure?   To lower your risk of infection: ? Your health care team will wash or sanitize their hands. ? Your skin will be cleaned with an antiseptic.  You may be given a medicine to numb the area (local anesthetic).  A tourniquet will be placed on your arm.  A needle will be inserted into one of your veins.  Tubing and a collection bag will be attached to that needle.  Blood will flow through the needle and tubing into the collection bag.  The collection bag will be placed lower than your arm to allow gravity to help the flow of blood into the bag.  You may be asked to open and close your hand slowly and continually during the entire collection.  After the specified amount of blood has been removed from your body, the collection bag and tubing will be clamped.  The needle will be removed from your vein.  Pressure will be held on the site of the needle insertion to stop the bleeding.  A bandage (dressing) will be placed over the needle insertion site. The procedure may vary among health care providers and hospitals. What happens after the procedure?  Your blood pressure, pulse rate, and breathing rate will be   measured after the procedure.  You will be encouraged to drink fluids.  Your recovery will be assessed and monitored.  You can return to your normal activities as told by your health care provider. Summary  Therapeutic phlebotomy is the planned removal of blood from a person's body for the purpose of treating a medical condition.  Therapeutic  phlebotomy may be used to treat hemochromatosis, polycythemia vera, porphyria cutanea tarda, or sickle cell disease.  In the procedure, a needle is inserted and about a pint (470 mL, or 0.47 L) of blood is removed. The average adult has 9-12 pints (4.3-5.7 L) of blood in the body.  This is generally a safe procedure, but it can sometimes cause problems such as nausea, light-headedness, or low blood pressure (hypotension). This information is not intended to replace advice given to you by your health care provider. Make sure you discuss any questions you have with your health care provider. Document Released: 10/29/2010 Document Revised: 06/12/2017 Document Reviewed: 06/12/2017 Elsevier Patient Education  2020 Elsevier Inc.  

## 2019-10-01 NOTE — Progress Notes (Signed)
Patient presented today for phlebotomy per MD order. A 16 gauge needle was placed in the left AC and 539ml of blood was removed.  The needle was removed with catheter fully intact. Patient stayed for 30 min post observation, beverage provided. Patient ambulated from facility in stable condition.

## 2019-11-01 ENCOUNTER — Inpatient Hospital Stay: Payer: No Typology Code available for payment source | Attending: Oncology

## 2019-11-01 ENCOUNTER — Inpatient Hospital Stay: Payer: No Typology Code available for payment source

## 2019-11-01 ENCOUNTER — Other Ambulatory Visit: Payer: Self-pay

## 2019-11-01 DIAGNOSIS — D45 Polycythemia vera: Secondary | ICD-10-CM | POA: Diagnosis present

## 2019-11-01 LAB — CBC WITH DIFFERENTIAL (CANCER CENTER ONLY)
Abs Immature Granulocytes: 0.05 10*3/uL (ref 0.00–0.07)
Basophils Absolute: 0.1 10*3/uL (ref 0.0–0.1)
Basophils Relative: 2 %
Eosinophils Absolute: 0.4 10*3/uL (ref 0.0–0.5)
Eosinophils Relative: 5 %
HCT: 44.9 % (ref 36.0–46.0)
Hemoglobin: 12.4 g/dL (ref 12.0–15.0)
Immature Granulocytes: 1 %
Lymphocytes Relative: 14 %
Lymphs Abs: 1.1 10*3/uL (ref 0.7–4.0)
MCH: 18.5 pg — ABNORMAL LOW (ref 26.0–34.0)
MCHC: 27.6 g/dL — ABNORMAL LOW (ref 30.0–36.0)
MCV: 66.9 fL — ABNORMAL LOW (ref 80.0–100.0)
Monocytes Absolute: 0.7 10*3/uL (ref 0.1–1.0)
Monocytes Relative: 9 %
Neutro Abs: 5.2 10*3/uL (ref 1.7–7.7)
Neutrophils Relative %: 69 %
Platelet Count: 578 10*3/uL — ABNORMAL HIGH (ref 150–400)
RBC: 6.71 MIL/uL — ABNORMAL HIGH (ref 3.87–5.11)
RDW: 23.5 % — ABNORMAL HIGH (ref 11.5–15.5)
WBC Count: 7.6 10*3/uL (ref 4.0–10.5)
nRBC: 0 % (ref 0.0–0.2)

## 2019-11-01 NOTE — Progress Notes (Signed)
Hct 44.9 today. NO phlebotomy required.

## 2019-12-02 ENCOUNTER — Inpatient Hospital Stay: Payer: No Typology Code available for payment source | Attending: Oncology

## 2019-12-02 ENCOUNTER — Inpatient Hospital Stay: Payer: No Typology Code available for payment source

## 2019-12-02 ENCOUNTER — Other Ambulatory Visit: Payer: Self-pay

## 2019-12-02 DIAGNOSIS — D45 Polycythemia vera: Secondary | ICD-10-CM | POA: Insufficient documentation

## 2019-12-02 DIAGNOSIS — D500 Iron deficiency anemia secondary to blood loss (chronic): Secondary | ICD-10-CM

## 2019-12-02 LAB — CBC WITH DIFFERENTIAL (CANCER CENTER ONLY)
Abs Immature Granulocytes: 0.05 10*3/uL (ref 0.00–0.07)
Basophils Absolute: 0.3 10*3/uL — ABNORMAL HIGH (ref 0.0–0.1)
Basophils Relative: 3 %
Eosinophils Absolute: 0.6 10*3/uL — ABNORMAL HIGH (ref 0.0–0.5)
Eosinophils Relative: 7 %
HCT: 44.4 % (ref 36.0–46.0)
Hemoglobin: 12.2 g/dL (ref 12.0–15.0)
Immature Granulocytes: 1 %
Lymphocytes Relative: 20 %
Lymphs Abs: 1.8 10*3/uL (ref 0.7–4.0)
MCH: 18.5 pg — ABNORMAL LOW (ref 26.0–34.0)
MCHC: 27.5 g/dL — ABNORMAL LOW (ref 30.0–36.0)
MCV: 67.5 fL — ABNORMAL LOW (ref 80.0–100.0)
Monocytes Absolute: 0.4 10*3/uL (ref 0.1–1.0)
Monocytes Relative: 5 %
Neutro Abs: 5.9 10*3/uL (ref 1.7–7.7)
Neutrophils Relative %: 64 %
Platelet Count: 575 10*3/uL — ABNORMAL HIGH (ref 150–400)
RBC: 6.58 MIL/uL — ABNORMAL HIGH (ref 3.87–5.11)
RDW: 23.8 % — ABNORMAL HIGH (ref 11.5–15.5)
WBC Count: 9 10*3/uL (ref 4.0–10.5)
nRBC: 0 % (ref 0.0–0.2)

## 2019-12-02 LAB — IRON AND TIBC
Iron: 21 ug/dL — ABNORMAL LOW (ref 41–142)
Saturation Ratios: 6 % — ABNORMAL LOW (ref 21–57)
TIBC: 355 ug/dL (ref 236–444)
UIBC: 334 ug/dL (ref 120–384)

## 2019-12-02 LAB — FERRITIN: Ferritin: <4 ng/mL — ABNORMAL LOW (ref 11–307)

## 2019-12-02 NOTE — Progress Notes (Signed)
Meredith Mody Perrelli presents today for phlebotomy per MD orders. Hct is 44.4, confirmed with Dr. [name] to proceed with phlebotomy. Phlebotomy procedure started at 0930 and ended at 0934. 16 g. Phlebotomy kit to L AC. 518 grams removed. Patient observed for 30 minutes after procedure without any incident.  VS stable. Patient tolerated procedure well. IV needle removed intact. Pt had drinks & snacks, DC'd in stable condition.

## 2019-12-02 NOTE — Patient Instructions (Signed)
Therapeutic Phlebotomy Therapeutic phlebotomy is the planned removal of blood from a person's body for the purpose of treating a medical condition. The procedure is similar to donating blood. Usually, about a pint (470 mL, or 0.47 L) of blood is removed. The average adult has 9-12 pints (4.3-5.7 L) of blood in the body. Therapeutic phlebotomy may be used to treat the following medical conditions:  Hemochromatosis. This is a condition in which the blood contains too much iron.  Polycythemia vera. This is a condition in which the blood contains too many red blood cells.  Porphyria cutanea tarda. This is a disease in which an important part of hemoglobin is not made properly. It results in the buildup of abnormal amounts of porphyrins in the body.  Sickle cell disease. This is a condition in which the red blood cells form an abnormal crescent shape rather than a round shape. Tell a health care provider about:  Any allergies you have.  All medicines you are taking, including vitamins, herbs, eye drops, creams, and over-the-counter medicines.  Any problems you or family members have had with anesthetic medicines.  Any blood disorders you have.  Any surgeries you have had.  Any medical conditions you have.  Whether you are pregnant or may be pregnant. What are the risks? Generally, this is a safe procedure. However, problems may occur, including:  Nausea or light-headedness.  Low blood pressure (hypotension).  Soreness, bleeding, swelling, or bruising at the needle insertion site.  Infection. What happens before the procedure?  Follow instructions from your health care provider about eating or drinking restrictions.  Ask your health care provider about: ? Changing or stopping your regular medicines. This is especially important if you are taking diabetes medicines or blood thinners (anticoagulants). ? Taking medicines such as aspirin and ibuprofen. These medicines can thin your  blood. Do not take these medicines unless your health care provider tells you to take them. ? Taking over-the-counter medicines, vitamins, herbs, and supplements.  Wear clothing with sleeves that can be raised above the elbow.  Plan to have someone take you home from the hospital or clinic.  You may have a blood sample taken.  Your blood pressure, pulse rate, and breathing rate will be measured. What happens during the procedure?   To lower your risk of infection: ? Your health care team will wash or sanitize their hands. ? Your skin will be cleaned with an antiseptic.  You may be given a medicine to numb the area (local anesthetic).  A tourniquet will be placed on your arm.  A needle will be inserted into one of your veins.  Tubing and a collection bag will be attached to that needle.  Blood will flow through the needle and tubing into the collection bag.  The collection bag will be placed lower than your arm to allow gravity to help the flow of blood into the bag.  You may be asked to open and close your hand slowly and continually during the entire collection.  After the specified amount of blood has been removed from your body, the collection bag and tubing will be clamped.  The needle will be removed from your vein.  Pressure will be held on the site of the needle insertion to stop the bleeding.  A bandage (dressing) will be placed over the needle insertion site. The procedure may vary among health care providers and hospitals. What happens after the procedure?  Your blood pressure, pulse rate, and breathing rate will be   measured after the procedure.  You will be encouraged to drink fluids.  Your recovery will be assessed and monitored.  You can return to your normal activities as told by your health care provider. Summary  Therapeutic phlebotomy is the planned removal of blood from a person's body for the purpose of treating a medical condition.  Therapeutic  phlebotomy may be used to treat hemochromatosis, polycythemia vera, porphyria cutanea tarda, or sickle cell disease.  In the procedure, a needle is inserted and about a pint (470 mL, or 0.47 L) of blood is removed. The average adult has 9-12 pints (4.3-5.7 L) of blood in the body.  This is generally a safe procedure, but it can sometimes cause problems such as nausea, light-headedness, or low blood pressure (hypotension). This information is not intended to replace advice given to you by your health care provider. Make sure you discuss any questions you have with your health care provider. Document Released: 10/29/2010 Document Revised: 06/12/2017 Document Reviewed: 06/12/2017 Elsevier Patient Education  2020 Elsevier Inc.  

## 2019-12-06 ENCOUNTER — Emergency Department (HOSPITAL_COMMUNITY): Payer: No Typology Code available for payment source

## 2019-12-06 ENCOUNTER — Emergency Department (HOSPITAL_COMMUNITY)
Admission: EM | Admit: 2019-12-06 | Discharge: 2019-12-06 | Disposition: A | Discharge status: Home / Self Care | Payer: No Typology Code available for payment source | Attending: Emergency Medicine | Admitting: Emergency Medicine

## 2019-12-06 ENCOUNTER — Other Ambulatory Visit: Payer: Self-pay

## 2019-12-06 ENCOUNTER — Encounter (HOSPITAL_COMMUNITY): Payer: Self-pay | Admitting: Emergency Medicine

## 2019-12-06 DIAGNOSIS — Z20822 Contact with and (suspected) exposure to covid-19: Secondary | ICD-10-CM | POA: Diagnosis not present

## 2019-12-06 DIAGNOSIS — R1011 Right upper quadrant pain: Secondary | ICD-10-CM | POA: Insufficient documentation

## 2019-12-06 DIAGNOSIS — R0789 Other chest pain: Secondary | ICD-10-CM | POA: Diagnosis present

## 2019-12-06 HISTORY — DX: Polycythemia vera: D45

## 2019-12-06 LAB — BASIC METABOLIC PANEL
Anion gap: 10 (ref 5–15)
BUN: 10 mg/dL (ref 6–20)
CO2: 25 mmol/L (ref 22–32)
Calcium: 9.6 mg/dL (ref 8.9–10.3)
Chloride: 104 mmol/L (ref 98–111)
Creatinine, Ser: 0.91 mg/dL (ref 0.44–1.00)
GFR calc Af Amer: >60 mL/min (ref >60)
GFR calc non Af Amer: >60 mL/min (ref >60)
Glucose, Bld: 93 mg/dL (ref 70–99)
Potassium: 3.9 mmol/L (ref 3.5–5.1)
Sodium: 139 mmol/L (ref 135–145)

## 2019-12-06 LAB — HEPATIC FUNCTION PANEL
ALT: 17 U/L (ref 0–44)
AST: 20 U/L (ref 15–41)
Albumin: 4.4 g/dL (ref 3.5–5.0)
Alkaline Phosphatase: 87 U/L (ref 38–126)
Bilirubin, Direct: 0.1 mg/dL (ref 0.0–0.2)
Indirect Bilirubin: 0.6 mg/dL (ref 0.3–0.9)
Total Bilirubin: 0.7 mg/dL (ref 0.3–1.2)
Total Protein: 7.9 g/dL (ref 6.5–8.1)

## 2019-12-06 LAB — I-STAT BETA HCG BLOOD, ED (MC, WL, AP ONLY): I-stat hCG, quantitative: 7.9 m[IU]/mL — ABNORMAL HIGH (ref <5)

## 2019-12-06 LAB — SARS CORONAVIRUS 2 BY RT PCR (HOSPITAL ORDER, PERFORMED IN ~~LOC~~ HOSPITAL LAB): SARS Coronavirus 2: NEGATIVE

## 2019-12-06 LAB — CBC
HCT: 44.4 % (ref 36.0–46.0)
Hemoglobin: 12 g/dL (ref 12.0–15.0)
MCH: 18.3 pg — ABNORMAL LOW (ref 26.0–34.0)
MCHC: 27 g/dL — ABNORMAL LOW (ref 30.0–36.0)
MCV: 67.8 fL — ABNORMAL LOW (ref 80.0–100.0)
Platelets: 781 10*3/uL — ABNORMAL HIGH (ref 150–400)
RBC: 6.55 MIL/uL — ABNORMAL HIGH (ref 3.87–5.11)
RDW: 23.7 % — ABNORMAL HIGH (ref 11.5–15.5)
WBC: 9.3 10*3/uL (ref 4.0–10.5)
nRBC: 0 % (ref 0.0–0.2)

## 2019-12-06 LAB — LIPASE, BLOOD: Lipase: 29 U/L (ref 11–51)

## 2019-12-06 LAB — TROPONIN I (HIGH SENSITIVITY)
Troponin I (High Sensitivity): 2 ng/L (ref <18)
Troponin I (High Sensitivity): 3 ng/L (ref <18)

## 2019-12-06 MED ORDER — SODIUM CHLORIDE 0.9% FLUSH
3.0000 mL | Freq: Once | INTRAVENOUS | Status: AC
  Administered 2019-12-06: 3 mL via INTRAVENOUS

## 2019-12-06 MED ORDER — FAMOTIDINE 40 MG PO TABS
40.0000 mg | ORAL_TABLET | Freq: Every day | ORAL | 0 refills | Status: DC
Start: 2019-12-06 — End: 2020-07-11

## 2019-12-06 MED ORDER — ALUM & MAG HYDROXIDE-SIMETH 400-400-40 MG/5ML PO SUSP: 10.0000 mL | Freq: Four times a day (QID) | ORAL | 0 refills | Status: DC | PRN

## 2019-12-06 MED ORDER — ALUM & MAG HYDROXIDE-SIMETH 200-200-20 MG/5ML PO SUSP
15.0000 mL | Freq: Once | ORAL | Status: AC
  Administered 2019-12-06: 15 mL via ORAL
  Filled 2019-12-06: qty 30

## 2019-12-06 NOTE — ED Notes (Signed)
Patient transported to X-ray 

## 2019-12-06 NOTE — ED Notes (Signed)
Pt transported to US

## 2019-12-06 NOTE — ED Provider Notes (Signed)
Emergency Department Provider Note   I have reviewed the triage vital signs and the nursing notes.   HISTORY  Chief Complaint Chest Pain   HPI Eileen [name] is a 49 y.o. female with PMH reviewed below presents to the ED with chest pressure.  Patient describes central chest pressure worsening 3 to 4 days ago.  She noted that after drinking milk it became much worse and lasted for approximately 1 hour and then resolved on its own.  She saw her primary care doctor who ordered an ultrasound 3 days ago which was normal.  Patient called back and was told that her labs are abnormal and she should present to the emergency department for possibly an issue with her gallbladder.  She continues to have some mild central chest discomfort.  She denies any burning pain.  She had been eating very little over the past several days and then try to eat a more normal meal and had return of the pain more severe today.  No fevers or chills.  Denies shortness of breath.  No productive cough. Pain radiates up the center of the chest and is worse with food.    Past Medical History:  Diagnosis Date  . Acute duodenal ulcer without mention of hemorrhage, perforation, or obstruction   . Dysrhythmia    History of palpations, for many years  . GERD (gastroesophageal reflux disease)   . Headache    takes Advil  . Polycythemia vera ([place])   . Reflux esophagitis     Patient Active Problem List   Diagnosis Date Noted  . 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 Polycythemia vera ([place]) 02/20/2016  . MORBID OBESITY 08/11/2007  . ANEMIA DUE TO CHRONIC BLOOD LOSS 08/11/2007  . ESOPHAGITIS 08/11/2007  . GERD 08/11/2007  . Headache 08/11/2007  . Personal history of peptic ulcer disease 08/11/2007    Past Surgical History:  Procedure Laterality Date  . ABDOMINAL HYSTERECTOMY  2011ish  . CESAREAN SECTION     x 3   . ORIF ANKLE FRACTURE Left 12/21/2014   Procedure: OPEN  REDUCTION INTERNAL FIXATION (ORIF) LEFT LATERAL MALLEOLUS ANKLE FRACTURE;  Surgeon: Leandrew Koyanagi, MD;  Location: [place];  Service: Orthopedics;  Laterality: Left;  Needs RNFA  . UPPER GI ENDOSCOPY      Allergies Patient has no known allergies.  Family History  Problem Relation Age of Onset  . Migraines Mother   . Breast cancer Other        [place]   . Colon cancer Neg Hx     Social History Social History   Tobacco Use  . Smoking status: Never Smoker  . Smokeless tobacco: Never Used  Vaping Use  . Vaping Use: Never used  Substance Use Topics  . Alcohol use: No  . Drug use: No    Review of Systems  Constitutional: No fever/chills Eyes: No visual changes. ENT: No sore throat. Cardiovascular: Positive chest pain. Respiratory: Denies shortness of breath. Gastrointestinal: Positive epigastric abdominal pain.  Positive nausea, no vomiting.  No diarrhea.  No constipation. No black or BRB in BMs.  Genitourinary: Negative for dysuria. Musculoskeletal: Negative for back pain. Skin: Negative for rash. Neurological: Negative for headaches, focal weakness or numbness.  10-point ROS otherwise negative.  ____________________________________________   PHYSICAL EXAM:  VITAL SIGNS: ED Triage Vitals [12/06/19 1219]  Enc Vitals Group     BP (!) 143/96     Pulse Rate 83  Resp 17     Temp 98.4 F (36.9 C)     Temp Source Oral     SpO2 100 %   Constitutional: Alert and oriented. Well appearing and in no acute distress. Eyes: Conjunctivae are normal.  Head: Atraumatic. Nose: No congestion/rhinnorhea. Mouth/Throat: Mucous membranes are moist.   Neck: No stridor. Cardiovascular: Normal rate, regular rhythm. Good peripheral circulation. Grossly normal heart sounds.   Respiratory: Normal respiratory effort.  No retractions. Lungs CTAB. Gastrointestinal: Soft with mild focal epigastric and RUQ tenderness. Negative Murphy's sign. No distention.  Musculoskeletal: No gross  deformities of extremities. Neurologic:  Normal speech and language.  Skin:  Skin is warm, dry and intact. No rash noted.  ____________________________________________   LABS (all labs ordered are listed, but only abnormal results are displayed)  Labs Reviewed  CBC - Abnormal; Notable for the following components:      Result Value   RBC 6.55 (*)    MCV 67.8 (*)    MCH 18.3 (*)    MCHC 27.0 (*)    RDW 23.7 (*)    Platelets 781 (*)    All other components within normal limits  I-STAT BETA HCG BLOOD, ED ([facility], [facility], [facility] ONLY) - Abnormal; Notable for the following components:   I-stat hCG, quantitative 7.9 (*)    All other components within normal limits  SARS CORONAVIRUS 2 BY RT PCR (HOSPITAL ORDER, [place] [facility])  BASIC METABOLIC PANEL  HEPATIC FUNCTION PANEL  LIPASE, BLOOD  TROPONIN I (HIGH SENSITIVITY)  TROPONIN I (HIGH SENSITIVITY)   ____________________________________________  EKG   EKG Interpretation  Date/Time:  Monday December 06 2019 12:25:13 EDT Ventricular Rate:  91 PR Interval:  140 QRS Duration: 94 QT Interval:  374 QTC Calculation: 460 R Axis:   52 Text Interpretation: Normal sinus rhythm Incomplete right bundle branch block Borderline ECG No STEMI Confirmed by Nanda Quinton [phone]) on 12/06/2019 5:23:37 PM       ____________________________________________  RADIOLOGY  DG Chest 2 View  Result Date: 12/06/2019 CLINICAL DATA:  Chest pain since 12/03/2019. EXAM: CHEST - 2 VIEW COMPARISON:  Single-view of the chest 06/11/2019. FINDINGS: The lungs are clear. Heart size is normal. No pneumothorax or pleural fluid. No bony abnormality. IMPRESSION: Negative chest. Electronically Signed   By: Inge Rise M.D.   On: 12/06/2019 13:16   US Abdomen Limited RUQ  Result Date: 12/06/2019 CLINICAL DATA:  Right upper quadrant abdominal pain EXAM: ULTRASOUND ABDOMEN LIMITED RIGHT UPPER QUADRANT COMPARISON:  07/13/2018 FINDINGS: Gallbladder: No  gallstones or wall thickening visualized. No sonographic Murphy sign noted by sonographer. Common bile duct: Diameter: 4 mm, normal Liver: No focal liver lesion. Slightly increased echogenicity could indicate mild steatosis. Portal vein is patent on color Doppler imaging with normal direction of blood flow towards the liver. Other: None. IMPRESSION: No evidence of gallbladder disease or ductal dilatation. Mildly prominent echogenicity of the liver parenchyma could suggest mild steatosis. Electronically Signed   By: Nelson Chimes M.D.   On: 12/06/2019 18:51    ____________________________________________   PROCEDURES  Procedure(s) performed:   Procedures  None  ____________________________________________   INITIAL IMPRESSION / ASSESSMENT AND PLAN / ED COURSE  Pertinent labs & imaging results that were available during my care of the patient were reviewed by me and considered in my medical decision making (see chart for details).   Patient presents to the emergency department for evaluation of chest discomfort.  EKG is reassuring with normal chest x-ray.  Most of her symptoms seem GI related clinically.  She has some epigastric discomfort.  Reviewed [facility] ultrasound read from 3 days ago which did not show any acute gallbladder pathology at that time.  Labs from triage did not include LFTs or lipase.  I have added these on and will trend her troponin.  Lower suspicion for CAD clinically.  Patient is low risk for PE and PERC negative. Low risk by HEART score.   07:14 PM  Patient's right upper quadrant ultrasound, LFTs, lipase are normal.  No gallstones on ultrasound to suspect symptomatic cholelithiasis which would require urgent surgery evaluation.  Patient's exam is not consistent with this either.  Most of her discomfort is in the epigastric region and radiating up the chest.  She may benefit from GI referral and upper endoscopy.  I will add Pepcid to her Prilosec and have her take Maalox as  needed for symptoms.  Have placed a referral in our system to help facilitate GI follow-up but advised that she will need to follow closely with her PCP as well.  Discussed ED return precautions in detail.  Patient comfortable with the plan at discharge.  ____________________________________________  FINAL CLINICAL IMPRESSION(S) / ED DIAGNOSES  Final diagnoses:  RUQ abdominal pain     MEDICATIONS GIVEN DURING THIS VISIT:  Medications  sodium chloride flush (NS) 0.9 % injection 3 mL (3 mLs Intravenous Given 12/06/19 1808)  alum & mag hydroxide-simeth (MAALOX/MYLANTA) 200-200-20 MG/5ML suspension 15 mL (15 mLs Oral Given 12/06/19 1808)     NEW OUTPATIENT MEDICATIONS STARTED DURING THIS VISIT:  New Prescriptions   ALUM & MAG HYDROXIDE-SIMETH (MAALOX ADVANCED MAX ST) 400-400-40 MG/5ML SUSPENSION    Take 10 mLs by mouth every 6 (six) hours as needed for indigestion.   FAMOTIDINE (PEPCID) 40 MG TABLET    Take 1 tablet (40 mg total) by mouth daily.    Note:  This document was prepared using Dragon voice recognition software and may include unintentional dictation errors.  Nanda Quinton, MD, [facility] Emergency Medicine    Eavan Gonterman, [facility], MD 12/06/19 [phone]

## 2019-12-06 NOTE — ED Notes (Signed)
Patient verbalizes understanding of discharge instructions. Opportunity for questioning and answers were provided. Armband removed by staff, pt discharged from ED ambulatory to home.  

## 2019-12-06 NOTE — ED Triage Notes (Addendum)
Pt reports central chest pain that began on Friday. She reports that she has a hx of ulcers so she thought It could be related to that, drank some milk which made it worse. States she saw her PCP who did an ultrasound and lab work. States she was called today and told to come here for a surgical consult for her gallbladder. Endorses mild chest pain still. U/s result in chart--gallbladder appears normal

## 2019-12-06 NOTE — Discharge Instructions (Signed)
You were seen in the emergency room today with abdominal and chest discomfort.  Your heart labs and imaging were normal.  Your ultrasound here of the gallbladder along with gallbladder and liver labs were also normal.  I have placed a referral to the gastroenterology group on call.  Please call the office tomorrow to schedule an appointment.  They may require a referral to be placed by your primary care doctor in which case he will also need to reach out to them.  I would like you following closely with your primary care doctor to facilitate these follow-up appointments.  If you develop worsening chest pain or other sudden severe symptoms please return immediately to the emergency department for reevaluation.

## 2019-12-08 ENCOUNTER — Telehealth: Payer: Self-pay | Admitting: Physician Assistant

## 2019-12-08 NOTE — Telephone Encounter (Signed)
we received a referral for this pt from [facility] Emergency pt was not happy with Aug or Sept dates.  She stated she would call her PCP and we received a referral from her PCP for Epigastric pain hx abnormal HIda Scan/GERD pain radiating to back. Please advise if we can get her a sooner appt.

## 2019-12-09 ENCOUNTER — Other Ambulatory Visit: Payer: Self-pay | Admitting: Gastroenterology

## 2019-12-09 ENCOUNTER — Other Ambulatory Visit (HOSPITAL_COMMUNITY): Payer: Self-pay | Admitting: Gastroenterology

## 2019-12-09 DIAGNOSIS — R1011 Right upper quadrant pain: Secondary | ICD-10-CM

## 2019-12-09 NOTE — Telephone Encounter (Signed)
No options for earlier appointments at this time. Agree with contacting PCP for ER follow up.

## 2019-12-22 ENCOUNTER — Ambulatory Visit (HOSPITAL_COMMUNITY)
Admission: RE | Admit: 2019-12-22 | Discharge: 2019-12-22 | Disposition: A | Discharge status: Home / Self Care | Payer: No Typology Code available for payment source | Source: Ambulatory Visit | Attending: Gastroenterology | Admitting: Gastroenterology

## 2019-12-22 ENCOUNTER — Ambulatory Visit (HOSPITAL_COMMUNITY): Admission: RE | Admit: 2019-12-22 | Payer: No Typology Code available for payment source | Source: Ambulatory Visit

## 2019-12-22 ENCOUNTER — Other Ambulatory Visit: Payer: Self-pay

## 2019-12-22 DIAGNOSIS — R1011 Right upper quadrant pain: Secondary | ICD-10-CM | POA: Insufficient documentation

## 2019-12-24 ENCOUNTER — Other Ambulatory Visit: Payer: Self-pay

## 2019-12-24 ENCOUNTER — Ambulatory Visit (HOSPITAL_COMMUNITY)
Admission: RE | Admit: 2019-12-24 | Discharge: 2019-12-24 | Disposition: A | Discharge status: Home / Self Care | Payer: No Typology Code available for payment source | Source: Ambulatory Visit | Attending: Gastroenterology | Admitting: Gastroenterology

## 2019-12-24 DIAGNOSIS — R1011 Right upper quadrant pain: Secondary | ICD-10-CM | POA: Insufficient documentation

## 2019-12-24 MED ORDER — TECHNETIUM TC 99M MEBROFENIN IV KIT
5.0000 | PACK | Freq: Once | INTRAVENOUS | Status: AC | PRN
  Administered 2019-12-24: 5 via INTRAVENOUS

## 2019-12-31 ENCOUNTER — Inpatient Hospital Stay: Payer: No Typology Code available for payment source | Attending: Oncology

## 2019-12-31 ENCOUNTER — Inpatient Hospital Stay: Payer: No Typology Code available for payment source

## 2019-12-31 ENCOUNTER — Other Ambulatory Visit: Payer: Self-pay

## 2019-12-31 DIAGNOSIS — D45 Polycythemia vera: Secondary | ICD-10-CM | POA: Diagnosis present

## 2019-12-31 DIAGNOSIS — D500 Iron deficiency anemia secondary to blood loss (chronic): Secondary | ICD-10-CM

## 2019-12-31 LAB — CBC WITH DIFFERENTIAL (CANCER CENTER ONLY)
Abs Immature Granulocytes: 0.05 10*3/uL (ref 0.00–0.07)
Basophils Absolute: 0.2 10*3/uL — ABNORMAL HIGH (ref 0.0–0.1)
Basophils Relative: 3 %
Eosinophils Absolute: 0.4 10*3/uL (ref 0.0–0.5)
Eosinophils Relative: 5 %
HCT: 41.6 % (ref 36.0–46.0)
Hemoglobin: 11.7 g/dL — ABNORMAL LOW (ref 12.0–15.0)
Immature Granulocytes: 1 %
Lymphocytes Relative: 18 %
Lymphs Abs: 1.4 10*3/uL (ref 0.7–4.0)
MCH: 19 pg — ABNORMAL LOW (ref 26.0–34.0)
MCHC: 28.1 g/dL — ABNORMAL LOW (ref 30.0–36.0)
MCV: 67.6 fL — ABNORMAL LOW (ref 80.0–100.0)
Monocytes Absolute: 0.4 10*3/uL (ref 0.1–1.0)
Monocytes Relative: 5 %
Neutro Abs: 5.3 10*3/uL (ref 1.7–7.7)
Neutrophils Relative %: 68 %
Platelet Count: 503 10*3/uL — ABNORMAL HIGH (ref 150–400)
RBC: 6.15 MIL/uL — ABNORMAL HIGH (ref 3.87–5.11)
RDW: 22.5 % — ABNORMAL HIGH (ref 11.5–15.5)
WBC Count: 7.7 10*3/uL (ref 4.0–10.5)
nRBC: 0 % (ref 0.0–0.2)

## 2019-12-31 LAB — IRON AND TIBC
Iron: 17 ug/dL — ABNORMAL LOW (ref 41–142)
Saturation Ratios: 5 % — ABNORMAL LOW (ref 21–57)
TIBC: 323 ug/dL (ref 236–444)
UIBC: 306 ug/dL (ref 120–384)

## 2019-12-31 LAB — FERRITIN: Ferritin: <4 ng/mL — ABNORMAL LOW (ref 11–307)

## 2019-12-31 NOTE — Progress Notes (Signed)
Hct 41.6. Phlebotomy not required today. Pt. Asymptomatic.

## 2020-01-24 ENCOUNTER — Ambulatory Visit: Payer: Self-pay | Admitting: Surgery

## 2020-02-01 ENCOUNTER — Telehealth: Payer: Self-pay | Admitting: Oncology

## 2020-02-01 ENCOUNTER — Inpatient Hospital Stay: Payer: No Typology Code available for payment source

## 2020-02-01 ENCOUNTER — Inpatient Hospital Stay: Payer: No Typology Code available for payment source | Attending: Oncology | Admitting: Oncology

## 2020-02-01 ENCOUNTER — Other Ambulatory Visit: Payer: Self-pay

## 2020-02-01 VITALS — BP 124/83 | HR 95 | Temp 97.5°F | Resp 18 | Ht 63.0 in | Wt 227.9 lb

## 2020-02-01 DIAGNOSIS — E611 Iron deficiency: Secondary | ICD-10-CM | POA: Diagnosis not present

## 2020-02-01 DIAGNOSIS — D500 Iron deficiency anemia secondary to blood loss (chronic): Secondary | ICD-10-CM

## 2020-02-01 DIAGNOSIS — Z7982 Long term (current) use of aspirin: Secondary | ICD-10-CM | POA: Insufficient documentation

## 2020-02-01 DIAGNOSIS — G43909 Migraine, unspecified, not intractable, without status migrainosus: Secondary | ICD-10-CM | POA: Diagnosis not present

## 2020-02-01 DIAGNOSIS — D45 Polycythemia vera: Secondary | ICD-10-CM | POA: Diagnosis present

## 2020-02-01 LAB — CBC WITH DIFFERENTIAL (CANCER CENTER ONLY)
Abs Immature Granulocytes: 0.06 10*3/uL (ref 0.00–0.07)
Basophils Absolute: 0.2 10*3/uL — ABNORMAL HIGH (ref 0.0–0.1)
Basophils Relative: 2 %
Eosinophils Absolute: 0.6 10*3/uL — ABNORMAL HIGH (ref 0.0–0.5)
Eosinophils Relative: 6 %
HCT: 43.4 % (ref 36.0–46.0)
Hemoglobin: 12 g/dL (ref 12.0–15.0)
Immature Granulocytes: 1 %
Lymphocytes Relative: 17 %
Lymphs Abs: 1.6 10*3/uL (ref 0.7–4.0)
MCH: 18.5 pg — ABNORMAL LOW (ref 26.0–34.0)
MCHC: 27.6 g/dL — ABNORMAL LOW (ref 30.0–36.0)
MCV: 66.9 fL — ABNORMAL LOW (ref 80.0–100.0)
Monocytes Absolute: 0.5 10*3/uL (ref 0.1–1.0)
Monocytes Relative: 5 %
Neutro Abs: 6.5 10*3/uL (ref 1.7–7.7)
Neutrophils Relative %: 69 %
Platelet Count: 686 10*3/uL — ABNORMAL HIGH (ref 150–400)
RBC: 6.49 MIL/uL — ABNORMAL HIGH (ref 3.87–5.11)
RDW: 22.6 % — ABNORMAL HIGH (ref 11.5–15.5)
WBC Count: 9.4 10*3/uL (ref 4.0–10.5)
nRBC: 0 % (ref 0.0–0.2)

## 2020-02-01 NOTE — Telephone Encounter (Signed)
Scheduled per los. Declined printout  

## 2020-02-01 NOTE — Progress Notes (Signed)
Hematology and Oncology Follow Up Visit  Eileen [name] [id number] [birth year] 49 y.o. 02/01/2020 9:35 AM Eileen [name] Eileen [name], MDRichter, Eileen Munroe, MD   Principle Diagnosis: 49 year old woman with polycythemia vera diagnosed in 2017.  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   Current therapy:  Aspirin 325 mg daily.  Therapeutic phlebotomy every 4 weeks to keep her hematocrit less than 45.    Interim History: Eileen [name] returns today for a follow-up visit.  Since the last visit, she reports no major changes in her health.  She does report feet swelling and discomfort in her toes with occasional pruritus.  She associates these complaints with her increase hematocrit.  She denies any nausea, vomiting or abdominal pain.  She denies any recent hospitalizations or illnesses.  She denies any chest pain or shortness of breath.  She denies any headaches or worsening migraines.     Medications: Unchanged on review. Current Outpatient Medications  Medication Sig Dispense Refill  . albuterol (VENTOLIN HFA) 108 (90 Base) MCG/ACT inhaler Inhale 2 puffs into the lungs every 4 (four) hours as needed for wheezing or shortness of breath. (Patient not taking: Reported on 08/03/2019) 18 g 0  . alum & mag hydroxide-simeth (MAALOX ADVANCED MAX ST) 400-400-40 MG/5ML suspension Take 10 mLs by mouth every 6 (six) hours as needed for indigestion. 355 mL 0  . aspirin EC 325 MG tablet Take 1 tablet (325 mg total) by mouth daily. (Patient not taking: Reported on 08/03/2019) 30 tablet 3  . butalbital-aspirin-caffeine (FIORINAL) 50-325-40 MG capsule Take 1 capsule by mouth 2 (two) times daily as needed for headache.    . Diclofenac Potassium 50 MG PACK Take 50 mg by mouth once as needed. For migraine. (Patient not taking: Reported on 08/03/2019) 9 each 11  . famotidine (PEPCID) 40 MG tablet Take 1 tablet (40 mg total) by mouth daily. 30 tablet 0  . fluticasone (FLONASE) 50 MCG/ACT nasal spray Place 1 spray into both  nostrils daily. (Patient not taking: Reported on 08/03/2019) 16 g 0  . loratadine (CLARITIN) 10 MG tablet Take 1 tablet (10 mg total) by mouth daily. (Patient not taking: Reported on 08/03/2019) 30 tablet 0  . prochlorperazine (COMPAZINE) 5 MG tablet Take 1 tablet (5 mg total) by mouth every 8 (eight) hours as needed for nausea. (Patient not taking: Reported on 08/03/2019) 30 tablet 3  . PROPRANOLOL HCL PO Take by mouth.    . rizatriptan (MAXALT) 10 MG tablet Take 1 tablet (10 mg total) by mouth as needed for migraine. May repeat in 2 hours if needed (Patient not taking: Reported on 08/03/2019) 10 tablet 11  . Topiramate ER (QUDEXY XR) 150 MG CS24 sprinkle capsule Take 150 mg by mouth at bedtime. (Patient not taking: Reported on 08/03/2019) 30 each 11  . traMADol (ULTRAM) 50 MG tablet Take 1 tablet (50 mg total) by mouth every 6 (six) hours as needed for moderate pain. (Patient not taking: Reported on 08/03/2019) 30 tablet 0   No current facility-administered medications for this visit.   Facility-Administered Medications Ordered in Other Visits  Medication Dose Route Frequency Provider Last Rate Last Admin  . gadopentetate dimeglumine (MAGNEVIST) injection 18 mL  18 mL Intravenous Once PRN Melvenia Beam, MD         Allergies: No Known Allergies    Physical Exam:   Blood pressure 124/83, pulse 95, temperature (!) 97.5 F (36.4 C), temperature source Tympanic, resp. rate 18, height 5\' 3"  (1.6 m), weight 227  lb 14.4 oz (103.4 kg), SpO2 100 %.      ECOG: 0    General appearance: Alert, awake without any distress. Head: Atraumatic without abnormalities Oropharynx: Without any thrush or ulcers. Eyes: No scleral icterus. Lymph nodes: No lymphadenopathy noted in the cervical, supraclavicular, or axillary nodes Heart:regular rate and rhythm, without any murmurs or gallops.   Lung: Clear to auscultation without any rhonchi, wheezes or dullness to percussion. Abdomin: Soft, nontender  without any shifting dullness or ascites. Musculoskeletal: No clubbing or cyanosis. Neurological: No motor or sensory deficits. Skin: No rashes or lesions.       Lab Results: Lab Results  Component Value Date   WBC 7.7 12/31/2019   HGB 11.7 (L) 12/31/2019   HCT 41.6 12/31/2019   MCV 67.6 (L) 12/31/2019   PLT 503 (H) 12/31/2019     Chemistry      Component Value Date/Time   NA 139 12/06/2019 1239   NA 140 02/20/2016 1432   K 3.9 12/06/2019 1239   K 4.1 02/20/2016 1432   CL 104 12/06/2019 1239   CO2 25 12/06/2019 1239   CO2 25 02/20/2016 1432   BUN 10 12/06/2019 1239   BUN 11.1 02/20/2016 1432   CREATININE 0.91 12/06/2019 1239   CREATININE 1.0 02/20/2016 1432      Component Value Date/Time   CALCIUM 9.6 12/06/2019 1239   CALCIUM 9.8 02/20/2016 1432   ALKPHOS 87 12/06/2019 1751   ALKPHOS 115 02/20/2016 1432   AST 20 12/06/2019 1751   AST 13 02/20/2016 1432   ALT 17 12/06/2019 1751   ALT 13 02/20/2016 1432   BILITOT 0.7 12/06/2019 1751   BILITOT 0.71 02/20/2016 1432      Impression and Plan:  49 year old woman with:  1. Polycythemia vera diagnosed in 2017.  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  The natural course of her disease reviewed today and treatment options were reiterated.  She has been receiving therapeutic phlebotomy which has been successful in treating her increased hematocrits.  She did develop iron deficiency anemia however.  Risks and benefits of continuing phlebotomy versus using hydroxyurea were discussed today.  I recommended continuing phlebotomy which we will schedule for next month.  Her hematocrit is close to the target and likely will require it with the next visit.   2. Thrombosis prophylaxis: Continues to be on aspirin without any issues at this time.  3. Cytoreductive therapy: White cell count has been within normal range with platelets are mildly elevated.  An option to control these counts would be hydroxyurea if her counts  continue to increase.  For the time being we will hold off on using this medication.  4. Migraine headaches: Reasonably controlled at this time.  5.  Iron deficiency: Risks and benefits of oral iron replacement were reviewed today.  Elevation of her hemoglobin and hematocrit would be a consequence at this time.  I have recommended iron replacement therapy orally for the time being.  6. Follow-up: She will continue to follow monthly and receive phlebotomy for hematocrit of 44 or higher.  She will have MD follow-up in 3 months.  30  minutes were dedicated to this encounter today.  The time was spent on reviewing laboratory data, disease status update and future plan of care discussion.   Zola Button, MD 8/24/20219:35 AM

## 2020-02-21 ENCOUNTER — Other Ambulatory Visit: Payer: Self-pay | Admitting: *Deleted

## 2020-02-21 DIAGNOSIS — D45 Polycythemia vera: Secondary | ICD-10-CM

## 2020-02-22 ENCOUNTER — Other Ambulatory Visit: Payer: Self-pay

## 2020-02-22 ENCOUNTER — Inpatient Hospital Stay: Payer: No Typology Code available for payment source

## 2020-02-22 ENCOUNTER — Inpatient Hospital Stay: Payer: No Typology Code available for payment source | Attending: Oncology

## 2020-02-22 DIAGNOSIS — D45 Polycythemia vera: Secondary | ICD-10-CM

## 2020-02-22 DIAGNOSIS — E611 Iron deficiency: Secondary | ICD-10-CM | POA: Insufficient documentation

## 2020-02-22 LAB — CBC WITH DIFFERENTIAL (CANCER CENTER ONLY)
Abs Immature Granulocytes: 0.06 10*3/uL (ref 0.00–0.07)
Basophils Absolute: 0.3 10*3/uL — ABNORMAL HIGH (ref 0.0–0.1)
Basophils Relative: 3 %
Eosinophils Absolute: 0.6 10*3/uL — ABNORMAL HIGH (ref 0.0–0.5)
Eosinophils Relative: 6 %
HCT: 43.7 % (ref 36.0–46.0)
Hemoglobin: 12.3 g/dL (ref 12.0–15.0)
Immature Granulocytes: 1 %
Lymphocytes Relative: 18 %
Lymphs Abs: 1.7 10*3/uL (ref 0.7–4.0)
MCH: 18.9 pg — ABNORMAL LOW (ref 26.0–34.0)
MCHC: 28.1 g/dL — ABNORMAL LOW (ref 30.0–36.0)
MCV: 67.1 fL — ABNORMAL LOW (ref 80.0–100.0)
Monocytes Absolute: 0.6 10*3/uL (ref 0.1–1.0)
Monocytes Relative: 6 %
Neutro Abs: 6.7 10*3/uL (ref 1.7–7.7)
Neutrophils Relative %: 66 %
Platelet Count: 687 10*3/uL — ABNORMAL HIGH (ref 150–400)
RBC: 6.51 MIL/uL — ABNORMAL HIGH (ref 3.87–5.11)
RDW: 22.5 % — ABNORMAL HIGH (ref 11.5–15.5)
WBC Count: 9.8 10*3/uL (ref 4.0–10.5)
nRBC: 0 % (ref 0.0–0.2)

## 2020-02-22 LAB — IRON AND TIBC
Iron: 17 ug/dL — ABNORMAL LOW (ref 41–142)
Saturation Ratios: 5 % — ABNORMAL LOW (ref 21–57)
TIBC: 349 ug/dL (ref 236–444)
UIBC: 332 ug/dL (ref 120–384)

## 2020-02-22 LAB — FERRITIN: Ferritin: 4 ng/mL — ABNORMAL LOW (ref 11–307)

## 2020-02-22 NOTE — Progress Notes (Signed)
Per Dr. Hazeline Junker note, therapeutic phlebotomy  To be done every 4 weeks to keep her hematocrit less than 45. Patient hematocrit today was 43.7. Patient informed that no phlebotomy needed and given lab work.

## 2020-03-06 ENCOUNTER — Other Ambulatory Visit (HOSPITAL_COMMUNITY): Payer: No Typology Code available for payment source

## 2020-03-09 ENCOUNTER — Ambulatory Visit: Admit: 2020-03-09 | Payer: No Typology Code available for payment source | Admitting: Surgery

## 2020-03-09 SURGERY — LAPAROSCOPIC CHOLECYSTECTOMY
Anesthesia: General

## 2020-03-21 ENCOUNTER — Other Ambulatory Visit: Payer: Self-pay | Admitting: Oncology

## 2020-03-21 ENCOUNTER — Inpatient Hospital Stay: Payer: No Typology Code available for payment source

## 2020-03-21 ENCOUNTER — Other Ambulatory Visit: Payer: Self-pay

## 2020-03-21 ENCOUNTER — Inpatient Hospital Stay: Payer: No Typology Code available for payment source | Attending: Oncology

## 2020-03-21 ENCOUNTER — Other Ambulatory Visit: Payer: Self-pay | Admitting: Hematology

## 2020-03-21 DIAGNOSIS — D45 Polycythemia vera: Secondary | ICD-10-CM | POA: Diagnosis present

## 2020-03-21 DIAGNOSIS — D500 Iron deficiency anemia secondary to blood loss (chronic): Secondary | ICD-10-CM

## 2020-03-21 LAB — CBC WITH DIFFERENTIAL (CANCER CENTER ONLY)
Abs Immature Granulocytes: 0.05 10*3/uL (ref 0.00–0.07)
Basophils Absolute: 0.3 10*3/uL — ABNORMAL HIGH (ref 0.0–0.1)
Basophils Relative: 3 %
Eosinophils Absolute: 0.6 10*3/uL — ABNORMAL HIGH (ref 0.0–0.5)
Eosinophils Relative: 6 %
HCT: 45.2 % (ref 36.0–46.0)
Hemoglobin: 12.4 g/dL (ref 12.0–15.0)
Immature Granulocytes: 1 %
Lymphocytes Relative: 17 %
Lymphs Abs: 1.7 10*3/uL (ref 0.7–4.0)
MCH: 18.4 pg — ABNORMAL LOW (ref 26.0–34.0)
MCHC: 27.4 g/dL — ABNORMAL LOW (ref 30.0–36.0)
MCV: 67.2 fL — ABNORMAL LOW (ref 80.0–100.0)
Monocytes Absolute: 0.5 10*3/uL (ref 0.1–1.0)
Monocytes Relative: 5 %
Neutro Abs: 6.9 10*3/uL (ref 1.7–7.7)
Neutrophils Relative %: 68 %
Platelet Count: 631 10*3/uL — ABNORMAL HIGH (ref 150–400)
RBC: 6.73 MIL/uL — ABNORMAL HIGH (ref 3.87–5.11)
RDW: 23 % — ABNORMAL HIGH (ref 11.5–15.5)
WBC Count: 10 10*3/uL (ref 4.0–10.5)
nRBC: 0 % (ref 0.0–0.2)

## 2020-03-21 LAB — IRON AND TIBC
Iron: 15 ug/dL — ABNORMAL LOW (ref 41–142)
Saturation Ratios: 4 % — ABNORMAL LOW (ref 21–57)
TIBC: 340 ug/dL (ref 236–444)
UIBC: 325 ug/dL (ref 120–384)

## 2020-03-21 LAB — FERRITIN: Ferritin: <4 ng/mL — ABNORMAL LOW (ref 11–307)

## 2020-03-21 NOTE — Patient Instructions (Signed)
Therapeutic Phlebotomy Therapeutic phlebotomy is the planned removal of blood from a person's body for the purpose of treating a medical condition. The procedure is similar to donating blood. Usually, about a pint (470 mL, or 0.47 L) of blood is removed. The average adult has 9-12 pints (4.3-5.7 L) of blood in the body. Therapeutic phlebotomy may be used to treat the following medical conditions:  Hemochromatosis. This is a condition in which the blood contains too much iron.  Polycythemia vera. This is a condition in which the blood contains too many red blood cells.  Porphyria cutanea tarda. This is a disease in which an important part of hemoglobin is not made properly. It results in the buildup of abnormal amounts of porphyrins in the body.  Sickle cell disease. This is a condition in which the red blood cells form an abnormal crescent shape rather than a round shape. Tell a health care provider about:  Any allergies you have.  All medicines you are taking, including vitamins, herbs, eye drops, creams, and over-the-counter medicines.  Any problems you or family members have had with anesthetic medicines.  Any blood disorders you have.  Any surgeries you have had.  Any medical conditions you have.  Whether you are pregnant or may be pregnant. What are the risks? Generally, this is a safe procedure. However, problems may occur, including:  Nausea or light-headedness.  Low blood pressure (hypotension).  Soreness, bleeding, swelling, or bruising at the needle insertion site.  Infection. What happens before the procedure?  Follow instructions from your health care provider about eating or drinking restrictions.  Ask your health care provider about: ? Changing or stopping your regular medicines. This is especially important if you are taking diabetes medicines or blood thinners (anticoagulants). ? Taking medicines such as aspirin and ibuprofen. These medicines can thin your  blood. Do not take these medicines unless your health care provider tells you to take them. ? Taking over-the-counter medicines, vitamins, herbs, and supplements.  Wear clothing with sleeves that can be raised above the elbow.  Plan to have someone take you home from the hospital or clinic.  You may have a blood sample taken.  Your blood pressure, pulse rate, and breathing rate will be measured. What happens during the procedure?   To lower your risk of infection: ? Your health care team will wash or sanitize their hands. ? Your skin will be cleaned with an antiseptic.  You may be given a medicine to numb the area (local anesthetic).  A tourniquet will be placed on your arm.  A needle will be inserted into one of your veins.  Tubing and a collection bag will be attached to that needle.  Blood will flow through the needle and tubing into the collection bag.  The collection bag will be placed lower than your arm to allow gravity to help the flow of blood into the bag.  You may be asked to open and close your hand slowly and continually during the entire collection.  After the specified amount of blood has been removed from your body, the collection bag and tubing will be clamped.  The needle will be removed from your vein.  Pressure will be held on the site of the needle insertion to stop the bleeding.  A bandage (dressing) will be placed over the needle insertion site. The procedure may vary among health care providers and hospitals. What happens after the procedure?  Your blood pressure, pulse rate, and breathing rate will be   measured after the procedure.  You will be encouraged to drink fluids.  Your recovery will be assessed and monitored.  You can return to your normal activities as told by your health care provider. Summary  Therapeutic phlebotomy is the planned removal of blood from a person's body for the purpose of treating a medical condition.  Therapeutic  phlebotomy may be used to treat hemochromatosis, polycythemia vera, porphyria cutanea tarda, or sickle cell disease.  In the procedure, a needle is inserted and about a pint (470 mL, or 0.47 L) of blood is removed. The average adult has 9-12 pints (4.3-5.7 L) of blood in the body.  This is generally a safe procedure, but it can sometimes cause problems such as nausea, light-headedness, or low blood pressure (hypotension). This information is not intended to replace advice given to you by your health care provider. Make sure you discuss any questions you have with your health care provider. Document Revised: 06/12/2017 Document Reviewed: 06/12/2017 Elsevier Patient Education  2020 [employer]  

## 2020-04-18 ENCOUNTER — Inpatient Hospital Stay: Payer: No Typology Code available for payment source

## 2020-04-18 ENCOUNTER — Inpatient Hospital Stay: Payer: No Typology Code available for payment source | Attending: Oncology

## 2020-04-18 ENCOUNTER — Other Ambulatory Visit: Payer: Self-pay | Admitting: Surgery

## 2020-05-12 ENCOUNTER — Inpatient Hospital Stay: Payer: No Typology Code available for payment source

## 2020-05-12 ENCOUNTER — Other Ambulatory Visit: Payer: Self-pay

## 2020-05-12 ENCOUNTER — Inpatient Hospital Stay: Payer: No Typology Code available for payment source | Attending: Oncology | Admitting: Oncology

## 2020-05-12 VITALS — BP 121/92 | HR 85 | Resp 19 | Ht 63.0 in | Wt 219.7 lb

## 2020-05-12 DIAGNOSIS — D45 Polycythemia vera: Secondary | ICD-10-CM

## 2020-05-12 DIAGNOSIS — E611 Iron deficiency: Secondary | ICD-10-CM | POA: Insufficient documentation

## 2020-05-12 DIAGNOSIS — Z7982 Long term (current) use of aspirin: Secondary | ICD-10-CM | POA: Diagnosis not present

## 2020-05-12 DIAGNOSIS — G43909 Migraine, unspecified, not intractable, without status migrainosus: Secondary | ICD-10-CM | POA: Diagnosis not present

## 2020-05-12 DIAGNOSIS — M79606 Pain in leg, unspecified: Secondary | ICD-10-CM | POA: Insufficient documentation

## 2020-05-12 LAB — CBC WITH DIFFERENTIAL (CANCER CENTER ONLY)
Abs Immature Granulocytes: 0.07 10*3/uL (ref 0.00–0.07)
Basophils Absolute: 0.2 10*3/uL — ABNORMAL HIGH (ref 0.0–0.1)
Basophils Relative: 3 %
Eosinophils Absolute: 0.4 10*3/uL (ref 0.0–0.5)
Eosinophils Relative: 5 %
HCT: 44.5 % (ref 36.0–46.0)
Hemoglobin: 12.4 g/dL (ref 12.0–15.0)
Immature Granulocytes: 1 %
Lymphocytes Relative: 19 %
Lymphs Abs: 1.6 10*3/uL (ref 0.7–4.0)
MCH: 18.7 pg — ABNORMAL LOW (ref 26.0–34.0)
MCHC: 27.9 g/dL — ABNORMAL LOW (ref 30.0–36.0)
MCV: 67.2 fL — ABNORMAL LOW (ref 80.0–100.0)
Monocytes Absolute: 0.5 10*3/uL (ref 0.1–1.0)
Monocytes Relative: 6 %
Neutro Abs: 5.8 10*3/uL (ref 1.7–7.7)
Neutrophils Relative %: 66 %
Platelet Count: 564 10*3/uL — ABNORMAL HIGH (ref 150–400)
RBC: 6.62 MIL/uL — ABNORMAL HIGH (ref 3.87–5.11)
RDW: 22.5 % — ABNORMAL HIGH (ref 11.5–15.5)
WBC Count: 8.7 10*3/uL (ref 4.0–10.5)
nRBC: 0 % (ref 0.0–0.2)

## 2020-05-12 NOTE — Progress Notes (Signed)
HCT 44.5 resulted this am. Per physician parameter, proceed with phlebotomy.- TPR Phlebotomy attempted twice today. Pt states it was "too painful, felt like a nerve". Below 168ml total amt blood removed per second phlebotomy. Pt requested to not be poked again and rescheduled. Note sent to scheduler. Pt to come back next week.- TPR

## 2020-05-12 NOTE — Progress Notes (Signed)
Hematology and Oncology Follow Up Visit  Eileen [name] [id number] 1971/06/02 49 y.o. 05/12/2020 9:37 AM Eileen [name] Eileen [name], MDRichter, Eileen Munroe, MD   Principle Diagnosis: 49 year old woman with JAK2 positive polycythemia vera diagnosed in 2017.  S   Current therapy:  Aspirin 325 mg daily.  Therapeutic phlebotomy every 4 weeks to keep her hematocrit less than 45.    Interim History: Ms. Eileen [name] presents today for return evaluation.  Since the last visit, she is not complaining of increased symptoms in her lower extremity and occasional headaches.  She denies any nausea, vomiting or thrombosis episode.  Denies any excessive fatigue or tiredness.     Medications: Unchanged on review. Current Outpatient Medications  Medication Sig Dispense Refill  . albuterol (VENTOLIN HFA) 108 (90 Base) MCG/ACT inhaler Inhale 2 puffs into the lungs every 4 (four) hours as needed for wheezing or shortness of breath. (Patient not taking: Reported on 08/03/2019) 18 g 0  . alum & mag hydroxide-simeth (MAALOX ADVANCED MAX ST) 400-400-40 MG/5ML suspension Take 10 mLs by mouth every 6 (six) hours as needed for indigestion. (Patient not taking: Reported on 02/01/2020) 355 mL 0  . aspirin EC 325 MG tablet Take 1 tablet (325 mg total) by mouth daily. (Patient not taking: Reported on 08/03/2019) 30 tablet 3  . butalbital-aspirin-caffeine (FIORINAL) 50-325-40 MG capsule Take 1 capsule by mouth 2 (two) times daily as needed for headache. (Patient not taking: Reported on 02/01/2020)    . Diclofenac Potassium 50 MG PACK Take 50 mg by mouth once as needed. For migraine. (Patient not taking: Reported on 08/03/2019) 9 each 11  . famotidine (PEPCID) 40 MG tablet Take 1 tablet (40 mg total) by mouth daily. 30 tablet 0  . fluticasone (FLONASE) 50 MCG/ACT nasal spray Place 1 spray into both nostrils daily. (Patient not taking: Reported on 08/03/2019) 16 g 0  . loratadine (CLARITIN) 10 MG tablet Take 1 tablet (10 mg total) by mouth  daily. (Patient not taking: Reported on 08/03/2019) 30 tablet 0  . prochlorperazine (COMPAZINE) 5 MG tablet Take 1 tablet (5 mg total) by mouth every 8 (eight) hours as needed for nausea. (Patient not taking: Reported on 08/03/2019) 30 tablet 3  . PROPRANOLOL HCL PO Take by mouth. (Patient not taking: Reported on 02/01/2020)    . rizatriptan (MAXALT) 10 MG tablet Take 1 tablet (10 mg total) by mouth as needed for migraine. May repeat in 2 hours if needed (Patient not taking: Reported on 08/03/2019) 10 tablet 11  . Topiramate ER (QUDEXY XR) 150 MG CS24 sprinkle capsule Take 150 mg by mouth at bedtime. (Patient not taking: Reported on 08/03/2019) 30 each 11  . traMADol (ULTRAM) 50 MG tablet Take 1 tablet (50 mg total) by mouth every 6 (six) hours as needed for moderate pain. (Patient not taking: Reported on 08/03/2019) 30 tablet 0   No current facility-administered medications for this visit.   Facility-Administered Medications Ordered in Other Visits  Medication Dose Route Frequency Provider Last Rate Last Admin  . gadopentetate dimeglumine (MAGNEVIST) injection 18 mL  18 mL Intravenous Once PRN Melvenia Beam, MD         Allergies: No Known Allergies    Physical Exam:   Blood pressure (!) 121/92, pulse 85, resp. rate 19, height 5\' 3"  (1.6 m), weight 219 lb 11.2 oz (99.7 kg), SpO2 100 %.      ECOG: 0    General appearance: Comfortable appearing without any discomfort Head: Normocephalic without any trauma Oropharynx: Mucous  membranes are moist and pink without any thrush or ulcers. Eyes: Pupils are equal and round reactive to light. Lymph nodes: No cervical, supraclavicular, inguinal or axillary lymphadenopathy.   Heart:regular rate and rhythm.  S1 and S2 without leg edema. Lung: Clear without any rhonchi or wheezes.  No dullness to percussion. Abdomin: Soft, nontender, nondistended with good bowel sounds.  No hepatosplenomegaly. Musculoskeletal: No joint deformity or effusion.   Full range of motion noted. Neurological: No deficits noted on motor, sensory and deep tendon reflex exam. Skin: No petechial rash or dryness.  Appeared moist.          Lab Results: Lab Results  Component Value Date   WBC 8.7 05/12/2020   HGB 12.4 05/12/2020   HCT 44.5 05/12/2020   MCV 67.2 (L) 05/12/2020   PLT 564 (H) 05/12/2020     Chemistry      Component Value Date/Time   NA 139 12/06/2019 1239   NA 140 02/20/2016 1432   K 3.9 12/06/2019 1239   K 4.1 02/20/2016 1432   CL 104 12/06/2019 1239   CO2 25 12/06/2019 1239   CO2 25 02/20/2016 1432   BUN 10 12/06/2019 1239   BUN 11.1 02/20/2016 1432   CREATININE 0.91 12/06/2019 1239   CREATININE 1.0 02/20/2016 1432      Component Value Date/Time   CALCIUM 9.6 12/06/2019 1239   CALCIUM 9.8 02/20/2016 1432   ALKPHOS 87 12/06/2019 1751   ALKPHOS 115 02/20/2016 1432   AST 20 12/06/2019 1751   AST 13 02/20/2016 1432   ALT 17 12/06/2019 1751   ALT 13 02/20/2016 1432   BILITOT 0.7 12/06/2019 1751   BILITOT 0.71 02/20/2016 1432      Impression and Plan:  49 year old woman with:  1.  JAK2 positive polycythemia vera diagnosed in 2017.    She is more symptomatic at this time from her polycythemia with symptoms that include headaches and lower extremity pain.  Risks and benefits of proceeding with phlebotomy was reviewed today.  Her hematocrit is close to target but she is symptomatic and will proceed with phlebotomy at this time.   2. Thrombosis prophylaxis: I recommended continuing aspirin at this time.  3. Cytoreductive therapy: Hydroxyurea option has been deferred at this time.  4. Migraine headaches: She is having more headaches as of late which could be related to polycythemia.  Phlebotomy might help at this time.  5.  Iron deficiency: Iron studies are currently pending and will replace as needed.  6. Follow-up: Monthly for a phlebotomy to get her hematocrit at the lower range and will have MD follow-up in 3  months.  30  minutes were spent on this visit.  The time was dedicated to reviewing disease status, reviewing laboratory data outlining future plan of care.   Zola Button, MD 12/3/20219:37 AM

## 2020-06-10 DIAGNOSIS — U071 COVID-19: Secondary | ICD-10-CM

## 2020-06-10 HISTORY — DX: COVID-19: U07.1

## 2020-06-16 ENCOUNTER — Inpatient Hospital Stay: Payer: No Typology Code available for payment source

## 2020-06-16 ENCOUNTER — Other Ambulatory Visit: Payer: Self-pay

## 2020-06-16 ENCOUNTER — Inpatient Hospital Stay: Payer: No Typology Code available for payment source | Attending: Oncology

## 2020-06-16 ENCOUNTER — Encounter: Payer: Self-pay | Admitting: *Deleted

## 2020-06-16 DIAGNOSIS — D45 Polycythemia vera: Secondary | ICD-10-CM | POA: Insufficient documentation

## 2020-06-16 DIAGNOSIS — D500 Iron deficiency anemia secondary to blood loss (chronic): Secondary | ICD-10-CM

## 2020-06-16 LAB — CBC WITH DIFFERENTIAL (CANCER CENTER ONLY)
Abs Immature Granulocytes: 0.06 10*3/uL (ref 0.00–0.07)
Basophils Absolute: 0.2 10*3/uL — ABNORMAL HIGH (ref 0.0–0.1)
Basophils Relative: 3 %
Eosinophils Absolute: 0.6 10*3/uL — ABNORMAL HIGH (ref 0.0–0.5)
Eosinophils Relative: 6 %
HCT: 43.3 % (ref 36.0–46.0)
Hemoglobin: 12.1 g/dL (ref 12.0–15.0)
Immature Granulocytes: 1 %
Lymphocytes Relative: 21 %
Lymphs Abs: 2 10*3/uL (ref 0.7–4.0)
MCH: 18.9 pg — ABNORMAL LOW (ref 26.0–34.0)
MCHC: 27.9 g/dL — ABNORMAL LOW (ref 30.0–36.0)
MCV: 67.6 fL — ABNORMAL LOW (ref 80.0–100.0)
Monocytes Absolute: 0.5 10*3/uL (ref 0.1–1.0)
Monocytes Relative: 5 %
Neutro Abs: 6.1 10*3/uL (ref 1.7–7.7)
Neutrophils Relative %: 64 %
Platelet Count: 597 10*3/uL — ABNORMAL HIGH (ref 150–400)
RBC: 6.41 MIL/uL — ABNORMAL HIGH (ref 3.87–5.11)
RDW: 22.5 % — ABNORMAL HIGH (ref 11.5–15.5)
WBC Count: 9.4 10*3/uL (ref 4.0–10.5)
nRBC: 0 % (ref 0.0–0.2)

## 2020-06-16 LAB — IRON AND TIBC
Iron: 21 ug/dL — ABNORMAL LOW (ref 41–142)
Saturation Ratios: 6 % — ABNORMAL LOW (ref 21–57)
TIBC: 341 ug/dL (ref 236–444)
UIBC: 321 ug/dL (ref 120–384)

## 2020-06-16 LAB — FERRITIN: Ferritin: <4 ng/mL — ABNORMAL LOW (ref 11–307)

## 2020-06-16 NOTE — Progress Notes (Signed)
Per office note, goal to keep hct below 45.  HCT 43.3 today, treatment not indicated.  SW pt in lobby.  Pt has no complaints.  Pt d/c'd to home.

## 2020-07-07 ENCOUNTER — Other Ambulatory Visit: Payer: Self-pay

## 2020-07-07 ENCOUNTER — Other Ambulatory Visit (HOSPITAL_COMMUNITY): Payer: Self-pay | Admitting: Surgery

## 2020-07-07 ENCOUNTER — Ambulatory Visit (HOSPITAL_COMMUNITY)
Admission: RE | Admit: 2020-07-07 | Discharge: 2020-07-07 | Disposition: A | Discharge status: Home / Self Care | Payer: No Typology Code available for payment source | Source: Ambulatory Visit | Attending: Surgery | Admitting: Surgery

## 2020-07-07 ENCOUNTER — Other Ambulatory Visit: Payer: Self-pay | Admitting: Surgery

## 2020-07-07 DIAGNOSIS — R1011 Right upper quadrant pain: Secondary | ICD-10-CM

## 2020-07-07 MED ORDER — IOHEXOL 300 MG/ML  SOLN
100.0000 mL | Freq: Once | INTRAMUSCULAR | Status: AC | PRN
  Administered 2020-07-07: 100 mL via INTRAVENOUS

## 2020-07-10 ENCOUNTER — Encounter (HOSPITAL_COMMUNITY): Payer: Self-pay

## 2020-07-10 ENCOUNTER — Observation Stay (HOSPITAL_COMMUNITY)
Admission: EM | Admit: 2020-07-10 | Discharge: 2020-07-11 | Disposition: A | Discharge status: Home / Self Care | Payer: No Typology Code available for payment source | Attending: Internal Medicine | Admitting: Internal Medicine

## 2020-07-10 ENCOUNTER — Emergency Department (HOSPITAL_COMMUNITY): Payer: No Typology Code available for payment source

## 2020-07-10 ENCOUNTER — Other Ambulatory Visit: Payer: Self-pay

## 2020-07-10 DIAGNOSIS — Z8711 Personal history of peptic ulcer disease: Secondary | ICD-10-CM | POA: Insufficient documentation

## 2020-07-10 DIAGNOSIS — K8500 Idiopathic acute pancreatitis without necrosis or infection: Secondary | ICD-10-CM | POA: Diagnosis not present

## 2020-07-10 DIAGNOSIS — Z7982 Long term (current) use of aspirin: Secondary | ICD-10-CM | POA: Diagnosis not present

## 2020-07-10 DIAGNOSIS — R109 Unspecified abdominal pain: Secondary | ICD-10-CM | POA: Diagnosis present

## 2020-07-10 DIAGNOSIS — R933 Abnormal findings on diagnostic imaging of other parts of digestive tract: Secondary | ICD-10-CM | POA: Diagnosis not present

## 2020-07-10 DIAGNOSIS — R1013 Epigastric pain: Secondary | ICD-10-CM | POA: Diagnosis not present

## 2020-07-10 DIAGNOSIS — K2950 Unspecified chronic gastritis without bleeding: Secondary | ICD-10-CM | POA: Diagnosis not present

## 2020-07-10 DIAGNOSIS — Z20822 Contact with and (suspected) exposure to covid-19: Secondary | ICD-10-CM | POA: Insufficient documentation

## 2020-07-10 DIAGNOSIS — K3189 Other diseases of stomach and duodenum: Secondary | ICD-10-CM | POA: Diagnosis not present

## 2020-07-10 DIAGNOSIS — D45 Polycythemia vera: Secondary | ICD-10-CM | POA: Insufficient documentation

## 2020-07-10 DIAGNOSIS — K269 Duodenal ulcer, unspecified as acute or chronic, without hemorrhage or perforation: Secondary | ICD-10-CM | POA: Diagnosis not present

## 2020-07-10 DIAGNOSIS — R1010 Upper abdominal pain, unspecified: Secondary | ICD-10-CM

## 2020-07-10 DIAGNOSIS — K2980 Duodenitis without bleeding: Secondary | ICD-10-CM

## 2020-07-10 DIAGNOSIS — K219 Gastro-esophageal reflux disease without esophagitis: Secondary | ICD-10-CM | POA: Insufficient documentation

## 2020-07-10 LAB — LIPASE, BLOOD: Lipase: 30 U/L (ref 11–51)

## 2020-07-10 LAB — COMPREHENSIVE METABOLIC PANEL
ALT: 10 U/L (ref 0–44)
AST: 12 U/L — ABNORMAL LOW (ref 15–41)
Albumin: 4.6 g/dL (ref 3.5–5.0)
Alkaline Phosphatase: 88 U/L (ref 38–126)
Anion gap: 14 (ref 5–15)
BUN: 17 mg/dL (ref 6–20)
CO2: 19 mmol/L — ABNORMAL LOW (ref 22–32)
Calcium: 9.1 mg/dL (ref 8.9–10.3)
Chloride: 105 mmol/L (ref 98–111)
Creatinine, Ser: 0.9 mg/dL (ref 0.44–1.00)
GFR, Estimated: >60 mL/min (ref >60)
Glucose, Bld: 102 mg/dL — ABNORMAL HIGH (ref 70–99)
Potassium: 3.6 mmol/L (ref 3.5–5.1)
Sodium: 138 mmol/L (ref 135–145)
Total Bilirubin: 0.8 mg/dL (ref 0.3–1.2)
Total Protein: 7.7 g/dL (ref 6.5–8.1)

## 2020-07-10 LAB — CBC
HCT: 50.4 % — ABNORMAL HIGH (ref 36.0–46.0)
Hemoglobin: 14 g/dL (ref 12.0–15.0)
MCH: 19.2 pg — ABNORMAL LOW (ref 26.0–34.0)
MCHC: 27.8 g/dL — ABNORMAL LOW (ref 30.0–36.0)
MCV: 69.1 fL — ABNORMAL LOW (ref 80.0–100.0)
Platelets: 845 10*3/uL — ABNORMAL HIGH (ref 150–400)
RBC: 7.29 MIL/uL — ABNORMAL HIGH (ref 3.87–5.11)
RDW: 23.4 % — ABNORMAL HIGH (ref 11.5–15.5)
WBC: 15.6 10*3/uL — ABNORMAL HIGH (ref 4.0–10.5)
nRBC: 0 % (ref 0.0–0.2)

## 2020-07-10 LAB — URINALYSIS, ROUTINE W REFLEX MICROSCOPIC
Bacteria, UA: NONE SEEN
Bilirubin Urine: NEGATIVE
Glucose, UA: NEGATIVE mg/dL
Hgb urine dipstick: NEGATIVE
Ketones, ur: 80 mg/dL — AB
Leukocytes,Ua: NEGATIVE
Nitrite: NEGATIVE
Protein, ur: 30 mg/dL — AB
Specific Gravity, Urine: 1.03 (ref 1.005–1.030)
pH: 5 (ref 5.0–8.0)

## 2020-07-10 LAB — I-STAT BETA HCG BLOOD, ED (MC, WL, AP ONLY): I-stat hCG, quantitative: 8.4 m[IU]/mL — ABNORMAL HIGH (ref <5)

## 2020-07-10 LAB — HIV ANTIBODY (ROUTINE TESTING W REFLEX): HIV Screen 4th Generation wRfx: NONREACTIVE

## 2020-07-10 LAB — SARS CORONAVIRUS 2 BY RT PCR (HOSPITAL ORDER, PERFORMED IN ~~LOC~~ HOSPITAL LAB): SARS Coronavirus 2: NEGATIVE

## 2020-07-10 MED ORDER — SODIUM CHLORIDE 0.9 % IV SOLN
1000.0000 mL | INTRAVENOUS | Status: DC
  Administered 2020-07-10 – 2020-07-11 (×2): 1000 mL via INTRAVENOUS

## 2020-07-10 MED ORDER — ENOXAPARIN SODIUM 40 MG/0.4ML ~~LOC~~ SOLN
40.0000 mg | SUBCUTANEOUS | Status: DC
  Administered 2020-07-10: 40 mg via SUBCUTANEOUS
  Filled 2020-07-10: qty 0.4

## 2020-07-10 MED ORDER — ONDANSETRON HCL 4 MG/2ML IJ SOLN: 4.0000 mg | Freq: Four times a day (QID) | INTRAMUSCULAR | Status: DC | PRN

## 2020-07-10 MED ORDER — ONDANSETRON HCL 4 MG/2ML IJ SOLN
4.0000 mg | Freq: Once | INTRAMUSCULAR | Status: AC
  Administered 2020-07-10: 4 mg via INTRAVENOUS
  Filled 2020-07-10: qty 2

## 2020-07-10 MED ORDER — PANTOPRAZOLE SODIUM 40 MG IV SOLR
40.0000 mg | Freq: Two times a day (BID) | INTRAVENOUS | Status: DC
  Administered 2020-07-10 – 2020-07-11 (×2): 40 mg via INTRAVENOUS
  Filled 2020-07-10 (×2): qty 40

## 2020-07-10 MED ORDER — HYDROMORPHONE HCL 1 MG/ML IJ SOLN
1.0000 mg | Freq: Once | INTRAMUSCULAR | Status: AC
  Administered 2020-07-10: 1 mg via INTRAVENOUS
  Filled 2020-07-10: qty 1

## 2020-07-10 MED ORDER — PANTOPRAZOLE SODIUM 40 MG IV SOLR
40.0000 mg | Freq: Once | INTRAVENOUS | Status: AC
  Administered 2020-07-10: 40 mg via INTRAVENOUS
  Filled 2020-07-10: qty 40

## 2020-07-10 MED ORDER — SODIUM CHLORIDE 0.9 % IV BOLUS (SEPSIS)
1000.0000 mL | Freq: Once | INTRAVENOUS | Status: AC
  Administered 2020-07-10: 1000 mL via INTRAVENOUS

## 2020-07-10 MED ORDER — HYDROMORPHONE HCL 1 MG/ML IJ SOLN
1.0000 mg | INTRAMUSCULAR | Status: DC | PRN
  Administered 2020-07-10: 1 mg via INTRAVENOUS
  Filled 2020-07-10 (×2): qty 1

## 2020-07-10 NOTE — ED Provider Notes (Signed)
[facility] DEPT Provider Note   CSN: [id number] Arrival date & time: 07/10/20  1050     History Chief Complaint  Patient presents with  . Abdominal Pain    Eileen [name] is a 50 y.o. female.  HPI    Patient presents to the ER for evaluation of persistent abdominal pain.  Patient states she started having symptoms about a week ago.  She is having pain in her upper abdomen associated with nausea and vomiting.  Patient does have history of prior cholecystectomy back in November.  Patient had been doing well since that time until the past week.  She went and made an appointment with her surgeon.  She was seen last week and had an outpatient CT scan ordered.  That was performed on the 28th.  Patient states she called the surgical office on the 29th to go over the results.  Patient was told she had some possible inflammation of the pancreas and was instructed to take fluids and Tylenol.  She spoke to her surgeon today because she is still having persistent pain, vomiting and was told to come to the ED for evaluation.  Patient states she has been vomiting every day.  She had multiple episodes of vomiting yesterday and was not able to keep much down.  She had some loose stool after the contrast but has not been having diarrhea.  She denies any blood in her emesis or stool.  Pain is severe in the upper abdomen and nothing seems to make it better.  Past Medical History:  Diagnosis Date  . Acute duodenal ulcer without mention of hemorrhage, perforation, or obstruction   . Dysrhythmia    History of palpations, for many years  . GERD (gastroesophageal reflux disease)   . Headache    takes Advil  . Polycythemia vera ([place])   . Reflux esophagitis     Patient Active Problem List   Diagnosis Date Noted  . 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 Polycythemia vera ([place]) 02/20/2016  . MORBID OBESITY 08/11/2007  . ANEMIA DUE TO  CHRONIC BLOOD LOSS 08/11/2007  . ESOPHAGITIS 08/11/2007  . GERD 08/11/2007  . Headache 08/11/2007  . Personal history of peptic ulcer disease 08/11/2007    Past Surgical History:  Procedure Laterality Date  . ABDOMINAL HYSTERECTOMY  2011ish  . CESAREAN SECTION     x 3   . ORIF ANKLE FRACTURE Left 12/21/2014   Procedure: OPEN REDUCTION INTERNAL FIXATION (ORIF) LEFT LATERAL MALLEOLUS ANKLE FRACTURE;  Surgeon: Leandrew Koyanagi, MD;  Location: [facility];  Service: Orthopedics;  Laterality: Left;  Needs RNFA  . UPPER GI ENDOSCOPY       OB History   No obstetric history on file.     Family History  Problem Relation Age of Onset  . Migraines Mother   . Breast cancer Other        Great MA   . Colon cancer Neg Hx     Social History   Tobacco Use  . Smoking status: Never Smoker  . Smokeless tobacco: Never Used  Vaping Use  . Vaping Use: Never used  Substance Use Topics  . Alcohol use: No  . Drug use: No    Home Medications Prior to Admission medications   Medication Sig Start Date End Date Taking? Authorizing Provider  albuterol (VENTOLIN HFA) 108 (90 Base) MCG/ACT inhaler Inhale 2 puffs into the lungs every 4 (four) hours as needed for wheezing  or shortness of breath. Patient not taking: Reported on 08/03/2019 06/07/19   Hall-Potvin, [place], PA-C  alum & mag hydroxide-simeth (MAALOX ADVANCED MAX ST) 400-400-40 MG/5ML suspension Take 10 mLs by mouth every 6 (six) hours as needed for indigestion. Patient not taking: Reported on 02/01/2020 12/06/19   Margette Fast, MD  aspirin EC 325 MG tablet Take 1 tablet (325 mg total) by mouth daily. Patient not taking: Reported on 08/03/2019 01/29/17   Maryanna Shape, NP  butalbital-aspirin-caffeine [facility]) [phone] MG capsule Take 1 capsule by mouth 2 (two) times daily as needed for headache. Patient not taking: Reported on 02/01/2020    [provider]  Diclofenac Potassium 50 MG PACK Take 50 mg by mouth once as needed. For  migraine. Patient not taking: Reported on 08/03/2019 04/30/17   Melvenia Beam, MD  famotidine (PEPCID) 40 MG tablet Take 1 tablet (40 mg total) by mouth daily. 12/06/19 01/05/20  Long, Wonda [place], MD  fluticasone (FLONASE) 50 MCG/ACT nasal spray Place 1 spray into both nostrils daily. Patient not taking: Reported on 08/03/2019 06/07/19   Hall-Potvin, [place], PA-C  loratadine (CLARITIN) 10 MG tablet Take 1 tablet (10 mg total) by mouth daily. Patient not taking: Reported on 08/03/2019 06/07/19   Hall-Potvin, [place], PA-C  prochlorperazine (COMPAZINE) 5 MG tablet Take 1 tablet (5 mg total) by mouth every 8 (eight) hours as needed for nausea. Patient not taking: Reported on 08/03/2019 11/28/16   Melvenia Beam, MD  PROPRANOLOL HCL PO Take by mouth. Patient not taking: Reported on 02/01/2020    [provider]  rizatriptan (MAXALT) 10 MG tablet Take 1 tablet (10 mg total) by mouth as needed for migraine. May repeat in 2 hours if needed Patient not taking: Reported on 08/03/2019 04/30/17   Melvenia Beam, MD  Topiramate ER (QUDEXY XR) 150 MG CS24 sprinkle capsule Take 150 mg by mouth at bedtime. Patient not taking: Reported on 08/03/2019 04/30/17   Melvenia Beam, MD  traMADol (ULTRAM) 50 MG tablet Take 1 tablet (50 mg total) by mouth every 6 (six) hours as needed for moderate pain. Patient not taking: Reported on 08/03/2019 01/29/17   Maryanna Shape, NP    Allergies    Patient has no known allergies.  Review of Systems   Review of Systems  All other systems reviewed and are negative.   Physical Exam Updated Vital Signs BP 119/81   Pulse 98   Temp 98.4 F (36.9 C) (Oral)   Resp 18   SpO2 100%   Physical Exam Vitals and nursing note reviewed.  Constitutional:      General: She is not in acute distress.    Appearance: She is well-developed and well-nourished.  HENT:     Head: Normocephalic and atraumatic.     Right Ear: External ear normal.     Left Ear: External ear  normal.  Eyes:     General: No scleral icterus.       Right eye: No discharge.        Left eye: No discharge.     Conjunctiva/sclera: Conjunctivae normal.  Neck:     Trachea: No tracheal deviation.  Cardiovascular:     Rate and Rhythm: Normal rate and regular rhythm.     Pulses: Intact distal pulses.  Pulmonary:     Effort: Pulmonary effort is normal. No respiratory distress.     Breath sounds: Normal breath sounds. No stridor. No wheezing or rales.  Abdominal:  General: Bowel sounds are normal. There is no distension.     Palpations: Abdomen is soft.     Tenderness: There is abdominal tenderness in the epigastric area. There is guarding. There is no rebound.  Musculoskeletal:        General: No tenderness or edema.     Cervical back: Neck supple.  Skin:    General: Skin is warm and dry.     Findings: No rash.  Neurological:     Mental Status: She is alert.     Cranial Nerves: No cranial nerve deficit (no facial droop, extraocular movements intact, no slurred speech).     Sensory: No sensory deficit.     Motor: No abnormal muscle tone or seizure activity.     Coordination: Coordination normal.     Deep Tendon Reflexes: Strength normal.  Psychiatric:        Mood and Affect: Mood and affect normal.     ED Results / Procedures / Treatments   Labs (all labs ordered are listed, but only abnormal results are displayed) Labs Reviewed  CBC - Abnormal; Notable for the following components:      Result Value   WBC 15.6 (*)    RBC 7.29 (*)    HCT 50.4 (*)    MCV 69.1 (*)    MCH 19.2 (*)    MCHC 27.8 (*)    RDW 23.4 (*)    Platelets 845 (*)    All other components within normal limits  COMPREHENSIVE METABOLIC PANEL - Abnormal; Notable for the following components:   CO2 19 (*)    Glucose, Bld 102 (*)    AST 12 (*)    All other components within normal limits  I-STAT BETA HCG BLOOD, ED ([facility], [facility], [facility] ONLY) - Abnormal; Notable for the following components:   I-stat hCG,  quantitative 8.4 (*)    All other components within normal limits  LIPASE, BLOOD  URINALYSIS, ROUTINE W REFLEX MICROSCOPIC    EKG None  Radiology DG Abdomen 1 View  Result Date: 07/10/2020 CLINICAL DATA:  Abdominal pain, pancreatitis EXAM: ABDOMEN - 1 VIEW COMPARISON:  CT 07/07/2020 FINDINGS: The pelvis is excluded from view. The visualized abdominal gas pattern is nonobstructive with contrast from prior CT examination now identified within the transverse colon. Moderate splenomegaly again noted. No free intraperitoneal gas. Visualized lung bases are clear. IMPRESSION: Nonvisualization of the pelvis. Normal abdominal gas pattern. Moderate splenomegaly. Electronically Signed   By: Fidela Salisbury MD   On: 07/10/2020 12:32    Procedures Procedures   Medications Ordered in ED Medications  sodium chloride 0.9 % bolus 1,000 mL (0 mLs Intravenous Stopped 07/10/20 1314)    Followed by  0.9 %  sodium chloride infusion (1,000 mLs Intravenous New Bag/Given 07/10/20 1314)  ondansetron (ZOFRAN) injection 4 mg (4 mg Intravenous Given 07/10/20 1146)  HYDROmorphone (DILAUDID) injection 1 mg (1 mg Intravenous Given 07/10/20 1146)  pantoprazole (PROTONIX) injection 40 mg (40 mg Intravenous Given 07/10/20 1314)    ED Course  I have reviewed the triage vital signs and the nursing notes.  Pertinent labs & imaging results that were available during my care of the patient were reviewed by me and considered in my medical decision making (see chart for details).  Clinical Course as of 07/10/20 1535  Mon Jul 10, 2020  1213 Patient's white blood cell count is elevated at 50 team.  Hemoglobin upper limit of normal at 14.  Platelets also elevated 845.  Patient may  have a component of hemoconcentration [JK]  1213 hCG slightly increased at 8.4.  Previous labs have shown slightly elevated i-STAT hCG.  Consistent with false positive doubt pregnancy [JK]  1300 Lipase is normal at 30 [JK]  1416 Discussed with [facility].  Will consult on pt. [GE]  3662 Patient feeling better after IV fluids and medications. [JK]    Clinical Course User Index [JK] Dorie Rank, MD   MDM Rules/Calculators/A&P                          Patient presented to the ED for evaluation of nausea vomiting and upper abdominal pain.  Patient had an outpatient CT scan that suggested the possibility of duodenitis versus pancreatitis.  In the ED patient's laboratory tests were notable for an elevated white count as well as elevation of the platelets and hematocrit suggestive of hemoconcentration.  Electrolytes showed a slight decrease in bicarb.  Patient's lipase was normal arguing against pancreatitis.  Patient's outpatient CT scan was reviewed.  Have ordered IV fluids and pain medication for the patient.  Patient is feeling much better.  Have also added dose of Protonix.  Considering the CT scan finding I did consult with GI.  They will evaluate the patient and make recommendations.  Patient may be stable at this point for outpatient follow-up.  Final Clinical Impression(s) / ED Diagnoses Final diagnoses:  Duodenitis      Dorie Rank, MD 07/10/20 1535

## 2020-07-10 NOTE — H&P (View-Only) (Signed)
Referring Provider:  [facility]         Primary Care Physician:  Eileen Rasmussen, MD Primary Gastroenterologist:   Scarlette Shorts, MD          We were asked to see this patient for:     Abdominal pain and abnormal CT scan       Attending physician's note   I have taken a history, examined the patient and reviewed the chart. I agree with the Advanced Practitioner's note, impression and recommendations.  50 year old very pleasant female presented to ER with upper abdominal pain radiating to the back.  She was also experiencing nausea, vomiting and diarrhea for last couple days.  Her father had diarrhea but no abdominal pain.  CT abdomen and pelvis suspicious for possible mild pancreatitis and duodenal wall thickening  Though she has history of polycythemia, Hct appears to be hemoconcentrated compared to her baseline IV fluids bolus and maintenance  Sips of clears as tolerated PPI twice daily  N.p.o. after midnight, will reevaluate tomorrow and if continues to have persistent abdominal pain will consider EGD to exclude duodenal ulcer or duodenitis   The patient was provided an opportunity to ask questions and all were answered. The patient agreed with the plan and demonstrated an understanding of the instructions.  [name] , MD [phone]         ASSESSMENT / PLAN:   # 50 yo female with 8 day history of stabbing upper abdominal pain radiating around both upper sides into her back. Nausea, vomiting, diarrhea for last couple of days. CT scan shows inflammatory process at the pancreaticoduodenal groove with wall thickening of second portion of duodenum. She could have pancreatitis with reactive changes in duodenum vrs duodenal ulcer / duodenitis causing reactive changes in adjacent pancreas.  --She is hemoconcentrated with HCT of 50%. She got a liter bolus earlier and IVF now going at 125 ml / hr. --supportive care tonight. Sips of clears is okay. Will make NPO after MN  for possible EGD in am if not doing better.  --IV BID PPI --Hx of duodenal ulcers ( ? Maybe NSAID related). Check H.pylori IgG.   # Iron deficiency with ferritin of 4. Hgb 14 but hemoconcentrated. Her baseline hgb is ~ 12. She has polycythemia vera, gets phlebotomies ( last one in October 2021)  # Splenomegaly. Secondary to polycythemia vera?     HPI:                                                                                                                             Chief Complaint: abdominal pain  Eileen [name] is a 50 y.o. female with PMH significant for duodenal ulcer, GERD, polycythemia vera, IDA, obesity, headaches,  C-sections and hysterectomy  [name] began having diffuse upper abdominal pain 8 days ago. She has a history of duodenal ulcers but ulcer related pain is usually burning in nature,  gets better with food and /  or prilosec. This pain is a constant stabbing and achy pain starting in mid upper abdomen radiating around both upper sides to her back in a felt like fashion. Prilosec nor Pepto Bismuth helped the pain. Two days ago she developed nausea, vomiting and diarrhea. No dark emesis or blood in stool. She does not take NSAIDS. Patient had a cholecystectomy in November, possibly for biliary dyskinesia. That particular pain resolved post cholecystectomy.  She called Surgeon's office a few days ago about her current abdominal pain. CT scan ordered and remarkable for inflammatory process at the pancreaticoduodenal groove with mild wall thickening of the second portion of duodenum and splenomegaly. No history of heavy Etoh use.   Some of patent's family members has nausea,  vomiting and diarrhea several days ago but they did not have the associated upper abdominal pain like patient has.   In ED her WBC is elevated at 15.6, hgb 14, hct 50, platelets elevated at 845.Marland Kitchen Renal function is normal. Lipase 30, LFTs normal.    PREVIOUS ENDOSCOPIC EVALUATIONS / PERTINENT STUDIES      07/07/20 CT scan with IV contrast IMPRESSION: 1. Inflammatory process at the pancreaticoduodenal groove with mild wall thickening of the second portion of duodenum. Primary differential includes acute pancreatitis versus duodenitis. No free air to suggest perforation. 2. Splenomegaly    Past Medical History:  Diagnosis Date  . Acute duodenal ulcer without mention of hemorrhage, perforation, or obstruction   . Dysrhythmia    History of palpations, for many years  . GERD (gastroesophageal reflux disease)   . Headache    takes Advil  . Polycythemia vera ([place])   . Reflux esophagitis     Past Surgical History:  Procedure Laterality Date  . ABDOMINAL HYSTERECTOMY  2011ish  . CESAREAN SECTION     x 3   . ORIF ANKLE FRACTURE Left 12/21/2014   Procedure: OPEN REDUCTION INTERNAL FIXATION (ORIF) LEFT LATERAL MALLEOLUS ANKLE FRACTURE;  Surgeon: Leandrew Koyanagi, MD;  Location: [facility];  Service: Orthopedics;  Laterality: Left;  Needs RNFA  . UPPER GI ENDOSCOPY      Prior to Admission medications   Medication Sig Start Date End Date Taking? Authorizing Provider  albuterol (VENTOLIN HFA) 108 (90 Base) MCG/ACT inhaler Inhale 2 puffs into the lungs every 4 (four) hours as needed for wheezing or shortness of breath. Patient not taking: Reported on 08/03/2019 06/07/19   Hall-Potvin, [place], PA-C  alum & mag hydroxide-simeth (MAALOX ADVANCED MAX ST) 400-400-40 MG/5ML suspension Take 10 mLs by mouth every 6 (six) hours as needed for indigestion. Patient not taking: Reported on 02/01/2020 12/06/19   Margette Fast, MD  aspirin EC 325 MG tablet Take 1 tablet (325 mg total) by mouth daily. Patient not taking: Reported on 08/03/2019 01/29/17   Maryanna Shape, NP  butalbital-aspirin-caffeine [facility]) [phone] MG capsule Take 1 capsule by mouth 2 (two) times daily as needed for headache. Patient not taking: Reported on 02/01/2020    [provider]  Diclofenac Potassium 50 MG PACK Take 50  mg by mouth once as needed. For migraine. Patient not taking: Reported on 08/03/2019 04/30/17   Melvenia Beam, MD  famotidine (PEPCID) 40 MG tablet Take 1 tablet (40 mg total) by mouth daily. 12/06/19 01/05/20  Long, Wonda Olds, MD  fluticasone (FLONASE) 50 MCG/ACT nasal spray Place 1 spray into both nostrils daily. Patient not taking: Reported on 08/03/2019 06/07/19   Hall-Potvin, [place], PA-C  loratadine (CLARITIN) 10 MG tablet Take 1 tablet (10  mg total) by mouth daily. Patient not taking: Reported on 08/03/2019 06/07/19   Hall-Potvin, [place], PA-C  prochlorperazine (COMPAZINE) 5 MG tablet Take 1 tablet (5 mg total) by mouth every 8 (eight) hours as needed for nausea. Patient not taking: Reported on 08/03/2019 11/28/16   Melvenia Beam, MD  PROPRANOLOL HCL PO Take by mouth. Patient not taking: Reported on 02/01/2020    [provider]  rizatriptan (MAXALT) 10 MG tablet Take 1 tablet (10 mg total) by mouth as needed for migraine. May repeat in 2 hours if needed Patient not taking: Reported on 08/03/2019 04/30/17   Melvenia Beam, MD  Topiramate ER (QUDEXY XR) 150 MG CS24 sprinkle capsule Take 150 mg by mouth at bedtime. Patient not taking: Reported on 08/03/2019 04/30/17   Melvenia Beam, MD  traMADol (ULTRAM) 50 MG tablet Take 1 tablet (50 mg total) by mouth every 6 (six) hours as needed for moderate pain. Patient not taking: Reported on 08/03/2019 01/29/17   Maryanna Shape, NP    Current Facility-Administered Medications  Medication Dose Route Frequency Provider Last Rate Last Admin  . 0.9 %  sodium chloride infusion  1,000 mL Intravenous Continuous Dorie Rank, MD 125 mL/hr at 07/10/20 1314 1,000 mL at 07/10/20 1314   Current Outpatient Medications  Medication Sig Dispense Refill  . albuterol (VENTOLIN HFA) 108 (90 Base) MCG/ACT inhaler Inhale 2 puffs into the lungs every 4 (four) hours as needed for wheezing or shortness of breath. (Patient not taking: Reported on 08/03/2019)  18 g 0  . alum & mag hydroxide-simeth (MAALOX ADVANCED MAX ST) 400-400-40 MG/5ML suspension Take 10 mLs by mouth every 6 (six) hours as needed for indigestion. (Patient not taking: Reported on 02/01/2020) 355 mL 0  . aspirin EC 325 MG tablet Take 1 tablet (325 mg total) by mouth daily. (Patient not taking: Reported on 08/03/2019) 30 tablet 3  . butalbital-aspirin-caffeine (FIORINAL) 50-325-40 MG capsule Take 1 capsule by mouth 2 (two) times daily as needed for headache. (Patient not taking: Reported on 02/01/2020)    . Diclofenac Potassium 50 MG PACK Take 50 mg by mouth once as needed. For migraine. (Patient not taking: Reported on 08/03/2019) 9 each 11  . famotidine (PEPCID) 40 MG tablet Take 1 tablet (40 mg total) by mouth daily. 30 tablet 0  . fluticasone (FLONASE) 50 MCG/ACT nasal spray Place 1 spray into both nostrils daily. (Patient not taking: Reported on 08/03/2019) 16 g 0  . loratadine (CLARITIN) 10 MG tablet Take 1 tablet (10 mg total) by mouth daily. (Patient not taking: Reported on 08/03/2019) 30 tablet 0  . prochlorperazine (COMPAZINE) 5 MG tablet Take 1 tablet (5 mg total) by mouth every 8 (eight) hours as needed for nausea. (Patient not taking: Reported on 08/03/2019) 30 tablet 3  . PROPRANOLOL HCL PO Take by mouth. (Patient not taking: Reported on 02/01/2020)    . rizatriptan (MAXALT) 10 MG tablet Take 1 tablet (10 mg total) by mouth as needed for migraine. May repeat in 2 hours if needed (Patient not taking: Reported on 08/03/2019) 10 tablet 11  . Topiramate ER (QUDEXY XR) 150 MG CS24 sprinkle capsule Take 150 mg by mouth at bedtime. (Patient not taking: Reported on 08/03/2019) 30 each 11  . traMADol (ULTRAM) 50 MG tablet Take 1 tablet (50 mg total) by mouth every 6 (six) hours as needed for moderate pain. (Patient not taking: Reported on 08/03/2019) 30 tablet 0   Facility-Administered Medications Ordered in Other Encounters  Medication  Dose Route Frequency Provider Last Rate Last Admin  .  gadopentetate dimeglumine (MAGNEVIST) injection 18 mL  18 mL Intravenous Once PRN Melvenia Beam, MD        Allergies as of 07/10/2020  . (No Known Allergies)    Family History  Problem Relation Age of Onset  . Migraines Mother   . Breast cancer Other        Great [place]   . Colon cancer Neg Hx     Social History   Socioeconomic History  . Marital status: Married    Spouse name: Not on file  . Number of children: 3  . Years of education: Not on file  . Highest education level: Not on file  Occupational History  . Occupation: Agricultural engineer: [employer]  Tobacco Use  . Smoking status: Never Smoker  . Smokeless tobacco: Never Used  Vaping Use  . Vaping Use: Never used  Substance and Sexual Activity  . Alcohol use: No  . Drug use: No  . Sexual activity: Yes    Birth control/protection: Surgical  Other Topics Concern  . Not on file  Social History Narrative   No caffeine    Right handed   Lives at home with her husband and daughter   Social Determinants of Health   Financial Resource Strain: Not on file  Food Insecurity: Not on file  Transportation Needs: Not on file  Physical Activity: Not on file  Stress: Not on file  Social Connections: Not on file  Intimate Partner Violence: Not on file    Review of Systems: All systems reviewed and negative except where noted in HPI.    OBJECTIVE:    Physical Exam: Vital signs in last 24 hours: Temp:  [98.4 F (36.9 C)] 98.4 F (36.9 C) (01/31 1103) Pulse Rate:  [90-121] 98 (01/31 1500) Resp:  [16-18] 18 (01/31 1500) BP: (109-150)/(70-98) 119/81 (01/31 1500) SpO2:  [99 %-100 %] 100 % (01/31 1500)   General:   Alert  female in NAD Psych:  Pleasant, cooperative. Normal mood and affect. Eyes:  Pupils equal, sclera clear, no icterus.   Conjunctiva pink. Ears:  Normal auditory acuity. Nose:  No deformity, discharge,  or lesions. Neck:  Supple; no masses Lungs:  Clear throughout to  auscultation.   No wheezes, crackles, or rhonchi.  Heart:  Regular rate and rhythm; no murmurs, no lower extremity edema Abdomen:  Soft, non-distended, mild mid upper abdominal tendernss, BS active, no palp mass   Rectal:  Deferred  Msk:  Symmetrical without gross deformities. . Neurologic:  Alert and  oriented x4;  grossly normal neurologically. Skin:  Intact without significant lesions or rashes.    Intake/Output from previous day: No intake/output data recorded. Intake/Output this shift: Total I/O In: 1000 [IV Piggyback:1000] Out: -    Lab Results: Recent Labs    07/10/20 1145  WBC 15.6*  HGB 14.0  HCT 50.4*  PLT 845*   BMET Recent Labs    07/10/20 1227  NA 138  K 3.6  CL 105  CO2 19*  GLUCOSE 102*  BUN 17  CREATININE 0.90  CALCIUM 9.1   LFT Recent Labs    07/10/20 1227  PROT 7.7  ALBUMIN 4.6  AST 12*  ALT 10  ALKPHOS 88  BILITOT 0.8   PT/INR No results for input(s): LABPROT, INR in the last 72 hours. Hepatitis Panel No results for input(s): HEPBSAG, HCVAB, HEPAIGM, HEPBIGM in the last 72 hours.   Marland Kitchen  CBC Latest Ref Rng & Units 07/10/2020 06/16/2020 05/12/2020  WBC 4.0 - 10.5 K/uL 15.6(H) 9.4 8.7  Hemoglobin 12.0 - 15.0 g/dL 14.0 12.1 12.4  Hematocrit 36.0 - 46.0 % 50.4(H) 43.3 44.5  Platelets 150 - 400 K/uL 845(H) 597(H) 564(H)    . CMP Latest Ref Rng & Units 07/10/2020 12/06/2019 06/10/2019  Glucose 70 - 99 mg/dL 102(H) 93 136(H)  BUN 6 - 20 mg/dL 17 10 12   Creatinine 0.44 - 1.00 mg/dL 0.90 0.91 0.84  Sodium 135 - 145 mmol/L 138 139 139  Potassium 3.5 - 5.1 mmol/L 3.6 3.9 3.9  Chloride 98 - 111 mmol/L 105 104 106  CO2 22 - 32 mmol/L 19(L) 25 26  Calcium 8.9 - 10.3 mg/dL 9.1 9.6 9.1  Total Protein 6.5 - 8.1 g/dL 7.7 7.9 7.7  Total Bilirubin 0.3 - 1.2 mg/dL 0.8 0.7 0.3  Alkaline Phos 38 - 126 U/L 88 87 80  AST 15 - 41 U/L 12(L) 20 15  ALT 0 - 44 U/L 10 17 12    Studies/Results: DG Abdomen 1 View  Result Date: 07/10/2020 CLINICAL DATA:   Abdominal pain, pancreatitis EXAM: ABDOMEN - 1 VIEW COMPARISON:  CT 07/07/2020 FINDINGS: The pelvis is excluded from view. The visualized abdominal gas pattern is nonobstructive with contrast from prior CT examination now identified within the transverse colon. Moderate splenomegaly again noted. No free intraperitoneal gas. Visualized lung bases are clear. IMPRESSION: Nonvisualization of the pelvis. Normal abdominal gas pattern. Moderate splenomegaly. Electronically Signed   By: Fidela Salisbury MD   On: 07/10/2020 12:32    Active Problems:   * No active hospital problems. Tye Savoy, NP-C @  07/10/2020, 3:32 PM

## 2020-07-10 NOTE — Consult Note (Addendum)
Referring Provider:  [facility]         Primary Care Physician:  [name], MD Primary Gastroenterologist:   Scarlette Shorts, MD          We were asked to see this patient for:     Abdominal pain and abnormal CT scan       Attending physician's note   I have taken a history, examined the patient and reviewed the chart. I agree with the Advanced Practitioner's note, impression and recommendations.  50 year old very pleasant female presented to ER with upper abdominal pain radiating to the back.  She was also experiencing nausea, vomiting and diarrhea for last couple days.  Her father had diarrhea but no abdominal pain.  CT abdomen and pelvis suspicious for possible mild pancreatitis and duodenal wall thickening  Though she has history of polycythemia, Hct appears to be hemoconcentrated compared to her baseline IV fluids bolus and maintenance  Sips of clears as tolerated PPI twice daily  N.p.o. after midnight, will reevaluate tomorrow and if continues to have persistent abdominal pain will consider EGD to exclude duodenal ulcer or duodenitis   The patient was provided an opportunity to ask questions and all were answered. The patient agreed with the plan and demonstrated an understanding of the instructions.  [name] , MD [phone]         ASSESSMENT / PLAN:   # 50 yo female with 8 day history of stabbing upper abdominal pain radiating around both upper sides into her back. Nausea, vomiting, diarrhea for last couple of days. CT scan shows inflammatory process at the pancreaticoduodenal groove with wall thickening of second portion of duodenum. She could have pancreatitis with reactive changes in duodenum vrs duodenal ulcer / duodenitis causing reactive changes in adjacent pancreas.  --She is hemoconcentrated with HCT of 50%. She got a liter bolus earlier and IVF now going at 125 ml / hr. --supportive care tonight. Sips of clears is okay. Will make NPO after MN  for possible EGD in am if not doing better.  --IV BID PPI --Hx of duodenal ulcers ( ? Maybe NSAID related). Check H.pylori IgG.   # Iron deficiency with ferritin of 4. Hgb 14 but hemoconcentrated. Her baseline hgb is ~ 12. She has polycythemia vera, gets phlebotomies ( last one in October 2021)  # Splenomegaly. Secondary to polycythemia vera?     HPI:                                                                                                                             Chief Complaint: abdominal pain  Eileen [name] is a 50 y.o. female with PMH significant for duodenal ulcer, GERD, polycythemia vera, IDA, obesity, headaches,  C-sections and hysterectomy  [name] began having diffuse upper abdominal pain 8 days ago. She has a history of duodenal ulcers but ulcer related pain is usually burning in nature,  gets better with food and /  or prilosec. This pain is a constant stabbing and achy pain starting in mid upper abdomen radiating around both upper sides to her back in a felt like fashion. Prilosec nor Pepto Bismuth helped the pain. Two days ago she developed nausea, vomiting and diarrhea. No dark emesis or blood in stool. She does not take NSAIDS. Patient had a cholecystectomy in November, possibly for biliary dyskinesia. That particular pain resolved post cholecystectomy.  She called Surgeon's office a few days ago about her current abdominal pain. CT scan ordered and remarkable for inflammatory process at the pancreaticoduodenal groove with mild wall thickening of the second portion of duodenum and splenomegaly. No history of heavy Etoh use.   Some of patent's family members has nausea,  vomiting and diarrhea several days ago but they did not have the associated upper abdominal pain like patient has.   In ED her WBC is elevated at 15.6, hgb 14, hct 50, platelets elevated at 845.Marland Kitchen Renal function is normal. Lipase 30, LFTs normal.    PREVIOUS ENDOSCOPIC EVALUATIONS / PERTINENT STUDIES      07/07/20 CT scan with IV contrast IMPRESSION: 1. Inflammatory process at the pancreaticoduodenal groove with mild wall thickening of the second portion of duodenum. Primary differential includes acute pancreatitis versus duodenitis. No free air to suggest perforation. 2. Splenomegaly    Past Medical History:  Diagnosis Date  . Acute duodenal ulcer without mention of hemorrhage, perforation, or obstruction   . Dysrhythmia    History of palpations, for many years  . GERD (gastroesophageal reflux disease)   . Headache    takes Advil  . Polycythemia vera ([place])   . Reflux esophagitis     Past Surgical History:  Procedure Laterality Date  . ABDOMINAL HYSTERECTOMY  2011ish  . CESAREAN SECTION     x 3   . ORIF ANKLE FRACTURE Left 12/21/2014   Procedure: OPEN REDUCTION INTERNAL FIXATION (ORIF) LEFT LATERAL MALLEOLUS ANKLE FRACTURE;  Surgeon: Leandrew Koyanagi, MD;  Location: [facility];  Service: Orthopedics;  Laterality: Left;  Needs RNFA  . UPPER GI ENDOSCOPY      Prior to Admission medications   Medication Sig Start Date End Date Taking? Authorizing Provider  albuterol (VENTOLIN HFA) 108 (90 Base) MCG/ACT inhaler Inhale 2 puffs into the lungs every 4 (four) hours as needed for wheezing or shortness of breath. Patient not taking: Reported on 08/03/2019 06/07/19   Hall-Potvin, [place], PA-C  alum & mag hydroxide-simeth (MAALOX ADVANCED MAX ST) 400-400-40 MG/5ML suspension Take 10 mLs by mouth every 6 (six) hours as needed for indigestion. Patient not taking: Reported on 02/01/2020 12/06/19   Margette Fast, MD  aspirin EC 325 MG tablet Take 1 tablet (325 mg total) by mouth daily. Patient not taking: Reported on 08/03/2019 01/29/17   Maryanna Shape, NP  butalbital-aspirin-caffeine [facility]) [phone] MG capsule Take 1 capsule by mouth 2 (two) times daily as needed for headache. Patient not taking: Reported on 02/01/2020    [provider]  Diclofenac Potassium 50 MG PACK Take 50  mg by mouth once as needed. For migraine. Patient not taking: Reported on 08/03/2019 04/30/17   Melvenia Beam, MD  famotidine (PEPCID) 40 MG tablet Take 1 tablet (40 mg total) by mouth daily. 12/06/19 01/05/20  Long, Wonda Olds, MD  fluticasone (FLONASE) 50 MCG/ACT nasal spray Place 1 spray into both nostrils daily. Patient not taking: Reported on 08/03/2019 06/07/19   Hall-Potvin, [place], PA-C  loratadine (CLARITIN) 10 MG tablet Take 1 tablet (10  mg total) by mouth daily. Patient not taking: Reported on 08/03/2019 06/07/19   Hall-Potvin, [place], PA-C  prochlorperazine (COMPAZINE) 5 MG tablet Take 1 tablet (5 mg total) by mouth every 8 (eight) hours as needed for nausea. Patient not taking: Reported on 08/03/2019 11/28/16   Melvenia Beam, MD  PROPRANOLOL HCL PO Take by mouth. Patient not taking: Reported on 02/01/2020    [provider]  rizatriptan (MAXALT) 10 MG tablet Take 1 tablet (10 mg total) by mouth as needed for migraine. May repeat in 2 hours if needed Patient not taking: Reported on 08/03/2019 04/30/17   Melvenia Beam, MD  Topiramate ER (QUDEXY XR) 150 MG CS24 sprinkle capsule Take 150 mg by mouth at bedtime. Patient not taking: Reported on 08/03/2019 04/30/17   Melvenia Beam, MD  traMADol (ULTRAM) 50 MG tablet Take 1 tablet (50 mg total) by mouth every 6 (six) hours as needed for moderate pain. Patient not taking: Reported on 08/03/2019 01/29/17   Maryanna Shape, NP    Current Facility-Administered Medications  Medication Dose Route Frequency Provider Last Rate Last Admin  . 0.9 %  sodium chloride infusion  1,000 mL Intravenous Continuous Dorie Rank, MD 125 mL/hr at 07/10/20 1314 1,000 mL at 07/10/20 1314   Current Outpatient Medications  Medication Sig Dispense Refill  . albuterol (VENTOLIN HFA) 108 (90 Base) MCG/ACT inhaler Inhale 2 puffs into the lungs every 4 (four) hours as needed for wheezing or shortness of breath. (Patient not taking: Reported on 08/03/2019)  18 g 0  . alum & mag hydroxide-simeth (MAALOX ADVANCED MAX ST) 400-400-40 MG/5ML suspension Take 10 mLs by mouth every 6 (six) hours as needed for indigestion. (Patient not taking: Reported on 02/01/2020) 355 mL 0  . aspirin EC 325 MG tablet Take 1 tablet (325 mg total) by mouth daily. (Patient not taking: Reported on 08/03/2019) 30 tablet 3  . butalbital-aspirin-caffeine (FIORINAL) 50-325-40 MG capsule Take 1 capsule by mouth 2 (two) times daily as needed for headache. (Patient not taking: Reported on 02/01/2020)    . Diclofenac Potassium 50 MG PACK Take 50 mg by mouth once as needed. For migraine. (Patient not taking: Reported on 08/03/2019) 9 each 11  . famotidine (PEPCID) 40 MG tablet Take 1 tablet (40 mg total) by mouth daily. 30 tablet 0  . fluticasone (FLONASE) 50 MCG/ACT nasal spray Place 1 spray into both nostrils daily. (Patient not taking: Reported on 08/03/2019) 16 g 0  . loratadine (CLARITIN) 10 MG tablet Take 1 tablet (10 mg total) by mouth daily. (Patient not taking: Reported on 08/03/2019) 30 tablet 0  . prochlorperazine (COMPAZINE) 5 MG tablet Take 1 tablet (5 mg total) by mouth every 8 (eight) hours as needed for nausea. (Patient not taking: Reported on 08/03/2019) 30 tablet 3  . PROPRANOLOL HCL PO Take by mouth. (Patient not taking: Reported on 02/01/2020)    . rizatriptan (MAXALT) 10 MG tablet Take 1 tablet (10 mg total) by mouth as needed for migraine. May repeat in 2 hours if needed (Patient not taking: Reported on 08/03/2019) 10 tablet 11  . Topiramate ER (QUDEXY XR) 150 MG CS24 sprinkle capsule Take 150 mg by mouth at bedtime. (Patient not taking: Reported on 08/03/2019) 30 each 11  . traMADol (ULTRAM) 50 MG tablet Take 1 tablet (50 mg total) by mouth every 6 (six) hours as needed for moderate pain. (Patient not taking: Reported on 08/03/2019) 30 tablet 0   Facility-Administered Medications Ordered in Other Encounters  Medication  Dose Route Frequency Provider Last Rate Last Admin  .  gadopentetate dimeglumine (MAGNEVIST) injection 18 mL  18 mL Intravenous Once PRN Melvenia Beam, MD        Allergies as of 07/10/2020  . (No Known Allergies)    Family History  Problem Relation Age of Onset  . Migraines Mother   . Breast cancer Other        Great [place]   . Colon cancer Neg Hx     Social History   Socioeconomic History  . Marital status: Married    Spouse name: Not on file  . Number of children: 3  . Years of education: Not on file  . Highest education level: Not on file  Occupational History  . Occupation: Agricultural engineer: [employer]  Tobacco Use  . Smoking status: Never Smoker  . Smokeless tobacco: Never Used  Vaping Use  . Vaping Use: Never used  Substance and Sexual Activity  . Alcohol use: No  . Drug use: No  . Sexual activity: Yes    Birth control/protection: Surgical  Other Topics Concern  . Not on file  Social History Narrative   No caffeine    Right handed   Lives at home with her husband and daughter   Social Determinants of Health   Financial Resource Strain: Not on file  Food Insecurity: Not on file  Transportation Needs: Not on file  Physical Activity: Not on file  Stress: Not on file  Social Connections: Not on file  Intimate Partner Violence: Not on file    Review of Systems: All systems reviewed and negative except where noted in HPI.    OBJECTIVE:    Physical Exam: Vital signs in last 24 hours: Temp:  [98.4 F (36.9 C)] 98.4 F (36.9 C) (01/31 1103) Pulse Rate:  [90-121] 98 (01/31 1500) Resp:  [16-18] 18 (01/31 1500) BP: (109-150)/(70-98) 119/81 (01/31 1500) SpO2:  [99 %-100 %] 100 % (01/31 1500)   General:   Alert  female in NAD Psych:  Pleasant, cooperative. Normal mood and affect. Eyes:  Pupils equal, sclera clear, no icterus.   Conjunctiva pink. Ears:  Normal auditory acuity. Nose:  No deformity, discharge,  or lesions. Neck:  Supple; no masses Lungs:  Clear throughout to  auscultation.   No wheezes, crackles, or rhonchi.  Heart:  Regular rate and rhythm; no murmurs, no lower extremity edema Abdomen:  Soft, non-distended, mild mid upper abdominal tendernss, BS active, no palp mass   Rectal:  Deferred  Msk:  Symmetrical without gross deformities. . Neurologic:  Alert and  oriented x4;  grossly normal neurologically. Skin:  Intact without significant lesions or rashes.    Intake/Output from previous day: No intake/output data recorded. Intake/Output this shift: Total I/O In: 1000 [IV Piggyback:1000] Out: -    Lab Results: Recent Labs    07/10/20 1145  WBC 15.6*  HGB 14.0  HCT 50.4*  PLT 845*   BMET Recent Labs    07/10/20 1227  NA 138  K 3.6  CL 105  CO2 19*  GLUCOSE 102*  BUN 17  CREATININE 0.90  CALCIUM 9.1   LFT Recent Labs    07/10/20 1227  PROT 7.7  ALBUMIN 4.6  AST 12*  ALT 10  ALKPHOS 88  BILITOT 0.8   PT/INR No results for input(s): LABPROT, INR in the last 72 hours. Hepatitis Panel No results for input(s): HEPBSAG, HCVAB, HEPAIGM, HEPBIGM in the last 72 hours.   Marland Kitchen  CBC Latest Ref Rng & Units 07/10/2020 06/16/2020 05/12/2020  WBC 4.0 - 10.5 K/uL 15.6(H) 9.4 8.7  Hemoglobin 12.0 - 15.0 g/dL 14.0 12.1 12.4  Hematocrit 36.0 - 46.0 % 50.4(H) 43.3 44.5  Platelets 150 - 400 K/uL 845(H) 597(H) 564(H)    . CMP Latest Ref Rng & Units 07/10/2020 12/06/2019 06/10/2019  Glucose 70 - 99 mg/dL 102(H) 93 136(H)  BUN 6 - 20 mg/dL 17 10 12   Creatinine 0.44 - 1.00 mg/dL 0.90 0.91 0.84  Sodium 135 - 145 mmol/L 138 139 139  Potassium 3.5 - 5.1 mmol/L 3.6 3.9 3.9  Chloride 98 - 111 mmol/L 105 104 106  CO2 22 - 32 mmol/L 19(L) 25 26  Calcium 8.9 - 10.3 mg/dL 9.1 9.6 9.1  Total Protein 6.5 - 8.1 g/dL 7.7 7.9 7.7  Total Bilirubin 0.3 - 1.2 mg/dL 0.8 0.7 0.3  Alkaline Phos 38 - 126 U/L 88 87 80  AST 15 - 41 U/L 12(L) 20 15  ALT 0 - 44 U/L 10 17 12    Studies/Results: DG Abdomen 1 View  Result Date: 07/10/2020 CLINICAL DATA:   Abdominal pain, pancreatitis EXAM: ABDOMEN - 1 VIEW COMPARISON:  CT 07/07/2020 FINDINGS: The pelvis is excluded from view. The visualized abdominal gas pattern is nonobstructive with contrast from prior CT examination now identified within the transverse colon. Moderate splenomegaly again noted. No free intraperitoneal gas. Visualized lung bases are clear. IMPRESSION: Nonvisualization of the pelvis. Normal abdominal gas pattern. Moderate splenomegaly. Electronically Signed   By: Fidela Salisbury MD   On: 07/10/2020 12:32    Active Problems:   * No active hospital problems. Tye Savoy, NP-C @  07/10/2020, 3:32 PM

## 2020-07-10 NOTE — ED Triage Notes (Signed)
Pt presents with c/o abdominal pain for approx 8 days. Pt reports had her gallbladder removed in November, contacted her doctor on Friday after hurting for 5 days and was instructed to come to have a CT. Pt was diagnosed with pancreatitis from that CT and when she called her MD today, he instructed her to come here for further evaluation.

## 2020-07-10 NOTE — Consult Note (Signed)
CONSULT NOTE    Eileen [name]  BJY:[id number] DOB: Oct 28, 1970 DOA: 07/10/2020 PCP: [name], MD   Brief Narrative:   Patient is a 50 yr old female with a medical and surgical history significant for GERD, reflux esophagitis, polycythemia vera, duodenal ulcer, migraine headaches, morbid obesity, cholecystectomy in November 2021, C-sections, and a hysterectomy.    Patient presents to the ED for evaluation of constant, stabbing abdominal pain which started 8 days ago.  It started in the upper abdomen radiating around both sides to her back. Pain unrelieved with food, Prilosec, or Pepto. The patient has a history of duodenal ulcers but describes her current pain as being different. Duodenal pain was more burning in nature.  She does not take NSAID's. Relatives in her household have been sick with a "stomach" bug with mostly diarrhea.  At first patient thought she had contracted the same illness.  However, her symptoms mainly consisted of abdominal pain.  Two days ago she did develop nausea and vomiting, and limited amounts of diarrhea. No dark emesis or dark blood in stool.  Because her symptoms did not subside, she contacted her surgeon (the one who did her cholecystectomy in November) and an outpatient CT was performed on 1/28.  The surgeon notified patient that she may have pancreatitis and to take Tylenol and drink plenty of fluids.   Assessment & Plan: #50 year old female, with 8 day history of severe generalized upper abdominal pain, limited nausea, vomiting, diarrhea.  She has elevated white count, patient is hemoconcentrated. CT scan suggests pancreatitis and duodenitis. Patient does have a history of duodenal ulcers.  Recurrent duodenal ulcer is on the list of differential diagnoses.  She could also have pancreatitis causing reactive duodenitis.  Etiology pf pancreatitis unclear at this time.  No history of alcohol use, no evidence of biliary source.  Will need to review home  medications for potential culprits. -Supportive care with fluid resuscitation, antiemetics, and analgesics -Okay for clear liquids, will make NPO past midnight for possible EGD if she does not improve/gets worse. -Given history of PUD will check for H.pylori -Place on PPI BID IV  #Splenomegaly on CT scan, etiology unclear ? Secondary to Polycythemia vera -May need further evaluation after resolution of acute illness  Subjective:  Patient reports aching pain in the epigastric area, wrapping around to the back on both sides.    Review of Systems Otherwise negative except as per HPI, including: General: Denies fever, chills, night sweats or unintended weight loss. Resp: Denies cough, wheezing, shortness of breath. Cardiac: Denies chest pain, palpitations, orthopnea, paroxysmal nocturnal dyspnea. GI: Denies endorses abdominal pain, nausea, vomiting, but denies diarrhea or constipation GU: Denies dysuria, frequency, hesitancy or incontinence MS: Denies muscle aches, joint pain or swelling Neuro: Denies headache, neurologic deficits (focal weakness, numbness, tingling), abnormal gait Psych: Denies anxiety, depression, SI/HI/AVH Skin: Denies new rashes or lesions ID: Denies sick contacts, exotic exposures, travel  Examination:  General exam: Appears calm and comfortable  Respiratory system: Clear to auscultation. Respiratory effort normal. Cardiovascular system: S1 & S2 heard, RRR. No JVD, murmurs, rubs, gallops or clicks. No pedal edema. Gastrointestinal system: Abdomen is nondistended, with abdominal tenderness in the epigastric area. There is no guarding or rebound. No organomegaly or masses felt. Normal bowel sounds heard. Central nervous system: Alert and oriented. No focal neurological deficits. Extremities: Symmetric 5 x 5 power. Skin: No rashes, lesions or ulcers Psychiatry: Judgement and insight appear normal. Mood & affect appropriate.  Objective: Vitals:   07/10/20  1205 07/10/20 1315 07/10/20 1416 07/10/20 1500  BP: 129/73 122/86 109/70 119/81  Pulse: 100 99 90 98  Resp: 18 18 18 18   Temp:      TempSrc:      SpO2: 99% 99% 99% 100%    Intake/Output Summary (Last 24 hours) at 07/10/2020 1618 Last data filed at 07/10/2020 1314 Gross per 24 hour  Intake 1000 ml  Output --  Net 1000 ml   There were no vitals filed for this visit.   Data Reviewed:   CBC: Recent Labs  Lab 07/10/20 1145  WBC 15.6*  HGB 14.0  HCT 50.4*  MCV 69.1*  PLT 798*   Basic Metabolic Panel: Recent Labs  Lab 07/10/20 1227  NA 138  K 3.6  CL 105  CO2 19*  GLUCOSE 102*  BUN 17  CREATININE 0.90  CALCIUM 9.1   GFR: CrCl cannot be calculated (Unknown ideal weight.). Liver Function Tests: Recent Labs  Lab 07/10/20 1227  AST 12*  ALT 10  ALKPHOS 88  BILITOT 0.8  PROT 7.7  ALBUMIN 4.6   Recent Labs  Lab 07/10/20 1227  LIPASE 30   No results for input(s): AMMONIA in the last 168 hours. Coagulation Profile: No results for input(s): INR, PROTIME in the last 168 hours. Cardiac Enzymes: No results for input(s): CKTOTAL, CKMB, CKMBINDEX, TROPONINI in the last 168 hours. BNP (last 3 results) No results for input(s): PROBNP in the last 8760 hours. HbA1C: No results for input(s): HGBA1C in the last 72 hours. CBG: No results for input(s): GLUCAP in the last 168 hours. Lipid Profile: No results for input(s): CHOL, HDL, LDLCALC, TRIG, CHOLHDL, LDLDIRECT in the last 72 hours. Thyroid Function Tests: No results for input(s): TSH, T4TOTAL, FREET4, T3FREE, THYROIDAB in the last 72 hours. Anemia Panel: No results for input(s): VITAMINB12, FOLATE, FERRITIN, TIBC, IRON, RETICCTPCT in the last 72 hours. Sepsis Labs: No results for input(s): PROCALCITON, LATICACIDVEN in the last 168 hours.  No results found for this or any previous visit (from the past 240 hour(s)).       Radiology Studies: DG Abdomen 1 View  Result Date: 07/10/2020 CLINICAL DATA:   Abdominal pain, pancreatitis EXAM: ABDOMEN - 1 VIEW COMPARISON:  CT 07/07/2020 FINDINGS: The pelvis is excluded from view. The visualized abdominal gas pattern is nonobstructive with contrast from prior CT examination now identified within the transverse colon. Moderate splenomegaly again noted. No free intraperitoneal gas. Visualized lung bases are clear. IMPRESSION: Nonvisualization of the pelvis. Normal abdominal gas pattern. Moderate splenomegaly. Electronically Signed   By: Fidela Salisbury MD   On: 07/10/2020 12:32        Scheduled Meds: Continuous Infusions: . sodium chloride 1,000 mL (07/10/20 1314)     LOS: 0 days   Time spent= 20 mins  Dede Query, RN NP-Student   If 7PM-7AM, please contact night-coverage  07/10/2020, 4:18 PM

## 2020-07-10 NOTE — H&P (Signed)
[facility] History and Physical  Eileen [name] [id number] DOB: 1971-03-26 DOA: 07/10/2020  Referring physician: ED  PCP: [name], MD   Patient is coming from: Home  Chief Complaint: Abdominal pain  HPI: Eileen [name] is a 50 y.o. female with past medical history of duodenal ulcer, GERD, polycythemia vera, obesity, migraine headache presented to the hospital with diffuse upper abdominal pain for 7 to 8 days prior to presentation mostly burning in nature better with food and Prilosec.  Pain has been constant stabbing in nature radiating to both sides of the back.  This was associated with nausea vomiting and diarrhea 2 days prior to this presentation.  Patient denies any ingestion of alcohol or NSAIDs.  She did have history of cholecystectomy in November 2021 for biliary dyskinesia after which her pain had subsided.  Because she persisted to have pain she went to see the surgeon on 07/08/2018 who had a CT scan done which showed inflammatory stranding around the pancreaticoduodenal groove with mild thickening suggestive of duodenitis/pancreatitis.  Patient spoke with her surgeon today and since she was having persistent symptoms she was advised for coming to the hospital.  She also had vomiting every day and was unable to keep much down.  Patient does have history of duodenal ulcers for a long time but has not been on any proton pump inhibitors as outpatient  ED Course: In the ED, patient had elevated WBC and hematocrit at 50 and elevated platelets likely secondary to hemoconcentration.  Lipase was at 30.  LFTs were within normal limits.  Patient received IV fluid bolus in the ED, Zofran, Dilaudid and Protonix 40 mg IV.  GI was consulted and GI recommended admission to the hospital for conservative treatment followed by possible EGD if needed.  Review of Systems:  All systems were reviewed and were negative unless otherwise mentioned in the HPI  Past Medical History:   Diagnosis Date  . Acute duodenal ulcer without mention of hemorrhage, perforation, or obstruction   . Dysrhythmia    History of palpations, for many years  . GERD (gastroesophageal reflux disease)   . Headache    takes Advil  . Polycythemia vera ([place])   . Reflux esophagitis    Past Surgical History:  Procedure Laterality Date  . ABDOMINAL HYSTERECTOMY  2011ish  . CESAREAN SECTION     x 3   . ORIF ANKLE FRACTURE Left 12/21/2014   Procedure: OPEN REDUCTION INTERNAL FIXATION (ORIF) LEFT LATERAL MALLEOLUS ANKLE FRACTURE;  Surgeon: Leandrew Koyanagi, MD;  Location: [place];  Service: Orthopedics;  Laterality: Left;  Needs RNFA  . UPPER GI ENDOSCOPY      Social History:  reports that she has never smoked. She has never used smokeless tobacco. She reports that she does not drink alcohol and does not use drugs.  No Known Allergies  Family History  Problem Relation Age of Onset  . Migraines Mother   . Breast cancer Other        [place]   . Colon cancer Neg Hx      Prior to Admission medications   Medication Sig Start Date End Date Taking? Authorizing Provider  albuterol (VENTOLIN HFA) 108 (90 Base) MCG/ACT inhaler Inhale 2 puffs into the lungs every 4 (four) hours as needed for wheezing or shortness of breath. Patient not taking: Reported on 08/03/2019 06/07/19   Hall-Potvin, [place], PA-C  alum & mag hydroxide-simeth (MAALOX ADVANCED MAX ST) 400-400-40 MG/5ML suspension Take 10 mLs by mouth  every 6 (six) hours as needed for indigestion. Patient not taking: Reported on 02/01/2020 12/06/19   Margette Fast, MD  aspirin EC 325 MG tablet Take 1 tablet (325 mg total) by mouth daily. Patient not taking: Reported on 08/03/2019 01/29/17   Maryanna Shape, NP  butalbital-aspirin-caffeine [facility]) [phone] MG capsule Take 1 capsule by mouth 2 (two) times daily as needed for headache. Patient not taking: Reported on 02/01/2020    [provider]  Diclofenac Potassium 50 MG PACK Take 50 mg  by mouth once as needed. For migraine. Patient not taking: Reported on 08/03/2019 04/30/17   Melvenia Beam, MD  famotidine (PEPCID) 40 MG tablet Take 1 tablet (40 mg total) by mouth daily. 12/06/19 01/05/20  Long, Wonda Olds, MD  fluticasone (FLONASE) 50 MCG/ACT nasal spray Place 1 spray into both nostrils daily. Patient not taking: Reported on 08/03/2019 06/07/19   Hall-Potvin, [place], PA-C  loratadine (CLARITIN) 10 MG tablet Take 1 tablet (10 mg total) by mouth daily. Patient not taking: Reported on 08/03/2019 06/07/19   Hall-Potvin, [place], PA-C  prochlorperazine (COMPAZINE) 5 MG tablet Take 1 tablet (5 mg total) by mouth every 8 (eight) hours as needed for nausea. Patient not taking: Reported on 08/03/2019 11/28/16   Melvenia Beam, MD  PROPRANOLOL HCL PO Take by mouth. Patient not taking: Reported on 02/01/2020    [provider]  rizatriptan (MAXALT) 10 MG tablet Take 1 tablet (10 mg total) by mouth as needed for migraine. May repeat in 2 hours if needed Patient not taking: Reported on 08/03/2019 04/30/17   Melvenia Beam, MD  Topiramate ER (QUDEXY XR) 150 MG CS24 sprinkle capsule Take 150 mg by mouth at bedtime. Patient not taking: Reported on 08/03/2019 04/30/17   Melvenia Beam, MD  traMADol (ULTRAM) 50 MG tablet Take 1 tablet (50 mg total) by mouth every 6 (six) hours as needed for moderate pain. Patient not taking: Reported on 08/03/2019 01/29/17   Maryanna Shape, NP    Physical Exam: Vitals:   07/10/20 1315 07/10/20 1416 07/10/20 1500 07/10/20 1645  BP: 122/86 109/70 119/81 125/77  Pulse: 99 90 98 100  Resp: 18 18 18 18   Temp:      TempSrc:      SpO2: 99% 99% 100% 100%   Wt Readings from Last 3 Encounters:  05/12/20 99.7 kg  02/01/20 103.4 kg  08/03/19 98.2 kg   There is no height or weight on file to calculate BMI.  General: Alert awake oriented, is built, not in obvious distress HENT: Normocephalic, pupils equally reacting to light and accommodation.  No  scleral pallor or icterus noted. Oral mucosa is moist.  Chest:  Clear breath sounds.  Diminished breath sounds bilaterally. No crackles or wheezes.  CVS: S1 &S2 heard. No murmur.  Regular rate and rhythm. Abdomen: Soft, epigastric guarding with tenderness Extremities: No cyanosis, clubbing or edema.  Peripheral pulses are palpable. Psych: Alert, awake and oriented, normal mood CNS:  No cranial nerve deficits.  Power equal in all extremities.   No cerebellar signs.   Skin: Warm and dry.  No rashes noted.  Labs on Admission:   CBC: Recent Labs  Lab 07/10/20 1145  WBC 15.6*  HGB 14.0  HCT 50.4*  MCV 69.1*  PLT 845*    Basic Metabolic Panel: Recent Labs  Lab 07/10/20 1227  NA 138  K 3.6  CL 105  CO2 19*  GLUCOSE 102*  BUN 17  CREATININE 0.90  CALCIUM 9.1    Liver Function Tests: Recent Labs  Lab 07/10/20 1227  AST 12*  ALT 10  ALKPHOS 88  BILITOT 0.8  PROT 7.7  ALBUMIN 4.6   Recent Labs  Lab 07/10/20 1227  LIPASE 30   No results for input(s): AMMONIA in the last 168 hours.  Cardiac Enzymes: No results for input(s): CKTOTAL, CKMB, CKMBINDEX, TROPONINI in the last 168 hours.  BNP (last 3 results) No results for input(s): BNP in the last 8760 hours.  ProBNP (last 3 results) No results for input(s): PROBNP in the last 8760 hours.  CBG: No results for input(s): GLUCAP in the last 168 hours.  Lipase     Component Value Date/Time   LIPASE 30 07/10/2020 1227     Drugs of Abuse  No results found for: LABOPIA, COCAINSCRNUR, LABBENZ, AMPHETMU, THCU, LABBARB    Radiological Exams on Admission: DG Abdomen 1 View  Result Date: 07/10/2020 CLINICAL DATA:  Abdominal pain, pancreatitis EXAM: ABDOMEN - 1 VIEW COMPARISON:  CT 07/07/2020 FINDINGS: The pelvis is excluded from view. The visualized abdominal gas pattern is nonobstructive with contrast from prior CT examination now identified within the transverse colon. Moderate splenomegaly again noted. No free  intraperitoneal gas. Visualized lung bases are clear. IMPRESSION: Nonvisualization of the pelvis. Normal abdominal gas pattern. Moderate splenomegaly. Electronically Signed   By: Fidela Salisbury MD   On: 07/10/2020 12:32    EKG: Not available for review  Assessment/Plan Principal Problem:   Abdominal pain Active Problems:   GERD (gastroesophageal reflux disease)   Personal history of peptic ulcer disease   Polycythemia vera ([place])  Intractable abdominal pain.  Required IV Dilaudid in the ED, generalized upper abdominal with nausea vomiting.Marland Kitchen  History of biliary dyskinesia status post cholecystectomy.   CT scan showing inflammatory stranding around the pancreaticoduodenal groove but lipase within normal limits.  History of duodenal ulcer not on Protonix.  Possibility of recurrent duodenal ulcer.  Continue Protonix, IV fluids analgesics antiemetics.  Follow GI recommendations.  Patient might need endoscopic evaluation if not clinically improved.  Clear liquids at this time.  N.p.o. after midnight for possible EGD.  History of GERD/duodenal ulcer.  Continue Protonix IV twice daily, clears today.  N.p.o. after midnight.  Might need endoscopic evaluation in a.m.  Antiemetics.  Polycythemia vera.  Splenomegaly on CT scan.  Need to follow-up as outpatient.  CBC in a.m.  Volume depletion, hemoconcentration.  Continue with IV fluid hydration.  CBC in a.m.  DVT Prophylaxis: Lovenox subcu  Consultant: GI  Code Status: Full code  Microbiology none  Antibiotics: None  Family Communication:  Patients' condition and plan of care including tests being ordered have been discussed with the patient who indicate understanding and agree with the plan.   Status is: Observation  The patient remains OBS appropriate and will d/c before 2 midnights.  Dispo: The patient is from: Home              Anticipated d/c is to: Home              Anticipated d/c date is: 1 day              Patient currently is not  medically stable to d/c.   Severity of Illness: The appropriate patient status for this patient is OBSERVATION. Observation status is judged to be reasonable and necessary in order to provide the required intensity of service to ensure the patient's safety. The patient's presenting symptoms, physical exam findings, and  initial radiographic and laboratory data in the context of their medical condition is felt to place them at decreased risk for further clinical deterioration. Furthermore, it is anticipated that the patient will be medically stable for discharge from the hospital within 2 midnights of admission.   Signed, Flora Lipps, MD [facility] 07/10/2020

## 2020-07-11 ENCOUNTER — Observation Stay (HOSPITAL_COMMUNITY): Payer: No Typology Code available for payment source | Admitting: Certified Registered"

## 2020-07-11 ENCOUNTER — Encounter (HOSPITAL_COMMUNITY)
Admission: EM | Disposition: A | Discharge status: Home / Self Care | Payer: Self-pay | Source: Home / Self Care | Attending: Emergency Medicine

## 2020-07-11 ENCOUNTER — Encounter (HOSPITAL_COMMUNITY): Payer: Self-pay | Admitting: Internal Medicine

## 2020-07-11 DIAGNOSIS — Z8711 Personal history of peptic ulcer disease: Secondary | ICD-10-CM | POA: Diagnosis not present

## 2020-07-11 DIAGNOSIS — R1010 Upper abdominal pain, unspecified: Secondary | ICD-10-CM | POA: Diagnosis not present

## 2020-07-11 HISTORY — PX: ESOPHAGOGASTRODUODENOSCOPY (EGD) WITH PROPOFOL: SHX5813

## 2020-07-11 HISTORY — PX: BIOPSY: SHX5522

## 2020-07-11 LAB — COMPREHENSIVE METABOLIC PANEL
ALT: 9 U/L (ref 0–44)
AST: 10 U/L — ABNORMAL LOW (ref 15–41)
Albumin: 3.7 g/dL (ref 3.5–5.0)
Alkaline Phosphatase: 71 U/L (ref 38–126)
Anion gap: 10 (ref 5–15)
BUN: 13 mg/dL (ref 6–20)
CO2: 19 mmol/L — ABNORMAL LOW (ref 22–32)
Calcium: 8.8 mg/dL — ABNORMAL LOW (ref 8.9–10.3)
Chloride: 108 mmol/L (ref 98–111)
Creatinine, Ser: 0.73 mg/dL (ref 0.44–1.00)
GFR, Estimated: >60 mL/min (ref >60)
Glucose, Bld: 81 mg/dL (ref 70–99)
Potassium: 3.6 mmol/L (ref 3.5–5.1)
Sodium: 137 mmol/L (ref 135–145)
Total Bilirubin: 0.9 mg/dL (ref 0.3–1.2)
Total Protein: 6.4 g/dL — ABNORMAL LOW (ref 6.5–8.1)

## 2020-07-11 LAB — CBC
HCT: 40.5 % (ref 36.0–46.0)
Hemoglobin: 11.2 g/dL — ABNORMAL LOW (ref 12.0–15.0)
MCH: 19.5 pg — ABNORMAL LOW (ref 26.0–34.0)
MCHC: 27.7 g/dL — ABNORMAL LOW (ref 30.0–36.0)
MCV: 70.6 fL — ABNORMAL LOW (ref 80.0–100.0)
Platelets: 553 10*3/uL — ABNORMAL HIGH (ref 150–400)
RBC: 5.74 MIL/uL — ABNORMAL HIGH (ref 3.87–5.11)
RDW: 22.3 % — ABNORMAL HIGH (ref 11.5–15.5)
WBC: 9.2 10*3/uL (ref 4.0–10.5)
nRBC: 0 % (ref 0.0–0.2)

## 2020-07-11 LAB — PROTIME-INR
INR: 1.4 — ABNORMAL HIGH (ref 0.8–1.2)
Prothrombin Time: 16.5 seconds — ABNORMAL HIGH (ref 11.4–15.2)

## 2020-07-11 SURGERY — ESOPHAGOGASTRODUODENOSCOPY (EGD) WITH PROPOFOL
Anesthesia: Monitor Anesthesia Care

## 2020-07-11 MED ORDER — PROPOFOL 10 MG/ML IV BOLUS
INTRAVENOUS | Status: AC
  Filled 2020-07-11: qty 20

## 2020-07-11 MED ORDER — PANTOPRAZOLE SODIUM 40 MG PO TBEC: 40.0000 mg | DELAYED_RELEASE_TABLET | Freq: Two times a day (BID) | ORAL | 1 refills | Status: DC

## 2020-07-11 MED ORDER — LACTATED RINGERS IV SOLN: INTRAVENOUS | Status: DC

## 2020-07-11 MED ORDER — PROPOFOL 10 MG/ML IV BOLUS
INTRAVENOUS | Status: DC | PRN
  Administered 2020-07-11 (×3): 10 mg via INTRAVENOUS
  Administered 2020-07-11: 20 mg via INTRAVENOUS

## 2020-07-11 MED ORDER — SUCRALFATE 1 G PO TABS: 1.0000 g | ORAL_TABLET | Freq: Three times a day (TID) | ORAL | 2 refills | Status: DC

## 2020-07-11 MED ORDER — PROPOFOL 500 MG/50ML IV EMUL
INTRAVENOUS | Status: DC | PRN
  Administered 2020-07-11: 145 ug/kg/min via INTRAVENOUS

## 2020-07-11 MED ORDER — SUCRALFATE 1 G PO TABS: 1.0000 g | ORAL_TABLET | Freq: Three times a day (TID) | ORAL | Status: DC

## 2020-07-11 SURGICAL SUPPLY — 15 items

## 2020-07-11 NOTE — Interval H&P Note (Signed)
History and Physical Interval Note:  07/11/2020 2:22 PM  Eileen [name]  has presented today for surgery, with the diagnosis of Epigastric pain, abnormal CT scan.  The various methods of treatment have been discussed with the patient and family. After consideration of risks, benefits and other options for treatment, the patient has consented to  Procedure(s): ESOPHAGOGASTRODUODENOSCOPY (EGD) WITH PROPOFOL (N/A) as a surgical intervention.  The patient's history has been reviewed, patient examined, no change in status, stable for surgery.  I have reviewed the patient's chart and labs.  Questions were answered to the patient's satisfaction.     Nickola Lenig

## 2020-07-11 NOTE — Op Note (Addendum)
[facility] Patient Name: Eileen [name] Procedure Date: 07/11/2020 MRN: [id number] Attending MD: Mauri Pole , MD Date of Birth: 09/02/1970 CSN: [id number] Age: 50 Admit Type: Inpatient Procedure:                Upper GI endoscopy Indications:              Epigastric abdominal pain, Upper abdominal pain,                            Abnormal CT of the GI tract Providers:                Mauri Pole, MD, Erenest Rasher, RN, Benetta Spar, Technician, Maudry Diego, CRNA Referring MD:              Medicines:                Monitored Anesthesia Care Complications:            No immediate complications. Estimated Blood Loss:     Estimated blood loss was minimal. Procedure:                Pre-Anesthesia Assessment:                           - Prior to the procedure, a History and Physical                            was performed, and patient medications and                            allergies were reviewed. The patient's tolerance of                            previous anesthesia was also reviewed. The risks                            and benefits of the procedure and the sedation                            options and risks were discussed with the patient.                            All questions were answered, and informed consent                            was obtained. Prior Anticoagulants: The patient has                            taken no previous anticoagulant or antiplatelet                            agents. ASA Grade Assessment: II - A patient with  mild systemic disease. After reviewing the risks                            and benefits, the patient was deemed in                            satisfactory condition to undergo the procedure.                           After obtaining informed consent, the endoscope was                            passed under direct vision. Throughout the                             procedure, the patient's blood pressure, pulse, and                            oxygen saturations were monitored continuously. The                            GIF-H190 ([id number] Olympus gastroscope was                            introduced through the mouth, and advanced to the                            second part of duodenum. The upper GI endoscopy was                            accomplished without difficulty. The patient                            tolerated the procedure well. Scope In: Scope Out: Findings:      The Z-line was regular and was found 35 cm from the incisors.      No gross lesions were noted in the entire esophagus.      Striped mildly erythematous mucosa without bleeding was found in the       gastric antrum and in the prepyloric region of the stomach. Biopsies       were taken with a cold forceps for Helicobacter pylori testing.      A moderate post-ulcer deformity was found in the first portion of the       duodenum.      One partially obstructing non-bleeding cratered duodenal ulcer with no       stigmata of bleeding was found in the first portion of the duodenum. The       lesion was 10 mm in largest dimension. Impression:               - Z-line regular, 35 cm from the incisors.                           - No gross lesions in esophagus.                           -  Erythematous mucosa in the antrum and prepyloric                            region of the stomach. Biopsied.                           - Duodenal deformity.                           - Partially obstructing non-bleeding duodenal ulcer                            with no stigmata of bleeding. Suspicious for NSAID                            and H. pylori induced etiology. Moderate Sedation:      Not Applicable - Patient had care per Anesthesia. Recommendation:           - Patient has a contact number available for                            emergencies. The signs and symptoms of  potential                            delayed complications were discussed with the                            patient. Return to normal activities tomorrow.                            Written discharge instructions were provided to the                            patient.                           - Resume previous diet.                           - Continue present medications.                           - Await pathology results.                           - Use Protonix (pantoprazole) 40 mg PO BID.                           - Use sucralfate tablets 1 gram PO QID for 1 month.                           - Return to GI office (Dr.Nandigam) in 2 months.                            Will plan for repeat EGD to document healing of  large cratered ulcer and obtain biopsies if                            persistent to exclude neoplasia                           - OK to discharge home from GI standpoint Procedure Code(s):        --- Professional ---                           [phone], Esophagogastroduodenoscopy, flexible,                            transoral; with biopsy, single or multiple Diagnosis Code(s):        --- Professional ---                           K31.89, Other diseases of stomach and duodenum                           K26.9, Duodenal ulcer, unspecified as acute or                            chronic, without hemorrhage or perforation                           R10.13, Epigastric pain                           R10.10, Upper abdominal pain, unspecified                           R93.3, Abnormal findings on diagnostic imaging of                            other parts of digestive tract CPT copyright 2019 [employer]. All rights reserved. The codes documented in this report are preliminary and upon coder review may  be revised to meet current compliance requirements. Mauri Pole, MD 07/11/2020 3:03:53 PM This report has been signed  electronically. Number of Addenda: 0

## 2020-07-11 NOTE — Anesthesia Preprocedure Evaluation (Signed)
Anesthesia Evaluation  Patient identified by MRN, date of birth, ID band Patient awake    Reviewed: Allergy & Precautions, NPO status , Patient's Chart, lab work & pertinent test results  Airway Mallampati: II  TM Distance: >3 FB Neck ROM: Full    Dental no notable dental hx.    Pulmonary neg pulmonary ROS,    Pulmonary exam normal breath sounds clear to auscultation       Cardiovascular Exercise Tolerance: Good negative cardio ROS Normal cardiovascular exam Rhythm:Regular Rate:Normal     Neuro/Psych  Headaches, negative psych ROS   GI/Hepatic Neg liver ROS, PUD, GERD  Medicated,  Endo/Other  obesity  Renal/GU negative Renal ROS  negative genitourinary   Musculoskeletal negative musculoskeletal ROS (+)   Abdominal (+) + obese,   Peds negative pediatric ROS (+)  Hematology  (+) anemia ,   Anesthesia Other Findings   Reproductive/Obstetrics negative OB ROS                             Anesthesia Physical Anesthesia Plan  ASA: II and emergent  Anesthesia Plan: MAC   Post-op Pain Management:    Induction: Intravenous  PONV Risk Score and Plan: 2 and Propofol infusion, TIVA and Treatment may vary due to age or medical condition  Airway Management Planned: Natural Airway and Nasal Cannula  Additional Equipment: None  Intra-op Plan:   Post-operative Plan:   Informed Consent: I have reviewed the patients History and Physical, chart, labs and discussed the procedure including the risks, benefits and alternatives for the proposed anesthesia with the patient or authorized representative who has indicated his/her understanding and acceptance.       Plan Discussed with: CRNA and Anesthesiologist  Anesthesia Plan Comments:         Anesthesia Quick Evaluation

## 2020-07-11 NOTE — Transfer of Care (Signed)
Immediate Anesthesia Transfer of Care Note  Patient: Eileen [name] [facility]  Procedure(s) Performed: ESOPHAGOGASTRODUODENOSCOPY (EGD) WITH PROPOFOL (N/A )  Patient Location: PACU  Anesthesia Type:MAC  Level of Consciousness: awake, alert  and oriented  Airway & Oxygen Therapy: Patient Spontanous Breathing and Patient connected to face mask oxygen  Post-op Assessment: Report given to RN, Post -op Vital signs reviewed and stable and Patient moving all extremities X 4  Post vital signs: Reviewed and stable  Last Vitals:  Vitals Value Taken Time  BP    Temp    Pulse    Resp    SpO2      Last Pain:  Vitals:   07/11/20 1345  TempSrc: Oral  PainSc: 0-No pain         Complications: No complications documented.

## 2020-07-11 NOTE — Anesthesia Procedure Notes (Signed)
Procedure Name: MAC Date/Time: 07/11/2020 2:31 PM Performed by: Niel Hummer, CRNA Pre-anesthesia Checklist: Patient identified, Emergency Drugs available, Suction available and Patient being monitored Oxygen Delivery Method: Simple face mask

## 2020-07-11 NOTE — Discharge Summary (Signed)
Physician Discharge Summary  Eileen [name] [id number] DOB: January 22, 1971 DOA: 07/10/2020  PCP: [name], MD  Admit date: 07/10/2020 Discharge date: 07/11/2020  Admitted From: home Disposition: home Recommendations for Outpatient Follow-up:  1. Follow up with PCP in 1-2 weeks 2. Please obtain BMP/CBC in one week 3. Please follow up with Dr Silverio Decamp  Home Health: None Equipment/Devices: None Discharge Condition: Stable CODE STATUS full code Diet recommendation: Soft bland diet Brief/Interim Summary: Eileen [name] is a 49 y.o. female with past medical history of duodenal ulcer, GERD, polycythemia vera, obesity, migraine headache presented to the hospital with diffuse upper abdominal pain for 7 to 8 days prior to presentation mostly burning in nature better with food and Prilosec.  Pain has been constant stabbing in nature radiating to both sides of the back.  This was associated with nausea vomiting and diarrhea 2 days prior to this presentation.  Patient denies any ingestion of alcohol or NSAIDs.  She did have history of cholecystectomy in November 2021 for biliary dyskinesia after which her pain had subsided.  Because she persisted to have pain she went to see the surgeon on 07/08/2018 who had a CT scan done which showed inflammatory stranding around the pancreaticoduodenal groove with mild thickening suggestive of duodenitis/pancreatitis.  Patient spoke with her surgeon today and since she was having persistent symptoms she was advised for coming to the hospital.  She also had vomiting every day and was unable to keep much down.  Patient does have history of duodenal ulcers for a long time but has not been on any proton pump inhibitors as outpatient  ED Course: In the ED, patient had elevated WBC and hematocrit at 50 and elevated platelets likely secondary to hemoconcentration.  Lipase was at 30.  LFTs were within normal limits.  Patient received IV fluid bolus in the ED, Zofran,  Dilaudid and Protonix 40 mg IV.  GI was consulted and GI recommended admission to the hospital for conservative treatment followed by possible EGD if needed  Discharge Diagnoses:  Principal Problem:   Abdominal pain Active Problems:   GERD (gastroesophageal reflux disease)   Personal history of peptic ulcer disease   Polycythemia vera ([place])  #1 gastritis-patient admitted with intractable abdominal pain.  Had EGD by Dr. Silverio Decamp on 07/11/2020 showed erythematous mucosa in the antrum and prepyloric region of the stomach which was biopsied.  Partially obstructing nonbleeding duodenal ulcer with no stigmata of bleeding.  Suspicious for NSAID and H. pylori induced etiology.  GI recommended to resume previous diet and to start Protonix 40 twice daily and sucralfate 1 g p.o. 4 times daily for 1 month and to return to GI office in 2 months.  GI office to call her with the biopsy reports.   Estimated body mass index is 38.92 kg/m as calculated from the following:   Height as of 05/12/20: 5\' 3"  (1.6 m).   Weight as of 05/12/20: 99.7 kg.  Discharge Instructions  Discharge Instructions    Diet - low sodium heart healthy   Complete by: As directed    Increase activity slowly   Complete by: As directed      Allergies as of 07/11/2020   No Known Allergies     Medication List    TAKE these medications   pantoprazole 40 MG tablet Commonly known as: Protonix Take 1 tablet (40 mg total) by mouth 2 (two) times daily.   sucralfate 1 g tablet Commonly known as: CARAFATE Take 1 tablet (1 g  total) by mouth 4 (four) times daily -  with meals and at bedtime.       Follow-up Information    [name], MD Follow up.   Specialty: Family Medicine Contact information: [place] [address] [phone]        Mauri Pole, MD Follow up.   Specialty: Gastroenterology Contact information: [place] [address] [phone]               No Known Allergies  Consultations: Dr. Silverio Decamp with GI Procedures/Studies: DG Abdomen 1 View  Result Date: 07/10/2020 CLINICAL DATA:  Abdominal pain, pancreatitis EXAM: ABDOMEN - 1 VIEW COMPARISON:  CT 07/07/2020 FINDINGS: The pelvis is excluded from view. The visualized abdominal gas pattern is nonobstructive with contrast from prior CT examination now identified within the transverse colon. Moderate splenomegaly again noted. No free intraperitoneal gas. Visualized lung bases are clear. IMPRESSION: Nonvisualization of the pelvis. Normal abdominal gas pattern. Moderate splenomegaly. Electronically Signed   By: Fidela Salisbury MD   On: 07/10/2020 12:32   CT ABDOMEN PELVIS W CONTRAST  Result Date: 07/07/2020 CLINICAL DATA:  Right upper quadrant pain EXAM: CT ABDOMEN AND PELVIS WITH CONTRAST TECHNIQUE: Multidetector CT imaging of the abdomen and pelvis was performed using the standard protocol following bolus administration of intravenous contrast. CONTRAST:  159mL OMNIPAQUE IOHEXOL 300 MG/ML  SOLN COMPARISON:  CT 11/17/2015, nuclear medicine imaging 12/24/2019 FINDINGS: Lower chest: Lung bases demonstrate no acute consolidation or pleural effusion. Normal cardiac size. Hepatobiliary: Status post cholecystectomy. No biliary dilatation. No focal hepatic abnormality. Pancreas: Fatty replacement. Inflammatory process at the pancreaticoduodenal groove. No ductal dilatation Spleen: Enlarged, measuring 16 cm AP and 17 cm craniocaudad. Adrenals/Urinary Tract: Adrenal glands are unremarkable. Kidneys are normal, without renal calculi, focal lesion, or hydronephrosis. Bladder is unremarkable. Stomach/Bowel: The stomach is nonenlarged. Wall thickening at the second portion of duodenum. No dilated small bowel. Negative appendix. Vascular/Lymphatic: Nonaneurysmal aorta. Focal mural thickening or noncalcified plaque of the infrarenal abdominal aorta. No significant calcifications. No suspicious nodes Reproductive:  Status post hysterectomy. No adnexal masses. Other: Negative for free air or free fluid. Musculoskeletal: No acute or significant osseous findings. IMPRESSION: 1. Inflammatory process at the pancreaticoduodenal groove with mild wall thickening of the second portion of duodenum. Primary differential includes acute pancreatitis versus duodenitis. No free air to suggest perforation. 2. Splenomegaly. These results will be called to the ordering clinician or representative by the Radiologist Assistant, and communication documented in the PACS or [employer]. Aortic Atherosclerosis (ICD10-I70.0). Electronically Signed   By: Donavan Foil M.D.   On: 07/07/2020 22:37    (Echo, Carotid, EGD, Colonoscopy, ERCP)    Subjective:  Patient had EGD today but she has not had anything to eat Discharge Exam: Vitals:   07/11/20 1520 07/11/20 1533  BP:  128/81  Pulse: 87 82  Resp: 15 17  Temp:  98 F (36.7 C)  SpO2: 100% 100%   Vitals:   07/11/20 1500 07/11/20 1510 07/11/20 1520 07/11/20 1533  BP: 130/65   128/81  Pulse: (!) 110 92 87 82  Resp: (!) 24 (!) 24 15 17   Temp: 98.1 F (36.7 C)   98 F (36.7 C)  TempSrc:    Oral  SpO2: 98% 100% 100% 100%    General: Pt is alert, awake, not in acute distress Cardiovascular: RRR, S1/S2 +, no rubs, no gallops Respiratory: CTA bilaterally, no wheezing, no rhonchi Abdominal: Soft, NT, ND, bowel sounds +  Extremities: no edema, no cyanosis    The results of significant diagnostics from this hospitalization (including imaging, microbiology, ancillary and laboratory) are listed below for reference.     Microbiology: Recent Results (from the past 240 hour(s))  SARS Coronavirus 2 by RT PCR (hospital order, performed in [facility] hospital lab) Nasopharyngeal Nasopharyngeal Swab     Status: None   Collection Time: 07/10/20  5:53 PM   Specimen: Nasopharyngeal Swab  Result Value Ref Range Status   SARS Coronavirus 2 NEGATIVE NEGATIVE Final    Comment:  (NOTE) SARS-CoV-2 target nucleic acids are NOT DETECTED.  The SARS-CoV-2 RNA is generally detectable in upper and lower respiratory specimens during the acute phase of infection. The lowest concentration of SARS-CoV-2 viral copies this assay can detect is 250 copies / mL. A negative result does not preclude SARS-CoV-2 infection and should not be used as the sole basis for treatment or other patient management decisions.  A negative result may occur with improper specimen collection / handling, submission of specimen other than nasopharyngeal swab, presence of viral mutation(s) within the areas targeted by this assay, and inadequate number of viral copies (<250 copies / mL). A negative result must be combined with clinical observations, patient history, and epidemiological information.  Fact Sheet for Patients:   StrictlyIdeas.no  Fact Sheet for Healthcare Providers: BankingDealers.co.za  This test is not yet approved or  cleared by the [place] FDA and has been authorized for detection and/or diagnosis of SARS-CoV-2 by FDA under an Emergency Use Authorization (EUA).  This EUA will remain in effect (meaning this test can be used) for the duration of the COVID-19 declaration under Section 564(b)(1) of the Act, 21 U.S.C. section 360bbb-3(b)(1), unless the authorization is terminated or revoked sooner.  Performed at [facility], [place] [address] [place], [place] [address]      Labs: BNP (last 3 results) No results for input(s): BNP in the last 8760 hours. Basic Metabolic Panel: Recent Labs  Lab 07/10/20 1227 07/11/20 0518  NA 138 137  K 3.6 3.6  CL 105 108  CO2 19* 19*  GLUCOSE 102* 81  BUN 17 13  CREATININE 0.90 0.73  CALCIUM 9.1 8.8*   Liver Function Tests: Recent Labs  Lab 07/10/20 1227 07/11/20 0518  AST 12* 10*  ALT 10 9  ALKPHOS 88 71  BILITOT 0.8 0.9  PROT 7.7 6.4*  ALBUMIN 4.6 3.7    Recent Labs  Lab 07/10/20 1227  LIPASE 30   No results for input(s): AMMONIA in the last 168 hours. CBC: Recent Labs  Lab 07/10/20 1145 07/11/20 0518  WBC 15.6* 9.2  HGB 14.0 11.2*  HCT 50.4* 40.5  MCV 69.1* 70.6*  PLT 845* 553*   Cardiac Enzymes: No results for input(s): CKTOTAL, CKMB, CKMBINDEX, TROPONINI in the last 168 hours. BNP: Invalid input(s): POCBNP CBG: No results for input(s): GLUCAP in the last 168 hours. D-Dimer No results for input(s): DDIMER in the last 72 hours. Hgb A1c No results for input(s): HGBA1C in the last 72 hours. Lipid Profile No results for input(s): CHOL, HDL, LDLCALC, TRIG, CHOLHDL, LDLDIRECT in the last 72 hours. Thyroid function studies No results for input(s): TSH, T4TOTAL, T3FREE, THYROIDAB in the last 72 hours.  Invalid input(s): FREET3 Anemia work up No results for input(s): VITAMINB12, FOLATE, FERRITIN, TIBC, IRON, RETICCTPCT in the last 72 hours. Urinalysis    Component Value Date/Time   COLORURINE YELLOW 07/10/2020 2013   APPEARANCEUR HAZY (A) 07/10/2020 2013  LABSPEC 1.030 07/10/2020 2013   PHURINE 5.0 07/10/2020 2013   GLUCOSEU NEGATIVE 07/10/2020 2013   GLUCOSEU NEGATIVE 03/25/2007 [place] 07/10/2020 2013   BILIRUBINUR NEGATIVE 07/10/2020 2013   KETONESUR 80 (A) 07/10/2020 2013   PROTEINUR 30 (A) 07/10/2020 2013   UROBILINOGEN 0.2 mg/dL 03/25/2007 1130   NITRITE NEGATIVE 07/10/2020 2013   LEUKOCYTESUR NEGATIVE 07/10/2020 2013   Sepsis Labs Invalid input(s): PROCALCITONIN,  WBC,  LACTICIDVEN Microbiology Recent Results (from the past 240 hour(s))  SARS Coronavirus 2 by RT PCR (hospital order, performed in [place] hospital lab) Nasopharyngeal Nasopharyngeal Swab     Status: None   Collection Time: 07/10/20  5:53 PM   Specimen: Nasopharyngeal Swab  Result Value Ref Range Status   SARS Coronavirus 2 NEGATIVE NEGATIVE Final    Comment: (NOTE) SARS-CoV-2 target nucleic acids are NOT  DETECTED.  The SARS-CoV-2 RNA is generally detectable in upper and lower respiratory specimens during the acute phase of infection. The lowest concentration of SARS-CoV-2 viral copies this assay can detect is 250 copies / mL. A negative result does not preclude SARS-CoV-2 infection and should not be used as the sole basis for treatment or other patient management decisions.  A negative result may occur with improper specimen collection / handling, submission of specimen other than nasopharyngeal swab, presence of viral mutation(s) within the areas targeted by this assay, and inadequate number of viral copies (<250 copies / mL). A negative result must be combined with clinical observations, patient history, and epidemiological information.  Fact Sheet for Patients:   StrictlyIdeas.no  Fact Sheet for Healthcare Providers: BankingDealers.co.za  This test is not yet approved or  cleared by the [place] FDA and has been authorized for detection and/or diagnosis of SARS-CoV-2 by FDA under an Emergency Use Authorization (EUA).  This EUA will remain in effect (meaning this test can be used) for the duration of the COVID-19 declaration under Section 564(b)(1) of the Act, 21 U.S.C. section 360bbb-3(b)(1), unless the authorization is terminated or revoked sooner.  Performed at [facility], [place] [address] [place], [place] [address]      Time coordinating discharge: 38 minutes  SIGNED:   Georgette Shell, MD  [facility] 07/11/2020, 4:56 PM

## 2020-07-11 NOTE — Progress Notes (Signed)
Patient was given discharge instructions, and all questions were answered. Patient was stable for discharge and was walked to the main exit. 

## 2020-07-11 NOTE — Progress Notes (Addendum)
[facility] Progress Note  CC:  Abdominal pain and abnormal CT scan  Subjective:  Feeling better but still with some pain and is nervous that the pain will come back again.  No further nausea or vomiting.  Objective:  Vital signs in last 24 hours: Temp:  [98.1 F (36.7 C)-98.6 F (37 C)] 98.3 F (36.8 C) (02/01 0611) Pulse Rate:  [83-121] 83 (02/01 0611) Resp:  [14-18] 16 (02/01 0611) BP: (109-155)/(70-98) 121/72 (02/01 0611) SpO2:  [97 %-100 %] 97 % (02/01 0611) Last BM Date: 07/08/20 General:  Alert, Well-developed, in NAD Heart:  Regular rate and rhythm; no murmurs Pulm:  CTAB.  No W/R/R. Abdomen:  Soft, non-distended.  BS present.  Mild epigastric TTP. Extremities:  Without edema. Neurologic:  Alert and oriented x 4;  grossly normal neurologically. Psych:  Alert and cooperative. Normal mood and affect.  Intake/Output from previous day: 01/31 0701 - 02/01 0700 In: 1240 [P.O.:240; IV Piggyback:1000] Out: 0   Lab Results: Recent Labs    07/10/20 1145 07/11/20 0518  WBC 15.6* 9.2  HGB 14.0 11.2*  HCT 50.4* 40.5  PLT 845* 553*   BMET Recent Labs    07/10/20 1227 07/11/20 0518  NA 138 137  K 3.6 3.6  CL 105 108  CO2 19* 19*  GLUCOSE 102* 81  BUN 17 13  CREATININE 0.90 0.73  CALCIUM 9.1 8.8*   LFT Recent Labs    07/11/20 0518  PROT 6.4*  ALBUMIN 3.7  AST 10*  ALT 9  ALKPHOS 71  BILITOT 0.9   PT/INR Recent Labs    07/11/20 0518  LABPROT 16.5*  INR 1.4*   DG Abdomen 1 View  Result Date: 07/10/2020 CLINICAL DATA:  Abdominal pain, pancreatitis EXAM: ABDOMEN - 1 VIEW COMPARISON:  CT 07/07/2020 FINDINGS: The pelvis is excluded from view. The visualized abdominal gas pattern is nonobstructive with contrast from prior CT examination now identified within the transverse colon. Moderate splenomegaly again noted. No free intraperitoneal gas. Visualized lung bases are clear. IMPRESSION: Nonvisualization of the pelvis. Normal abdominal  gas pattern. Moderate splenomegaly. Electronically Signed   By: Fidela Salisbury MD   On: 07/10/2020 12:32   Assessment / Plan: # 50 yo female with 8 day history of stabbing upper abdominal pain radiating around both upper sides into her back. Nausea, vomiting, diarrhea for last couple of days. CT scan shows inflammatory process at the pancreaticoduodenal groove with wall thickening of second portion of duodenum. Lipase normal.  She could have pancreatitis with reactive changes in duodenum vrs duodenal ulcer / duodenitis causing reactive changes in adjacent pancreas.  --IV BID PPI --Hx of duodenal ulcers ( ? Maybe NSAID related). Check H.pylori IgG.  --Likely EGD this afternoon.  # Iron deficiency with ferritin of 4. Hgb down to 11.2 this AM after IV fluids. Her baseline hgb is ~ 12. She has polycythemia vera, gets phlebotomies ( last one in October 2021)  # Splenomegaly. Secondary to polycythemia vera?   LOS: 0 days   Laban Emperor. Zehr  07/11/2020, 9:30 AM    Attending physician's note   I have taken an interval history, reviewed the chart and examined the patient. I agree with the Advanced Practitioner's note, impression and recommendations.   Will plan to proceed with EGD for further evaluation of duodenal thickening and abdominal pain to exclude duodenal ulcer. The risks and benefits as well as alternatives of endoscopic procedure(s) have been discussed and reviewed. All questions answered. The patient  agrees to proceed.   The patient was provided an opportunity to ask questions and all were answered. The patient agreed with the plan and demonstrated an understanding of the instructions.  [name] , MD [phone]

## 2020-07-12 ENCOUNTER — Other Ambulatory Visit: Payer: Self-pay

## 2020-07-12 LAB — SURGICAL PATHOLOGY

## 2020-07-12 LAB — H. PYLORI ANTIBODY, IGG: H Pylori IgG: 0.41 Index Value (ref 0.00–0.79)

## 2020-07-12 NOTE — Anesthesia Postprocedure Evaluation (Signed)
Anesthesia Post Note  Patient: [name] [facility]  Procedure(s) Performed: ESOPHAGOGASTRODUODENOSCOPY (EGD) WITH PROPOFOL (N/A )     Patient location during evaluation: Endoscopy Anesthesia Type: MAC Level of consciousness: awake and alert Pain management: pain level controlled Vital Signs Assessment: post-procedure vital signs reviewed and stable Respiratory status: spontaneous breathing, nonlabored ventilation and respiratory function stable Cardiovascular status: blood pressure returned to baseline and stable Postop Assessment: no apparent nausea or vomiting Anesthetic complications: no   No complications documented.  Last Vitals:  Vitals:   07/11/20 1520 07/11/20 1533  BP:  128/81  Pulse: 87 82  Resp: 15 17  Temp:  36.7 C  SpO2: 100% 100%    Last Pain:  Vitals:   07/11/20 1533  TempSrc: Oral  PainSc: 0-No pain                 Merlinda Frederick

## 2020-07-13 ENCOUNTER — Encounter (HOSPITAL_COMMUNITY): Payer: Self-pay | Admitting: Gastroenterology

## 2020-07-17 ENCOUNTER — Inpatient Hospital Stay: Payer: No Typology Code available for payment source | Attending: Oncology

## 2020-07-17 ENCOUNTER — Inpatient Hospital Stay: Payer: No Typology Code available for payment source

## 2020-07-25 ENCOUNTER — Encounter: Payer: Self-pay | Admitting: Gastroenterology

## 2020-08-11 ENCOUNTER — Inpatient Hospital Stay: Payer: No Typology Code available for payment source

## 2020-08-11 ENCOUNTER — Inpatient Hospital Stay (HOSPITAL_BASED_OUTPATIENT_CLINIC_OR_DEPARTMENT_OTHER): Payer: No Typology Code available for payment source | Admitting: Oncology

## 2020-08-11 ENCOUNTER — Inpatient Hospital Stay: Payer: No Typology Code available for payment source | Attending: Oncology

## 2020-08-11 ENCOUNTER — Other Ambulatory Visit: Payer: Self-pay

## 2020-08-11 VITALS — BP 129/71 | HR 80 | Temp 97.0°F | Resp 18 | Ht 63.0 in | Wt 225.3 lb

## 2020-08-11 DIAGNOSIS — E611 Iron deficiency: Secondary | ICD-10-CM | POA: Diagnosis not present

## 2020-08-11 DIAGNOSIS — D500 Iron deficiency anemia secondary to blood loss (chronic): Secondary | ICD-10-CM

## 2020-08-11 DIAGNOSIS — D45 Polycythemia vera: Secondary | ICD-10-CM | POA: Diagnosis present

## 2020-08-11 LAB — CBC WITH DIFFERENTIAL (CANCER CENTER ONLY)
Abs Immature Granulocytes: 0.06 10*3/uL (ref 0.00–0.07)
Basophils Absolute: 0.2 10*3/uL — ABNORMAL HIGH (ref 0.0–0.1)
Basophils Relative: 2 %
Eosinophils Absolute: 0.5 10*3/uL (ref 0.0–0.5)
Eosinophils Relative: 6 %
HCT: 44.8 % (ref 36.0–46.0)
Hemoglobin: 12.5 g/dL (ref 12.0–15.0)
Immature Granulocytes: 1 %
Lymphocytes Relative: 22 %
Lymphs Abs: 2 10*3/uL (ref 0.7–4.0)
MCH: 19.1 pg — ABNORMAL LOW (ref 26.0–34.0)
MCHC: 27.9 g/dL — ABNORMAL LOW (ref 30.0–36.0)
MCV: 68.3 fL — ABNORMAL LOW (ref 80.0–100.0)
Monocytes Absolute: 0.4 10*3/uL (ref 0.1–1.0)
Monocytes Relative: 4 %
Neutro Abs: 5.9 10*3/uL (ref 1.7–7.7)
Neutrophils Relative %: 65 %
Platelet Count: 719 10*3/uL — ABNORMAL HIGH (ref 150–400)
RBC: 6.56 MIL/uL — ABNORMAL HIGH (ref 3.87–5.11)
RDW: 22.8 % — ABNORMAL HIGH (ref 11.5–15.5)
WBC Count: 9.1 10*3/uL (ref 4.0–10.5)
nRBC: 0 % (ref 0.0–0.2)

## 2020-08-11 MED ORDER — FERROUS SULFATE 325 (65 FE) MG PO TBEC: 325.0000 mg | DELAYED_RELEASE_TABLET | Freq: Every day | ORAL | 3 refills | Status: DC

## 2020-08-11 NOTE — Progress Notes (Signed)
Hematology and Oncology Follow Up Visit  Eileen [name] [id number] 08-Mar-1971 50 y.o. 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  Current therapy:   Therapeutic phlebotomy every 4 weeks to keep her hematocrit less than 45.    Interim History: Eileen [name] returns today for repeat evaluation.  Since her last visit, she reports no major changes in her health.  She has reported some fatigue and tiredness but no headaches or thrombosis episodes.  She has diagnosis of peptic ulcer and started on Carafate and Protonix.  She denies any hematochezia or melena.  She denies any constipation or diarrhea.     Medications: Updated on review. Current Outpatient Medications  Medication Sig Dispense Refill  . pantoprazole (PROTONIX) 40 MG tablet Take 1 tablet (40 mg total) by mouth 2 (two) times daily. 30 tablet 1  . sucralfate (CARAFATE) 1 g tablet Take 1 tablet (1 g total) by mouth 4 (four) times daily -  with meals and at bedtime. 90 tablet 2   No current facility-administered medications for this visit.   Facility-Administered Medications Ordered in Other Visits  Medication Dose Route Frequency Provider Last Rate Last Admin  . gadopentetate dimeglumine (MAGNEVIST) injection 18 mL  18 mL Intravenous Once PRN Melvenia Beam, MD         Allergies: No Known Allergies    Physical Exam:    Blood pressure 129/71, pulse 80, temperature (!) 97 F (36.1 C), temperature source Tympanic, resp. rate 18, height 5\' 3"  (1.6 m), weight 225 lb 4.8 oz (102.2 kg), SpO2 100 %.      ECOG: 0    General appearance: Alert, awake without any distress. Head: Atraumatic without abnormalities Oropharynx: Without any thrush or ulcers. Eyes: No scleral icterus. Lymph nodes: No  lymphadenopathy noted in the cervical, supraclavicular, or axillary nodes Heart:regular rate and rhythm, without any murmurs or gallops.   Lung: Clear to auscultation without any rhonchi, wheezes or dullness to percussion. Abdomin: Soft, nontender without any shifting dullness or ascites. Musculoskeletal: No clubbing or cyanosis. Neurological: No motor or sensory deficits. Skin: No rashes or lesions.          Lab Results: Lab Results  Component Value Date   WBC 9.2 07/11/2020   HGB 11.2 (L) 07/11/2020   HCT 40.5 07/11/2020   MCV 70.6 (L) 07/11/2020   PLT 553 (H) 07/11/2020     Chemistry      Component Value Date/Time   NA 137 07/11/2020 0518   NA 140 02/20/2016 1432   K 3.6 07/11/2020 0518   K 4.1 02/20/2016 1432   CL 108 07/11/2020 0518   CO2 19 (L) 07/11/2020 0518   CO2 25 02/20/2016 1432   BUN 13 07/11/2020 0518   BUN 11.1 02/20/2016 1432   CREATININE 0.73 07/11/2020 0518   CREATININE 1.0 02/20/2016 1432      Component Value Date/Time   CALCIUM 8.8 (L) 07/11/2020 0518   CALCIUM 9.8 02/20/2016 1432   ALKPHOS 71 07/11/2020 0518   ALKPHOS 115 02/20/2016 1432   AST 10 (L) 07/11/2020 0518   AST 13 02/20/2016 1432   ALT 9 07/11/2020 0518   ALT 13 02/20/2016 1432   BILITOT 0.9 07/11/2020 0518   BILITOT 0.71 02/20/2016 1432      Impression and Plan:  50 year old woman with:  1.  Polycythemia vera diagnosed in 2017.  She was found to have JAK2 positive mutation..    The natural course of her disease was reviewed at this time and treatment options were reiterated.  Hemoglobin today is within range and does not require any phlebotomy at this time.  We will continue to monitor on a monthly basis.   2. Thrombosis prophylaxis: Risk of thrombosis was reviewed and currently low at this time.  3. Cytoreductive therapy: No indication for hydroxyurea at this time.  Her thrombocytosis is likely reactive to her iron deficiency.  4.  Iron deficiency: Risks and benefits  of iron replacement therapy were discussed.  I am in favor of starting therapy at this time given her symptoms of fatigue and thrombocytosis.  Given her peptic ulcer disease and will be challenging to use oral iron but we will attempt 325 mg daily separated from her antiacid medication.  6. Follow-up: She will continue to follow monthly for lab and phlebotomy MD follow-up in 4 months.  30  minutes were dedicated to this encounter.  Time was spent on reviewing laboratory data, disease status update and treatment options for the future.   Zola Button, MD 3/4/20229:45 AM

## 2020-09-15 ENCOUNTER — Other Ambulatory Visit: Payer: Self-pay

## 2020-09-15 ENCOUNTER — Inpatient Hospital Stay: Payer: No Typology Code available for payment source

## 2020-09-15 ENCOUNTER — Inpatient Hospital Stay: Payer: No Typology Code available for payment source | Attending: Oncology

## 2020-09-15 DIAGNOSIS — D45 Polycythemia vera: Secondary | ICD-10-CM | POA: Diagnosis present

## 2020-09-15 DIAGNOSIS — D500 Iron deficiency anemia secondary to blood loss (chronic): Secondary | ICD-10-CM

## 2020-09-15 LAB — IRON AND TIBC
Iron: 19 ug/dL — ABNORMAL LOW (ref 41–142)
Saturation Ratios: 6 % — ABNORMAL LOW (ref 21–57)
TIBC: 346 ug/dL (ref 236–444)
UIBC: 327 ug/dL (ref 120–384)

## 2020-09-15 LAB — CBC WITH DIFFERENTIAL (CANCER CENTER ONLY)
Abs Immature Granulocytes: 0.06 10*3/uL (ref 0.00–0.07)
Basophils Absolute: 0.2 10*3/uL — ABNORMAL HIGH (ref 0.0–0.1)
Basophils Relative: 2 %
Eosinophils Absolute: 0.7 10*3/uL — ABNORMAL HIGH (ref 0.0–0.5)
Eosinophils Relative: 7 %
HCT: 44.7 % (ref 36.0–46.0)
Hemoglobin: 12.8 g/dL (ref 12.0–15.0)
Immature Granulocytes: 1 %
Lymphocytes Relative: 20 %
Lymphs Abs: 1.9 10*3/uL (ref 0.7–4.0)
MCH: 19.5 pg — ABNORMAL LOW (ref 26.0–34.0)
MCHC: 28.6 g/dL — ABNORMAL LOW (ref 30.0–36.0)
MCV: 68.1 fL — ABNORMAL LOW (ref 80.0–100.0)
Monocytes Absolute: 0.5 10*3/uL (ref 0.1–1.0)
Monocytes Relative: 5 %
Neutro Abs: 6 10*3/uL (ref 1.7–7.7)
Neutrophils Relative %: 65 %
Platelet Count: 635 10*3/uL — ABNORMAL HIGH (ref 150–400)
RBC: 6.56 MIL/uL — ABNORMAL HIGH (ref 3.87–5.11)
RDW: 22.5 % — ABNORMAL HIGH (ref 11.5–15.5)
WBC Count: 9.3 10*3/uL (ref 4.0–10.5)
nRBC: 0 % (ref 0.0–0.2)

## 2020-09-15 LAB — FERRITIN: Ferritin: 4 ng/mL — ABNORMAL LOW (ref 11–307)

## 2020-09-15 NOTE — Progress Notes (Unsigned)
Patient here today

## 2020-10-16 ENCOUNTER — Other Ambulatory Visit: Payer: Self-pay

## 2020-10-16 ENCOUNTER — Inpatient Hospital Stay: Payer: No Typology Code available for payment source | Attending: Oncology

## 2020-10-16 ENCOUNTER — Inpatient Hospital Stay: Payer: No Typology Code available for payment source

## 2020-10-16 DIAGNOSIS — D45 Polycythemia vera: Secondary | ICD-10-CM | POA: Diagnosis present

## 2020-10-16 DIAGNOSIS — D500 Iron deficiency anemia secondary to blood loss (chronic): Secondary | ICD-10-CM

## 2020-10-16 LAB — CBC WITH DIFFERENTIAL (CANCER CENTER ONLY)
Abs Immature Granulocytes: 0.05 10*3/uL (ref 0.00–0.07)
Basophils Absolute: 0.2 10*3/uL — ABNORMAL HIGH (ref 0.0–0.1)
Basophils Relative: 3 %
Eosinophils Absolute: 0.4 10*3/uL (ref 0.0–0.5)
Eosinophils Relative: 5 %
HCT: 46.4 % — ABNORMAL HIGH (ref 36.0–46.0)
Hemoglobin: 13 g/dL (ref 12.0–15.0)
Immature Granulocytes: 1 %
Lymphocytes Relative: 20 %
Lymphs Abs: 1.9 10*3/uL (ref 0.7–4.0)
MCH: 19.4 pg — ABNORMAL LOW (ref 26.0–34.0)
MCHC: 28 g/dL — ABNORMAL LOW (ref 30.0–36.0)
MCV: 69.3 fL — ABNORMAL LOW (ref 80.0–100.0)
Monocytes Absolute: 0.4 10*3/uL (ref 0.1–1.0)
Monocytes Relative: 4 %
Neutro Abs: 6.4 10*3/uL (ref 1.7–7.7)
Neutrophils Relative %: 67 %
Platelet Count: 653 10*3/uL — ABNORMAL HIGH (ref 150–400)
RBC: 6.7 MIL/uL — ABNORMAL HIGH (ref 3.87–5.11)
RDW: 22.3 % — ABNORMAL HIGH (ref 11.5–15.5)
WBC Count: 9.4 10*3/uL (ref 4.0–10.5)
nRBC: 0 % (ref 0.0–0.2)

## 2020-10-16 LAB — IRON AND TIBC
Iron: 24 ug/dL — ABNORMAL LOW (ref 41–142)
Saturation Ratios: 7 % — ABNORMAL LOW (ref 21–57)
TIBC: 363 ug/dL (ref 236–444)
UIBC: 339 ug/dL (ref 120–384)

## 2020-10-16 LAB — FERRITIN: Ferritin: 6 ng/mL — ABNORMAL LOW (ref 11–307)

## 2020-10-16 NOTE — Patient Instructions (Signed)

## 2020-10-16 NOTE — Progress Notes (Signed)
Patient came to infusion clinic for therapeutic phlebotomy per MD orders. This RN attempted x2 in [facility] with 18G with no success. Junious Dresser., RN attempted once in [facility] with 18G and removed 109g. Patient left with no distress and vitals were stable upon discharge. Patient was advised that a future appointment will be rescheduled for therapeutic phlebotomy. Patient verbalized understanding.

## 2020-11-17 ENCOUNTER — Other Ambulatory Visit: Payer: Self-pay

## 2020-11-17 ENCOUNTER — Inpatient Hospital Stay: Payer: No Typology Code available for payment source

## 2020-11-17 ENCOUNTER — Inpatient Hospital Stay: Payer: No Typology Code available for payment source | Attending: Oncology

## 2020-11-17 ENCOUNTER — Encounter: Payer: Self-pay | Admitting: Oncology

## 2020-11-17 VITALS — BP 145/87 | HR 91 | Temp 98.1°F | Resp 18 | Wt 224.0 lb

## 2020-11-17 DIAGNOSIS — D500 Iron deficiency anemia secondary to blood loss (chronic): Secondary | ICD-10-CM

## 2020-11-17 DIAGNOSIS — D45 Polycythemia vera: Secondary | ICD-10-CM | POA: Diagnosis present

## 2020-11-17 LAB — IRON AND TIBC
Iron: 18 ug/dL — ABNORMAL LOW (ref 41–142)
Saturation Ratios: 5 % — ABNORMAL LOW (ref 21–57)
TIBC: 343 ug/dL (ref 236–444)
UIBC: 324 ug/dL (ref 120–384)

## 2020-11-17 LAB — CBC WITH DIFFERENTIAL (CANCER CENTER ONLY)
Abs Immature Granulocytes: 0.07 10*3/uL (ref 0.00–0.07)
Basophils Absolute: 0.2 10*3/uL — ABNORMAL HIGH (ref 0.0–0.1)
Basophils Relative: 2 %
Eosinophils Absolute: 0.4 10*3/uL (ref 0.0–0.5)
Eosinophils Relative: 4 %
HCT: 46.2 % — ABNORMAL HIGH (ref 36.0–46.0)
Hemoglobin: 13.1 g/dL (ref 12.0–15.0)
Immature Granulocytes: 1 %
Lymphocytes Relative: 21 %
Lymphs Abs: 2.1 10*3/uL (ref 0.7–4.0)
MCH: 19.4 pg — ABNORMAL LOW (ref 26.0–34.0)
MCHC: 28.4 g/dL — ABNORMAL LOW (ref 30.0–36.0)
MCV: 68.5 fL — ABNORMAL LOW (ref 80.0–100.0)
Monocytes Absolute: 0.5 10*3/uL (ref 0.1–1.0)
Monocytes Relative: 5 %
Neutro Abs: 6.7 10*3/uL (ref 1.7–7.7)
Neutrophils Relative %: 67 %
Platelet Count: 720 10*3/uL — ABNORMAL HIGH (ref 150–400)
RBC: 6.74 MIL/uL — ABNORMAL HIGH (ref 3.87–5.11)
RDW: 22.4 % — ABNORMAL HIGH (ref 11.5–15.5)
WBC Count: 10 10*3/uL (ref 4.0–10.5)
nRBC: 0 % (ref 0.0–0.2)

## 2020-11-17 LAB — FERRITIN: Ferritin: <4 ng/mL — ABNORMAL LOW (ref 11–307)

## 2020-11-17 NOTE — Progress Notes (Signed)
Eileen [name] presents today for phlebotomy per MD orders. Phlebotomy procedure started at 1328 and ended at 1332 using 16g phlebotomy kit.  557 grams removed.  Patient observed for 30 minutes after procedure without any incident.  Refreshments offered and accepted.  Patient tolerated procedure well.  IV needle removed intact.  VSS.

## 2020-11-17 NOTE — Patient Instructions (Signed)

## 2020-12-19 ENCOUNTER — Inpatient Hospital Stay: Payer: No Typology Code available for payment source | Attending: Oncology

## 2020-12-19 ENCOUNTER — Other Ambulatory Visit: Payer: Self-pay

## 2020-12-19 ENCOUNTER — Inpatient Hospital Stay (HOSPITAL_BASED_OUTPATIENT_CLINIC_OR_DEPARTMENT_OTHER): Payer: No Typology Code available for payment source | Admitting: Oncology

## 2020-12-19 ENCOUNTER — Inpatient Hospital Stay: Payer: No Typology Code available for payment source

## 2020-12-19 VITALS — BP 139/93 | HR 88 | Temp 98.3°F | Resp 19 | Wt 223.7 lb

## 2020-12-19 DIAGNOSIS — D45 Polycythemia vera: Secondary | ICD-10-CM

## 2020-12-19 DIAGNOSIS — E611 Iron deficiency: Secondary | ICD-10-CM | POA: Insufficient documentation

## 2020-12-19 DIAGNOSIS — M79606 Pain in leg, unspecified: Secondary | ICD-10-CM | POA: Diagnosis not present

## 2020-12-19 DIAGNOSIS — D500 Iron deficiency anemia secondary to blood loss (chronic): Secondary | ICD-10-CM

## 2020-12-19 LAB — CBC WITH DIFFERENTIAL (CANCER CENTER ONLY)
Abs Immature Granulocytes: 0.1 10*3/uL — ABNORMAL HIGH (ref 0.00–0.07)
Basophils Absolute: 0.2 10*3/uL — ABNORMAL HIGH (ref 0.0–0.1)
Basophils Relative: 2 %
Eosinophils Absolute: 0.4 10*3/uL (ref 0.0–0.5)
Eosinophils Relative: 4 %
HCT: 43.9 % (ref 36.0–46.0)
Hemoglobin: 12.6 g/dL (ref 12.0–15.0)
Immature Granulocytes: 1 %
Lymphocytes Relative: 16 %
Lymphs Abs: 1.7 10*3/uL (ref 0.7–4.0)
MCH: 19.2 pg — ABNORMAL LOW (ref 26.0–34.0)
MCHC: 28.7 g/dL — ABNORMAL LOW (ref 30.0–36.0)
MCV: 66.9 fL — ABNORMAL LOW (ref 80.0–100.0)
Monocytes Absolute: 0.6 10*3/uL (ref 0.1–1.0)
Monocytes Relative: 5 %
Neutro Abs: 7.4 10*3/uL (ref 1.7–7.7)
Neutrophils Relative %: 72 %
Platelet Count: 585 10*3/uL — ABNORMAL HIGH (ref 150–400)
RBC: 6.56 MIL/uL — ABNORMAL HIGH (ref 3.87–5.11)
RDW: 22.4 % — ABNORMAL HIGH (ref 11.5–15.5)
WBC Count: 10.3 10*3/uL (ref 4.0–10.5)
nRBC: 0 % (ref 0.0–0.2)

## 2020-12-19 LAB — IRON AND TIBC
Iron: 18 ug/dL — ABNORMAL LOW (ref 41–142)
Saturation Ratios: 5 % — ABNORMAL LOW (ref 21–57)
TIBC: 339 ug/dL (ref 236–444)
UIBC: 321 ug/dL (ref 120–384)

## 2020-12-19 LAB — FERRITIN: Ferritin: 4 ng/mL — ABNORMAL LOW (ref 11–307)

## 2020-12-19 MED ORDER — GABAPENTIN 100 MG PO CAPS: 100.0000 mg | ORAL_CAPSULE | Freq: Every day | ORAL | 3 refills | Status: DC

## 2020-12-19 NOTE — Progress Notes (Signed)
Hematology and Oncology Follow Up Visit  Eileen [name] [id number] 06/08/1971 50 y.o. 12/19/2020 9:49 AM Darron Doom [name], MDRichter, [name], MD   Principle Diagnosis: 50 year old woman with JAK2 positive polycythemia vera diagnosed in 2017.  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  Current therapy:   Therapeutic phlebotomy every 4 weeks to keep her hematocrit less than 45.    Interim History: Eileen [name] is here for a follow-up visit.  Since her last visit, she reports no major changes in her health she continues to have lower extremity pain predominantly in her feet.  The pain is described as throbbing constant pain that has not improved with last phlebotomy.  She denies any fevers or chills or sweats.  She denies any abdominal pain or constitutional symptoms.  She continues to have issues with migraine headaches.  She is not taking any oral iron supplements at this time.     Medications: Updated on review. Current Outpatient Medications  Medication Sig Dispense Refill   ferrous sulfate 325 (65 FE) MG EC tablet Take 1 tablet (325 mg total) by mouth daily. 90 tablet 3   pantoprazole (PROTONIX) 40 MG tablet Take 1 tablet (40 mg total) by mouth 2 (two) times daily. 30 tablet 1   sucralfate (CARAFATE) 1 g tablet Take 1 tablet (1 g total) by mouth 4 (four) times daily -  with meals and at bedtime. 90 tablet 2   No current facility-administered medications for this visit.   Facility-Administered Medications Ordered in Other Visits  Medication Dose Route Frequency Provider Last Rate Last Admin   gadopentetate dimeglumine (MAGNEVIST) injection 18 mL  18 mL Intravenous Once PRN Melvenia Beam, MD         Allergies: No Known Allergies    Physical Exam:      Blood pressure (!) 139/93, pulse 88, temperature 98.3 F (36.8 C), temperature source Oral, resp. rate 19, weight 223 lb 11.2 oz (101.5 kg), SpO2 100 %.     ECOG: 0   General  appearance: Comfortable appearing without any discomfort Head: Normocephalic without any trauma Oropharynx: Mucous membranes are moist and pink without any thrush or ulcers. Eyes: Pupils are equal and round reactive to light. Lymph nodes: No cervical, supraclavicular, inguinal or axillary lymphadenopathy.   Heart:regular rate and rhythm.  S1 and S2 without leg edema. Lung: Clear without any rhonchi or wheezes.  No dullness to percussion. Abdomin: Soft, nontender, nondistended with good bowel sounds.  No hepatosplenomegaly. Musculoskeletal: No joint deformity or effusion.  Full range of motion noted. Neurological: No deficits noted on motor, sensory and deep tendon reflex exam. Skin: No petechial rash or dryness.  Appeared moist.            Lab Results: Lab Results  Component Value Date   WBC 10.0 11/17/2020   HGB 13.1 11/17/2020   HCT 46.2 (H) 11/17/2020   MCV 68.5 (L) 11/17/2020   PLT 720 (H) 11/17/2020     Chemistry      Component Value Date/Time   NA 137 07/11/2020 0518   NA 140 02/20/2016 1432   K 3.6 07/11/2020 0518   K 4.1 02/20/2016 1432   CL 108 07/11/2020 0518   CO2 19 (L) 07/11/2020 0518   CO2 25 02/20/2016 1432   BUN 13 07/11/2020 0518   BUN 11.1 02/20/2016 1432   CREATININE 0.73 07/11/2020 0518   CREATININE 1.0 02/20/2016 1432      Component Value Date/Time   CALCIUM 8.8 (L) 07/11/2020  0518   CALCIUM 9.8 02/20/2016 1432   ALKPHOS 71 07/11/2020 0518   ALKPHOS 115 02/20/2016 1432   AST 10 (L) 07/11/2020 0518   AST 13 02/20/2016 1432   ALT 9 07/11/2020 0518   ALT 13 02/20/2016 1432   BILITOT 0.9 07/11/2020 0518   BILITOT 0.71 02/20/2016 1432      Impression and Plan:  50 year old woman with:  1.  JAK2 positive polycythemia vera diagnosed in 2017.    She is currently receiving therapeutic phlebotomy to keep her hematocrit below 45 without any major complaints.  Risks and benefits of continuing this approach were discussed at this time.   Complications related to this approach including worsening iron deficiency anemia as well as hemodynamic concerns were reviewed.  Hemoglobin reviewed today and currently adequate and does not require phlebotomy.   2. Thrombosis prophylaxis: She is low risk given her age and overall controlled counts.  3. Cytoreductive therapy: Her white cell count remains within normal range with platelets mildly elevated related to iron deficiency as well.  Hydroxyurea option has been deferred for the time being.  4.  Iron deficiency: She is off all oral iron at this time.  We will defer the option of IV iron for the time being.  5.  Lower extremity pain: Appears to be vascular in nature with element of neuropathic component.  We will try Neurontin for the next 2 months and attempt to alleviate some of that symptoms.  She will start 100 mg at nighttime and titrate accordingly.  6. Follow-up: She will continue monthly phlebotomy and will have MD follow-up in 4 months.  30  minutes were spent on this visit.  The time was dedicated to reviewing laboratory data, disease status update, treatment choices and complications related to therapy.   Zola Button, MD 7/12/20229:49 AM

## 2021-01-23 ENCOUNTER — Inpatient Hospital Stay: Payer: No Typology Code available for payment source

## 2021-01-23 ENCOUNTER — Inpatient Hospital Stay: Payer: No Typology Code available for payment source | Attending: Oncology

## 2021-01-23 ENCOUNTER — Other Ambulatory Visit: Payer: Self-pay

## 2021-01-23 VITALS — BP 123/90 | HR 91 | Temp 98.2°F | Resp 17

## 2021-01-23 DIAGNOSIS — D45 Polycythemia vera: Secondary | ICD-10-CM | POA: Insufficient documentation

## 2021-01-23 DIAGNOSIS — D500 Iron deficiency anemia secondary to blood loss (chronic): Secondary | ICD-10-CM

## 2021-01-23 LAB — CBC WITH DIFFERENTIAL (CANCER CENTER ONLY)
Abs Immature Granulocytes: 0.05 10*3/uL (ref 0.00–0.07)
Basophils Absolute: 0.3 10*3/uL — ABNORMAL HIGH (ref 0.0–0.1)
Basophils Relative: 3 %
Eosinophils Absolute: 0.5 10*3/uL (ref 0.0–0.5)
Eosinophils Relative: 5 %
HCT: 46.8 % — ABNORMAL HIGH (ref 36.0–46.0)
Hemoglobin: 13.2 g/dL (ref 12.0–15.0)
Immature Granulocytes: 1 %
Lymphocytes Relative: 21 %
Lymphs Abs: 2.1 10*3/uL (ref 0.7–4.0)
MCH: 19.5 pg — ABNORMAL LOW (ref 26.0–34.0)
MCHC: 28.2 g/dL — ABNORMAL LOW (ref 30.0–36.0)
MCV: 69.1 fL — ABNORMAL LOW (ref 80.0–100.0)
Monocytes Absolute: 0.5 10*3/uL (ref 0.1–1.0)
Monocytes Relative: 5 %
Neutro Abs: 6.6 10*3/uL (ref 1.7–7.7)
Neutrophils Relative %: 65 %
Platelet Count: 648 10*3/uL — ABNORMAL HIGH (ref 150–400)
RBC: 6.77 MIL/uL — ABNORMAL HIGH (ref 3.87–5.11)
RDW: 23.9 % — ABNORMAL HIGH (ref 11.5–15.5)
WBC Count: 10 10*3/uL (ref 4.0–10.5)
nRBC: 0 % (ref 0.0–0.2)

## 2021-01-23 LAB — FERRITIN: Ferritin: 8 ng/mL — ABNORMAL LOW (ref 11–307)

## 2021-01-23 LAB — IRON AND TIBC
Iron: 26 ug/dL — ABNORMAL LOW (ref 41–142)
Saturation Ratios: 8 % — ABNORMAL LOW (ref 21–57)
TIBC: 340 ug/dL (ref 236–444)
UIBC: 314 ug/dL (ref 120–384)

## 2021-01-23 NOTE — Patient Instructions (Signed)

## 2021-01-23 NOTE — Progress Notes (Signed)
Tolerated phlebotomy with no problems. See flow sheet.

## 2021-02-27 ENCOUNTER — Inpatient Hospital Stay: Payer: No Typology Code available for payment source | Attending: Oncology

## 2021-02-27 ENCOUNTER — Inpatient Hospital Stay: Payer: No Typology Code available for payment source

## 2021-02-27 ENCOUNTER — Other Ambulatory Visit: Payer: Self-pay

## 2021-02-27 DIAGNOSIS — D500 Iron deficiency anemia secondary to blood loss (chronic): Secondary | ICD-10-CM

## 2021-02-27 DIAGNOSIS — D45 Polycythemia vera: Secondary | ICD-10-CM | POA: Insufficient documentation

## 2021-02-27 LAB — CBC WITH DIFFERENTIAL (CANCER CENTER ONLY)
Abs Immature Granulocytes: 0.04 10*3/uL (ref 0.00–0.07)
Basophils Absolute: 0.3 10*3/uL — ABNORMAL HIGH (ref 0.0–0.1)
Basophils Relative: 3 %
Eosinophils Absolute: 0.5 10*3/uL (ref 0.0–0.5)
Eosinophils Relative: 5 %
HCT: 44.6 % (ref 36.0–46.0)
Hemoglobin: 12.6 g/dL (ref 12.0–15.0)
Immature Granulocytes: 0 %
Lymphocytes Relative: 19 %
Lymphs Abs: 2 10*3/uL (ref 0.7–4.0)
MCH: 19.6 pg — ABNORMAL LOW (ref 26.0–34.0)
MCHC: 28.3 g/dL — ABNORMAL LOW (ref 30.0–36.0)
MCV: 69.3 fL — ABNORMAL LOW (ref 80.0–100.0)
Monocytes Absolute: 0.5 10*3/uL (ref 0.1–1.0)
Monocytes Relative: 5 %
Neutro Abs: 6.8 10*3/uL (ref 1.7–7.7)
Neutrophils Relative %: 68 %
Platelet Count: 727 10*3/uL — ABNORMAL HIGH (ref 150–400)
RBC: 6.44 MIL/uL — ABNORMAL HIGH (ref 3.87–5.11)
RDW: 22.5 % — ABNORMAL HIGH (ref 11.5–15.5)
WBC Count: 10.1 10*3/uL (ref 4.0–10.5)
nRBC: 0 % (ref 0.0–0.2)

## 2021-02-27 LAB — IRON AND TIBC
Iron: 16 ug/dL — ABNORMAL LOW (ref 41–142)
Saturation Ratios: 4 % — ABNORMAL LOW (ref 21–57)
TIBC: 366 ug/dL (ref 236–444)
UIBC: 350 ug/dL (ref 120–384)

## 2021-02-27 LAB — FERRITIN: Ferritin: 4 ng/mL — ABNORMAL LOW (ref 11–307)

## 2021-02-27 NOTE — Progress Notes (Signed)
Per parameters set by Dr. [name], patient does not meet criteria for theraputic phlebotomy. This RN spoke with patient who reports she is currently feeling alright. Gave patient copy of today's lab results and copy of upcoming appointments. Patient discharged from facility.

## 2021-03-27 ENCOUNTER — Inpatient Hospital Stay: Payer: No Typology Code available for payment source | Attending: Oncology

## 2021-03-27 ENCOUNTER — Inpatient Hospital Stay: Payer: No Typology Code available for payment source

## 2021-03-27 ENCOUNTER — Other Ambulatory Visit: Payer: Self-pay

## 2021-03-27 VITALS — BP 136/78 | HR 91 | Temp 97.9°F | Resp 16

## 2021-03-27 DIAGNOSIS — D500 Iron deficiency anemia secondary to blood loss (chronic): Secondary | ICD-10-CM

## 2021-03-27 DIAGNOSIS — D45 Polycythemia vera: Secondary | ICD-10-CM | POA: Insufficient documentation

## 2021-03-27 LAB — CBC WITH DIFFERENTIAL (CANCER CENTER ONLY)
Abs Immature Granulocytes: 0.03 10*3/uL (ref 0.00–0.07)
Basophils Absolute: 0.2 10*3/uL — ABNORMAL HIGH (ref 0.0–0.1)
Basophils Relative: 3 %
Eosinophils Absolute: 0.4 10*3/uL (ref 0.0–0.5)
Eosinophils Relative: 5 %
HCT: 45.3 % (ref 36.0–46.0)
Hemoglobin: 12.9 g/dL (ref 12.0–15.0)
Immature Granulocytes: 0 %
Lymphocytes Relative: 20 %
Lymphs Abs: 1.9 10*3/uL (ref 0.7–4.0)
MCH: 19.6 pg — ABNORMAL LOW (ref 26.0–34.0)
MCHC: 28.5 g/dL — ABNORMAL LOW (ref 30.0–36.0)
MCV: 68.9 fL — ABNORMAL LOW (ref 80.0–100.0)
Monocytes Absolute: 0.4 10*3/uL (ref 0.1–1.0)
Monocytes Relative: 4 %
Neutro Abs: 6.2 10*3/uL (ref 1.7–7.7)
Neutrophils Relative %: 68 %
Platelet Count: 651 10*3/uL — ABNORMAL HIGH (ref 150–400)
RBC: 6.57 MIL/uL — ABNORMAL HIGH (ref 3.87–5.11)
RDW: 21.9 % — ABNORMAL HIGH (ref 11.5–15.5)
WBC Count: 9.1 10*3/uL (ref 4.0–10.5)
nRBC: 0 % (ref 0.0–0.2)

## 2021-03-27 LAB — IRON AND TIBC
Iron: 18 ug/dL — ABNORMAL LOW (ref 41–142)
Saturation Ratios: 5 % — ABNORMAL LOW (ref 21–57)
TIBC: 345 ug/dL (ref 236–444)
UIBC: 328 ug/dL (ref 120–384)

## 2021-03-27 LAB — FERRITIN: Ferritin: 4 ng/mL — ABNORMAL LOW (ref 11–307)

## 2021-03-27 NOTE — Patient Instructions (Signed)

## 2021-03-27 NOTE — Progress Notes (Signed)
Pt had phlebotomy for 600 grams including fluid in phlebotomy set.  Tol well.  Refreshments offered & instructed to drink plenty of fluids today.  D/c in no distress. IV site unremarkable.

## 2021-04-24 ENCOUNTER — Inpatient Hospital Stay (HOSPITAL_BASED_OUTPATIENT_CLINIC_OR_DEPARTMENT_OTHER): Payer: No Typology Code available for payment source | Admitting: Oncology

## 2021-04-24 ENCOUNTER — Other Ambulatory Visit: Payer: Self-pay

## 2021-04-24 ENCOUNTER — Inpatient Hospital Stay: Payer: No Typology Code available for payment source | Attending: Oncology

## 2021-04-24 ENCOUNTER — Inpatient Hospital Stay: Payer: No Typology Code available for payment source

## 2021-04-24 ENCOUNTER — Telehealth: Payer: Self-pay | Admitting: Pharmacy Technician

## 2021-04-24 ENCOUNTER — Telehealth: Payer: Self-pay

## 2021-04-24 ENCOUNTER — Other Ambulatory Visit (HOSPITAL_COMMUNITY): Payer: Self-pay

## 2021-04-24 VITALS — BP 134/85 | HR 92 | Temp 97.3°F | Resp 17 | Ht 63.0 in | Wt 227.0 lb

## 2021-04-24 VITALS — BP 109/68 | HR 81 | Temp 98.3°F | Resp 18

## 2021-04-24 DIAGNOSIS — D45 Polycythemia vera: Secondary | ICD-10-CM

## 2021-04-24 DIAGNOSIS — D500 Iron deficiency anemia secondary to blood loss (chronic): Secondary | ICD-10-CM

## 2021-04-24 LAB — IRON AND TIBC
Iron: 18 ug/dL — ABNORMAL LOW (ref 41–142)
Saturation Ratios: 5 % — ABNORMAL LOW (ref 21–57)
TIBC: 363 ug/dL (ref 236–444)
UIBC: 345 ug/dL (ref 120–384)

## 2021-04-24 LAB — CBC WITH DIFFERENTIAL (CANCER CENTER ONLY)
Abs Immature Granulocytes: 0.08 10*3/uL — ABNORMAL HIGH (ref 0.00–0.07)
Basophils Absolute: 0.2 10*3/uL — ABNORMAL HIGH (ref 0.0–0.1)
Basophils Relative: 3 %
Eosinophils Absolute: 0.4 10*3/uL (ref 0.0–0.5)
Eosinophils Relative: 5 %
HCT: 45.3 % (ref 36.0–46.0)
Hemoglobin: 12.6 g/dL (ref 12.0–15.0)
Immature Granulocytes: 1 %
Lymphocytes Relative: 17 %
Lymphs Abs: 1.7 10*3/uL (ref 0.7–4.0)
MCH: 18.9 pg — ABNORMAL LOW (ref 26.0–34.0)
MCHC: 27.8 g/dL — ABNORMAL LOW (ref 30.0–36.0)
MCV: 67.9 fL — ABNORMAL LOW (ref 80.0–100.0)
Monocytes Absolute: 0.4 10*3/uL (ref 0.1–1.0)
Monocytes Relative: 4 %
Neutro Abs: 6.8 10*3/uL (ref 1.7–7.7)
Neutrophils Relative %: 70 %
Platelet Count: 629 10*3/uL — ABNORMAL HIGH (ref 150–400)
RBC: 6.67 MIL/uL — ABNORMAL HIGH (ref 3.87–5.11)
RDW: 21.2 % — ABNORMAL HIGH (ref 11.5–15.5)
WBC Count: 9.7 10*3/uL (ref 4.0–10.5)
nRBC: 0 % (ref 0.0–0.2)

## 2021-04-24 LAB — FERRITIN: Ferritin: <4 ng/mL — ABNORMAL LOW (ref 11–307)

## 2021-04-24 MED ORDER — RUXOLITINIB PHOSPHATE 10 MG PO TABS
10.0000 mg | ORAL_TABLET | Freq: Two times a day (BID) | ORAL | 1 refills | Status: DC
  Filled 2021-04-24: qty 60, 30d supply, fill #0

## 2021-04-24 NOTE — Telephone Encounter (Signed)
Oral Oncology Pharmacist Encounter  Received new prescription for jakafi (ruxolitinib) for the treatment of polycythemia vera with a JAK2 positive mutation, planned duration until disease progression or unacceptable toxicity.  Labs from 04/24/21 (CBC) and CMP from 07/11/20 assessed, no interventions needed.  Current medication list in Epic reviewed, DDIs with jakafi identified: none  Evaluated chart and no patient barriers to medication adherence noted.   Patient agreement for treatment documented in MD note on 04/24/2021.  Prescription has been e-scribed to the [facility] for benefits analysis and approval.  Oral Oncology Clinic will continue to follow for insurance authorization, copayment issues, initial counseling and start date.  [name], PharmD Hematology/Oncology Clinical Pharmacist [facility] [phone] 04/24/2021 10:07 AM

## 2021-04-24 NOTE — Telephone Encounter (Signed)
Oral Oncology Patient Advocate Encounter   Received notification from [employer] that prior authorization for [name] is required.   PA submitted on CoverMyMeds Key [id number] Status is pending    Oral Oncology Clinic will continue to follow.  [place] Patient [place] Phone [phone] Fax [phone] 04/24/2021 10:40 AM

## 2021-04-24 NOTE — Patient Instructions (Signed)

## 2021-04-24 NOTE — Progress Notes (Signed)
Meredith Mody Martus presents today for phlebotomy per MD orders. Phlebotomy procedure started at 0930 and ended at 0939. 500 grams removed. Patient observed for 30 minutes after procedure without any incident. Patient tolerated procedure well. IV needle removed intact.  1010  Pt left the unit ambulatory without any complaints.  No parameters noted in phlebotomy orders.  Per MD proceed with therapeutic phlebotomy.

## 2021-04-24 NOTE — Progress Notes (Signed)
Hematology and Oncology Follow Up Visit  Eileen [name] [id number] [birth year] 50 y.o. 04/24/2021 8:00 AM Eileen [name] Eileen [name], MDRichter, Eileen Munroe, MD   Principle Diagnosis: 50 year old woman with polycythemia vera diagnosed in 2017.  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  Current therapy:   Therapeutic phlebotomy every 4 weeks to keep her hematocrit less than 45.    Interim History: [name] returns today for repeat evaluation.  Since her last visit, she reports a few complaints at this time related to her condition.  She has reported symptoms of lower extremity pain and pruritus.  The phlebotomy usually helps temporarily.  She does report some fatigue and tiredness.  She denies any nausea or vomiting or lightheadedness.     Medications: Reviewed without changes. Current Outpatient Medications  Medication Sig Dispense Refill   ferrous sulfate 325 (65 FE) MG EC tablet Take 1 tablet (325 mg total) by mouth daily. 90 tablet 3   gabapentin (NEURONTIN) 100 MG capsule Take 1 capsule (100 mg total) by mouth at bedtime. 90 capsule 3   pantoprazole (PROTONIX) 40 MG tablet Take 1 tablet (40 mg total) by mouth 2 (two) times daily. 30 tablet 1   sucralfate (CARAFATE) 1 g tablet Take 1 tablet (1 g total) by mouth 4 (four) times daily -  with meals and at bedtime. 90 tablet 2   No current facility-administered medications for this visit.   Facility-Administered Medications Ordered in Other Visits  Medication Dose Route Frequency Provider Last Rate Last Admin   gadopentetate dimeglumine (MAGNEVIST) injection 18 mL  18 mL Intravenous Once PRN Melvenia Beam, MD         Allergies: No Known Allergies    Physical Exam:       Blood pressure 134/85, pulse 92, temperature (!) 97.3 F (36.3 C), temperature source Tympanic, resp. rate 17, height 5\' 3"  (1.6 m), weight 227 lb (103 kg), SpO2 100 %.     ECOG:  0    General appearance: Alert, awake without any distress. Head: Atraumatic without abnormalities Oropharynx: Without any thrush or ulcers. Eyes: No scleral icterus. Lymph nodes: No lymphadenopathy noted in the cervical, supraclavicular, or axillary nodes Heart:regular rate and rhythm, without any murmurs or gallops.   Lung: Clear to auscultation without any rhonchi, wheezes or dullness to percussion. Abdomin: Soft, nontender without any shifting dullness or ascites. Musculoskeletal: No clubbing or cyanosis. Neurological: No motor or sensory deficits. Skin: No rashes or lesions.            Lab Results: Lab Results  Component Value Date   WBC 9.1 03/27/2021   HGB 12.9 03/27/2021   HCT 45.3 03/27/2021   MCV 68.9 (L) 03/27/2021   PLT 651 (H) 03/27/2021     Chemistry      Component Value Date/Time   NA 137 07/11/2020 0518   NA 140 02/20/2016 1432   K 3.6 07/11/2020 0518   K 4.1 02/20/2016 1432   CL 108 07/11/2020 0518   CO2 19 (L) 07/11/2020 0518   CO2 25 02/20/2016 1432   BUN 13 07/11/2020 0518   BUN 11.1 02/20/2016 1432   CREATININE 0.73 07/11/2020 0518   CREATININE 1.0 02/20/2016 1432      Component Value Date/Time   CALCIUM 8.8 (L) 07/11/2020 0518   CALCIUM 9.8 02/20/2016 1432   ALKPHOS 71 07/11/2020 0518   ALKPHOS 115 02/20/2016 1432   AST 10 (L) 07/11/2020 0518  AST 13 02/20/2016 1432   ALT 9 07/11/2020 0518   ALT 13 02/20/2016 1432   BILITOT 0.9 07/11/2020 0518   BILITOT 0.71 02/20/2016 1432      Impression and Plan:  50 year old woman with:  1.  Myeloproliferative disorder presented with JAK2 positive mutation in 2017.  The natural course of her disease was reviewed at this time and treatment choices were reiterated.  Therapeutic phlebotomy versus hydroxyurea were discussed.  Complications associated with hydroxyurea versus Jakafi were discussed.  She has experienced more constitutional symptoms and Jakafi well offering better symptomatic  relief as well as better control of her counts.  Risks and benefits of this medication specifically were discussed.  Myelosuppression infection and complications were reiterated and she is agreeable to proceed and the plan is to start with 10 mg twice a day.   2. Thrombosis prophylaxis: Risk of thrombosis remains low at this time.  3. Cytoreductive therapy: Her white cell count remains close to normal range with elevation in her platelets is likely related to iron deficiency.   4.  Iron deficiency: Related to phlebotomy without any iron replacement at this time.  5.  Lower extremity pain: Related to polycythemia with symptoms improved with phlebotomy.  6. Follow-up: Monthly for phlebotomy and MD follow-up in 4 months.  30  minutes were spent on this encounter.  Time was dedicated to reviewing laboratory data, disease status update and outlining future plan of care.  Zola Button, MD 11/15/20228:00 AM

## 2021-04-24 NOTE — Telephone Encounter (Signed)
Oral Oncology Patient Advocate Encounter  Received notification from [employer] that the request for prior authorization for [name] has been denied due to the patient not having failed or unable to use Hydroxyurea or interferon therapy.   This encounter will continue to be updated until final determination.    [place] Patient [place] Phone [phone] Fax [phone] 04/24/2021 12:37 PM

## 2021-05-07 NOTE — Telephone Encounter (Signed)
Called to check status of appeal, was transferred 8 times between [facility] and [employer], with no success. 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 She was not able to see where they received and provided a new fax number. Faxed off appeal.  Fax# [phone]

## 2021-05-08 NOTE — Telephone Encounter (Signed)
Left message for patient to see if she can see if appeal was received on her end.

## 2021-05-09 NOTE — Telephone Encounter (Signed)
Left message for patient to see if she can see if appeal was received on her end.

## 2021-05-10 ENCOUNTER — Encounter: Payer: Self-pay | Admitting: Oncology

## 2021-05-10 ENCOUNTER — Other Ambulatory Visit (HOSPITAL_COMMUNITY): Payer: Self-pay

## 2021-05-11 ENCOUNTER — Other Ambulatory Visit: Payer: Self-pay | Admitting: Oncology

## 2021-05-11 ENCOUNTER — Other Ambulatory Visit (HOSPITAL_COMMUNITY): Payer: Self-pay

## 2021-05-11 MED ORDER — HYDROXYUREA 500 MG PO CAPS
500.0000 mg | ORAL_CAPSULE | Freq: Every day | ORAL | 1 refills | Status: DC
  Filled 2021-05-11: qty 30, 30d supply, fill #0

## 2021-05-14 ENCOUNTER — Other Ambulatory Visit (HOSPITAL_COMMUNITY): Payer: Self-pay

## 2021-05-14 NOTE — Telephone Encounter (Signed)
Oral Oncology Pharmacist Encounter  Received new prescription for Hydrea (hydroxyurea) for the treatment of polycythemia vera, planned duration until disease progression or unacceptable toxicity.   Due to insurance denial, patient will need to try the hydrea prior to the approval for [facility].  Labs from 04/24/21 assessed, no adjustments needed.  Current medication list in Epic reviewed, DDIs with hydrea identified: none  Evaluated chart and no patient barriers to medication adherence noted.   Patient agreement for treatment documented in MD note on 04/24/2021.  Prescription has been e-scribed to the [facility] for benefits analysis and approval.  Oral Oncology Clinic will continue to follow for insurance authorization, copayment issues, initial counseling and start date.  [name], PharmD Hematology/Oncology Clinical Pharmacist [facility] [phone] 05/14/2021 11:54 AM

## 2021-05-16 NOTE — Telephone Encounter (Signed)
Oral Chemotherapy Pharmacist Encounter  I spoke with patient for overview of: Hydrea (hydroxyurea) for the  treatment of polycythemia vera, planned duration until disease progression or unacceptable drug toxicity.  Counseled patient on administration, dosing, side effects, monitoring, drug-food interactions, safe handling, storage, and disposal.  Patient will take Hydrea 500mg : 1 tablet (500mg ) by mouth daily. To reduce GI side effects, patient instructed to take with food.   Hydrea start date: 05/17/2021 Patient stated she will pick up the medication from [facility] today.  Adverse effects include but are not limited to: fatigue, decreased blood counts, and GI upset.  Reviewed with patient importance of keeping a medication schedule and plan for any missed doses. No barriers to medication adherence identified.  Medication reconciliation performed and medication/allergy list updated.  Patient informed the pharmacy will reach out 5-7 days prior to needing next fill of Xeloda to coordinate continued medication acquisition to prevent break in therapy.  All questions answered.  Mrs. Yaden voiced understanding and appreciation.   Medication education handout placed in mail for patient. Patient knows to call the office with questions or concerns. Oral Chemotherapy Clinic phone number provided to patient.   [name], PharmD Hematology/Oncology Clinical Pharmacist [facility] [phone]

## 2021-05-22 ENCOUNTER — Telehealth: Payer: Self-pay

## 2021-05-22 ENCOUNTER — Other Ambulatory Visit (HOSPITAL_COMMUNITY): Payer: Self-pay

## 2021-05-22 DIAGNOSIS — D45 Polycythemia vera: Secondary | ICD-10-CM

## 2021-05-22 MED ORDER — RUXOLITINIB PHOSPHATE 10 MG PO TABS: 10.0000 mg | ORAL_TABLET | Freq: Two times a day (BID) | ORAL | 1 refills | Status: DC

## 2021-05-22 NOTE — Telephone Encounter (Signed)
Received notification from [facility] regarding a prior authorization for  [facility] . Authorization has been APPROVED from 05/22/21 to 11/20/21.   Patient must fill through [place]: [phone]   Authorization # [id number]

## 2021-05-22 NOTE — Telephone Encounter (Signed)
Patient did not tolerate Hydrea.  Submitted a Prior Authorization request to [facility] for  Jakafi  via CoverMyMeds. Will update once we receive a response.   (KeyDorena Bodo) - [id number]

## 2021-05-22 NOTE — Telephone Encounter (Signed)
[place] card:  BIN- [id number] Group- [id number]  Phone# [phone]

## 2021-05-24 NOTE — Telephone Encounter (Signed)
Oral Chemotherapy Pharmacist Encounter  I spoke with patient for overview of: Jakafi (ruxolitinib) for the treatment of polycythemia vera, planned duration until disease progression or unacceptable toxicity.   Counseled patient on administration, dosing, side effects, monitoring, drug-food interactions, safe handling, storage, and disposal.  Patient will take Jakafi 10mg  tablets, 1 tablet by mouth 2 times daily, with or without food.  Patient knows to avoid grapefruit and grapefruit juice.  Jakafi start date: 05/25/21  Adverse effects include but are not limited to: decreased blood counts, increased lipid profile, increased cholesterol, increased liver enzymes, dizziness, headache, diarrhea, increased risk of some infections (including bacterial, mycobacterial, fungal, or viral), and increased risk of non-melanoma skin cancers.    Reviewed with patient importance of keeping a medication schedule and plan for any missed doses. No barriers to medication adherence identified.  Medication reconciliation performed and medication/allergy list updated.  Insurance authorization for [name] has been obtained. This will ship from [facility] pharmacy mail service. Jakafi copay card was obtained.   Patient informed the pharmacy will reach out 5-7 days prior to needing next fill of jakafi to coordinate continued medication acquisition to prevent break in therapy.  All questions answered.  Eileen [name] voiced understanding and appreciation.   Medication education handout placed in mail for patient. Patient knows to call the office with questions or concerns. Oral Chemotherapy Clinic phone number provided to patient.   [name], PharmD Hematology/Oncology Clinical Pharmacist [facility] [phone] 05/24/2021   11:40 AM

## 2021-05-25 NOTE — Telephone Encounter (Signed)
Spoke to pharmacy, patient will receive her shipment by EOD on 12/16

## 2021-05-29 ENCOUNTER — Other Ambulatory Visit: Payer: Self-pay

## 2021-05-29 ENCOUNTER — Inpatient Hospital Stay: Payer: No Typology Code available for payment source

## 2021-05-29 ENCOUNTER — Inpatient Hospital Stay: Payer: No Typology Code available for payment source | Attending: Oncology

## 2021-05-29 DIAGNOSIS — D500 Iron deficiency anemia secondary to blood loss (chronic): Secondary | ICD-10-CM

## 2021-05-29 DIAGNOSIS — D45 Polycythemia vera: Secondary | ICD-10-CM | POA: Insufficient documentation

## 2021-05-29 LAB — CBC WITH DIFFERENTIAL (CANCER CENTER ONLY)
Abs Immature Granulocytes: 0.04 10*3/uL (ref 0.00–0.07)
Basophils Absolute: 0.3 10*3/uL — ABNORMAL HIGH (ref 0.0–0.1)
Basophils Relative: 3 %
Eosinophils Absolute: 0.5 10*3/uL (ref 0.0–0.5)
Eosinophils Relative: 5 %
HCT: 42.1 % (ref 36.0–46.0)
Hemoglobin: 11.6 g/dL — ABNORMAL LOW (ref 12.0–15.0)
Immature Granulocytes: 0 %
Lymphocytes Relative: 21 %
Lymphs Abs: 1.9 10*3/uL (ref 0.7–4.0)
MCH: 18.5 pg — ABNORMAL LOW (ref 26.0–34.0)
MCHC: 27.6 g/dL — ABNORMAL LOW (ref 30.0–36.0)
MCV: 67 fL — ABNORMAL LOW (ref 80.0–100.0)
Monocytes Absolute: 0.4 10*3/uL (ref 0.1–1.0)
Monocytes Relative: 4 %
Neutro Abs: 6 10*3/uL (ref 1.7–7.7)
Neutrophils Relative %: 67 %
Platelet Count: 567 10*3/uL — ABNORMAL HIGH (ref 150–400)
RBC: 6.28 MIL/uL — ABNORMAL HIGH (ref 3.87–5.11)
RDW: 21.8 % — ABNORMAL HIGH (ref 11.5–15.5)
WBC Count: 9 10*3/uL (ref 4.0–10.5)
nRBC: 0 % (ref 0.0–0.2)

## 2021-05-29 LAB — IRON AND IRON BINDING CAPACITY (CC-WL,HP ONLY)
Iron: 17 ug/dL — ABNORMAL LOW (ref 28–170)
Saturation Ratios: 4 % — ABNORMAL LOW (ref 10.4–31.8)
TIBC: 419 ug/dL (ref 250–450)
UIBC: 402 ug/dL (ref 148–442)

## 2021-05-29 LAB — FERRITIN: Ferritin: <4 ng/mL — ABNORMAL LOW (ref 11–307)

## 2021-05-29 NOTE — Progress Notes (Signed)
Hct 42.1 today, no phlebotomy needed per Dr [name]. Patient aware and agrees.

## 2021-06-19 ENCOUNTER — Telehealth: Payer: Self-pay | Admitting: Oncology

## 2021-06-19 NOTE — Telephone Encounter (Signed)
Sch per 1/9 inbasket, unable to leave msg, duplicate cancelled, Pt active on mychart

## 2021-06-26 ENCOUNTER — Other Ambulatory Visit: Payer: No Typology Code available for payment source

## 2021-06-29 ENCOUNTER — Inpatient Hospital Stay (HOSPITAL_BASED_OUTPATIENT_CLINIC_OR_DEPARTMENT_OTHER): Payer: No Typology Code available for payment source | Admitting: Oncology

## 2021-06-29 ENCOUNTER — Inpatient Hospital Stay: Payer: No Typology Code available for payment source

## 2021-06-29 ENCOUNTER — Other Ambulatory Visit: Payer: Self-pay

## 2021-06-29 ENCOUNTER — Inpatient Hospital Stay: Payer: No Typology Code available for payment source | Attending: Oncology

## 2021-06-29 VITALS — BP 149/93 | HR 80 | Temp 97.9°F | Resp 17 | Ht 63.0 in | Wt 227.2 lb

## 2021-06-29 DIAGNOSIS — D500 Iron deficiency anemia secondary to blood loss (chronic): Secondary | ICD-10-CM

## 2021-06-29 DIAGNOSIS — D45 Polycythemia vera: Secondary | ICD-10-CM | POA: Diagnosis present

## 2021-06-29 DIAGNOSIS — E611 Iron deficiency: Secondary | ICD-10-CM | POA: Insufficient documentation

## 2021-06-29 LAB — CBC WITH DIFFERENTIAL (CANCER CENTER ONLY)
Abs Immature Granulocytes: 0.03 10*3/uL (ref 0.00–0.07)
Basophils Absolute: 0.3 10*3/uL — ABNORMAL HIGH (ref 0.0–0.1)
Basophils Relative: 3 %
Eosinophils Absolute: 0.5 10*3/uL (ref 0.0–0.5)
Eosinophils Relative: 5 %
HCT: 43.2 % (ref 36.0–46.0)
Hemoglobin: 12 g/dL (ref 12.0–15.0)
Immature Granulocytes: 0 %
Lymphocytes Relative: 21 %
Lymphs Abs: 2 10*3/uL (ref 0.7–4.0)
MCH: 18.5 pg — ABNORMAL LOW (ref 26.0–34.0)
MCHC: 27.8 g/dL — ABNORMAL LOW (ref 30.0–36.0)
MCV: 66.7 fL — ABNORMAL LOW (ref 80.0–100.0)
Monocytes Absolute: 0.4 10*3/uL (ref 0.1–1.0)
Monocytes Relative: 5 %
Neutro Abs: 6.5 10*3/uL (ref 1.7–7.7)
Neutrophils Relative %: 66 %
Platelet Count: 690 10*3/uL — ABNORMAL HIGH (ref 150–400)
RBC: 6.48 MIL/uL — ABNORMAL HIGH (ref 3.87–5.11)
RDW: 22.9 % — ABNORMAL HIGH (ref 11.5–15.5)
WBC Count: 9.8 10*3/uL (ref 4.0–10.5)
nRBC: 0 % (ref 0.0–0.2)

## 2021-06-29 LAB — IRON AND IRON BINDING CAPACITY (CC-WL,HP ONLY)
Iron: 22 ug/dL — ABNORMAL LOW (ref 28–170)
Saturation Ratios: 5 % — ABNORMAL LOW (ref 10.4–31.8)
TIBC: 421 ug/dL (ref 250–450)
UIBC: 399 ug/dL (ref 148–442)

## 2021-06-29 LAB — FERRITIN: Ferritin: <4 ng/mL — ABNORMAL LOW (ref 11–307)

## 2021-06-29 NOTE — Progress Notes (Signed)
Hematology and Oncology Follow Up Visit  Eileen [name] [id number] 02-09-1971 51 y.o. 06/29/2021 8:57 AM Eileen [name] Eileen [name], MDRichter, Eileen Munroe, MD   Principle Diagnosis: 51 year old woman with JAK2 positive myeloproliferative disorder diagnosed in 2017.  She was found to have polycythemia vera   Secondary diagnosis: Iron deficiency related to recurrent phlebotomy.  Current therapy:   Therapeutic phlebotomy every 4 weeks to keep her hematocrit less than 45.   Jakafi 10 mg daily started in December 2022.   Interim History: [name] presents today for a follow-up visit.  Since the last visit, she has started [facility] after poor tolerance to hydroxyurea.  She denies any nausea, vomiting or abdominal pain related to it.  She had significant improvement in her vascular symptoms including decreased pain in her lower extremities.  She denies any headaches, blurry vision or recent hospitalizations.  Her performance status and quality of life remain excellent.  She does report some mild fatigue and tiredness.     Medications: Updated on review. Current Outpatient Medications  Medication Sig Dispense Refill   ferrous sulfate 325 (65 FE) MG EC tablet Take 1 tablet (325 mg total) by mouth daily. 90 tablet 3   gabapentin (NEURONTIN) 100 MG capsule Take 1 capsule (100 mg total) by mouth at bedtime. 90 capsule 3   hydroxyurea (HYDREA) 500 MG capsule Take 1 capsule (500 mg total) by mouth daily. May take with food to minimize GI side effects. 60 capsule 1   pantoprazole (PROTONIX) 40 MG tablet Take 1 tablet (40 mg total) by mouth 2 (two) times daily. 30 tablet 1   ruxolitinib phosphate (JAKAFI) 10 MG tablet Take 1 tablet (10 mg total) by mouth 2 (two) times daily. 60 tablet 1   sucralfate (CARAFATE) 1 g tablet Take 1 tablet (1 g total) by mouth 4 (four) times daily -  with meals and at bedtime. 90 tablet 2   No current facility-administered medications for this visit.   Facility-Administered  Medications Ordered in Other Visits  Medication Dose Route Frequency Provider Last Rate Last Admin   gadopentetate dimeglumine (MAGNEVIST) injection 18 mL  18 mL Intravenous Once PRN Eileen Beam, MD         Allergies: No Known Allergies    Physical Exam:            ECOG: 0    General appearance: Comfortable appearing without any discomfort Head: Normocephalic without any trauma Oropharynx: Mucous membranes are moist and pink without any thrush or ulcers. Eyes: Pupils are equal and round reactive to light. Lymph nodes: No cervical, supraclavicular, inguinal or axillary lymphadenopathy.   Heart:regular rate and rhythm.  S1 and S2 without leg edema. Lung: Clear without any rhonchi or wheezes.  No dullness to percussion. Abdomin: Soft, nontender, nondistended with good bowel sounds.  No hepatosplenomegaly. Musculoskeletal: No joint deformity or effusion.  Full range of motion noted. Neurological: No deficits noted on motor, sensory and deep tendon reflex exam. Skin: No petechial rash or dryness.  Appeared moist.             Lab Results: Lab Results  Component Value Date   WBC 9.8 06/29/2021   HGB 12.0 06/29/2021   HCT 43.2 06/29/2021   MCV 66.7 (L) 06/29/2021   PLT 690 (H) 06/29/2021     Chemistry      Component Value Date/Time   NA 137 07/11/2020 0518   NA 140 02/20/2016 1432   K 3.6 07/11/2020 0518   K 4.1 02/20/2016 1432  CL 108 07/11/2020 0518   CO2 19 (L) 07/11/2020 0518   CO2 25 02/20/2016 1432   BUN 13 07/11/2020 0518   BUN 11.1 02/20/2016 1432   CREATININE 0.73 07/11/2020 0518   CREATININE 1.0 02/20/2016 1432      Component Value Date/Time   CALCIUM 8.8 (L) 07/11/2020 0518   CALCIUM 9.8 02/20/2016 1432   ALKPHOS 71 07/11/2020 0518   ALKPHOS 115 02/20/2016 1432   AST 10 (L) 07/11/2020 0518   AST 13 02/20/2016 1432   ALT 9 07/11/2020 0518   ALT 13 02/20/2016 1432   BILITOT 0.9 07/11/2020 0518   BILITOT 0.71 02/20/2016 1432       Impression and Plan:  51 year old woman with:  1.  JAK2 positive polycythemia vera diagnosed in 2017.    She is currently on Jakafi 10 mg daily with excellent disease control and management of her symptoms.  Risks and benefits of continuing this treatment were discussed at this time.  CBC from today reviewed and showed adequate hematological parameters and does not require any phlebotomy.  She is agreeable to continue at this time given her excellent tolerance and benefit.  2. Thrombosis prophylaxis: Risk of thrombosis remains low given her young age and controlled counts.  3. Cytoreductive therapy: Her white cell count and platelet count remains adequate.  No additional cytoreductive therapy is needed.   4.  Iron deficiency: I recommended multivitamin supplements for the time being.  We will defer IV iron infusion.  5.  Lower extremity pain: Improved with Jakafi.  This is related to a vascular component to her polycythemia vera.  6. Follow-up: In 3 months for repeat follow-up.  30  minutes were dedicated to this visit.  The time was spent on reviewing laboratory data, disease status update and addressing complication related to her condition and therapy.  Eileen Button, MD 1/20/20238:57 AM

## 2021-07-09 ENCOUNTER — Other Ambulatory Visit: Payer: Self-pay | Admitting: Otolaryngology

## 2021-07-09 ENCOUNTER — Other Ambulatory Visit: Payer: Self-pay | Admitting: Oncology

## 2021-07-09 DIAGNOSIS — H903 Sensorineural hearing loss, bilateral: Secondary | ICD-10-CM

## 2021-07-09 DIAGNOSIS — D45 Polycythemia vera: Secondary | ICD-10-CM

## 2021-07-29 ENCOUNTER — Other Ambulatory Visit: Payer: Self-pay

## 2021-07-29 ENCOUNTER — Ambulatory Visit
Admission: RE | Admit: 2021-07-29 | Discharge: 2021-07-29 | Disposition: A | Discharge status: Home / Self Care | Payer: No Typology Code available for payment source | Source: Ambulatory Visit | Attending: Otolaryngology | Admitting: Otolaryngology

## 2021-07-29 DIAGNOSIS — H903 Sensorineural hearing loss, bilateral: Secondary | ICD-10-CM

## 2021-07-29 MED ORDER — GADOBENATE DIMEGLUMINE 529 MG/ML IV SOLN
20.0000 mL | Freq: Once | INTRAVENOUS | Status: AC | PRN
  Administered 2021-07-29: 20 mL via INTRAVENOUS

## 2021-08-07 ENCOUNTER — Other Ambulatory Visit (HOSPITAL_COMMUNITY): Payer: Self-pay

## 2021-09-08 ENCOUNTER — Other Ambulatory Visit: Payer: Self-pay | Admitting: Oncology

## 2021-09-08 DIAGNOSIS — D45 Polycythemia vera: Secondary | ICD-10-CM

## 2021-09-10 ENCOUNTER — Encounter: Payer: Self-pay | Admitting: Oncology

## 2021-09-19 ENCOUNTER — Telehealth: Payer: Self-pay | Admitting: Oncology

## 2021-09-19 NOTE — Telephone Encounter (Signed)
Called patient regarding upcoming appointments, patient has been nitrified.  ?

## 2021-09-27 ENCOUNTER — Telehealth: Payer: Self-pay | Admitting: Oncology

## 2021-09-27 NOTE — Telephone Encounter (Signed)
Called patient regarding upcoming appointments, left a voicemail. 

## 2021-09-28 ENCOUNTER — Inpatient Hospital Stay: Payer: No Typology Code available for payment source | Attending: Oncology

## 2021-09-28 ENCOUNTER — Inpatient Hospital Stay: Payer: No Typology Code available for payment source

## 2021-09-28 ENCOUNTER — Inpatient Hospital Stay (HOSPITAL_BASED_OUTPATIENT_CLINIC_OR_DEPARTMENT_OTHER): Payer: No Typology Code available for payment source | Admitting: Oncology

## 2021-09-28 ENCOUNTER — Other Ambulatory Visit: Payer: Self-pay

## 2021-09-28 VITALS — BP 114/87 | HR 87 | Temp 97.9°F | Resp 18 | Ht 63.0 in | Wt 225.1 lb

## 2021-09-28 DIAGNOSIS — D45 Polycythemia vera: Secondary | ICD-10-CM | POA: Insufficient documentation

## 2021-09-28 DIAGNOSIS — D500 Iron deficiency anemia secondary to blood loss (chronic): Secondary | ICD-10-CM

## 2021-09-28 DIAGNOSIS — Z79899 Other long term (current) drug therapy: Secondary | ICD-10-CM | POA: Insufficient documentation

## 2021-09-28 LAB — CBC WITH DIFFERENTIAL (CANCER CENTER ONLY)
Abs Immature Granulocytes: 0.03 10*3/uL (ref 0.00–0.07)
Basophils Absolute: 0.1 10*3/uL (ref 0.0–0.1)
Basophils Relative: 2 %
Eosinophils Absolute: 0.2 10*3/uL (ref 0.0–0.5)
Eosinophils Relative: 3 %
HCT: 45.5 % (ref 36.0–46.0)
Hemoglobin: 13.1 g/dL (ref 12.0–15.0)
Immature Granulocytes: 0 %
Lymphocytes Relative: 18 %
Lymphs Abs: 1.3 10*3/uL (ref 0.7–4.0)
MCH: 19.9 pg — ABNORMAL LOW (ref 26.0–34.0)
MCHC: 28.8 g/dL — ABNORMAL LOW (ref 30.0–36.0)
MCV: 69.1 fL — ABNORMAL LOW (ref 80.0–100.0)
Monocytes Absolute: 0.7 10*3/uL (ref 0.1–1.0)
Monocytes Relative: 10 %
Neutro Abs: 4.7 10*3/uL (ref 1.7–7.7)
Neutrophils Relative %: 67 %
Platelet Count: 488 10*3/uL — ABNORMAL HIGH (ref 150–400)
RBC: 6.58 MIL/uL — ABNORMAL HIGH (ref 3.87–5.11)
RDW: 22.6 % — ABNORMAL HIGH (ref 11.5–15.5)
WBC Count: 7.1 10*3/uL (ref 4.0–10.5)
nRBC: 0 % (ref 0.0–0.2)

## 2021-09-28 LAB — FERRITIN: Ferritin: 11 ng/mL (ref 11–307)

## 2021-09-28 NOTE — Progress Notes (Signed)
Hematology and Oncology Follow Up Visit ? ?Eileen [name] ?[id number] ?Jun 18, 1970 51 y.o. ?09/28/2021 8:53 AM ?[name], MDRichter, [name], MD  ? ?Principle Diagnosis: 51 year old woman with polycythemia vera diagnosed in 2017.  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 ? 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 ? ?Current therapy: ? ? ?Therapeutic phlebotomy every 4 weeks to keep her hematocrit less than 45.  ? ?Jakafi 10 mg daily started in December 2022. ? ? ?Interim History: [name] returns today for repeat evaluation.  Since last visit, she reports a feeling well without any major complaints.  She is currently on Jakafi which she has tolerated very well.  She reports no constitutional symptoms or vascular complaints.  Her lower extremity pain and neuropathy has resolved at this time.  She denies any recent hospitalizations or illnesses.  She denies any thrombosis or bleeding episodes.  Her performance status and quality of life remained excellent. ? ? ? ? ?Medications: Reviewed without changes. ?Current Outpatient Medications  ?Medication Sig Dispense Refill  ? ferrous sulfate 325 (65 FE) MG EC tablet Take 1 tablet (325 mg total) by mouth daily. 90 tablet 3  ? gabapentin (NEURONTIN) 100 MG capsule Take 1 capsule (100 mg total) by mouth at bedtime. 90 capsule 3  ? hydroxyurea (HYDREA) 500 MG capsule Take 1 capsule (500 mg total) by mouth daily. May take with food to minimize GI side effects. 60 capsule 1  ? JAKAFI 10 MG tablet TAKE 1 TABLET BY MOUTH TWICE  DAILY 60 tablet 1  ? pantoprazole (PROTONIX) 40 MG tablet Take 1 tablet (40 mg total) by mouth 2 (two) times daily. 30 tablet 1  ? sucralfate (CARAFATE) 1 g tablet Take 1 tablet (1 g total) by mouth 4 (four) times daily -  with meals and at bedtime. 90 tablet 2  ? ?No current facility-administered medications for this visit.  ? ?Facility-Administered Medications Ordered in Other Visits   ?Medication Dose Route Frequency Provider Last Rate Last Admin  ? gadopentetate dimeglumine (MAGNEVIST) injection 18 mL  18 mL Intravenous Once PRN Melvenia Beam, MD      ? ? ? ?Allergies: No Known Allergies ? ? ? ?Physical Exam: ? ?  ? ? ? ?Blood pressure 114/87, pulse 87, temperature 97.9 ?F (36.6 ?C), temperature source Temporal, resp. rate 18, height '5\' 3"'$  (1.6 m), weight 225 lb 1.6 oz (102.1 kg), SpO2 98 %. ? ? ? ? ? ?ECOG: 0 ? ? ? ?General appearance: Alert, awake without any distress. ?Head: Atraumatic without abnormalities ?Oropharynx: Without any thrush or ulcers. ?Eyes: No scleral icterus. ?Lymph nodes: No lymphadenopathy noted in the cervical, supraclavicular, or axillary nodes ?Heart:regular rate and rhythm, without any murmurs or gallops.   ?Lung: Clear to auscultation without any rhonchi, wheezes or dullness to percussion. ?Abdomin: Soft, nontender without any shifting dullness or ascites. ?Musculoskeletal: No clubbing or cyanosis. ?Neurological: No motor or sensory deficits. ?Skin: No rashes or lesions. ? ? ? ? ? ? ? ? ? ? ? ? ?Lab Results: ?Lab Results  ?Component Value Date  ? WBC 9.8 06/29/2021  ? HGB 12.0 06/29/2021  ? HCT 43.2 06/29/2021  ? MCV 66.7 (L) 06/29/2021  ? PLT 690 (H) 06/29/2021  ? ?  Chemistry   ?   ?Component Value Date/Time  ? NA 137 07/11/2020 0518  ? NA 140 02/20/2016 1432  ? K 3.6 07/11/2020 0518  ? K 4.1 02/20/2016 1432  ? CL 108 07/11/2020 0518  ?  CO2 19 (L) 07/11/2020 0518  ? CO2 25 02/20/2016 1432  ? BUN 13 07/11/2020 0518  ? BUN 11.1 02/20/2016 1432  ? CREATININE 0.73 07/11/2020 0518  ? CREATININE 1.0 02/20/2016 1432  ?    ?Component Value Date/Time  ? CALCIUM 8.8 (L) 07/11/2020 0518  ? CALCIUM 9.8 02/20/2016 1432  ? ALKPHOS 71 07/11/2020 0518  ? ALKPHOS 115 02/20/2016 1432  ? AST 10 (L) 07/11/2020 0518  ? AST 13 02/20/2016 1432  ? ALT 9 07/11/2020 0518  ? ALT 13 02/20/2016 1432  ? BILITOT 0.9 07/11/2020 0518  ? BILITOT 0.71 02/20/2016 1432  ?  ? ? ?Impression and  Plan: ? ?51 year old woman with: ? ?1.  Polycythemia vera diagnosed in 2017.  She presented with JAK2 positive mutation. ? ?The natural course of her disease was reviewed at this time and treatment choices were discussed.  She continues to be on Jakafi without any major complications.  Risks and benefits of continuing this treatment long-term were discussed.  The role for additional phlebotomy was also reiterated and at this time I see no indication currently. ? ?Laboratory data from today showed hemoglobin 13 and hematocrit of 45 without any additional need for phlebotomy. ? ?2. Thrombosis prophylaxis: Risk of thrombosis remains low given her young age and no previous history. ? ?3.  Iron deficiency: She continues to have severe iron deficiency which is causing thrombocytosis.  Iron studies are currently pending but her MCV slightly improved.  Her platelets are also has decreased.  I recommended multivitamin replacement with an elemental iron in it.  I will continue to monitor.  I will defer the option of IV iron to the future. ? ?4.  Lower extremity pain: Related to a vascular complaints from polycythemia vera.  At this time. ? ?5. Follow-up: She will return in 3 months for a follow-up visit. ? ?30  minutes were spent on this encounter.  The time was dedicated to reviewing laboratory data, disease status update and outlining future plan of care discussion. ? ?Zola Button, MD ?4/21/20238:53 AM  ?

## 2021-10-07 ENCOUNTER — Emergency Department: Payer: No Typology Code available for payment source

## 2021-10-07 ENCOUNTER — Encounter: Payer: Self-pay | Admitting: Emergency Medicine

## 2021-10-07 ENCOUNTER — Emergency Department
Admission: EM | Admit: 2021-10-07 | Discharge: 2021-10-08 | Disposition: A | Discharge status: Home / Self Care | Payer: No Typology Code available for payment source | Attending: Emergency Medicine | Admitting: Emergency Medicine

## 2021-10-07 DIAGNOSIS — M79604 Pain in right leg: Secondary | ICD-10-CM | POA: Insufficient documentation

## 2021-10-07 LAB — CBC WITH DIFFERENTIAL/PLATELET
Abs Immature Granulocytes: 0.04 10*3/uL (ref 0.00–0.07)
Basophils Absolute: 0.2 10*3/uL — ABNORMAL HIGH (ref 0.0–0.1)
Basophils Relative: 2 %
Eosinophils Absolute: 0.4 10*3/uL (ref 0.0–0.5)
Eosinophils Relative: 5 %
HCT: 46 % (ref 36.0–46.0)
Hemoglobin: 13 g/dL (ref 12.0–15.0)
Immature Granulocytes: 1 %
Lymphocytes Relative: 33 %
Lymphs Abs: 2.4 10*3/uL (ref 0.7–4.0)
MCH: 19.8 pg — ABNORMAL LOW (ref 26.0–34.0)
MCHC: 28.3 g/dL — ABNORMAL LOW (ref 30.0–36.0)
MCV: 69.9 fL — ABNORMAL LOW (ref 80.0–100.0)
Monocytes Absolute: 0.4 10*3/uL (ref 0.1–1.0)
Monocytes Relative: 6 %
Neutro Abs: 3.9 10*3/uL (ref 1.7–7.7)
Neutrophils Relative %: 53 %
Platelets: 603 10*3/uL — ABNORMAL HIGH (ref 150–400)
RBC: 6.58 MIL/uL — ABNORMAL HIGH (ref 3.87–5.11)
RDW: 22.8 % — ABNORMAL HIGH (ref 11.5–15.5)
WBC: 7.4 10*3/uL (ref 4.0–10.5)
nRBC: 0 % (ref 0.0–0.2)

## 2021-10-07 LAB — COMPREHENSIVE METABOLIC PANEL
ALT: 13 U/L (ref 0–44)
AST: 21 U/L (ref 15–41)
Albumin: 4.5 g/dL (ref 3.5–5.0)
Alkaline Phosphatase: 83 U/L (ref 38–126)
Anion gap: 6 (ref 5–15)
BUN: 15 mg/dL (ref 6–20)
CO2: 28 mmol/L (ref 22–32)
Calcium: 9.5 mg/dL (ref 8.9–10.3)
Chloride: 105 mmol/L (ref 98–111)
Creatinine, Ser: 0.92 mg/dL (ref 0.44–1.00)
GFR, Estimated: >60 mL/min (ref >60)
Glucose, Bld: 96 mg/dL (ref 70–99)
Potassium: 4.3 mmol/L (ref 3.5–5.1)
Sodium: 139 mmol/L (ref 135–145)
Total Bilirubin: 0.5 mg/dL (ref 0.3–1.2)
Total Protein: 7.9 g/dL (ref 6.5–8.1)

## 2021-10-07 MED ORDER — OXYCODONE HCL 5 MG PO TABS
5.0000 mg | ORAL_TABLET | Freq: Once | ORAL | Status: AC
  Administered 2021-10-07: 5 mg via ORAL
  Filled 2021-10-07: qty 1

## 2021-10-07 NOTE — ED Triage Notes (Signed)
Pt presents via POV with complaints of right leg swelling/pain that started 2 days ago. Hx of polycythemia vera and takes [place]. Pt ambulatory to triage - she notes taking OTC tylenol which has helped her minimally. Denies CP or SOB.  ?

## 2021-10-07 NOTE — ED Provider Notes (Signed)
? ?[facility] ?Provider Note ? ? ? Event Date/Time  ? First MD Initiated Contact with Patient 10/07/21 2033   ?  (approximate) ? ? ?History  ? ?Leg Pain ? ? ?HPI ? ?Eileen [name] is a 51 y.o. female with polycythemia vera who comes in with concerns for right leg pain.  Patient reports a week of right foot pain that is now radiating up into her calf.  She states that this feels different than when her polycythemia vera is high.  She denies any known falls.  Denies any chest pain, shortness of breath. ? ?On review of records patient was seen by oncology on 09/28/2021 for follow-up for her polycythemia. ? ? ?Physical Exam  ? ?Triage Vital Signs: ?ED Triage Vitals  ?Enc Vitals Group  ?   BP 10/07/21 2009 (!) 165/90  ?   Pulse Rate 10/07/21 2009 86  ?   Resp 10/07/21 2009 18  ?   Temp 10/07/21 2009 98.5 ?F (36.9 ?C)  ?   Temp Source 10/07/21 2009 Oral  ?   SpO2 10/07/21 2009 98 %  ?   Weight 10/07/21 2009 220 lb (99.8 kg)  ?   Height 10/07/21 2009 '5\' 3"'$  (1.6 m)  ?   Head Circumference --   ?   Peak Flow --   ?   Pain Score 10/07/21 2012 6  ?   Pain Loc --   ?   Pain Edu? --   ?   Excl. in [place]? --   ? ? ?Most recent vital signs: ?Vitals:  ? 10/07/21 2009  ?BP: (!) 165/90  ?Pulse: 86  ?Resp: 18  ?Temp: 98.5 ?F (36.9 ?C)  ?SpO2: 98%  ? ? ? ?General: Awake, no distress.  ?CV:  Good peripheral perfusion.  ?Resp:  Normal effort.  ?Abd:  No distention.  ?Other:  Right leg has good distal pulse.  Able to dorsiflex and plantarflex the ankle.  No obvious redness or joint swelling.  No significant swelling noted although she does report some right calf tenderness.  Some point tenderness underneath the toes. ? ? ?ED Results / Procedures / Treatments  ? ?Labs ?(all labs ordered are listed, but only abnormal results are displayed) ?Labs Reviewed  ?CBC WITH DIFFERENTIAL/PLATELET - Abnormal; Notable for the following components:  ?    Result Value  ? RBC 6.58 (*)   ? MCV 69.9 (*)   ? MCH 19.8 (*)   ? MCHC 28.3  (*)   ? RDW 22.8 (*)   ? Platelets 603 (*)   ? Basophils Absolute 0.2 (*)   ? All other components within normal limits  ?COMPREHENSIVE METABOLIC PANEL  ? ? ? ? ?RADIOLOGY ?I have reviewed the xray personally and no evidence of any fracture ? ?PROCEDURES: ? ?Critical Care performed: No ? ?Procedures ? ? ?MEDICATIONS ORDERED IN ED: ?Medications  ?oxyCODONE (Oxy IR/ROXICODONE) immediate release tablet 5 mg (5 mg Oral Given 10/07/21 2300)  ? ? ? ?IMPRESSION / MDM / ASSESSMENT AND PLAN / ED COURSE  ?I reviewed the triage vital signs and the nursing notes. ? ? ?Patient comes in with right leg pain.  Labs ordered evaluate for polycythemia, x-rays evaluate for any fractures, masses, ultrasound evaluate for any DVT.  Patient is got good distal pulse unlikely arterial issue.  Exam does not look consistent with gout or cellulitis.  She does have a little bit of soft tissue swelling which is nonspecific.  She denies any injury.  Offered her crutches she declined.  We will have her follow-up with podiatry outpatient. ? ?CBC, reassuring but does have RBCs greater than 6.58 similar to 9 days ago. ?CMP is stable ? ? ?Ultrasound is negative ? ?Reevaluated patient updated on results.  She expressed understand felt comfortable with discharge home and following up with PCP and alternate Tylenol ibuprofen for pain ? ? ?  ? ? ?FINAL CLINICAL IMPRESSION(S) / ED DIAGNOSES  ? ?Final diagnoses:  ?Right leg pain  ? ? ? ?Rx / DC Orders  ? ?ED Discharge Orders   ? ? None  ? ?  ? ? ? ?Note:  This document was prepared using Dragon voice recognition software and may include unintentional dictation errors. ?  ?Vanessa Arvin, MD ?10/07/21 2345 ? ?

## 2021-10-07 NOTE — Discharge Instructions (Addendum)
Your work-up was reassuring with negative ultrasound but you can return to the ER if develop worsening symptoms or any other concerns ?There was a little swelling noted on the x-ray of your foot but no fractures, and no blood clots.  You can take Tylenol 1 g every 8 hours and ibuprofen 600 every 6-8 hours with food to help with the pain and swelling and should follow-up with podiatry ?

## 2021-10-26 ENCOUNTER — Other Ambulatory Visit: Payer: Self-pay | Admitting: Oncology

## 2021-10-26 DIAGNOSIS — D45 Polycythemia vera: Secondary | ICD-10-CM

## 2021-11-13 ENCOUNTER — Telehealth: Payer: Self-pay | Admitting: Oncology

## 2021-11-13 NOTE — Telephone Encounter (Signed)
Called patient regarding rescheduled July appointment due to provider pal, patient has been called and notified.

## 2021-12-16 ENCOUNTER — Other Ambulatory Visit: Payer: Self-pay | Admitting: Oncology

## 2021-12-16 DIAGNOSIS — D45 Polycythemia vera: Secondary | ICD-10-CM

## 2021-12-17 ENCOUNTER — Encounter: Payer: Self-pay | Admitting: Oncology

## 2021-12-27 ENCOUNTER — Ambulatory Visit: Payer: No Typology Code available for payment source | Admitting: Oncology

## 2021-12-27 ENCOUNTER — Other Ambulatory Visit: Payer: No Typology Code available for payment source

## 2022-02-06 ENCOUNTER — Inpatient Hospital Stay (HOSPITAL_BASED_OUTPATIENT_CLINIC_OR_DEPARTMENT_OTHER): Payer: No Typology Code available for payment source | Admitting: Oncology

## 2022-02-06 ENCOUNTER — Inpatient Hospital Stay: Payer: No Typology Code available for payment source | Attending: Oncology

## 2022-02-06 ENCOUNTER — Other Ambulatory Visit: Payer: Self-pay

## 2022-02-06 VITALS — BP 137/74 | HR 100 | Temp 98.3°F | Resp 15 | Wt 228.8 lb

## 2022-02-06 DIAGNOSIS — D500 Iron deficiency anemia secondary to blood loss (chronic): Secondary | ICD-10-CM

## 2022-02-06 DIAGNOSIS — Z79899 Other long term (current) drug therapy: Secondary | ICD-10-CM | POA: Diagnosis not present

## 2022-02-06 DIAGNOSIS — E611 Iron deficiency: Secondary | ICD-10-CM | POA: Diagnosis not present

## 2022-02-06 DIAGNOSIS — D45 Polycythemia vera: Secondary | ICD-10-CM | POA: Insufficient documentation

## 2022-02-06 LAB — CBC WITH DIFFERENTIAL (CANCER CENTER ONLY)
Abs Immature Granulocytes: 0.05 10*3/uL (ref 0.00–0.07)
Basophils Absolute: 0.2 10*3/uL — ABNORMAL HIGH (ref 0.0–0.1)
Basophils Relative: 3 %
Eosinophils Absolute: 0.3 10*3/uL (ref 0.0–0.5)
Eosinophils Relative: 5 %
HCT: 40.7 % (ref 36.0–46.0)
Hemoglobin: 12 g/dL (ref 12.0–15.0)
Immature Granulocytes: 1 %
Lymphocytes Relative: 19 %
Lymphs Abs: 1.4 10*3/uL (ref 0.7–4.0)
MCH: 21.3 pg — ABNORMAL LOW (ref 26.0–34.0)
MCHC: 29.5 g/dL — ABNORMAL LOW (ref 30.0–36.0)
MCV: 72.3 fL — ABNORMAL LOW (ref 80.0–100.0)
Monocytes Absolute: 0.3 10*3/uL (ref 0.1–1.0)
Monocytes Relative: 4 %
Neutro Abs: 5 10*3/uL (ref 1.7–7.7)
Neutrophils Relative %: 68 %
Platelet Count: 298 10*3/uL (ref 150–400)
RBC: 5.63 MIL/uL — ABNORMAL HIGH (ref 3.87–5.11)
RDW: 23.2 % — ABNORMAL HIGH (ref 11.5–15.5)
WBC Count: 7.2 10*3/uL (ref 4.0–10.5)
nRBC: 0 % (ref 0.0–0.2)

## 2022-02-06 LAB — FERRITIN: Ferritin: 9 ng/mL — ABNORMAL LOW (ref 11–307)

## 2022-02-06 NOTE — Progress Notes (Signed)
Hematology and Oncology Follow Up Visit  TALASIA SAULTER [id number] Sep 26, 1970 51 y.o. 02/06/2022 2:26 PM [name], MDRichter, [name], MD   Principle Diagnosis: 51 year old woman with JAK2 positive polycythemia vera diagnosed in 2017.   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  Current therapy:   Therapeutic phlebotomy every 4 weeks to keep her hematocrit less than 45.   Jakafi 10 mg daily started in December 2022.   Interim History: Ms. Umholtz is here for a follow-up visit.  Since the last visit, she reports feeling well without any major complaints.  She continues to tolerate Jakafi without any new issues.  She denies any nausea vomiting or abdominal pain.  She denies any constitutional symptoms or early satiety.  Her lower extremity pain and vascular complaints has improved at this time.     Medications: Updated on review. Current Outpatient Medications  Medication Sig Dispense Refill   ferrous sulfate 325 (65 FE) MG EC tablet Take 1 tablet (325 mg total) by mouth daily. 90 tablet 3   gabapentin (NEURONTIN) 100 MG capsule Take 1 capsule (100 mg total) by mouth at bedtime. 90 capsule 3   hydroxyurea (HYDREA) 500 MG capsule Take 1 capsule (500 mg total) by mouth daily. May take with food to minimize GI side effects. 60 capsule 1   JAKAFI 10 MG tablet TAKE 1 TABLET BY MOUTH TWICE  DAILY 60 tablet 1   pantoprazole (PROTONIX) 40 MG tablet Take 1 tablet (40 mg total) by mouth 2 (two) times daily. 30 tablet 1   sucralfate (CARAFATE) 1 g tablet Take 1 tablet (1 g total) by mouth 4 (four) times daily -  with meals and at bedtime. 90 tablet 2   No current facility-administered medications for this visit.   Facility-Administered Medications Ordered in Other Visits  Medication Dose Route Frequency Provider Last Rate Last Admin   gadopentetate dimeglumine (MAGNEVIST) injection 18 mL  18 mL Intravenous Once PRN Melvenia Beam, MD          Allergies: No Known Allergies    Physical Exam:      Blood pressure 137/74, pulse 100, temperature 98.3 F (36.8 C), temperature source Oral, resp. rate 15, weight 228 lb 12.8 oz (103.8 kg), SpO2 97 %.        ECOG: 0   General appearance: Comfortable appearing without any discomfort Head: Normocephalic without any trauma Oropharynx: Mucous membranes are moist and pink without any thrush or ulcers. Eyes: Pupils are equal and round reactive to light. Lymph nodes: No cervical, supraclavicular, inguinal or axillary lymphadenopathy.   Heart:regular rate and rhythm.  S1 and S2 without leg edema. Lung: Clear without any rhonchi or wheezes.  No dullness to percussion. Abdomin: Soft, nontender, nondistended with good bowel sounds.  No hepatosplenomegaly. Musculoskeletal: No joint deformity or effusion.  Full range of motion noted. Neurological: No deficits noted on motor, sensory and deep tendon reflex exam. Skin: No petechial rash or dryness.  Appeared moist.              Lab Results: Lab Results  Component Value Date   WBC 7.4 10/07/2021   HGB 13.0 10/07/2021   HCT 46.0 10/07/2021   MCV 69.9 (L) 10/07/2021   PLT 603 (H) 10/07/2021     Chemistry      Component Value Date/Time   NA 139 10/07/2021 2039   NA 140 02/20/2016 1432   K 4.3 10/07/2021 2039   K 4.1 02/20/2016 1432   CL 105  10/07/2021 2039   CO2 28 10/07/2021 2039   CO2 25 02/20/2016 1432   BUN 15 10/07/2021 2039   BUN 11.1 02/20/2016 1432   CREATININE 0.92 10/07/2021 2039   CREATININE 1.0 02/20/2016 1432      Component Value Date/Time   CALCIUM 9.5 10/07/2021 2039   CALCIUM 9.8 02/20/2016 1432   ALKPHOS 83 10/07/2021 2039   ALKPHOS 115 02/20/2016 1432   AST 21 10/07/2021 2039   AST 13 02/20/2016 1432   ALT 13 10/07/2021 2039   ALT 13 02/20/2016 1432   BILITOT 0.5 10/07/2021 2039   BILITOT 0.71 02/20/2016 1432      Impression and Plan:  51 year old woman with:  1.   Polycythemia vera with JAK2 positive mutation diagnosed in 2017.  She presented with JAK2 positive mutation.  She continues to be on Jakafi with excellent response and control of her counts.  At this time I do not see for any additional phlebotomy unless her counts increase in the future.  Complications related to [facility] were discussed at this time these include nausea, fatigue and GI toxicity.   She had complete response to the current treatment with normalization of her counts as well as resolution of her constitutional symptoms.  She has no vascular complaints and I recommended continuing the same dose and schedule.  2. Thrombosis prophylaxis: Risk of thrombosis remains low without any bleeding issues.  3.  Iron deficiency: Her iron levels are improving with improvement in her MCV as well as ferritin.  Her platelet count is also improving with correction of her platelets.  4.  Lower extremity pain: Resolved at this time after Jakafi.  5. Follow-up: In 3 months for laboratory testing and MD follow-up in 6 months.  30  minutes were dedicated to this visit.  The time was spent on reviewing laboratory data, disease status update and future plan of care discussion.  Zola Button, MD 8/30/20232:26 PM

## 2022-02-26 ENCOUNTER — Telehealth: Payer: Self-pay | Admitting: *Deleted

## 2022-02-26 NOTE — Telephone Encounter (Signed)
PC to patient, informed her of Dr Hazeline Junker advice - be seen by PCP or go to Urgent Care.  Also instructed patient not to take more than 4000 mg of tylenol in 24 hours, she verbalizes understanding.

## 2022-02-26 NOTE — Telephone Encounter (Signed)
-----   Message from [name], MD sent at 02/26/2022  2:44 PM EDT ----- She needs to see her PCP or urgent care. This might be spleen but could also a viral infection and other issues.  ----- Message ----- From: Rolene Course, RN Sent: 02/26/2022   2:32 PM EDT To: [name], MD  This patient called, she is C/O LUQ pain that started about a month ago but has become progressively worse, it is constant & she can't sleep. It radiates into her back at times.  She is taking 4 tylenol every 2-3 hours which makes it a little more tolerable.  She has decreased appetite & nausea which is worse when she eats.  She also has diarrhea after eating.  She is concerned about her spleen.  Please advise.

## 2022-02-27 ENCOUNTER — Encounter (HOSPITAL_COMMUNITY): Payer: Self-pay

## 2022-02-27 ENCOUNTER — Encounter (HOSPITAL_BASED_OUTPATIENT_CLINIC_OR_DEPARTMENT_OTHER): Payer: Self-pay

## 2022-02-27 ENCOUNTER — Other Ambulatory Visit: Payer: Self-pay

## 2022-02-27 ENCOUNTER — Inpatient Hospital Stay (HOSPITAL_BASED_OUTPATIENT_CLINIC_OR_DEPARTMENT_OTHER)
Admission: EM | Admit: 2022-02-27 | Discharge: 2022-03-06 | DRG: 814 | Disposition: A | Discharge status: Home / Self Care | Payer: No Typology Code available for payment source | Attending: Family Medicine | Admitting: Family Medicine

## 2022-02-27 ENCOUNTER — Emergency Department (HOSPITAL_BASED_OUTPATIENT_CLINIC_OR_DEPARTMENT_OTHER): Payer: No Typology Code available for payment source

## 2022-02-27 DIAGNOSIS — I82890 Acute embolism and thrombosis of other specified veins: Secondary | ICD-10-CM | POA: Diagnosis present

## 2022-02-27 DIAGNOSIS — K219 Gastro-esophageal reflux disease without esophagitis: Secondary | ICD-10-CM | POA: Diagnosis present

## 2022-02-27 DIAGNOSIS — D45 Polycythemia vera: Secondary | ICD-10-CM | POA: Diagnosis present

## 2022-02-27 DIAGNOSIS — Z20822 Contact with and (suspected) exposure to covid-19: Secondary | ICD-10-CM | POA: Diagnosis present

## 2022-02-27 DIAGNOSIS — Z79899 Other long term (current) drug therapy: Secondary | ICD-10-CM

## 2022-02-27 DIAGNOSIS — E876 Hypokalemia: Secondary | ICD-10-CM | POA: Diagnosis present

## 2022-02-27 DIAGNOSIS — Z6841 Body Mass Index (BMI) 40.0 and over, adult: Secondary | ICD-10-CM | POA: Diagnosis not present

## 2022-02-27 DIAGNOSIS — R197 Diarrhea, unspecified: Secondary | ICD-10-CM | POA: Diagnosis present

## 2022-02-27 DIAGNOSIS — G629 Polyneuropathy, unspecified: Secondary | ICD-10-CM | POA: Diagnosis present

## 2022-02-27 DIAGNOSIS — I81 Portal vein thrombosis: Secondary | ICD-10-CM | POA: Diagnosis not present

## 2022-02-27 DIAGNOSIS — D509 Iron deficiency anemia, unspecified: Secondary | ICD-10-CM | POA: Diagnosis present

## 2022-02-27 DIAGNOSIS — Z7969 Long term (current) use of other immunomodulators and immunosuppressants: Secondary | ICD-10-CM | POA: Diagnosis not present

## 2022-02-27 DIAGNOSIS — R6510 Systemic inflammatory response syndrome (SIRS) of non-infectious origin without acute organ dysfunction: Secondary | ICD-10-CM | POA: Diagnosis present

## 2022-02-27 DIAGNOSIS — D735 Infarction of spleen: Principal | ICD-10-CM

## 2022-02-27 LAB — ACETAMINOPHEN LEVEL: Acetaminophen (Tylenol), Serum: <10 ug/mL — ABNORMAL LOW (ref 10–30)

## 2022-02-27 LAB — CBC
HCT: 37.8 % (ref 36.0–46.0)
Hemoglobin: 11.1 g/dL — ABNORMAL LOW (ref 12.0–15.0)
MCH: 21.4 pg — ABNORMAL LOW (ref 26.0–34.0)
MCHC: 29.4 g/dL — ABNORMAL LOW (ref 30.0–36.0)
MCV: 72.8 fL — ABNORMAL LOW (ref 80.0–100.0)
Platelets: 428 10*3/uL — ABNORMAL HIGH (ref 150–400)
RBC: 5.19 MIL/uL — ABNORMAL HIGH (ref 3.87–5.11)
RDW: 22.7 % — ABNORMAL HIGH (ref 11.5–15.5)
WBC: 9.7 10*3/uL (ref 4.0–10.5)
nRBC: 0 % (ref 0.0–0.2)

## 2022-02-27 LAB — COMPREHENSIVE METABOLIC PANEL
ALT: <5 U/L (ref 0–44)
AST: 10 U/L — ABNORMAL LOW (ref 15–41)
Albumin: 4 g/dL (ref 3.5–5.0)
Alkaline Phosphatase: 93 U/L (ref 38–126)
Anion gap: 12 (ref 5–15)
BUN: 12 mg/dL (ref 6–20)
CO2: 22 mmol/L (ref 22–32)
Calcium: 9.2 mg/dL (ref 8.9–10.3)
Chloride: 103 mmol/L (ref 98–111)
Creatinine, Ser: 0.78 mg/dL (ref 0.44–1.00)
GFR, Estimated: >60 mL/min (ref >60)
Glucose, Bld: 112 mg/dL — ABNORMAL HIGH (ref 70–99)
Potassium: 3.6 mmol/L (ref 3.5–5.1)
Sodium: 137 mmol/L (ref 135–145)
Total Bilirubin: 0.7 mg/dL (ref 0.3–1.2)
Total Protein: 7.6 g/dL (ref 6.5–8.1)

## 2022-02-27 LAB — URINALYSIS, ROUTINE W REFLEX MICROSCOPIC
Bilirubin Urine: NEGATIVE
Glucose, UA: NEGATIVE mg/dL
Hgb urine dipstick: NEGATIVE
Ketones, ur: 40 mg/dL — AB
Nitrite: NEGATIVE
Specific Gravity, Urine: 1.04 — ABNORMAL HIGH (ref 1.005–1.030)
pH: 6 (ref 5.0–8.0)

## 2022-02-27 LAB — TROPONIN I (HIGH SENSITIVITY): Troponin I (High Sensitivity): <2 ng/L (ref <18)

## 2022-02-27 LAB — D-DIMER, QUANTITATIVE: D-Dimer, Quant: 3.67 ug/mL-FEU — ABNORMAL HIGH (ref 0.00–0.50)

## 2022-02-27 LAB — LIPASE, BLOOD: Lipase: <10 U/L — ABNORMAL LOW (ref 11–51)

## 2022-02-27 LAB — ETHANOL: Alcohol, Ethyl (B): <10 mg/dL (ref <10)

## 2022-02-27 MED ORDER — ONDANSETRON HCL 4 MG/2ML IJ SOLN
4.0000 mg | Freq: Once | INTRAMUSCULAR | Status: AC
  Administered 2022-02-27: 4 mg via INTRAVENOUS
  Filled 2022-02-27: qty 2

## 2022-02-27 MED ORDER — IOHEXOL 350 MG/ML SOLN
100.0000 mL | Freq: Once | INTRAVENOUS | Status: AC | PRN
  Administered 2022-02-27: 80 mL via INTRAVENOUS

## 2022-02-27 MED ORDER — MELATONIN 5 MG PO TABS: 5.0000 mg | ORAL_TABLET | Freq: Every evening | ORAL | Status: DC | PRN

## 2022-02-27 MED ORDER — OXYCODONE HCL 5 MG PO TABS
5.0000 mg | ORAL_TABLET | Freq: Four times a day (QID) | ORAL | Status: DC | PRN
  Administered 2022-02-28: 5 mg via ORAL
  Filled 2022-02-27: qty 1

## 2022-02-27 MED ORDER — HEPARIN BOLUS VIA INFUSION
4500.0000 [IU] | Freq: Once | INTRAVENOUS | Status: AC
  Administered 2022-02-27: 4500 [IU] via INTRAVENOUS

## 2022-02-27 MED ORDER — SODIUM CHLORIDE 0.9 % IV BOLUS
1000.0000 mL | Freq: Once | INTRAVENOUS | Status: AC
  Administered 2022-02-27: 1000 mL via INTRAVENOUS

## 2022-02-27 MED ORDER — HEPARIN (PORCINE) 25000 UT/250ML-% IV SOLN
2900.0000 [IU]/h | INTRAVENOUS | Status: DC
  Administered 2022-02-27: 1400 [IU]/h via INTRAVENOUS
  Administered 2022-02-28: 1700 [IU]/h via INTRAVENOUS
  Administered 2022-02-28: 2150 [IU]/h via INTRAVENOUS
  Administered 2022-03-02 – 2022-03-05 (×8): 2900 [IU]/h via INTRAVENOUS
  Filled 2022-02-27 (×14): qty 250

## 2022-02-27 MED ORDER — ACETAMINOPHEN 10 MG/ML IV SOLN
1000.0000 mg | Freq: Once | INTRAVENOUS | Status: AC
  Administered 2022-02-27: 1000 mg via INTRAVENOUS
  Filled 2022-02-27: qty 100

## 2022-02-27 MED ORDER — POLYETHYLENE GLYCOL 3350 17 G PO PACK: 17.0000 g | PACK | Freq: Every day | ORAL | Status: DC | PRN

## 2022-02-27 MED ORDER — HYDROMORPHONE HCL 1 MG/ML IJ SOLN
0.5000 mg | INTRAMUSCULAR | Status: DC | PRN
  Administered 2022-02-27 – 2022-03-04 (×7): 0.5 mg via INTRAVENOUS
  Filled 2022-02-27 (×2): qty 0.5
  Filled 2022-02-27: qty 1
  Filled 2022-02-27 (×4): qty 0.5

## 2022-02-27 MED ORDER — LABETALOL HCL 5 MG/ML IV SOLN
10.0000 mg | INTRAVENOUS | Status: DC | PRN
  Administered 2022-02-27: 10 mg via INTRAVENOUS
  Filled 2022-02-27: qty 4

## 2022-02-27 MED ORDER — HYDROMORPHONE HCL 1 MG/ML IJ SOLN
1.0000 mg | Freq: Once | INTRAMUSCULAR | Status: AC
  Administered 2022-02-27: 1 mg via INTRAVENOUS
  Filled 2022-02-27: qty 1

## 2022-02-27 MED ORDER — GABAPENTIN 100 MG PO CAPS
100.0000 mg | ORAL_CAPSULE | Freq: Every day | ORAL | Status: DC
  Administered 2022-02-27 – 2022-03-05 (×7): 100 mg via ORAL
  Filled 2022-02-27 (×7): qty 1

## 2022-02-27 MED ORDER — ONDANSETRON HCL 4 MG/2ML IJ SOLN
4.0000 mg | Freq: Four times a day (QID) | INTRAMUSCULAR | Status: DC | PRN
  Administered 2022-02-27: 4 mg via INTRAVENOUS
  Filled 2022-02-27: qty 2

## 2022-02-27 NOTE — ED Provider Notes (Signed)
[facility] EMERGENCY DEPT Provider Note   CSN: [id number] Arrival date & time: 02/27/22  1517     History  Chief Complaint  Patient presents with   Abdominal Pain    Eileen [name] is a 51 y.o. female.  Patient with a history of ulcers presenting with left-sided upper abdominal pain for the past 1 month that has been hurting her constantly every day.  Worse with deep breathing and worse with palpation.  Associated with poor appetite and nausea but no vomiting.  Has had multiple episodes of diarrhea 4-5 times a day that been nonbloody and nonbilious but brown in color.  No fever.  No pain with urination or blood in the urine.  Is not had this pain previously.  Initially attributed this pain to her ulcers but this feels different.  Denies any black or bloody stools.  Denies any vomiting but has had nausea and poor appetite.  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  Patient states that she has been taking 2000 mg of Tylenol up to 4 times daily for the past several weeks for this pain.  Discussed with her that this is much too much Tylenol and limit should be 3 g/day  The history is provided by the patient.  Abdominal Pain Associated symptoms: diarrhea and nausea   Associated symptoms: no dysuria, no hematuria and no vomiting        Home Medications Prior to Admission medications   Medication Sig Start Date End Date Taking? Authorizing Provider  ferrous sulfate 325 (65 FE) MG EC tablet Take 1 tablet (325 mg total) by mouth daily. 08/11/20   [name], MD  gabapentin (NEURONTIN) 100 MG capsule Take 1 capsule (100 mg total) by mouth at bedtime. 12/19/20   [name], MD  hydroxyurea (HYDREA) 500 MG capsule Take 1 capsule (500 mg total) by mouth daily. May take with food to minimize GI side effects. 05/11/21   [name], MD  JAKAFI 10 MG tablet TAKE 1 TABLET BY MOUTH TWICE  DAILY 12/17/21   [name], MD  pantoprazole (PROTONIX) 40 MG tablet Take 1 tablet (40 mg total) by mouth 2 (two) times daily. 07/11/20 07/11/21  Georgette Shell, MD  sucralfate (CARAFATE) 1 g tablet Take 1 tablet (1 g total) by mouth 4 (four) times daily -  with meals and at bedtime. 07/11/20   Georgette Shell, MD      Allergies    Patient has no known allergies.    Review of Systems   Review of Systems  Gastrointestinal:  Positive for abdominal pain, diarrhea and nausea. Negative for vomiting.  Genitourinary:  Negative for dysuria and hematuria.  Musculoskeletal:  Negative for arthralgias and myalgias.  Skin:  Negative for rash.  Neurological:  Negative for weakness.   all other systems are negative except as noted in the HPI and PMH.    Physical Exam Updated Vital Signs BP (!) 156/88 (BP Location: Right Arm)   Pulse (!) 120   Temp 97.6 F (36.4 C)   Resp 18   Ht '5\' 3"'$  (1.6 m)   Wt 103.8 kg   SpO2 100%   BMI 40.54 kg/m  Physical Exam Vitals and nursing note reviewed.  Constitutional:      General: She is not in acute distress.    Appearance: She is well-developed.  HENT:     Head: Normocephalic and atraumatic.  Mouth/Throat:     Pharynx: No oropharyngeal exudate.  Eyes:     Conjunctiva/sclera: Conjunctivae normal.     Pupils: Pupils are equal, round, and reactive to light.  Neck:     Comments: No meningismus. Cardiovascular:     Rate and Rhythm: Regular rhythm. Tachycardia present.     Heart sounds: Normal heart sounds. No murmur heard.    Comments: Tachycardia 120 Pulmonary:     Effort: Pulmonary effort is normal. No respiratory distress.     Breath sounds: Normal breath sounds.  Chest:     Chest wall: No tenderness.  Abdominal:     Palpations: Abdomen is soft. There is mass.     Tenderness: There is abdominal tenderness. There is guarding. There is no rebound.     Comments: Tenderness left upper quadrant with suspected splenomegaly.  Musculoskeletal:        General: No  tenderness. Normal range of motion.     Cervical back: Normal range of motion and neck supple.  Skin:    General: Skin is warm.  Neurological:     Mental Status: She is alert and oriented to person, place, and time.     Cranial Nerves: No cranial nerve deficit.     Motor: No abnormal muscle tone.     Coordination: Coordination normal.     Comments:  5/5 strength throughout. CN 2-12 intact.Equal grip strength.   Psychiatric:        Behavior: Behavior normal.     ED Results / Procedures / Treatments   Labs (all labs ordered are listed, but only abnormal results are displayed) Labs Reviewed  LIPASE, BLOOD - Abnormal; Notable for the following components:      Result Value   Lipase <10 (*)    All other components within normal limits  COMPREHENSIVE METABOLIC PANEL - Abnormal; Notable for the following components:   Glucose, Bld 112 (*)    AST 10 (*)    All other components within normal limits  CBC - Abnormal; Notable for the following components:   RBC 5.19 (*)    Hemoglobin 11.1 (*)    MCV 72.8 (*)    MCH 21.4 (*)    MCHC 29.4 (*)    RDW 22.7 (*)    Platelets 428 (*)    All other components within normal limits  D-DIMER, QUANTITATIVE - Abnormal; Notable for the following components:   D-Dimer, Quant 3.67 (*)    All other components within normal limits  ACETAMINOPHEN LEVEL - Abnormal; Notable for the following components:   Acetaminophen (Tylenol), Serum <10 (*)    All other components within normal limits  ETHANOL  URINALYSIS, ROUTINE W REFLEX MICROSCOPIC  TROPONIN I (HIGH SENSITIVITY)  TROPONIN I (HIGH SENSITIVITY)    EKG EKG Interpretation  Date/Time:  Wednesday February 27 2022 15:57:38 EDT Ventricular Rate:  107 PR Interval:  181 QRS Duration: 104 QT Interval:  345 QTC Calculation: 461 R Axis:   37 Text Interpretation: Sinus tachycardia RSR' in V1 or V2, right VCD or RVH No significant change was found Confirmed by Ezequiel Essex [phone]) on 02/27/2022  4:01:42 PM  Radiology CT ABDOMEN PELVIS W CONTRAST  Result Date: 02/27/2022 CLINICAL DATA:  Acute abdominal pain. Left upper quadrant mass, spleen?. Radiologic records indicates history of polycythemia vera. EXAM: CT ABDOMEN AND PELVIS WITH CONTRAST TECHNIQUE: Multidetector CT imaging of the abdomen and pelvis was performed using the standard protocol following bolus administration of intravenous contrast. RADIATION DOSE REDUCTION: This exam was performed  according to the departmental dose-optimization program which includes automated exposure control, adjustment of the mA and/or kV according to patient size and/or use of iterative reconstruction technique. CONTRAST:  86m OMNIPAQUE IOHEXOL 350 MG/ML SOLN COMPARISON:  Abdominopelvic CT 07/07/2020 FINDINGS: Lower chest: Assessed on concurrent chest CT, reported separately. Hepatobiliary: Suggestion of mild hepatic steatosis. Focal fatty infiltration adjacent to the falciform ligament. There is thrombus within the intrahepatic and extrahepatic portal vein. Adjacent perivascular fat stranding. No discrete focal hepatic lesion. No convincing capsular nodularity. Cholecystectomy. The common bile duct is poorly defined on the current exam, but appears nondilated. Pancreas: Fatty atrophy. There is perivascular stranding adjacent to occluded portal and splenic veins, but no definite peripancreatic inflammation. Spleen: Massive splenomegaly with progression from prior exam. The spleen measures 19.3 x 7.4 x 23 cm (volume = 1700 cm^3). Wedge-shaped area of decreased attenuation within the anterior inferior aspect of the spleen typical of splenic infarct. The splenic vein is occluded. Adrenals/Urinary Tract: Normal adrenal glands. No hydronephrosis or perinephric edema. Homogeneous renal enhancement with symmetric excretion on delayed phase imaging. No renal calculi or evidence of solid lesion. Urinary bladder is physiologically distended without wall thickening.  Stomach/Bowel: Detailed bowel assessment is limited in the absence of enteric contrast and presence of ascites. Partially distended stomach. No small bowel obstruction, inflammation, pneumatosis or abnormal distention. Normal appendix. There is submucosal fatty infiltration throughout the colon consistent with chronic or prior inflammation. No acute inflammatory change. Vascular/Lymphatic: There is occlusion of the splenic, intra and extrahepatic portal vein, and likely the the upper aspect of superior mesenteric vein. Few venous collaterals in the upper abdomen. Normal caliber abdominal aorta no suspicious adenopathy. Reproductive: Status post hysterectomy. No adnexal masses. Other: Small volume of abdominopelvic ascites, measuring simple fluid density. No free air. Tiny fat containing umbilical hernia. 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 No acute osseous findings. IMPRESSION: 1. Massive splenomegaly with progression from prior exam. Wedge-shaped area of decreased attenuation within the anterior inferior aspect of the spleen typical of splenic infarct. 2. Occlusion of the splenic, intra and extrahepatic portal veins, and likely the upper aspect of superior mesenteric vein. 3. Small volume of abdominopelvic ascites. 4. Mild hepatic steatosis. 5. Submucosal fatty infiltration throughout the colon consistent with chronic or prior inflammation. No acute inflammatory change. 6. 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 These results were called by telephone at the time of interpretation on 02/27/2022 at 5:36 pm to provider [facility], who verbally acknowledged these results. Electronically Signed   By: MKeith RakeM.D.   On: 02/27/2022 17:36   CT Angio Chest PE W and/or Wo Contrast  Result Date: 02/27/2022 CLINICAL DATA:  Pulmonary embolism (PE) suspected, positive  D-dimer Left upper quadrant abdominal pain. EXAM: CT ANGIOGRAPHY CHEST WITH CONTRAST TECHNIQUE: Multidetector CT imaging of the chest was performed using the standard protocol during bolus administration of intravenous contrast. Multiplanar CT image reconstructions and MIPs were obtained to evaluate the vascular anatomy. Performed in conjunction with CT of the abdomen and pelvis, reported separately. RADIATION DOSE REDUCTION: This exam was performed according to the departmental dose-optimization program which includes automated exposure control, adjustment of the mA and/or kV according to patient size and/or use of iterative reconstruction technique. CONTRAST:  75 mL OMNIPAQUE IOHEXOL 350 MG/ML SOLN COMPARISON:  Most recent chest imaging: Radiograph 12/06/2019. FINDINGS: Cardiovascular: There are no filling defects within the pulmonary  arteries to suggest pulmonary embolus. The thoracic aorta is normal in caliber without acute findings. Normal heart size. No pericardial effusion. Mediastinum/Nodes: No mediastinal adenopathy or mass. No enlarged supraclavicular, axillary, or hilar lymph nodes. No esophageal wall thickening. No visible thyroid nodule. Lungs/Pleura: Trace bilateral pleural effusions. Subsegmental atelectasis in the lingula. No pneumonia or confluent airspace disease. No features of pulmonary edema. No endobronchial lesion. No pulmonary mass. Upper Abdomen: Assessed on concurrent abdominopelvic CT, reported separately. Musculoskeletal: The right second and third ribs are partially fused, may be sequela of remote injury or congenital. There are no acute or suspicious osseous abnormalities. No chest wall soft tissue abnormalities. Review of the MIP images confirms the above findings. IMPRESSION: 1. No pulmonary embolus. 2. Trace bilateral pleural effusions. Subsegmental atelectasis in the lingula. Electronically Signed   By: Keith Rake M.D.   On: 02/27/2022 17:26    Procedures .Critical  Care  Performed by: Ezequiel Essex, MD Authorized by: Ezequiel Essex, MD   Critical care provider statement:    Critical care time (minutes):  45   Critical care time was exclusive of:  Separately billable procedures and treating other patients   Critical care was necessary to treat or prevent imminent or life-threatening deterioration of the following conditions: Splenic infarct with portal vein thrombosis.   Critical care was time spent personally by me on the following activities:  Development of treatment plan with patient or surrogate, discussions with consultants, evaluation of patient's response to treatment, examination of patient, ordering and review of laboratory studies, ordering and review of radiographic studies, ordering and performing treatments and interventions, pulse oximetry, re-evaluation of patient's condition and review of old charts     Medications Ordered in ED Medications  sodium chloride 0.9 % bolus 1,000 mL (has no administration in time range)  ondansetron (ZOFRAN) injection 4 mg (has no administration in time range)  sodium chloride 0.9 % bolus 1,000 mL (has no administration in time range)    ED Course/ Medical Decision Making/ A&P                           Medical Decision Making Amount and/or Complexity of Data Reviewed Labs: ordered. Decision-making details documented in ED Course. Radiology: ordered and independent interpretation performed. Decision-making details documented in ED Course. ECG/medicine tests: ordered and independent interpretation performed. Decision-making details documented in ED Course.  Risk Prescription drug management. Decision regarding hospitalization.  Left upper quadrant abdominal pain for the past 1 month with diarrhea and nausea.  No fever.  Patient tachycardic on arrival.  Does have questionable splenomegaly on exam.  Also she has been taking up to 8 g of Tylenol daily for the past several weeks.  Will check LFTs,  lipase and acetaminophen level.  LFTs are normal.  Acetaminophen level is undetectable.  Discussed with patient that she should not be taking more than 3 g of Tylenol on a daily basis.  Concern for splenomegaly is confirmed on CT scan.  Evidence of splenic infarct as well Occlusion of splenic, portal and SMV veins.  D/w Dr Stanford Breed of vascular surgery.  He agrees with IV heparin drip and admission for pain control to [facility].  He is there is concern with patient's portal vein thrombus that could be affecting her liver especially with her recent excessive Tylenol ingestion and that her liver function today is normal and acetaminophen level is undetectable.  Discussed with poison center.  Normal LFTs and undetectable acetaminophen level  no further treatments or laboratory studies are recommended.  Patient would be extremely sick if this were the case.   Agrees with withholding N-acetylcysteine at this time given her normal LFTs and undetectable acetaminophen level.  Patient remains tachypneic and tachycardic but no evidence of PE imaging.  Plan admission for IV heparin and vascular surgery evaluation.  Patient to be admitted to [facility].  Discussed with Dr. Roosevelt Locks.         Final Clinical Impression(s) / ED Diagnoses Final diagnoses:  Splenic infarct  Splenic vein thrombosis    Rx / DC Orders ED Discharge Orders     None         Trelon Plush, Annie Main, MD 02/27/22 1911

## 2022-02-27 NOTE — ED Triage Notes (Signed)
Patient here POV from Home.  Endorses Consistent LUQ ABD Pain for approximately 1 Month that has worsened especially over the Past Few Days. LUQ and Non-Radiating.   No Known Fevers. Some Nausea. No Emesis. Diarrhea.   NAD Noted during Triage. A&Ox4. GCS 15. Ambulatory.

## 2022-02-27 NOTE — Progress Notes (Signed)
ANTICOAGULATION CONSULT NOTE - Initial Consult  Pharmacy Consult for Heparin Indication:  splenic infarct, thrombosis  No Known Allergies  Patient Measurements: Height: '5\' 3"'$  (160 cm) Weight: 103.8 kg (228 lb 13.4 oz) IBW/kg (Calculated) : 52.4 Heparin Dosing Weight: 77kg  Vital Signs: Temp: 97.6 F (36.4 C) (09/20 1526) BP: 155/90 (09/20 1700) Pulse Rate: 116 (09/20 1700)  Labs: Recent Labs    02/27/22 1530 02/27/22 1550  HGB 11.1*  --   HCT 37.8  --   PLT 428*  --   CREATININE 0.78  --   TROPONINIHS  --  <2    Estimated Creatinine Clearance: 97 mL/min (by C-G formula based on SCr of 0.78 mg/dL).   Medical History: Past Medical History:  Diagnosis Date   Acute duodenal ulcer without mention of hemorrhage, perforation, or obstruction    Dysrhythmia    History of palpations, for many years   GERD (gastroesophageal reflux disease)    Headache    takes Advil   Polycythemia vera (HCC)    Reflux esophagitis     Medications:  (Not in a hospital admission)  Scheduled:    HYDROmorphone (DILAUDID) injection  1 mg Intravenous Once   Infusions:  PRN:   Assessment: 63 yof with a history of ulcers, JAK2 positive polycythemia vera diagnosed in 2017. Patient is presenting with left-sided upper abdominal pain. Heparin per pharmacy consult placed for  splenic infarct, thrombosis .  Patient is not on anticoagulation prior to arrival.  Hgb 11.1; plt 428  Goal of Therapy:  Heparin level 0.3-0.7 units/ml Monitor platelets by anticoagulation protocol: Yes   Plan:  Give IV heparin 4500 units bolus x 1 Start heparin infusion at 1400 units/hr Check anti-Xa level in 6 hours and daily while on heparin Continue to monitor H&H and platelets  Lorelei Pont, PharmD, BCPS 02/27/2022 5:56 PM ED Clinical Pharmacist -  [phone]

## 2022-02-27 NOTE — ED Notes (Signed)
Report given to [facility] RN @ [place] given report Wes RN @ [phone]

## 2022-02-27 NOTE — Consult Note (Signed)
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  Unclear cause for thrombosis; suspect polycythemia vera.  Recommend nothing by mouth until patient's abdominal pain resolved.  Recommend anticoagulation with heparin.  No role for intervention at the moment.  Patient can transition to a DOAC once tolerating a diet.  I would recommend lifelong anticoagulation.  She has an established relationship with a hematologist, and they can discuss this further as an outpatient.   CHIEF COMPLAINT: Abdominal pain  HISTORY OF PRESENT ILLNESS: Eileen [name] is a 51 y.o. female admitted to the internal medicine service for treatment of portal vein thrombosis, splenic vein thrombosis, splenic infarct.  The patient describes a month or so long course of diffuse abdominal pain.  The patient reports this worsens severely over the past several days, prompting her presentation.  The pain has been associated with some nausea and lack of appetite.  She describes some diarrhea.  She denies any vomiting, blood in stool, or constipation.  She has no personal or family history of clotting.  She does carry diagnosis of polycythemia vera.  She has a history of peptic ulcer disease.  Past Medical History:  Diagnosis Date   Acute duodenal ulcer without mention of hemorrhage, perforation, or obstruction    Dysrhythmia    History of palpations, for many years   GERD (gastroesophageal reflux disease)    Headache    takes Advil   Polycythemia vera ([place])    Reflux esophagitis     Past Surgical History:  Procedure Laterality Date   ABDOMINAL HYSTERECTOMY  2011ish   BIOPSY  07/11/2020   Procedure: BIOPSY;  Surgeon: Mauri Pole, MD;  Location: [facility];  Service: Endoscopy;;   CESAREAN SECTION     x 3    ESOPHAGOGASTRODUODENOSCOPY (EGD) WITH PROPOFOL N/A 07/11/2020   Procedure: ESOPHAGOGASTRODUODENOSCOPY (EGD) WITH  PROPOFOL;  Surgeon: Mauri Pole, MD;  Location: [facility];  Service: Endoscopy;  Laterality: N/A;   ORIF ANKLE FRACTURE Left 12/21/2014   Procedure: OPEN REDUCTION INTERNAL FIXATION (ORIF) LEFT LATERAL MALLEOLUS ANKLE FRACTURE;  Surgeon: Leandrew Koyanagi, MD;  Location: [place];  Service: Orthopedics;  Laterality: Left;  Needs RNFA   UPPER GI ENDOSCOPY      Family History  Problem Relation Age of Onset   Migraines Mother    Breast cancer Other        Great MA    Colon cancer Neg Hx     Social History   Socioeconomic History   Marital status: Married    Spouse name: Not on file   Number of children: 3   Years of education: Not on file   Highest education level: Not on file  Occupational History   Occupation: Psychologist, forensic ANALYST    Employer: [employer]  Tobacco Use   Smoking status: Never   Smokeless tobacco: Never  Vaping Use   Vaping Use: Never used  Substance and Sexual Activity   Alcohol use: No   Drug use: No   Sexual activity: Yes    Birth control/protection: Surgical  Other Topics Concern   Not on file  Social History Narrative   No caffeine    Right handed   Lives at home with her husband and daughter   Social Determinants of Health   Financial Resource Strain: Not on file  Food Insecurity: Not on file  Transportation Needs: Not on file  Physical Activity: Not on  file  Stress: Not on file  Social Connections: Not on file  Intimate Partner Violence: Not on file    No Known Allergies  Current Facility-Administered Medications  Medication Dose Route Frequency Provider Last Rate Last Admin   heparin ADULT infusion 100 units/mL (25000 units/262m)  1,400 Units/hr Intravenous Continuous Rancour, Stephen, MD 14 mL/hr at 02/27/22 1825 1,400 Units/hr at 02/27/22 1825   HYDROmorphone (DILAUDID) injection 0.5 mg  0.5 mg Intravenous Q4H PRN ZWynetta FinesT, MD   0.5 mg at 02/27/22 2033   labetalol (NORMODYNE) injection 10 mg  10 mg Intravenous Q4H PRN ZWynetta FinesT, MD   10 mg at 02/27/22 1900   ondansetron (ZOFRAN) injection 4 mg  4 mg Intravenous Q6H PRN ZWynetta FinesT, MD   4 mg at 02/27/22 1857   Facility-Administered Medications Ordered in Other Encounters  Medication Dose Route Frequency Provider Last Rate Last Admin   gadopentetate dimeglumine (MAGNEVIST) injection 18 mL  18 mL Intravenous Once PRN AMelvenia Beam MD        PHYSICAL EXAM Vitals:   02/27/22 1945 02/27/22 2000 02/27/22 2015 02/27/22 2117  BP: (!) 140/82 131/80 134/82 (!) 153/76  Pulse: (!) 108 (!) 107 (!) 108 (!) 109  Resp: 18 20 (!) 28 20  Temp:    (!) 101.9 F (38.8 C)  TempSrc:    Oral  SpO2: 97% 95% 97% 98%  Weight:      Height:       Middle-aged woman.  Appears mildly uncomfortable.  In no acute distress Regular rate and rhythm Unlabored breathing Soft, nondistended abdomen.  Obese.  Tender in the left upper quadrant.  Palpable spleen which is tender.  PERTINENT LABORATORY AND RADIOLOGIC DATA  Most recent CBC    Latest Ref Rng & Units 02/27/2022    3:30 PM 02/06/2022    2:40 PM 10/07/2021    8:39 PM  CBC  WBC 4.0 - 10.5 K/uL 9.7  7.2  7.4   Hemoglobin 12.0 - 15.0 g/dL 11.1  12.0  13.0   Hematocrit 36.0 - 46.0 % 37.8  40.7  46.0   Platelets 150 - 400 K/uL 428  298  603      Most recent CMP    Latest Ref Rng & Units 02/27/2022    3:30 PM 10/07/2021    8:39 PM 07/11/2020    5:18 AM  CMP  Glucose 70 - 99 mg/dL 112  96  81   BUN 6 - 20 mg/dL '12  15  13   '$ Creatinine 0.44 - 1.00 mg/dL 0.78  0.92  0.73   Sodium 135 - 145 mmol/L 137  139  137   Potassium 3.5 - 5.1 mmol/L 3.6  4.3  3.6   Chloride 98 - 111 mmol/L 103  105  108   CO2 22 - 32 mmol/L '22  28  19   '$ Calcium 8.9 - 10.3 mg/dL 9.2  9.5  8.8   Total Protein 6.5 - 8.1 g/dL 7.6  7.9  6.4   Total Bilirubin 0.3 - 1.2 mg/dL 0.7  0.5  0.9   Alkaline Phos 38 - 126 U/L 93  83  71   AST 15 - 41 U/L '10  21  10   '$ ALT 0 - 44 U/L '5  13  9    '$ CT scan of the abdomen and pelvis personally reviewed.  Concerning for splenomegaly, focal splenic infarct, portal vein thrombosis, splenic vein thrombosis. No evidence of pneumatosis or  other secondary signs of mesenteric ischemia.   Yevonne Aline. Stanford Breed, MD [facility] Phone Number: [phone] 02/27/2022 10:11 PM  Total time spent on preparing this encounter including chart review, data review, collecting history, examining the patient, coordinating care for this new patient, 60 minutes.  Portions of this report may have been transcribed using voice recognition software.  Every effort has been made to ensure accuracy; however, inadvertent computerized transcription errors may still be present.

## 2022-02-27 NOTE — Plan of Care (Signed)
Discussed case in detail with Dr. Wyvonnia Dusky. 51yF otherwise healthy presents to [facility] ER with left upper quadrant mass and palpable spleen. History of PV, but otherwise fairly healthy. Patient reports >8gm Tylenol / day over the past several days-weeks.   CT scan personally reviewed. Concerning for splenomegaly, focal splenic infarct, portal vein thrombosis, splenic vein thrombosis. No evidence of pneumatosis or other secondary signs of mesenteric ischemia.   Typically no intervention is indicated for mesenteric venous thrombosis. Dr. Wyvonnia Dusky is initiating heparin therapy in the [facility] ER. 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 Polycythemia may be the culprit here.   Please call when she arrives, and I will examine her.  Eileen [name]. Stanford Breed, MD [facility] Phone Number: [phone] 02/27/2022 7:25 PM

## 2022-02-27 NOTE — ED Notes (Signed)
Pt transported to CT ?

## 2022-02-27 NOTE — H&P (Addendum)
History and Physical  Eileen [name] TOI:[id number] DOB: 1970-07-31 DOA: 02/27/2022  Referring physician: Accepted by Dr. Roosevelt Locks, [facility].  PCP: [name], MD  Outpatient Specialists: Hematology oncology. Patient coming from: Home through to [facility] ED.  Chief Complaint: Left upper quadrant abdominal pain  HPI: Eileen [name] is a 51 y.o. 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  The pain is worse with deep inspiration and palpation.  Associated with nonbloody loose stools 4-5 times a day, nausea without vomiting, and poor oral intake.  Endorses taking 2 g of Tylenol up to 4 times daily for the past several weeks to control her abdominal pain.    In the ED, acetaminophen level less than 10.  LFTs unremarkable.  CT angio showed massive splenomegaly and splenic infarct.  PE study was negative.  EDP discussed the case with vascular surgery.  The patient was started on heparin drip in the ED and transferred to [facility] for further evaluation and management.  Accepted by [facility], hospitalist service.  At the time of this visit at [facility], the patient reports no pain.  She received a dose of IV Dilaudid at [facility] ED prior to her transfer.  ED Course: Tmax 101.9.  BP 153/76, pulse 109, respiratory 20, O2 saturation 98% on room air.  Lab studies remarkable for hemoglobin 11.1, MCV 72, platelet count 428.  Review of Systems: Review of systems as noted in the HPI. All other systems reviewed and are negative.   Past Medical History:  Diagnosis Date   Acute duodenal ulcer without mention of hemorrhage, perforation, or obstruction    Dysrhythmia    History of palpations, for many years   GERD (gastroesophageal reflux disease)    Headache    takes Advil    Polycythemia vera ([place])    Reflux esophagitis    Past Surgical History:  Procedure Laterality Date   ABDOMINAL HYSTERECTOMY  2011ish   BIOPSY  07/11/2020   Procedure: BIOPSY;  Surgeon: Mauri Pole, MD;  Location: [facility];  Service: Endoscopy;;   CESAREAN SECTION     x 3    ESOPHAGOGASTRODUODENOSCOPY (EGD) WITH PROPOFOL N/A 07/11/2020   Procedure: ESOPHAGOGASTRODUODENOSCOPY (EGD) WITH PROPOFOL;  Surgeon: Mauri Pole, MD;  Location: [facility];  Service: Endoscopy;  Laterality: N/A;   ORIF ANKLE FRACTURE Left 12/21/2014   Procedure: OPEN REDUCTION INTERNAL FIXATION (ORIF) LEFT LATERAL MALLEOLUS ANKLE FRACTURE;  Surgeon: Leandrew Koyanagi, MD;  Location: [place];  Service: Orthopedics;  Laterality: Left;  Needs RNFA   UPPER GI ENDOSCOPY      Social History:  reports that she has never smoked. She has never used smokeless tobacco. She reports that she does not drink alcohol and does not use drugs.   No Known Allergies  Family History  Problem Relation Age of Onset   Migraines Mother    Breast cancer Other        Great [place]    Colon cancer Neg Hx       Prior to Admission medications   Medication Sig Start Date End Date Taking? Authorizing Provider  ferrous sulfate 325 (65 FE) MG EC tablet Take 1 tablet (325 mg total) by mouth daily. 08/11/20   [name], MD  gabapentin (NEURONTIN) 100 MG capsule Take 1 capsule (100  mg total) by mouth at bedtime. 12/19/20   [name], MD  hydroxyurea (HYDREA) 500 MG capsule Take 1 capsule (500 mg total) by mouth daily. May take with food to minimize GI side effects. 05/11/21   [name], MD  JAKAFI 10 MG tablet TAKE 1 TABLET BY MOUTH TWICE  DAILY 12/17/21   [name], MD  pantoprazole (PROTONIX) 40 MG tablet Take 1 tablet (40 mg total) by mouth 2 (two) times daily. 07/11/20 07/11/21  Georgette Shell, MD  sucralfate (CARAFATE) 1 g tablet Take 1 tablet (1 g total) by mouth 4 (four) times daily -  with meals and at bedtime.  07/11/20   Georgette Shell, MD    Physical Exam: BP (!) 153/76 (BP Location: Right Arm)   Pulse (!) 109   Temp (!) 101.9 F (38.8 C) (Oral)   Resp 20   Ht '5\' 3"'$  (1.6 m)   Wt 103.8 kg   SpO2 98%   BMI 40.54 kg/m   General: 51 y.o. year-old female well developed well nourished in no acute distress.  Alert and oriented x3. Cardiovascular: Regular rate and rhythm with no rubs or gallops.  No thyromegaly or JVD noted.  No lower extremity edema. 2/4 pulses in all 4 extremities. Respiratory: Clear to auscultation with no wheezes or rales. Good inspiratory effort. Abdomen:  Obese, mildly distended, left upper quadrant pain with palpation.  Normal bowel sounds x4 quadrants. Muskuloskeletal: No cyanosis, clubbing or edema noted bilaterally Neuro: CN II-XII intact, strength, sensation, reflexes Skin: No ulcerative lesions noted or rashes Psychiatry: Judgement and insight appear normal. Mood is appropriate for condition and setting          Labs on Admission:  Basic Metabolic Panel: Recent Labs  Lab 02/27/22 1530  NA 137  K 3.6  CL 103  CO2 22  GLUCOSE 112*  BUN 12  CREATININE 0.78  CALCIUM 9.2   Liver Function Tests: Recent Labs  Lab 02/27/22 1530  AST 10*  ALT <5  ALKPHOS 93  BILITOT 0.7  PROT 7.6  ALBUMIN 4.0   Recent Labs  Lab 02/27/22 1530  LIPASE <10*   No results for input(s): "AMMONIA" in the last 168 hours. CBC: Recent Labs  Lab 02/27/22 1530  WBC 9.7  HGB 11.1*  HCT 37.8  MCV 72.8*  PLT 428*   Cardiac Enzymes: No results for input(s): "CKTOTAL", "CKMB", "CKMBINDEX", "TROPONINI" in the last 168 hours.  BNP (last 3 results) No results for input(s): "BNP" in the last 8760 hours.  ProBNP (last 3 results) No results for input(s): "PROBNP" in the last 8760 hours.  CBG: No results for input(s): "GLUCAP" in the last 168 hours.  Radiological Exams on Admission: CT ABDOMEN PELVIS W CONTRAST  Result Date: 02/27/2022 CLINICAL DATA:  Acute  abdominal pain. Left upper quadrant mass, spleen?. Radiologic records indicates history of polycythemia vera. EXAM: CT ABDOMEN AND PELVIS WITH CONTRAST TECHNIQUE: Multidetector CT imaging of the abdomen and pelvis was performed using the standard protocol following bolus administration of intravenous contrast. RADIATION DOSE REDUCTION: This exam was performed according to the departmental dose-optimization program which includes automated exposure control, adjustment of the mA and/or kV according to patient size and/or use of iterative reconstruction technique. CONTRAST:  67m OMNIPAQUE IOHEXOL 350 MG/ML SOLN COMPARISON:  Abdominopelvic CT 07/07/2020 FINDINGS: Lower chest: Assessed on concurrent chest CT, reported separately. Hepatobiliary: Suggestion of mild hepatic steatosis. Focal fatty infiltration adjacent to the falciform ligament. There is thrombus within the  intrahepatic and extrahepatic portal vein. Adjacent perivascular fat stranding. No discrete focal hepatic lesion. No convincing capsular nodularity. Cholecystectomy. The common bile duct is poorly defined on the current exam, but appears nondilated. Pancreas: Fatty atrophy. There is perivascular stranding adjacent to occluded portal and splenic veins, but no definite peripancreatic inflammation. Spleen: Massive splenomegaly with progression from prior exam. The spleen measures 19.3 x 7.4 x 23 cm (volume = 1700 cm^3). Wedge-shaped area of decreased attenuation within the anterior inferior aspect of the spleen typical of splenic infarct. The splenic vein is occluded. Adrenals/Urinary Tract: Normal adrenal glands. No hydronephrosis or perinephric edema. Homogeneous renal enhancement with symmetric excretion on delayed phase imaging. No renal calculi or evidence of solid lesion. Urinary bladder is physiologically distended without wall thickening. Stomach/Bowel: Detailed bowel assessment is limited in the absence of enteric contrast and presence of ascites.  Partially distended stomach. No small bowel obstruction, inflammation, pneumatosis or abnormal distention. Normal appendix. There is submucosal fatty infiltration throughout the colon consistent with chronic or prior inflammation. No acute inflammatory change. Vascular/Lymphatic: There is occlusion of the splenic, intra and extrahepatic portal vein, and likely the the upper aspect of superior mesenteric vein. Few venous collaterals in the upper abdomen. Normal caliber abdominal aorta no suspicious adenopathy. Reproductive: Status post hysterectomy. No adnexal masses. Other: Small volume of abdominopelvic ascites, measuring simple fluid density. No free air. Tiny fat containing umbilical hernia. 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 No acute osseous findings. IMPRESSION: 1. Massive splenomegaly with progression from prior exam. Wedge-shaped area of decreased attenuation within the anterior inferior aspect of the spleen typical of splenic infarct. 2. Occlusion of the splenic, intra and extrahepatic portal veins, and likely the upper aspect of superior mesenteric vein. 3. Small volume of abdominopelvic ascites. 4. Mild hepatic steatosis. 5. Submucosal fatty infiltration throughout the colon consistent with chronic or prior inflammation. No acute inflammatory change. 6. 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 These results were called by telephone at the time of interpretation on 02/27/2022 at 5:36 pm to provider [facility] , who verbally acknowledged these results. Electronically Signed   By: Keith Rake M.D.   On: 02/27/2022 17:36   CT Angio Chest PE W and/or Wo Contrast  Result Date: 02/27/2022 CLINICAL DATA:  Pulmonary embolism (PE) suspected, positive D-dimer Left upper quadrant abdominal pain. EXAM: CT ANGIOGRAPHY CHEST WITH CONTRAST TECHNIQUE: Multidetector CT  imaging of the chest was performed using the standard protocol during bolus administration of intravenous contrast. Multiplanar CT image reconstructions and MIPs were obtained to evaluate the vascular anatomy. Performed in conjunction with CT of the abdomen and pelvis, reported separately. RADIATION DOSE REDUCTION: This exam was performed according to the departmental dose-optimization program which includes automated exposure control, adjustment of the mA and/or kV according to patient size and/or use of iterative reconstruction technique. CONTRAST:  75 mL OMNIPAQUE IOHEXOL 350 MG/ML SOLN COMPARISON:  Most recent chest imaging: Radiograph 12/06/2019. FINDINGS: Cardiovascular: There are no filling defects within the pulmonary arteries to suggest pulmonary embolus. The thoracic aorta is normal in caliber without acute findings. Normal heart size. No pericardial effusion. Mediastinum/Nodes: No mediastinal adenopathy or mass. No enlarged supraclavicular, axillary, or hilar lymph nodes. No esophageal wall thickening. No visible thyroid nodule. Lungs/Pleura: Trace bilateral pleural effusions. Subsegmental atelectasis in the lingula. No pneumonia or confluent airspace disease. No features of pulmonary edema. No endobronchial lesion. No pulmonary  mass. Upper Abdomen: Assessed on concurrent abdominopelvic CT, reported separately. Musculoskeletal: The right second and third ribs are partially fused, may be sequela of remote injury or congenital. There are no acute or suspicious osseous abnormalities. No chest wall soft tissue abnormalities. Review of the MIP images confirms the above findings. IMPRESSION: 1. No pulmonary embolus. 2. Trace bilateral pleural effusions. Subsegmental atelectasis in the lingula. Electronically Signed   By: Keith Rake M.D.   On: 02/27/2022 17:26    EKG: I independently viewed the EKG done and my findings are as followed: Sinus tachycardia rate of 107.  Nonspecific ST-T today.  QTc  461.  Assessment/Plan Present on Admission:  Splenic infarct  Principal Problem:   Splenic infarct  Splenic infarct, seen on CT scan, unclear etiology, POA. 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 Closely monitor vital signs or evidence of bleeding PRN analgesics  Hepatic vein thrombosis, seen on CT scan Currently on heparin drip Vascular surgery following  Polycythemia vera on hydroxyurea and Jakafi Hold off chemotherapy drugs for now due to acute thrombosis Consult hematology in the morning.  SIRS Tmax 101.9, pulse 109 No clear evidence of active infective process at this time. Obtain COVID-19 screening test Follow blood cultures x2 peripherally Monitor fever curve and WBC  Elevated BP, may be related to uncontrolled pain Control pain IV antihypertensives as needed with parameters  Peripheral neuropathy on low-dose gabapentin Resume home gabapentin  Severe morbid obesity BMI 40 Recommend weight loss outpatient with regular physical activity and healthy dieting.    DVT prophylaxis: Heparin drip  Code Status: Full code  Family Communication: None at bedside.  Disposition Plan: Accepted by Dr. Roosevelt Locks to [facility] telemetry medical unit.  Consults called: Vascular surgery consulted by EDP.  Admission status: Inpatient status.   Status is: Inpatient The patient requires at least 2 midnights for further evaluation and treatment of present condition.   Kayleen Memos MD [facility] Pager [phone]  If 7PM-7AM, please contact night-coverage [email or web address] Password [email or web address]  02/27/2022, 10:12 PM

## 2022-02-28 DIAGNOSIS — D735 Infarction of spleen: Secondary | ICD-10-CM | POA: Diagnosis not present

## 2022-02-28 DIAGNOSIS — I81 Portal vein thrombosis: Secondary | ICD-10-CM | POA: Diagnosis not present

## 2022-02-28 LAB — PHOSPHORUS: Phosphorus: 3.1 mg/dL (ref 2.5–4.6)

## 2022-02-28 LAB — COMPREHENSIVE METABOLIC PANEL
ALT: 9 U/L (ref 0–44)
AST: 11 U/L — ABNORMAL LOW (ref 15–41)
Albumin: 2.7 g/dL — ABNORMAL LOW (ref 3.5–5.0)
Alkaline Phosphatase: 82 U/L (ref 38–126)
Anion gap: 11 (ref 5–15)
BUN: 7 mg/dL (ref 6–20)
CO2: 16 mmol/L — ABNORMAL LOW (ref 22–32)
Calcium: 8 mg/dL — ABNORMAL LOW (ref 8.9–10.3)
Chloride: 108 mmol/L (ref 98–111)
Creatinine, Ser: 0.74 mg/dL (ref 0.44–1.00)
GFR, Estimated: >60 mL/min (ref >60)
Glucose, Bld: 112 mg/dL — ABNORMAL HIGH (ref 70–99)
Potassium: 3.2 mmol/L — ABNORMAL LOW (ref 3.5–5.1)
Sodium: 135 mmol/L (ref 135–145)
Total Bilirubin: 0.9 mg/dL (ref 0.3–1.2)
Total Protein: 5.9 g/dL — ABNORMAL LOW (ref 6.5–8.1)

## 2022-02-28 LAB — CBC WITH DIFFERENTIAL/PLATELET
Abs Immature Granulocytes: 0.05 10*3/uL (ref 0.00–0.07)
Basophils Absolute: 0.2 10*3/uL — ABNORMAL HIGH (ref 0.0–0.1)
Basophils Relative: 2 %
Eosinophils Absolute: 0.1 10*3/uL (ref 0.0–0.5)
Eosinophils Relative: 1 %
HCT: 32.5 % — ABNORMAL LOW (ref 36.0–46.0)
Hemoglobin: 9.5 g/dL — ABNORMAL LOW (ref 12.0–15.0)
Immature Granulocytes: 1 %
Lymphocytes Relative: 15 %
Lymphs Abs: 1.2 10*3/uL (ref 0.7–4.0)
MCH: 21.2 pg — ABNORMAL LOW (ref 26.0–34.0)
MCHC: 29.2 g/dL — ABNORMAL LOW (ref 30.0–36.0)
MCV: 72.4 fL — ABNORMAL LOW (ref 80.0–100.0)
Monocytes Absolute: 0.6 10*3/uL (ref 0.1–1.0)
Monocytes Relative: 7 %
Neutro Abs: 6.3 10*3/uL (ref 1.7–7.7)
Neutrophils Relative %: 74 %
Platelets: 305 10*3/uL (ref 150–400)
RBC: 4.49 MIL/uL (ref 3.87–5.11)
RDW: 22.5 % — ABNORMAL HIGH (ref 11.5–15.5)
WBC: 8.4 10*3/uL (ref 4.0–10.5)
nRBC: 0 % (ref 0.0–0.2)

## 2022-02-28 LAB — HEPARIN LEVEL (UNFRACTIONATED)
Heparin Unfractionated: <0.1 IU/mL — ABNORMAL LOW (ref 0.30–0.70)
Heparin Unfractionated: <0.1 IU/mL — ABNORMAL LOW (ref 0.30–0.70)
Heparin Unfractionated: <0.1 IU/mL — ABNORMAL LOW (ref 0.30–0.70)

## 2022-02-28 LAB — MAGNESIUM: Magnesium: 1.9 mg/dL (ref 1.7–2.4)

## 2022-02-28 LAB — SARS CORONAVIRUS 2 BY RT PCR: SARS Coronavirus 2 by RT PCR: NEGATIVE

## 2022-02-28 LAB — HIV ANTIBODY (ROUTINE TESTING W REFLEX): HIV Screen 4th Generation wRfx: NONREACTIVE

## 2022-02-28 MED ORDER — SODIUM CHLORIDE 0.9 % IV SOLN: INTRAVENOUS | Status: DC

## 2022-02-28 MED ORDER — SENNOSIDES-DOCUSATE SODIUM 8.6-50 MG PO TABS: 1.0000 | ORAL_TABLET | Freq: Every evening | ORAL | Status: DC | PRN

## 2022-02-28 MED ORDER — IPRATROPIUM-ALBUTEROL 0.5-2.5 (3) MG/3ML IN SOLN
3.0000 mL | RESPIRATORY_TRACT | Status: DC | PRN
  Administered 2022-03-05 (×2): 3 mL via RESPIRATORY_TRACT
  Filled 2022-02-28 (×2): qty 3

## 2022-02-28 MED ORDER — HYDRALAZINE HCL 20 MG/ML IJ SOLN: 10.0000 mg | INTRAMUSCULAR | Status: DC | PRN

## 2022-02-28 MED ORDER — OXYCODONE HCL 5 MG PO TABS
5.0000 mg | ORAL_TABLET | ORAL | Status: DC | PRN
  Administered 2022-02-28 – 2022-03-03 (×12): 5 mg via ORAL
  Filled 2022-02-28 (×12): qty 1

## 2022-02-28 MED ORDER — HEPARIN BOLUS VIA INFUSION
2000.0000 [IU] | Freq: Once | INTRAVENOUS | Status: AC
  Administered 2022-02-28: 2000 [IU] via INTRAVENOUS
  Filled 2022-02-28: qty 2000

## 2022-02-28 MED ORDER — HEPARIN BOLUS VIA INFUSION
2300.0000 [IU] | Freq: Once | INTRAVENOUS | Status: AC
  Administered 2022-02-28: 2300 [IU] via INTRAVENOUS
  Filled 2022-02-28: qty 2300

## 2022-02-28 MED ORDER — POTASSIUM CHLORIDE CRYS ER 20 MEQ PO TBCR
40.0000 meq | EXTENDED_RELEASE_TABLET | Freq: Once | ORAL | Status: AC
  Administered 2022-02-28: 40 meq via ORAL
  Filled 2022-02-28: qty 2

## 2022-02-28 MED ORDER — ACETAMINOPHEN 325 MG PO TABS
650.0000 mg | ORAL_TABLET | Freq: Four times a day (QID) | ORAL | Status: DC | PRN
  Administered 2022-02-28 – 2022-03-06 (×11): 650 mg via ORAL
  Filled 2022-02-28 (×12): qty 2

## 2022-02-28 MED ORDER — TRAZODONE HCL 50 MG PO TABS: 50.0000 mg | ORAL_TABLET | Freq: Every evening | ORAL | Status: DC | PRN

## 2022-02-28 MED ORDER — METOPROLOL TARTRATE 5 MG/5ML IV SOLN: 5.0000 mg | INTRAVENOUS | Status: DC | PRN

## 2022-02-28 NOTE — Progress Notes (Addendum)
  Progress Note    02/28/2022 7:40 AM * No surgery found *  Subjective:  Still having some abdominal pain, less than yesterday. No N/V overnight    Vitals:   02/28/22 0435 02/28/22 0727  BP: 121/67 120/70  Pulse: 100 (!) 101  Resp: 16 16  Temp: 99.1 F (37.3 C) 99.5 F (37.5 C)  SpO2: 95% 94%    Physical Exam: Lungs:  nonlabored Extremities:  moving freely Abdomen:  soft, nondistended. Tender in the LUQ and spleen  CBC    Component Value Date/Time   WBC 8.4 02/28/2022 0110   RBC 4.49 02/28/2022 0110   HGB 9.5 (L) 02/28/2022 0110   HGB 12.0 02/06/2022 1440   HGB 13.4 05/21/2017 1519   HCT 32.5 (L) 02/28/2022 0110   HCT 46.2 05/21/2017 1519   PLT 305 02/28/2022 0110   PLT 298 02/06/2022 1440   PLT 849 (H) 05/21/2017 1519   MCV 72.4 (L) 02/28/2022 0110   MCV 65.7 (L) 05/21/2017 1519   MCH 21.2 (L) 02/28/2022 0110   MCHC 29.2 (L) 02/28/2022 0110   RDW 22.5 (H) 02/28/2022 0110   RDW 21.5 (H) 05/21/2017 1519   LYMPHSABS 1.2 02/28/2022 0110   LYMPHSABS 2.2 05/21/2017 1519   MONOABS 0.6 02/28/2022 0110   MONOABS 0.6 05/21/2017 1519   EOSABS 0.1 02/28/2022 0110   EOSABS 0.9 (H) 05/21/2017 1519   BASOSABS 0.2 (H) 02/28/2022 0110   BASOSABS 0.5 (H) 05/21/2017 1519    BMET    Component Value Date/Time   NA 135 02/28/2022 0110   NA 140 02/20/2016 1432   K 3.2 (L) 02/28/2022 0110   K 4.1 02/20/2016 1432   CL 108 02/28/2022 0110   CO2 16 (L) 02/28/2022 0110   CO2 25 02/20/2016 1432   GLUCOSE 112 (H) 02/28/2022 0110   GLUCOSE 154 (H) 02/20/2016 1432   BUN 7 02/28/2022 0110   BUN 11.1 02/20/2016 1432   CREATININE 0.74 02/28/2022 0110   CREATININE 1.0 02/20/2016 1432   CALCIUM 8.0 (L) 02/28/2022 0110   CALCIUM 9.8 02/20/2016 1432   GFRNONAA >60 02/28/2022 0110   GFRAA >60 12/06/2019 1239    INR    Component Value Date/Time   INR 1.4 (H) 07/11/2020 0518     Intake/Output Summary (Last 24 hours) at 02/28/2022 0740 Last data filed at 02/27/2022  1905 Gross per 24 hour  Intake 1900 ml  Output --  Net 1900 ml     Assessment/Plan:  51 y.o. female with portal vein thrombosis, splenic vein thrombosis, and splenic infarct   -Reduced pain from yesterday, still requiring pain medication -No N/V. No flatus or stools since yesterday -Abdomen is soft and nondistended, still tender in LUQ and spleen -Keep NPO until patient is pain free -Continue heparin drip. Can transition to [place] once on a normal diet   Vicente Serene, [place] [facility] [phone] 02/28/2022 7:40 AM   VASCULAR STAFF ADDENDUM: I have independently interviewed and examined the patient. I agree with the above.   Yevonne Aline. Stanford Breed, MD [facility] Phone Number: [phone] 02/28/2022 10:08 AM

## 2022-02-28 NOTE — Progress Notes (Signed)
[place] for Heparin Indication:  splenic infarct, thrombosis  No Known Allergies  Patient Measurements: Height: '5\' 3"'$  (160 cm) Weight: 103.8 kg (228 lb 13.4 oz) IBW/kg (Calculated) : 52.4 Heparin Dosing Weight: 77kg  Vital Signs: Temp: 98.6 F (37 C) (09/20 2321) Temp Source: Oral (09/20 2321) BP: 114/59 (09/20 2321) Pulse Rate: 101 (09/20 2321)  Labs: Recent Labs    02/27/22 1530 02/27/22 1550 02/28/22 0110  HGB 11.1*  --  9.5*  HCT 37.8  --  32.5*  PLT 428*  --  305  HEPARINUNFRC  --   --  <0.10*  CREATININE 0.78  --   --   TROPONINIHS  --  <2  --      Estimated Creatinine Clearance: 97 mL/min (by C-G formula based on SCr of 0.78 mg/dL).   Assessment: 69 yof with a history of ulcers, JAK2 positive polycythemia vera diagnosed in 2017. Patient is presenting with left-sided upper abdominal pain. Heparin per pharmacy consult placed for  splenic infarct, thrombosis .  Initial heparin level is undetectable.  No issue with heparin infusion nor bleeding per discussion with RN.  Goal of Therapy:  Heparin level 0.3-0.7 units/ml Monitor platelets by anticoagulation protocol: Yes   Plan:  IV heparin 2300 units bolus x 1, then Increase heparin infusion to 1700 units/hr Check anti-Xa level in 6 hours  Maurine Mowbray D. Mina Marble, PharmD, BCPS, [place] 02/28/2022, 1:57 AM

## 2022-02-28 NOTE — Progress Notes (Signed)
ANTICOAGULATION CONSULT NOTE  Pharmacy Consult for Heparin Indication: portal vein thrombosis, splenic vein thrombosis, splenic infarct  No Known Allergies  Patient Measurements: Height: '5\' 3"'$  (160 cm) Weight: 103.8 kg (228 lb 13.4 oz) IBW/kg (Calculated) : 52.4  Heparin Dosing Weight: 77 kg  Vital Signs: Temp: 98.6 F (37 C) (09/21 1225) Temp Source: Oral (09/21 1225) BP: 128/63 (09/21 1225) Pulse Rate: 104 (09/21 1225)  Labs: Recent Labs    02/27/22 1530 02/27/22 1550 02/28/22 0110 02/28/22 0852  HGB 11.1*  --  9.5*  --   HCT 37.8  --  32.5*  --   PLT 428*  --  305  --   HEPARINUNFRC  --   --  <0.10* <0.10*  CREATININE 0.78  --  0.74  --   TROPONINIHS  --  <2  --   --     Estimated Creatinine Clearance: 97 mL/min (by C-G formula based on SCr of 0.74 mg/dL).   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 Heparin per pharmacy consult placed for  splenic infarct, thrombosis.  Per discussion with RN, heparin infusion may have been interrupted several times between heparin levels due to non-compatible IV medications needing to be given. Patient now at 2nd PIV dedicated to IV heparin infusion and should have no further interruptions. No issues with bleeding or with heparin line itself. CBC stable. Will increase cautiously and check heparin level in 6h.  Goal of Therapy:  Heparin level 0.3-0.7 units/ml Monitor platelets by anticoagulation protocol: Yes   Plan:  Increase heparin infusion to 1850 units/hr Check heparin level in 6 hours and daily while on heparin Continue to monitor H&H and platelets    Thank you for allowing pharmacy to be a part of this patient's care.  Ardyth Harps, PharmD Clinical Pharmacist

## 2022-02-28 NOTE — Progress Notes (Signed)
PROGRESS NOTE    Eileen [name]  ZOX:[id number] DOB: 1970-10-01 DOA: 02/27/2022 PCP: [name], MD   Brief Narrative:   51 y.o. 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  angio showed massive splenomegaly and splenic infarct.  PE study was negative.     Assessment & Plan:  Principal Problem:   Splenic infarct   Splenic infarct, seen on CT scan, unclear etiology, POA. 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  Currently n.p.o., on heparin drip with eventual plans to transition to oral anticoagulation.  Seen by vascular surgery, monitor supportive care.  Pain control   Polycythemia vera on hydroxyurea and Jakafi Microcytic anemia; baseline Hb 12.0 Follows with outpatient Dr. [name].  On Jakafi outpatient -Hemoglobin has drifted down to 9.8.  Anemia is related to her previous phlebotomy.  Her last phlebotomy was at least 2-3 months ago. I have briefly discussed case with Dr. [name] who agrees with current management including anticoagulation and eventually transitioning her to Eliquis.   SIRS No obvious evidence of infection.  COVID-19 negative.  Monitor fever curve  Hypokalemia - Repletion   Elevated BP, may be related to uncontrolled pain Pain control.  As needed medications ordered   Peripheral neuropathy on low-dose gabapentin Resume home gabapentin   Severe morbid obesity BMI 40 Recommend weight loss outpatient with regular physical activity and healthy dieting.   DVT prophylaxis: Hep Drip Code Status: Full Code Family Communication:    Status is: Inpatient Maintain hospital stay until abdominal symptoms have improved.    Subjective: She still having quite a bit of abdominal pain especially  with any p.o. intake.   Examination:  General exam: Appears calm and comfortable  Respiratory system: Clear to auscultation. Respiratory effort normal. Cardiovascular system: S1 & S2 heard, RRR. No JVD, murmurs, rubs, gallops or clicks. No pedal edema. Gastrointestinal system: Tender to deep palpation in the left upper quadrant Central nervous system: Alert and oriented. No focal neurological deficits. Extremities: Symmetric 5 x 5 power. Skin: No rashes, lesions or ulcers Psychiatry: Judgement and insight appear normal. Mood & affect appropriate.     Objective: Vitals:   02/27/22 2117 02/27/22 2321 02/28/22 0435 02/28/22 0727  BP: (!) 153/76 (!) 114/59 121/67 120/70  Pulse: (!) 109 (!) 101 100 (!) 101  Resp: '20 17 16 16  '$ Temp: (!) 101.9 F (38.8 C) 98.6 F (37 C) 99.1 F (37.3 C) 99.5 F (37.5 C)  TempSrc: Oral Oral Oral Oral  SpO2: 98% 95% 95% 94%  Weight:      Height:        Intake/Output Summary (Last 24 hours) at 02/28/2022 0950 Last data filed at 02/27/2022 1905 Gross per 24 hour  Intake 1900 ml  Output --  Net 1900 ml   Filed Weights   02/27/22 1525  Weight: 103.8 kg     Data Reviewed:   CBC: Recent Labs  Lab 02/27/22 1530 02/28/22 0110  WBC 9.7 8.4  NEUTROABS  --  6.3  HGB 11.1* 9.5*  HCT 37.8 32.5*  MCV 72.8* 72.4*  PLT 428* 811   Basic Metabolic Panel: Recent Labs  Lab 02/27/22 1530 02/28/22 0110  NA 137 135  K 3.6 3.2*  CL 103 108  CO2 22 16*  GLUCOSE 112* 112*  BUN  12 7  CREATININE 0.78 0.74  CALCIUM 9.2 8.0*  MG  --  1.9  PHOS  --  3.1   GFR: Estimated Creatinine Clearance: 97 mL/min (by C-G formula based on SCr of 0.74 mg/dL). Liver Function Tests: Recent Labs  Lab 02/27/22 1530 02/28/22 0110  AST 10* 11*  ALT <5 9  ALKPHOS 93 82  BILITOT 0.7 0.9  PROT 7.6 5.9*  ALBUMIN 4.0 2.7*   Recent Labs  Lab 02/27/22 1530  LIPASE <10*   No results for input(s): "AMMONIA" in the last 168 hours. Coagulation Profile: No  results for input(s): "INR", "PROTIME" in the last 168 hours. Cardiac Enzymes: No results for input(s): "CKTOTAL", "CKMB", "CKMBINDEX", "TROPONINI" in the last 168 hours. BNP (last 3 results) No results for input(s): "PROBNP" in the last 8760 hours. HbA1C: No results for input(s): "HGBA1C" in the last 72 hours. CBG: No results for input(s): "GLUCAP" in the last 168 hours. Lipid Profile: No results for input(s): "CHOL", "HDL", "LDLCALC", "TRIG", "CHOLHDL", "LDLDIRECT" in the last 72 hours. Thyroid Function Tests: No results for input(s): "TSH", "T4TOTAL", "FREET4", "T3FREE", "THYROIDAB" in the last 72 hours. Anemia Panel: No results for input(s): "VITAMINB12", "FOLATE", "FERRITIN", "TIBC", "IRON", "RETICCTPCT" in the last 72 hours. Sepsis Labs: No results for input(s): "PROCALCITON", "LATICACIDVEN" in the last 168 hours.  Recent Results (from the past 240 hour(s))  SARS Coronavirus 2 by RT PCR (hospital order, performed in [facility] hospital lab) *cepheid single result test* Anterior Nasal Swab     Status: None   Collection Time: 02/27/22 11:33 PM   Specimen: Anterior Nasal Swab  Result Value Ref Range Status   SARS Coronavirus 2 by RT PCR NEGATIVE NEGATIVE Final    Comment: (NOTE) SARS-CoV-2 target nucleic acids are NOT DETECTED.  The SARS-CoV-2 RNA is generally detectable in upper and lower respiratory specimens during the acute phase of infection. The lowest concentration of SARS-CoV-2 viral copies this assay can detect is 250 copies / mL. A negative result does not preclude SARS-CoV-2 infection and should not be used as the sole basis for treatment or other patient management decisions.  A negative result may occur with improper specimen collection / handling, submission of specimen other than nasopharyngeal swab, presence of viral mutation(s) within the areas targeted by this assay, and inadequate number of viral copies (<250 copies / mL). A negative result must be combined  with clinical observations, patient history, and epidemiological information.  Fact Sheet for Patients:   https://www.patel.info/  Fact Sheet for Healthcare Providers: https://hall.com/  This test is not yet approved or  cleared by the [place] FDA and has been authorized for detection and/or diagnosis of SARS-CoV-2 by FDA under an Emergency Use Authorization (EUA).  This EUA will remain in effect (meaning this test can be used) for the duration of the COVID-19 declaration under Section 564(b)(1) of the Act, 21 U.S.C. section 360bbb-3(b)(1), unless the authorization is terminated or revoked sooner.  Performed at [facility], [place] [address] [place], [place] [address]          Radiology Studies: CT ABDOMEN PELVIS W CONTRAST  Result Date: 02/27/2022 CLINICAL DATA:  Acute abdominal pain. Left upper quadrant mass, spleen?. Radiologic records indicates history of polycythemia vera. EXAM: CT ABDOMEN AND PELVIS WITH CONTRAST TECHNIQUE: Multidetector CT imaging of the abdomen and pelvis was performed using the standard protocol following bolus administration of intravenous contrast. RADIATION DOSE REDUCTION: This exam was performed according to the departmental dose-optimization program which includes automated  exposure control, adjustment of the mA and/or kV according to patient size and/or use of iterative reconstruction technique. CONTRAST:  33m OMNIPAQUE IOHEXOL 350 MG/ML SOLN COMPARISON:  Abdominopelvic CT 07/07/2020 FINDINGS: Lower chest: Assessed on concurrent chest CT, reported separately. Hepatobiliary: Suggestion of mild hepatic steatosis. Focal fatty infiltration adjacent to the falciform ligament. There is thrombus within the intrahepatic and extrahepatic portal vein. Adjacent perivascular fat stranding. No discrete focal hepatic lesion. No convincing capsular nodularity. Cholecystectomy. The common bile duct is poorly defined on  the current exam, but appears nondilated. Pancreas: Fatty atrophy. There is perivascular stranding adjacent to occluded portal and splenic veins, but no definite peripancreatic inflammation. Spleen: Massive splenomegaly with progression from prior exam. The spleen measures 19.3 x 7.4 x 23 cm (volume = 1700 cm^3). Wedge-shaped area of decreased attenuation within the anterior inferior aspect of the spleen typical of splenic infarct. The splenic vein is occluded. Adrenals/Urinary Tract: Normal adrenal glands. No hydronephrosis or perinephric edema. Homogeneous renal enhancement with symmetric excretion on delayed phase imaging. No renal calculi or evidence of solid lesion. Urinary bladder is physiologically distended without wall thickening. Stomach/Bowel: Detailed bowel assessment is limited in the absence of enteric contrast and presence of ascites. Partially distended stomach. No small bowel obstruction, inflammation, pneumatosis or abnormal distention. Normal appendix. There is submucosal fatty infiltration throughout the colon consistent with chronic or prior inflammation. No acute inflammatory change. Vascular/Lymphatic: There is occlusion of the splenic, intra and extrahepatic portal vein, and likely the the upper aspect of superior mesenteric vein. Few venous collaterals in the upper abdomen. Normal caliber abdominal aorta no suspicious adenopathy. Reproductive: Status post hysterectomy. No adnexal masses. Other: Small volume of abdominopelvic ascites, measuring simple fluid density. No free air. Tiny fat containing umbilical hernia. 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 No acute osseous findings. IMPRESSION: 1. Massive splenomegaly with progression from prior exam. Wedge-shaped area of decreased attenuation within the anterior inferior aspect of the spleen typical of splenic infarct. 2. Occlusion of the splenic, intra and  extrahepatic portal veins, and likely the upper aspect of superior mesenteric vein. 3. Small volume of abdominopelvic ascites. 4. Mild hepatic steatosis. 5. Submucosal fatty infiltration throughout the colon consistent with chronic or prior inflammation. No acute inflammatory change. 6. 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 These results were called by telephone at the time of interpretation on 02/27/2022 at 5:36 pm to [facility] who verbally acknowledged these results. Electronically Signed   By: MKeith RakeM.D.   On: 02/27/2022 17:36   CT Angio Chest PE W and/or Wo Contrast  Result Date: 02/27/2022 CLINICAL DATA:  Pulmonary embolism (PE) suspected, positive D-dimer Left upper quadrant abdominal pain. EXAM: CT ANGIOGRAPHY CHEST WITH CONTRAST TECHNIQUE: Multidetector CT imaging of the chest was performed using the standard protocol during bolus administration of intravenous contrast. Multiplanar CT image reconstructions and MIPs were obtained to evaluate the vascular anatomy. Performed in conjunction with CT of the abdomen and pelvis, reported separately. RADIATION DOSE REDUCTION: This exam was performed according to the departmental dose-optimization program which includes automated exposure control, adjustment of the mA and/or kV according to patient size and/or use of iterative reconstruction technique. CONTRAST:  75 mL OMNIPAQUE IOHEXOL 350 MG/ML SOLN COMPARISON:  Most recent chest imaging: Radiograph 12/06/2019. FINDINGS: Cardiovascular: There are no filling defects within the pulmonary arteries to suggest pulmonary embolus. The thoracic aorta is  normal in caliber without acute findings. Normal heart size. No pericardial effusion. Mediastinum/Nodes: No mediastinal adenopathy or mass. No enlarged supraclavicular, axillary, or hilar lymph nodes. No esophageal wall thickening. No visible thyroid nodule. Lungs/Pleura: Trace  bilateral pleural effusions. Subsegmental atelectasis in the lingula. No pneumonia or confluent airspace disease. No features of pulmonary edema. No endobronchial lesion. No pulmonary mass. Upper Abdomen: Assessed on concurrent abdominopelvic CT, reported separately. Musculoskeletal: The right second and third ribs are partially fused, may be sequela of remote injury or congenital. There are no acute or suspicious osseous abnormalities. No chest wall soft tissue abnormalities. Review of the MIP images confirms the above findings. IMPRESSION: 1. No pulmonary embolus. 2. Trace bilateral pleural effusions. Subsegmental atelectasis in the lingula. Electronically Signed   By: Keith Rake M.D.   On: 02/27/2022 17:26        Scheduled Meds:  gabapentin  100 mg Oral QHS   Continuous Infusions:  heparin 1,700 Units/hr (02/28/22 0602)     LOS: 1 day   Time spent= 35 mins    Kashayla Ungerer Arsenio Loader, MD [facility]  If 7PM-7AM, please contact night-coverage  02/28/2022, 9:50 AM

## 2022-02-28 NOTE — Progress Notes (Signed)
ANTICOAGULATION CONSULT NOTE  Pharmacy Consult for Heparin Indication: portal vein thrombosis, splenic vein thrombosis, splenic infarct  No Known Allergies  Patient Measurements: Height: '5\' 3"'$  (160 cm) Weight: 103.8 kg (228 lb 13.4 oz) IBW/kg (Calculated) : 52.4 Heparin Dosing Weight: 77 kg  Vital Signs: Temp: 99.3 F (37.4 C) (09/21 1949) Temp Source: Oral (09/21 1949) BP: 136/75 (09/21 1949) Pulse Rate: 110 (09/21 1949)  Labs: Recent Labs    02/27/22 1530 02/27/22 1550 02/28/22 0110 02/28/22 0852 02/28/22 1957  HGB 11.1*  --  9.5*  --   --   HCT 37.8  --  32.5*  --   --   PLT 428*  --  305  --   --   HEPARINUNFRC  --   --  <0.10* <0.10* <0.10*  CREATININE 0.78  --  0.74  --   --   TROPONINIHS  --  <2  --   --   --      Estimated Creatinine Clearance: 97 mL/min (by C-G formula based on SCr of 0.74 mg/dL).   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 Heparin per pharmacy consult placed for  splenic infarct, thrombosis.  Heparin level undetectable on infusion at 1850 units/hr. No issues with line or bleeding reported per RN.  Goal of Therapy:  Heparin level 0.3-0.7 units/ml Monitor platelets by anticoagulation protocol: Yes   Plan:  Rebolus heparin 2000 units Increase heparin infusion to 2150 units/hr F/u heparin level in 6 hours Plan to DOAC once on normal diet  Thank you for allowing pharmacy to be a part of this patient's care.  Sherlon Handing, PharmD, BCPS Please see amion for complete clinical pharmacist phone list 02/28/2022 8:49 PM

## 2022-02-28 NOTE — Progress Notes (Signed)
Mobility Specialist - Progress Note   02/28/22 0922  Mobility  Activity Ambulated with assistance in hallway  Level of Assistance Standby assist, set-up cues, supervision of patient - no hands on  Assistive Device Other (Comment) (IV Pole)  Distance Ambulated (ft) 180 ft  Activity Response Tolerated well  $Mobility charge 1 Mobility    Pt received in bed agreeable to mobility. No physical assistance needed, no complaints throughout. Left in bed w/ call bell in reach and all needs met.   Paulla Dolly Mobility Specialist

## 2022-03-01 ENCOUNTER — Telehealth (HOSPITAL_COMMUNITY): Payer: Self-pay

## 2022-03-01 ENCOUNTER — Other Ambulatory Visit (HOSPITAL_COMMUNITY): Payer: Self-pay

## 2022-03-01 DIAGNOSIS — D735 Infarction of spleen: Secondary | ICD-10-CM | POA: Diagnosis not present

## 2022-03-01 LAB — HEPARIN LEVEL (UNFRACTIONATED)
Heparin Unfractionated: <0.1 IU/mL — ABNORMAL LOW (ref 0.30–0.70)
Heparin Unfractionated: 0.17 IU/mL — ABNORMAL LOW (ref 0.30–0.70)

## 2022-03-01 LAB — BASIC METABOLIC PANEL
Anion gap: 9 (ref 5–15)
BUN: 7 mg/dL (ref 6–20)
CO2: 18 mmol/L — ABNORMAL LOW (ref 22–32)
Calcium: 8.1 mg/dL — ABNORMAL LOW (ref 8.9–10.3)
Chloride: 108 mmol/L (ref 98–111)
Creatinine, Ser: 0.79 mg/dL (ref 0.44–1.00)
GFR, Estimated: >60 mL/min (ref >60)
Glucose, Bld: 105 mg/dL — ABNORMAL HIGH (ref 70–99)
Potassium: 3.8 mmol/L (ref 3.5–5.1)
Sodium: 135 mmol/L (ref 135–145)

## 2022-03-01 LAB — CBC
HCT: 32.6 % — ABNORMAL LOW (ref 36.0–46.0)
Hemoglobin: 9.8 g/dL — ABNORMAL LOW (ref 12.0–15.0)
MCH: 21.7 pg — ABNORMAL LOW (ref 26.0–34.0)
MCHC: 30.1 g/dL (ref 30.0–36.0)
MCV: 72.3 fL — ABNORMAL LOW (ref 80.0–100.0)
Platelets: 325 10*3/uL (ref 150–400)
RBC: 4.51 MIL/uL (ref 3.87–5.11)
RDW: 22.3 % — ABNORMAL HIGH (ref 11.5–15.5)
WBC: 9.2 10*3/uL (ref 4.0–10.5)
nRBC: 0 % (ref 0.0–0.2)

## 2022-03-01 LAB — MAGNESIUM: Magnesium: 1.9 mg/dL (ref 1.7–2.4)

## 2022-03-01 MED ORDER — HEPARIN BOLUS VIA INFUSION
2300.0000 [IU] | Freq: Once | INTRAVENOUS | Status: AC
  Administered 2022-03-01: 2300 [IU] via INTRAVENOUS
  Filled 2022-03-01: qty 2300

## 2022-03-01 MED ORDER — DEXTROSE-NACL 5-0.45 % IV SOLN: INTRAVENOUS | Status: AC

## 2022-03-01 NOTE — Progress Notes (Signed)
ANTICOAGULATION CONSULT NOTE  Pharmacy Consult for Heparin Indication: portal vein thrombosis, splenic vein thrombosis, splenic infarct  No Known Allergies  Patient Measurements: Height: '5\' 3"'$  (160 cm) Weight: 103.8 kg (228 lb 13.4 oz) IBW/kg (Calculated) : 52.4 Heparin Dosing Weight: 77 kg  Vital Signs: Temp: 98.5 F (36.9 C) (09/22 0425) Temp Source: Oral (09/22 0425) BP: 127/67 (09/22 0425) Pulse Rate: 108 (09/22 0425)  Labs: Recent Labs    02/27/22 1530 02/27/22 1550 02/28/22 0110 02/28/22 0110 02/28/22 0852 02/28/22 1957 03/01/22 0656  HGB 11.1*  --  9.5*  --   --   --   --   HCT 37.8  --  32.5*  --   --   --   --   PLT 428*  --  305  --   --   --   --   HEPARINUNFRC  --   --  <0.10*   < > <0.10* <0.10* <0.10*  CREATININE 0.78  --  0.74  --   --   --  0.79  TROPONINIHS  --  <2  --   --   --   --   --    < > = values in this interval not displayed.     Estimated Creatinine Clearance: 97 mL/min (by C-G formula based on SCr of 0.79 mg/dL).   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 Heparin per pharmacy consult placed for  splenic infarct, thrombosis.  Heparin level undetectable on infusion at 2150 units/hr. Level drawn appropriately. No issues with access or infusion. No bleeding noted. No CBC this AM.   Goal of Therapy:  Heparin level 0.3-0.7 units/ml Monitor platelets by anticoagulation protocol: Yes   Plan:  Rebolus heparin 2300 units Increase heparin infusion to 2400 units/hr F/u heparin level in 6 hours Check CBC with next heparin level at 1500 Monitor heparin level, CBC and s/s of bleeding daily  Plan to DOAC once on normal diet  Thank you for allowing pharmacy to be a part of this patient's care.  Cristela Felt, PharmD, BCPS Clinical Pharmacist 03/01/2022 8:08 AM

## 2022-03-01 NOTE — TOC Benefit Eligibility Note (Signed)
Patient Research scientist (life sciences) completed.     The patient is currently admitted and upon discharge could be taking Eliquis.   The current 30 day co-pay is, $40.   The patient is insured through [place] [employer].

## 2022-03-01 NOTE — Telephone Encounter (Signed)
Pharmacy Patient Advocate Encounter  Insurance verification completed.    The patient is insured through [employer]   The patient is currently admitted and ran test claims for the following: Eliquis.  Copays and coinsurance results were relayed to Inpatient clinical team.  

## 2022-03-01 NOTE — Progress Notes (Addendum)
  Progress Note    03/01/2022 7:42 AM * No surgery found *  Subjective:  Still experiencing abdominal pain    Vitals:   02/28/22 1949 03/01/22 0425  BP: 136/75 127/67  Pulse: (!) 110 (!) 108  Resp: 16 16  Temp: 99.3 F (37.4 C) 98.5 F (36.9 C)  SpO2: 95% 95%    Physical Exam: Lungs:  nonlabored Extremities:  moving freely Abdomen:  soft, ND, tender to LUQ  CBC    Component Value Date/Time   WBC 8.4 02/28/2022 0110   RBC 4.49 02/28/2022 0110   HGB 9.5 (L) 02/28/2022 0110   HGB 12.0 02/06/2022 1440   HGB 13.4 05/21/2017 1519   HCT 32.5 (L) 02/28/2022 0110   HCT 46.2 05/21/2017 1519   PLT 305 02/28/2022 0110   PLT 298 02/06/2022 1440   PLT 849 (H) 05/21/2017 1519   MCV 72.4 (L) 02/28/2022 0110   MCV 65.7 (L) 05/21/2017 1519   MCH 21.2 (L) 02/28/2022 0110   MCHC 29.2 (L) 02/28/2022 0110   RDW 22.5 (H) 02/28/2022 0110   RDW 21.5 (H) 05/21/2017 1519   LYMPHSABS 1.2 02/28/2022 0110   LYMPHSABS 2.2 05/21/2017 1519   MONOABS 0.6 02/28/2022 0110   MONOABS 0.6 05/21/2017 1519   EOSABS 0.1 02/28/2022 0110   EOSABS 0.9 (H) 05/21/2017 1519   BASOSABS 0.2 (H) 02/28/2022 0110   BASOSABS 0.5 (H) 05/21/2017 1519    BMET    Component Value Date/Time   NA 135 02/28/2022 0110   NA 140 02/20/2016 1432   K 3.2 (L) 02/28/2022 0110   K 4.1 02/20/2016 1432   CL 108 02/28/2022 0110   CO2 16 (L) 02/28/2022 0110   CO2 25 02/20/2016 1432   GLUCOSE 112 (H) 02/28/2022 0110   GLUCOSE 154 (H) 02/20/2016 1432   BUN 7 02/28/2022 0110   BUN 11.1 02/20/2016 1432   CREATININE 0.74 02/28/2022 0110   CREATININE 1.0 02/20/2016 1432   CALCIUM 8.0 (L) 02/28/2022 0110   CALCIUM 9.8 02/20/2016 1432   GFRNONAA >60 02/28/2022 0110   GFRAA >60 12/06/2019 1239    INR    Component Value Date/Time   INR 1.4 (H) 07/11/2020 0518     Intake/Output Summary (Last 24 hours) at 03/01/2022 0742 Last data filed at 03/01/2022 0631 Gross per 24 hour  Intake 2031.08 ml  Output --  Net  2031.08 ml     Assessment/Plan:  51 y.o. female with portal vein thrombosis, splenic vein thrombosis, and splenic infarct   -Still experiencing abdominal pain, mostly in LUQ. Well controlled with current pain meds -Abdomen is soft and nondistended -Multiple loose stools overnight -Recommend to keep NPO until patient is pain free -Continue heparin drip  Vicente Serene, PA-C [facility] [phone] 03/01/2022 7:42 AM  VASCULAR STAFF ADDENDUM: I have independently interviewed and examined the patient. I agree with the above.  OK for sips of clears. Please call for any questions over the weekend.  Yevonne Aline. Stanford Breed, MD [facility] Phone Number: [phone] 03/01/2022 3:38 PM

## 2022-03-01 NOTE — Progress Notes (Signed)
PROGRESS NOTE    Eileen [name]  PNT:[id number] DOB: [birth year] DOA: 02/27/2022 PCP: [name], MD   Brief Narrative:   51 y.o. 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  angio showed massive splenomegaly and splenic infarct.  PE study was negative.     Assessment & Plan:  Principal Problem:   Splenic infarct   Splenic infarct, seen on CT scan, unclear etiology, POA. 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  Still having abdominal pain, continue NPO.  IV heparin.  Seen by vascular.   Polycythemia vera on hydroxyurea and Jakafi Microcytic anemia; baseline Hb 12.0 Follows with outpatient Dr. [name].  On Jakafi outpatient -Hemoglobin has drifted down to 9.8.  Anemia is related to her previous phlebotomy.  Her last phlebotomy was at least 2-3 months ago. Briefly discussed with Dr. [name] on 9/21.  Okay to transition to Eliquis when able   SIRS No obvious evidence of infection.  COVID-19 negative.  Monitor fever curve  Hypokalemia - Repletion   Elevated BP, may be related to uncontrolled pain Pain control.  As needed medications ordered   Peripheral neuropathy on low-dose gabapentin Resume home gabapentin   Severe morbid obesity BMI 40 Recommend weight loss outpatient with regular physical activity and healthy dieting.   DVT prophylaxis: Hep Drip Code Status: Full Code Family Communication: Mother  Status is: Inpatient Maintain hospital stay until abdominal symptoms have improved.    Subjective: Tells me she still having abdominal pain with poor oral tolerance  Examination:  Constitutional: Not in acute distress Respiratory: Clear to auscultation bilaterally Cardiovascular: Normal sinus  rhythm, no rubs Abdomen: Tender to deep palpation especially left upper quadrant Musculoskeletal: No edema noted Skin: No rashes seen Neurologic: CN 2-12 grossly intact.  And nonfocal Psychiatric: Normal judgment and insight. Alert and oriented x 3. Normal mood.  Objective: Vitals:   02/28/22 1544 02/28/22 1949 03/01/22 0425 03/01/22 0903  BP: (!) 140/71 136/75 127/67 130/82  Pulse: (!) 103 (!) 110 (!) 108 (!) 112  Resp: '17 16 16 17  '$ Temp: 98.9 F (37.2 C) 99.3 F (37.4 C) 98.5 F (36.9 C) 98.9 F (37.2 C)  TempSrc: Oral Oral Oral Oral  SpO2: 95% 95% 95% 97%  Weight:      Height:        Intake/Output Summary (Last 24 hours) at 03/01/2022 1348 Last data filed at 03/01/2022 1300 Gross per 24 hour  Intake 2031.08 ml  Output --  Net 2031.08 ml   Filed Weights   02/27/22 1525  Weight: 103.8 kg     Data Reviewed:   CBC: Recent Labs  Lab 02/27/22 1530 02/28/22 0110  WBC 9.7 8.4  NEUTROABS  --  6.3  HGB 11.1* 9.5*  HCT 37.8 32.5*  MCV 72.8* 72.4*  PLT 428* 086   Basic Metabolic Panel: Recent Labs  Lab 02/27/22 1530 02/28/22 0110 03/01/22 0656  NA 137 135 135  K 3.6 3.2* 3.8  CL 103 108 108  CO2 22 16* 18*  GLUCOSE 112* 112* 105*  BUN '12 7 7  '$ CREATININE 0.78 0.74 0.79  CALCIUM 9.2 8.0* 8.1*  MG  --  1.9 1.9  PHOS  --  3.1  --    GFR: Estimated Creatinine Clearance: 97 mL/min (by C-G formula based  on SCr of 0.79 mg/dL). Liver Function Tests: Recent Labs  Lab 02/27/22 1530 02/28/22 0110  AST 10* 11*  ALT <5 9  ALKPHOS 93 82  BILITOT 0.7 0.9  PROT 7.6 5.9*  ALBUMIN 4.0 2.7*   Recent Labs  Lab 02/27/22 1530  LIPASE <10*   No results for input(s): "AMMONIA" in the last 168 hours. Coagulation Profile: No results for input(s): "INR", "PROTIME" in the last 168 hours. Cardiac Enzymes: No results for input(s): "CKTOTAL", "CKMB", "CKMBINDEX", "TROPONINI" in the last 168 hours. BNP (last 3 results) No results for input(s): "PROBNP" in the last  8760 hours. HbA1C: No results for input(s): "HGBA1C" in the last 72 hours. CBG: No results for input(s): "GLUCAP" in the last 168 hours. Lipid Profile: No results for input(s): "CHOL", "HDL", "LDLCALC", "TRIG", "CHOLHDL", "LDLDIRECT" in the last 72 hours. Thyroid Function Tests: No results for input(s): "TSH", "T4TOTAL", "FREET4", "T3FREE", "THYROIDAB" in the last 72 hours. Anemia Panel: No results for input(s): "VITAMINB12", "FOLATE", "FERRITIN", "TIBC", "IRON", "RETICCTPCT" in the last 72 hours. Sepsis Labs: No results for input(s): "PROCALCITON", "LATICACIDVEN" in the last 168 hours.  Recent Results (from the past 240 hour(s))  SARS Coronavirus 2 by RT PCR (hospital order, performed in [facility] hospital lab) *cepheid single result test* Anterior Nasal Swab     Status: None   Collection Time: 02/27/22 11:33 PM   Specimen: Anterior Nasal Swab  Result Value Ref Range Status   SARS Coronavirus 2 by RT PCR NEGATIVE NEGATIVE Final    Comment: (NOTE) SARS-CoV-2 target nucleic acids are NOT DETECTED.  The SARS-CoV-2 RNA is generally detectable in upper and lower respiratory specimens during the acute phase of infection. The lowest concentration of SARS-CoV-2 viral copies this assay can detect is 250 copies / mL. A negative result does not preclude SARS-CoV-2 infection and should not be used as the sole basis for treatment or other patient management decisions.  A negative result may occur with improper specimen collection / handling, submission of specimen other than nasopharyngeal swab, presence of viral mutation(s) within the areas targeted by this assay, and inadequate number of viral copies (<250 copies / mL). A negative result must be combined with clinical observations, patient history, and epidemiological information.  Fact Sheet for Patients:   https://www.patel.info/  Fact Sheet for Healthcare Providers: https://hall.com/  This  test is not yet approved or  cleared by the [place] FDA and has been authorized for detection and/or diagnosis of SARS-CoV-2 by FDA under an Emergency Use Authorization (EUA).  This EUA will remain in effect (meaning this test can be used) for the duration of the COVID-19 declaration under Section 564(b)(1) of the Act, 21 U.S.C. section 360bbb-3(b)(1), unless the authorization is terminated or revoked sooner.  Performed at [facility], [place] [address] [place], [place] [address]   Culture, blood (Routine X 2) w Reflex to ID Panel     Status: None (Preliminary result)   Collection Time: 02/28/22  1:10 AM   Specimen: BLOOD  Result Value Ref Range Status   Specimen Description BLOOD RIGHT ANTECUBITAL  Final   Special Requests   Final    BOTTLES DRAWN AEROBIC AND ANAEROBIC Blood Culture adequate volume   Culture   Final    NO GROWTH 1 DAY Performed at [facility], [place] [address], [place], [place] [address]    Report Status PENDING  Incomplete  Culture, blood (Routine X 2) w Reflex to ID Panel     Status:  None (Preliminary result)   Collection Time: 02/28/22  1:10 AM   Specimen: BLOOD LEFT HAND  Result Value Ref Range Status   Specimen Description BLOOD LEFT HAND  Final   Special Requests   Final    BOTTLES DRAWN AEROBIC AND ANAEROBIC Blood Culture adequate volume   Culture   Final    NO GROWTH 1 DAY Performed at [facility], [address] [place], [place] [address]    Report Status PENDING  Incomplete         Radiology Studies: CT ABDOMEN PELVIS W CONTRAST  Result Date: 02/27/2022 CLINICAL DATA:  Acute abdominal pain. Left upper quadrant mass, spleen?. Radiologic records indicates history of polycythemia vera. EXAM: CT ABDOMEN AND PELVIS WITH CONTRAST TECHNIQUE: Multidetector CT imaging of the abdomen and pelvis was performed using the standard protocol following bolus administration of intravenous contrast. RADIATION DOSE REDUCTION: This exam was  performed according to the departmental dose-optimization program which includes automated exposure control, adjustment of the mA and/or kV according to patient size and/or use of iterative reconstruction technique. CONTRAST:  32m OMNIPAQUE IOHEXOL 350 MG/ML SOLN COMPARISON:  Abdominopelvic CT 07/07/2020 FINDINGS: Lower chest: Assessed on concurrent chest CT, reported separately. Hepatobiliary: Suggestion of mild hepatic steatosis. Focal fatty infiltration adjacent to the falciform ligament. There is thrombus within the intrahepatic and extrahepatic portal vein. Adjacent perivascular fat stranding. No discrete focal hepatic lesion. No convincing capsular nodularity. Cholecystectomy. The common bile duct is poorly defined on the current exam, but appears nondilated. Pancreas: Fatty atrophy. There is perivascular stranding adjacent to occluded portal and splenic veins, but no definite peripancreatic inflammation. Spleen: Massive splenomegaly with progression from prior exam. The spleen measures 19.3 x 7.4 x 23 cm (volume = 1700 cm^3). Wedge-shaped area of decreased attenuation within the anterior inferior aspect of the spleen typical of splenic infarct. The splenic vein is occluded. Adrenals/Urinary Tract: Normal adrenal glands. No hydronephrosis or perinephric edema. Homogeneous renal enhancement with symmetric excretion on delayed phase imaging. No renal calculi or evidence of solid lesion. Urinary bladder is physiologically distended without wall thickening. Stomach/Bowel: Detailed bowel assessment is limited in the absence of enteric contrast and presence of ascites. Partially distended stomach. No small bowel obstruction, inflammation, pneumatosis or abnormal distention. Normal appendix. There is submucosal fatty infiltration throughout the colon consistent with chronic or prior inflammation. No acute inflammatory change. Vascular/Lymphatic: There is occlusion of the splenic, intra and extrahepatic portal vein,  and likely the the upper aspect of superior mesenteric vein. Few venous collaterals in the upper abdomen. Normal caliber abdominal aorta no suspicious adenopathy. Reproductive: Status post hysterectomy. No adnexal masses. Other: Small volume of abdominopelvic ascites, measuring simple fluid density. No free air. Tiny fat containing umbilical hernia. 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 No acute osseous findings. IMPRESSION: 1. Massive splenomegaly with progression from prior exam. Wedge-shaped area of decreased attenuation within the anterior inferior aspect of the spleen typical of splenic infarct. 2. Occlusion of the splenic, intra and extrahepatic portal veins, and likely the upper aspect of superior mesenteric vein. 3. Small volume of abdominopelvic ascites. 4. Mild hepatic steatosis. 5. Submucosal fatty infiltration throughout the colon consistent with chronic or prior inflammation. No acute inflammatory change. 6. 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 These results were called by telephone at the time of interpretation on 02/27/2022 at 5:36 pm to provider [facility], who  verbally acknowledged these results. Electronically Signed   By: Keith Rake M.D.   On: 02/27/2022 17:36   CT Angio Chest PE W and/or Wo Contrast  Result Date: 02/27/2022 CLINICAL DATA:  Pulmonary embolism (PE) suspected, positive D-dimer Left upper quadrant abdominal pain. EXAM: CT ANGIOGRAPHY CHEST WITH CONTRAST TECHNIQUE: Multidetector CT imaging of the chest was performed using the standard protocol during bolus administration of intravenous contrast. Multiplanar CT image reconstructions and MIPs were obtained to evaluate the vascular anatomy. Performed in conjunction with CT of the abdomen and pelvis, reported separately. RADIATION DOSE REDUCTION: This exam was performed according to  the departmental dose-optimization program which includes automated exposure control, adjustment of the mA and/or kV according to patient size and/or use of iterative reconstruction technique. CONTRAST:  75 mL OMNIPAQUE IOHEXOL 350 MG/ML SOLN COMPARISON:  Most recent chest imaging: Radiograph 12/06/2019. FINDINGS: Cardiovascular: There are no filling defects within the pulmonary arteries to suggest pulmonary embolus. The thoracic aorta is normal in caliber without acute findings. Normal heart size. No pericardial effusion. Mediastinum/Nodes: No mediastinal adenopathy or mass. No enlarged supraclavicular, axillary, or hilar lymph nodes. No esophageal wall thickening. No visible thyroid nodule. Lungs/Pleura: Trace bilateral pleural effusions. Subsegmental atelectasis in the lingula. No pneumonia or confluent airspace disease. No features of pulmonary edema. No endobronchial lesion. No pulmonary mass. Upper Abdomen: Assessed on concurrent abdominopelvic CT, reported separately. Musculoskeletal: The right second and third ribs are partially fused, may be sequela of remote injury or congenital. There are no acute or suspicious osseous abnormalities. No chest wall soft tissue abnormalities. Review of the MIP images confirms the above findings. IMPRESSION: 1. No pulmonary embolus. 2. Trace bilateral pleural effusions. Subsegmental atelectasis in the lingula. Electronically Signed   By: Keith Rake M.D.   On: 02/27/2022 17:26        Scheduled Meds:  gabapentin  100 mg Oral QHS   Continuous Infusions:  dextrose 5 % and 0.45% NaCl 75 mL/hr at 03/01/22 1015   heparin 2,400 Units/hr (03/01/22 0842)     LOS: 2 days   Time spent= 35 mins    Kechia Yahnke Arsenio Loader, MD [facility]  If 7PM-7AM, please contact night-coverage  03/01/2022, 1:48 PM

## 2022-03-01 NOTE — Progress Notes (Signed)
ANTICOAGULATION CONSULT NOTE  Pharmacy Consult for Heparin Indication: portal vein thrombosis, splenic vein thrombosis, splenic infarct  No Known Allergies  Patient Measurements: Height: '5\' 3"'$  (160 cm) Weight: 103.8 kg (228 lb 13.4 oz) IBW/kg (Calculated) : 52.4 Heparin Dosing Weight: 77 kg  Vital Signs: Temp: 98.9 F (37.2 C) (09/22 0903) Temp Source: Oral (09/22 0903) BP: 130/82 (09/22 0903) Pulse Rate: 112 (09/22 0903)  Labs: Recent Labs    02/27/22 1530 02/27/22 1550 02/28/22 0110 02/28/22 0852 02/28/22 1957 03/01/22 0656 03/01/22 1502  HGB 11.1*  --  9.5*  --   --   --   --   HCT 37.8  --  32.5*  --   --   --   --   PLT 428*  --  305  --   --   --   --   HEPARINUNFRC  --   --  <0.10*   < > <0.10* <0.10* 0.17*  CREATININE 0.78  --  0.74  --   --  0.79  --   TROPONINIHS  --  <2  --   --   --   --   --    < > = values in this interval not displayed.    Estimated Creatinine Clearance: 97 mL/min (by C-G formula based on SCr of 0.79 mg/dL).  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 Heparin per pharmacy consult placed for splenic infarct, thrombosis.  Heparin level 0.17 units/mL which is subtherapeutic on heparin 2400 units/hr. No issues with administration. CBC is stable and WNL.  Goal of Therapy:  Heparin level 0.3-0.7 units/ml Monitor platelets by anticoagulation protocol: Yes   Plan:  Rebolus heparin 2300 units Increase heparin infusion to 2600 units/hr F/u heparin level in 6 hours Monitor heparin level, CBC and s/s of bleeding daily  Plan for DOAC once on normal diet  Thank you for allowing pharmacy to be a part of this patient's care.  Erskine Speed, PharmD Clinical Pharmacist 03/01/2022 4:45 PM

## 2022-03-01 NOTE — TOC CM/SW Note (Addendum)
  Transition of Care [facility]) Screening Note   Patient Details  Name: Eileen [name] [facility] Date of Birth: [birth year]  Patient aware co pay for Eliquis $40.00 month .  Provided 30 day free card for Eliquis and $10 co pay card to patient and explained. Patient voiced understanding.     Transition of Care Department [facility]) has reviewed patient and no TOC needs have been identified at this time. We will continue to monitor patient advancement through interdisciplinary progression rounds. If new patient transition needs arise, please place a TOC consult.

## 2022-03-01 NOTE — Progress Notes (Signed)
Mobility Specialist - Progress Note   03/01/22 0957  Mobility  Activity Ambulated with assistance in hallway  Level of Assistance Standby assist, set-up cues, supervision of patient - no hands on  Assistive Device Other (Comment) (IV Pole)  Distance Ambulated (ft) 180 ft  Activity Response Tolerated well  $Mobility charge 1 Mobility    Pt received in bed agreeable to mobility. Left in bed w/ call bell in reach and all needs met.   Paulla Dolly Mobility Specialist

## 2022-03-01 NOTE — Progress Notes (Signed)
Mobility Specialist - Progress Note   03/01/22 1615  Mobility  Activity Transferred from bed to chair  Level of Assistance Independent  Assistive Device None  Distance Ambulated (ft) 2 ft  Activity Response Tolerated well  $Mobility charge 1 Mobility    Pt independently transferred from bed to recliner, no physical assistance required.   Paulla Dolly Mobility Specialist

## 2022-03-02 DIAGNOSIS — D735 Infarction of spleen: Secondary | ICD-10-CM | POA: Diagnosis not present

## 2022-03-02 LAB — CBC
HCT: 33.1 % — ABNORMAL LOW (ref 36.0–46.0)
Hemoglobin: 9.6 g/dL — ABNORMAL LOW (ref 12.0–15.0)
MCH: 21.2 pg — ABNORMAL LOW (ref 26.0–34.0)
MCHC: 29 g/dL — ABNORMAL LOW (ref 30.0–36.0)
MCV: 73.2 fL — ABNORMAL LOW (ref 80.0–100.0)
Platelets: 312 10*3/uL (ref 150–400)
RBC: 4.52 MIL/uL (ref 3.87–5.11)
RDW: 22.1 % — ABNORMAL HIGH (ref 11.5–15.5)
WBC: 9.4 10*3/uL (ref 4.0–10.5)
nRBC: 0 % (ref 0.0–0.2)

## 2022-03-02 LAB — BASIC METABOLIC PANEL
Anion gap: 4 — ABNORMAL LOW (ref 5–15)
BUN: 6 mg/dL (ref 6–20)
CO2: 22 mmol/L (ref 22–32)
Calcium: 8.1 mg/dL — ABNORMAL LOW (ref 8.9–10.3)
Chloride: 105 mmol/L (ref 98–111)
Creatinine, Ser: 0.68 mg/dL (ref 0.44–1.00)
GFR, Estimated: >60 mL/min (ref >60)
Glucose, Bld: 126 mg/dL — ABNORMAL HIGH (ref 70–99)
Potassium: 3.1 mmol/L — ABNORMAL LOW (ref 3.5–5.1)
Sodium: 131 mmol/L — ABNORMAL LOW (ref 135–145)

## 2022-03-02 LAB — HEPARIN LEVEL (UNFRACTIONATED)
Heparin Unfractionated: 0.19 IU/mL — ABNORMAL LOW (ref 0.30–0.70)
Heparin Unfractionated: 0.35 IU/mL (ref 0.30–0.70)
Heparin Unfractionated: 0.34 IU/mL (ref 0.30–0.70)

## 2022-03-02 LAB — MAGNESIUM: Magnesium: 1.8 mg/dL (ref 1.7–2.4)

## 2022-03-02 MED ORDER — POTASSIUM CHLORIDE 10 MEQ/100ML IV SOLN
10.0000 meq | INTRAVENOUS | Status: AC
  Administered 2022-03-02 (×5): 10 meq via INTRAVENOUS
  Filled 2022-03-02 (×5): qty 100

## 2022-03-02 MED ORDER — HEPARIN BOLUS VIA INFUSION
2000.0000 [IU] | Freq: Once | INTRAVENOUS | Status: AC
  Administered 2022-03-02: 2000 [IU] via INTRAVENOUS
  Filled 2022-03-02: qty 2000

## 2022-03-02 NOTE — Progress Notes (Signed)
ANTICOAGULATION CONSULT NOTE - Follow Up Consult  Pharmacy Consult for Heparin Indication: portal vein thrombosis, splenic vein thrombosis, splenic infarct  No Known Allergies  Patient Measurements: Height: '5\' 3"'$  (160 cm) Weight: 103.8 kg (228 lb 13.4 oz) IBW/kg (Calculated) : 52.4 Heparin Dosing Weight: 77 kg  Vital Signs: Temp: 99.1 F (37.3 C) (09/23 0733) Temp Source: Oral (09/23 0733) BP: 137/75 (09/23 0733) Pulse Rate: 104 (09/23 0733)  Labs: Recent Labs    02/27/22 1550 02/28/22 0110 02/28/22 0852 03/01/22 0656 03/01/22 1502 03/02/22 0111 03/02/22 1051  HGB  --  9.5*  --   --  9.8* 9.6*  --   HCT  --  32.5*  --   --  32.6* 33.1*  --   PLT  --  305  --   --  325 312  --   HEPARINUNFRC  --  <0.10*   < > <0.10* 0.17* 0.19* 0.35  CREATININE  --  0.74  --  0.79  --  0.68  --   TROPONINIHS <2  --   --   --   --   --   --    < > = values in this interval not displayed.    Estimated Creatinine Clearance: 97 mL/min (by C-G formula based on SCr of 0.68 mg/dL).   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 Heparin per pharmacy consult placed for splenic infarct, thrombosis.  Heparin level today is therapeutic at 0.35, on 2900 units/hr. Hgb 9.6, plt 312 - stable. No line issues or signs/symptoms of bleeding noted per RN.  Goal of Therapy:  Heparin level 0.3-0.7 units/ml Monitor platelets by anticoagulation protocol: Yes   Plan:  Continue IV heparin at 2900 units/hr. Check ~6 hr heparin level.  Daily CBC, heparin level. Monitor for signs/symptoms of bleeding. Follow-up ability to  transition to oral anticoagulation.   Vance Peper, PharmD PGY-2 Pharmacy Resident Phone [phone] 03/02/2022 12:24 PM   Please check AMION for all [place] phone numbers After 10:00 PM, call [place] [phone]

## 2022-03-02 NOTE — Progress Notes (Signed)
Mobility Specialist - Progress Note   03/02/22 1030  Mobility  Activity Ambulated with assistance in hallway  Level of Assistance Modified independent, requires aide device or extra time  Assistive Device Other (Comment) (IV Pole)  Distance Ambulated (ft) 500 ft  Activity Response Tolerated well  $Mobility charge 1 Mobility    Pt received in bed agreeable to mobility. Left in bed w/ call bell in reach and all needs met.   Paulla Dolly Mobility Specialist

## 2022-03-02 NOTE — Progress Notes (Signed)
PROGRESS NOTE    Eileen [name]  SJG:[id number] DOB: May 28, 1971 DOA: 02/27/2022 PCP: [name], MD   Brief Narrative:   51 y.o. 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  angio showed massive splenomegaly and splenic infarct.  PE study was negative.     Assessment & Plan:  Principal Problem:   Splenic infarct   Splenic infarct, seen on CT scan, unclear etiology, POA. 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  Continue IV heparin.  She would like to try clear liquid diet today.   Polycythemia vera on hydroxyurea and Jakafi Microcytic anemia; baseline Hb 12.0 Follows with outpatient Dr. [name].  On Jakafi outpatient -Hemoglobin has drifted down to 9.8.  Anemia is related to her previous phlebotomy.  Her last phlebotomy was at least 2-3 months ago. Briefly discussed with Dr. [name] on 9/21.  Okay to transition to Eliquis when able   SIRS No obvious evidence of infection.  COVID-19 negative.  Monitor fever curve  Hypokalemia - Repletion   Elevated BP, may be related to uncontrolled pain Pain control.  As needed medications ordered   Peripheral neuropathy on low-dose gabapentin Resume home gabapentin   Severe morbid obesity BMI 40 Recommend weight loss outpatient with regular physical activity and healthy dieting.   DVT prophylaxis: Hep Drip Code Status: Full Code Family Communication:   Status is: Inpatient Maintain hospital stay until abdominal symptoms have improved.    Subjective: Patient would like to try clear liquid diet today therefore will advance it. Examination: Constitutional: Not in acute distress Respiratory: Clear to auscultation bilaterally Cardiovascular: Normal sinus rhythm, no  rubs Abdomen: Tenderness to deep palpation is improved. Musculoskeletal: No edema noted Skin: No rashes seen Neurologic: CN 2-12 grossly intact.  And nonfocal Psychiatric: Normal judgment and insight. Alert and oriented x 3. Normal mood.  Objective: Vitals:   03/01/22 1758 03/01/22 1945 03/02/22 0420 03/02/22 0733  BP: 124/71 136/60 127/78 137/75  Pulse: (!) 104 (!) 107 (!) 104 (!) 104  Resp: '17 18 17 16  '$ Temp: 98.5 F (36.9 C) 98.7 F (37.1 C) 99.1 F (37.3 C) 99.1 F (37.3 C)  TempSrc: Oral Oral Oral Oral  SpO2: 99% 96% 96% 96%  Weight:      Height:        Intake/Output Summary (Last 24 hours) at 03/02/2022 0854 Last data filed at 03/01/2022 1755 Gross per 24 hour  Intake 0 ml  Output --  Net 0 ml   Filed Weights   02/27/22 1525  Weight: 103.8 kg     Data Reviewed:   CBC: Recent Labs  Lab 02/27/22 1530 02/28/22 0110 03/01/22 1502 03/02/22 0111  WBC 9.7 8.4 9.2 9.4  NEUTROABS  --  6.3  --   --   HGB 11.1* 9.5* 9.8* 9.6*  HCT 37.8 32.5* 32.6* 33.1*  MCV 72.8* 72.4* 72.3* 73.2*  PLT 428* 305 325 654   Basic Metabolic Panel: Recent Labs  Lab 02/27/22 1530 02/28/22 0110 03/01/22 0656 03/02/22 0111  NA 137 135 135 131*  K 3.6 3.2* 3.8 3.1*  CL 103 108 108 105  CO2 22 16* 18* 22  GLUCOSE 112* 112* 105* 126*  BUN '12 7 7 6  '$ CREATININE 0.78 0.74 0.79 0.68  CALCIUM 9.2 8.0* 8.1* 8.1*  MG  --  1.9 1.9 1.8  PHOS  --  3.1  --   --    GFR: Estimated Creatinine Clearance: 97 mL/min (by C-G formula based on SCr of 0.68 mg/dL). Liver Function Tests: Recent Labs  Lab 02/27/22 1530 02/28/22 0110  AST 10* 11*  ALT <5 9  ALKPHOS 93 82  BILITOT 0.7 0.9  PROT 7.6 5.9*  ALBUMIN 4.0 2.7*   Recent Labs  Lab 02/27/22 1530  LIPASE <10*   No results for input(s): "AMMONIA" in the last 168 hours. Coagulation Profile: No results for input(s): "INR", "PROTIME" in the last 168 hours. Cardiac Enzymes: No results for input(s): "CKTOTAL", "CKMB", "CKMBINDEX",  "TROPONINI" in the last 168 hours. BNP (last 3 results) No results for input(s): "PROBNP" in the last 8760 hours. HbA1C: No results for input(s): "HGBA1C" in the last 72 hours. CBG: No results for input(s): "GLUCAP" in the last 168 hours. Lipid Profile: No results for input(s): "CHOL", "HDL", "LDLCALC", "TRIG", "CHOLHDL", "LDLDIRECT" in the last 72 hours. Thyroid Function Tests: No results for input(s): "TSH", "T4TOTAL", "FREET4", "T3FREE", "THYROIDAB" in the last 72 hours. Anemia Panel: No results for input(s): "VITAMINB12", "FOLATE", "FERRITIN", "TIBC", "IRON", "RETICCTPCT" in the last 72 hours. Sepsis Labs: No results for input(s): "PROCALCITON", "LATICACIDVEN" in the last 168 hours.  Recent Results (from the past 240 hour(s))  SARS Coronavirus 2 by RT PCR (hospital order, performed in [facility] hospital lab) *cepheid single result test* Anterior Nasal Swab     Status: None   Collection Time: 02/27/22 11:33 PM   Specimen: Anterior Nasal Swab  Result Value Ref Range Status   SARS Coronavirus 2 by RT PCR NEGATIVE NEGATIVE Final    Comment: (NOTE) SARS-CoV-2 target nucleic acids are NOT DETECTED.  The SARS-CoV-2 RNA is generally detectable in upper and lower respiratory specimens during the acute phase of infection. The lowest concentration of SARS-CoV-2 viral copies this assay can detect is 250 copies / mL. A negative result does not preclude SARS-CoV-2 infection and should not be used as the sole basis for treatment or other patient management decisions.  A negative result may occur with improper specimen collection / handling, submission of specimen other than nasopharyngeal swab, presence of viral mutation(s) within the areas targeted by this assay, and inadequate number of viral copies (<250 copies / mL). A negative result must be combined with clinical observations, patient history, and epidemiological information.  Fact Sheet for Patients:    https://www.patel.info/  Fact Sheet for Healthcare Providers: https://hall.com/  This test is not yet approved or  cleared by the [place] FDA and has been authorized for detection and/or diagnosis of SARS-CoV-2 by FDA under an Emergency Use Authorization (EUA).  This EUA will remain in effect (meaning this test can be used) for the duration of the COVID-19 declaration under Section 564(b)(1) of the Act, 21 U.S.C. section 360bbb-3(b)(1), unless the authorization is terminated or revoked sooner.  Performed at [facility], [place] [address] [place], [place] [address]   Culture, blood (Routine X 2) w Reflex to ID Panel     Status: None (Preliminary result)   Collection Time: 02/28/22  1:10 AM   Specimen: BLOOD  Result Value Ref Range Status   Specimen Description BLOOD RIGHT ANTECUBITAL  Final   Special Requests   Final    BOTTLES DRAWN AEROBIC AND ANAEROBIC Blood Culture adequate volume   Culture   Final    NO GROWTH 2 DAYS Performed at [facility],  [address], [place], [place] [address]    Report Status PENDING  Incomplete  Culture, blood (Routine X 2) w Reflex to ID Panel     Status: None (Preliminary result)   Collection Time: 02/28/22  1:10 AM   Specimen: BLOOD LEFT HAND  Result Value Ref Range Status   Specimen Description BLOOD LEFT HAND  Final   Special Requests   Final    BOTTLES DRAWN AEROBIC AND ANAEROBIC Blood Culture adequate volume   Culture   Final    NO GROWTH 2 DAYS Performed at [facility], [place] [address], [place], [place] [address]    Report Status PENDING  Incomplete         Radiology Studies: No results found.      Scheduled Meds:  gabapentin  100 mg Oral QHS   Continuous Infusions:  dextrose 5 % and 0.45% NaCl 75 mL/hr at 03/01/22 2154   heparin 2,900 Units/hr (03/02/22 0434)   potassium chloride       LOS: 3 days   Time spent= 35 mins    [employer] [facility] Arsenio Loader,  MD [facility]  If 7PM-7AM, please contact night-coverage  03/02/2022, 8:54 AM

## 2022-03-02 NOTE — Progress Notes (Signed)
ANTICOAGULATION CONSULT NOTE - Follow Up Consult  Pharmacy Consult for Heparin Indication: portal vein thrombosis, splenic vein thrombosis, splenic infarct  No Known Allergies  Patient Measurements: Height: '5\' 3"'$  (160 cm) Weight: 103.8 kg (228 lb 13.4 oz) IBW/kg (Calculated) : 52.4 Heparin Dosing Weight: 77 kg  Vital Signs: Temp: 98.4 F (36.9 C) (09/23 1638) Temp Source: Oral (09/23 1638) BP: 136/86 (09/23 1638) Pulse Rate: 102 (09/23 1638)  Labs: Recent Labs    02/28/22 0110 02/28/22 0852 03/01/22 0656 03/01/22 1502 03/02/22 0111 03/02/22 1051 03/02/22 1715  HGB 9.5*  --   --  9.8* 9.6*  --   --   HCT 32.5*  --   --  32.6* 33.1*  --   --   PLT 305  --   --  325 312  --   --   HEPARINUNFRC <0.10*   < > <0.10* 0.17* 0.19* 0.35 0.34  CREATININE 0.74  --  0.79  --  0.68  --   --    < > = values in this interval not displayed.     Estimated Creatinine Clearance: 97 mL/min (by C-G formula based on SCr of 0.68 mg/dL).   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 Heparin per pharmacy consult placed for splenic infarct, thrombosis.  Heparin level remains therapeutic at 0.34, on 2900 units/hr. Hgb 9.6, plt 312 - stable. No line issues or signs/symptoms of bleeding noted per RN.  Goal of Therapy:  Heparin level 0.3-0.7 units/ml Monitor platelets by anticoagulation protocol: Yes   Plan:  Continue IV heparin at 2900 units/hr. Daily CBC, heparin level. Monitor for signs/symptoms of bleeding. Follow-up ability to transition to oral anticoagulation.  [employer], Pharm.D., BCPS Clinical Pharmacist  **Pharmacist phone  directory can be found on [email or web address] listed under [place].  03/02/2022 6:19 PM

## 2022-03-02 NOTE — Progress Notes (Signed)
ANTICOAGULATION CONSULT NOTE  Pharmacy Consult for Heparin Indication: portal vein thrombosis, splenic vein thrombosis, splenic infarct  No Known Allergies  Patient Measurements: Height: '5\' 3"'$  (160 cm) Weight: 103.8 kg (228 lb 13.4 oz) IBW/kg (Calculated) : 52.4 Heparin Dosing Weight: 77 kg  Vital Signs: Temp: 98.7 F (37.1 C) (09/22 1945) Temp Source: Oral (09/22 1945) BP: 136/60 (09/22 1945) Pulse Rate: 107 (09/22 1945)  Labs: Recent Labs    02/27/22 1550 02/28/22 0110 02/28/22 0852 03/01/22 0656 03/01/22 1502 03/02/22 0111  HGB  --  9.5*  --   --  9.8* 9.6*  HCT  --  32.5*  --   --  32.6* 33.1*  PLT  --  305  --   --  325 312  HEPARINUNFRC  --  <0.10*   < > <0.10* 0.17* 0.19*  CREATININE  --  0.74  --  0.79  --  0.68  TROPONINIHS <2  --   --   --   --   --    < > = values in this interval not displayed.    Estimated Creatinine Clearance: 97 mL/min (by C-G formula based on SCr of 0.68 mg/dL).  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 Heparin per pharmacy consult placed for splenic infarct, thrombosis.  Heparin level 0.17 units/mL which is subtherapeutic on heparin 2400 units/hr. No issues with administration. CBC is stable and WNL.  9/23 AM update:  Heparin level low but trending up  Goal of Therapy:  Heparin level 0.3-0.7 units/ml Monitor platelets by anticoagulation protocol: Yes   Plan:  Heparin 2000 units re-bolus Increase heparin infusion to 2900 units/hr F/u heparin level in 8 hours Monitor heparin level, CBC and s/s of bleeding daily  Plan for DOAC once on normal diet  Narda Bonds, PharmD, BCPS Clinical Pharmacist Phone: [phone]

## 2022-03-03 ENCOUNTER — Inpatient Hospital Stay: Payer: Self-pay

## 2022-03-03 DIAGNOSIS — D735 Infarction of spleen: Secondary | ICD-10-CM | POA: Diagnosis not present

## 2022-03-03 LAB — CBC
HCT: 31.9 % — ABNORMAL LOW (ref 36.0–46.0)
Hemoglobin: 9.5 g/dL — ABNORMAL LOW (ref 12.0–15.0)
MCH: 21.3 pg — ABNORMAL LOW (ref 26.0–34.0)
MCHC: 29.8 g/dL — ABNORMAL LOW (ref 30.0–36.0)
MCV: 71.4 fL — ABNORMAL LOW (ref 80.0–100.0)
Platelets: 336 10*3/uL (ref 150–400)
RBC: 4.47 MIL/uL (ref 3.87–5.11)
RDW: 22.5 % — ABNORMAL HIGH (ref 11.5–15.5)
WBC: 9.5 10*3/uL (ref 4.0–10.5)
nRBC: 0 % (ref 0.0–0.2)

## 2022-03-03 LAB — BASIC METABOLIC PANEL
Anion gap: 9 (ref 5–15)
BUN: <5 mg/dL — ABNORMAL LOW (ref 6–20)
CO2: 21 mmol/L — ABNORMAL LOW (ref 22–32)
Calcium: 8.3 mg/dL — ABNORMAL LOW (ref 8.9–10.3)
Chloride: 105 mmol/L (ref 98–111)
Creatinine, Ser: 0.67 mg/dL (ref 0.44–1.00)
GFR, Estimated: >60 mL/min (ref >60)
Glucose, Bld: 121 mg/dL — ABNORMAL HIGH (ref 70–99)
Potassium: 3.4 mmol/L — ABNORMAL LOW (ref 3.5–5.1)
Sodium: 135 mmol/L (ref 135–145)

## 2022-03-03 LAB — MAGNESIUM: Magnesium: 1.8 mg/dL (ref 1.7–2.4)

## 2022-03-03 LAB — HEPARIN LEVEL (UNFRACTIONATED): Heparin Unfractionated: 0.38 IU/mL (ref 0.30–0.70)

## 2022-03-03 MED ORDER — POTASSIUM CHLORIDE 10 MEQ/100ML IV SOLN
10.0000 meq | INTRAVENOUS | Status: AC
  Administered 2022-03-03 (×3): 10 meq via INTRAVENOUS
  Filled 2022-03-03 (×3): qty 100

## 2022-03-03 MED ORDER — CHLORHEXIDINE GLUCONATE CLOTH 2 % EX PADS
6.0000 | MEDICATED_PAD | Freq: Every day | CUTANEOUS | Status: DC
  Administered 2022-03-03 – 2022-03-06 (×4): 6 via TOPICAL

## 2022-03-03 MED ORDER — SODIUM CHLORIDE 0.9% FLUSH: 10.0000 mL | INTRAVENOUS | Status: DC | PRN

## 2022-03-03 MED ORDER — POTASSIUM CHLORIDE 10 MEQ/100ML IV SOLN
10.0000 meq | Freq: Once | INTRAVENOUS | Status: AC
  Administered 2022-03-03: 10 meq via INTRAVENOUS
  Filled 2022-03-03: qty 100

## 2022-03-03 MED ORDER — OXYCODONE HCL 5 MG PO TABS
10.0000 mg | ORAL_TABLET | ORAL | Status: DC | PRN
  Administered 2022-03-03 – 2022-03-06 (×12): 10 mg via ORAL
  Filled 2022-03-03 (×13): qty 2

## 2022-03-03 MED ORDER — MAGNESIUM OXIDE -MG SUPPLEMENT 400 (240 MG) MG PO TABS
800.0000 mg | ORAL_TABLET | Freq: Once | ORAL | Status: AC
  Administered 2022-03-03: 800 mg via ORAL
  Filled 2022-03-03: qty 2

## 2022-03-03 NOTE — Progress Notes (Signed)
Peripherally Inserted Central Catheter Placement  The IV Nurse has discussed with the patient and/or persons authorized to consent for the patient, the purpose of this procedure and the potential benefits and risks involved with this procedure.  The benefits include less needle sticks, lab draws from the catheter, and the patient may be discharged home with the catheter. Risks include, but not limited to, infection, bleeding, blood clot (thrombus formation), and puncture of an artery; nerve damage and irregular heartbeat and possibility to perform a PICC exchange if needed/ordered by physician.  Alternatives to this procedure were also discussed.  Bard Power PICC patient education guide, fact sheet on infection prevention and patient information card has been provided to patient /or left at bedside.    PICC Placement Documentation  PICC Double Lumen 03/03/22 Right Brachial 39 cm 1 cm (Active)  Indication for Insertion or Continuance of Line Poor Vasculature-patient has had multiple peripheral attempts or PIVs lasting less than 24 hours 03/03/22 1215  Exposed Catheter (cm) 1 cm 03/03/22 1215  Site Assessment Clean, Dry, Intact 03/03/22 1215  Lumen #1 Status Flushed;Saline locked;Blood return noted 03/03/22 1215  Lumen #2 Status Flushed;Saline locked;Blood return noted 03/03/22 1215  Dressing Type Transparent;Securing device 03/03/22 1215  Dressing Status Antimicrobial disc in place;Clean, Dry, Intact 03/03/22 1215  Safety Lock Not Applicable 16/10/96 0454  Line Care Connections checked and tightened 03/03/22 1215  Line Adjustment (NICU/IV Team Only) No 03/03/22 1215  Dressing Intervention New dressing 03/03/22 1215  Dressing Change Due 03/10/22 03/03/22 1215       Rolena Infante 03/03/2022, 12:17 PM

## 2022-03-03 NOTE — Progress Notes (Signed)
PROGRESS NOTE    Eileen [name]  NIO:[id number] DOB: 1971/04/15 DOA: 02/27/2022 PCP: [name], MD   Brief Narrative:   51 y.o. 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  angio showed massive splenomegaly and splenic infarct.  PE study was negative.     Assessment & Plan:  Principal Problem:   Splenic infarct   Splenic infarct, seen on CT scan, unclear etiology, POA. 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  Continue IV heparin.  Did not tolerate much clears yesterday.  Will increase pain meds today.  If continues to have worsening pain, she may require repeat CT scan.   Polycythemia vera on hydroxyurea and Jakafi Microcytic anemia; baseline Hb 12.0 Follows with outpatient Dr. [name].  On Jakafi outpatient -Hemoglobin has drifted down to 9.8.  Anemia is related to her previous phlebotomy.  Her last phlebotomy was at least 2-3 months ago. Briefly discussed with Dr. [name] on 9/21.  P.o. Eliquis when able to transition   SIRS No obvious evidence of infection.  COVID-19 negative.  Monitor fever curve  Hypokalemia - Repletion   Elevated BP, may be related to uncontrolled pain Pain control.  As needed medications ordered   Peripheral neuropathy on low-dose gabapentin Resume home gabapentin   Severe morbid obesity BMI 40 Recommend weight loss outpatient with regular physical activity and healthy dieting.   DVT prophylaxis: Hep Drip Code Status: Full Code Family Communication:   Status is: Inpatient Maintain hospital stay until abdominal symptoms have improved.    Subjective: Poor oral intake and increase in his left-sided abdominal pain over last 24 hours.  Denies any nausea vomiting this morning.   Try to ambulate yesterday.  Examination: Constitutional: Not in acute distress Respiratory: Clear to auscultation bilaterally Cardiovascular: Normal sinus rhythm, no rubs Abdomen: Tenderness in the left upper quadrant to deep palpation. Musculoskeletal: No edema noted Skin: No rashes seen Neurologic: CN 2-12 grossly intact.  And nonfocal Psychiatric: Normal judgment and insight. Alert and oriented x 3. Normal mood.  Objective: Vitals:   03/02/22 1638 03/02/22 2029 03/03/22 0554 03/03/22 0739  BP: 136/86 (!) 146/86 129/82 136/78  Pulse: (!) 102 (!) 104 96 (!) 101  Resp: '16 18 17 18  '$ Temp: 98.4 F (36.9 C) 98.6 F (37 C) 98.2 F (36.8 C) 98.9 F (37.2 C)  TempSrc: Oral Oral Oral Oral  SpO2: 99% 100% 97% 95%  Weight:      Height:       No intake or output data in the 24 hours ending 03/03/22 1125  Filed Weights   02/27/22 1525  Weight: 103.8 kg     Data Reviewed:   CBC: Recent Labs  Lab 02/27/22 1530 02/28/22 0110 03/01/22 1502 03/02/22 0111 03/03/22 0043  WBC 9.7 8.4 9.2 9.4 9.5  NEUTROABS  --  6.3  --   --   --   HGB 11.1* 9.5* 9.8* 9.6* 9.5*  HCT 37.8 32.5* 32.6* 33.1* 31.9*  MCV 72.8* 72.4* 72.3* 73.2* 71.4*  PLT 428* 305 325 312 818   Basic Metabolic Panel: Recent Labs  Lab 02/27/22 1530 02/28/22 0110 03/01/22 0656 03/02/22 0111 03/03/22 0043  NA 137 135 135 131* 135  K 3.6 3.2* 3.8 3.1* 3.4*  CL 103 108 108 105 105  CO2 22  16* 18* 22 21*  GLUCOSE 112* 112* 105* 126* 121*  BUN '12 7 7 6 '$ <5*  CREATININE 0.78 0.74 0.79 0.68 0.67  CALCIUM 9.2 8.0* 8.1* 8.1* 8.3*  MG  --  1.9 1.9 1.8 1.8  PHOS  --  3.1  --   --   --    GFR: Estimated Creatinine Clearance: 97 mL/min (by C-G formula based on SCr of 0.67 mg/dL). Liver Function Tests: Recent Labs  Lab 02/27/22 1530 02/28/22 0110  AST 10* 11*  ALT <5 9  ALKPHOS 93 82  BILITOT 0.7 0.9  PROT 7.6 5.9*  ALBUMIN 4.0 2.7*   Recent Labs  Lab 02/27/22 1530  LIPASE <10*   No results for  input(s): "AMMONIA" in the last 168 hours. Coagulation Profile: No results for input(s): "INR", "PROTIME" in the last 168 hours. Cardiac Enzymes: No results for input(s): "CKTOTAL", "CKMB", "CKMBINDEX", "TROPONINI" in the last 168 hours. BNP (last 3 results) No results for input(s): "PROBNP" in the last 8760 hours. HbA1C: No results for input(s): "HGBA1C" in the last 72 hours. CBG: No results for input(s): "GLUCAP" in the last 168 hours. Lipid Profile: No results for input(s): "CHOL", "HDL", "LDLCALC", "TRIG", "CHOLHDL", "LDLDIRECT" in the last 72 hours. Thyroid Function Tests: No results for input(s): "TSH", "T4TOTAL", "FREET4", "T3FREE", "THYROIDAB" in the last 72 hours. Anemia Panel: No results for input(s): "VITAMINB12", "FOLATE", "FERRITIN", "TIBC", "IRON", "RETICCTPCT" in the last 72 hours. Sepsis Labs: No results for input(s): "PROCALCITON", "LATICACIDVEN" in the last 168 hours.  Recent Results (from the past 240 hour(s))  SARS Coronavirus 2 by RT PCR (hospital order, performed in [facility] hospital lab) *cepheid single result test* Anterior Nasal Swab     Status: None   Collection Time: 02/27/22 11:33 PM   Specimen: Anterior Nasal Swab  Result Value Ref Range Status   SARS Coronavirus 2 by RT PCR NEGATIVE NEGATIVE Final    Comment: (NOTE) SARS-CoV-2 target nucleic acids are NOT DETECTED.  The SARS-CoV-2 RNA is generally detectable in upper and lower respiratory specimens during the acute phase of infection. The lowest concentration of SARS-CoV-2 viral copies this assay can detect is 250 copies / mL. A negative result does not preclude SARS-CoV-2 infection and should not be used as the sole basis for treatment or other patient management decisions.  A negative result may occur with improper specimen collection / handling, submission of specimen other than nasopharyngeal swab, presence of viral mutation(s) within the areas targeted by this assay, and inadequate number of  viral copies (<250 copies / mL). A negative result must be combined with clinical observations, patient history, and epidemiological information.  Fact Sheet for Patients:   https://www.patel.info/  Fact Sheet for Healthcare Providers: https://hall.com/  This test is not yet approved or  cleared by the [place] FDA and has been authorized for detection and/or diagnosis of SARS-CoV-2 by FDA under an Emergency Use Authorization (EUA).  This EUA will remain in effect (meaning this test can be used) for the duration of the COVID-19 declaration under Section 564(b)(1) of the Act, 21 U.S.C. section 360bbb-3(b)(1), unless the authorization is terminated or revoked sooner.  Performed at [facility], [place] [address] [place], [place] [address]   Culture, blood (Routine X 2) w Reflex to ID Panel     Status: None (Preliminary result)   Collection Time: 02/28/22  1:10 AM   Specimen: BLOOD  Result Value Ref Range Status   Specimen Description BLOOD RIGHT ANTECUBITAL  Final  Special Requests   Final    BOTTLES DRAWN AEROBIC AND ANAEROBIC Blood Culture adequate volume   Culture   Final    NO GROWTH 3 DAYS Performed at [facility], [place] [address], [place], [place] [address]    Report Status PENDING  Incomplete  Culture, blood (Routine X 2) w Reflex to ID Panel     Status: None (Preliminary result)   Collection Time: 02/28/22  1:10 AM   Specimen: BLOOD LEFT HAND  Result Value Ref Range Status   Specimen Description BLOOD LEFT HAND  Final   Special Requests   Final    BOTTLES DRAWN AEROBIC AND ANAEROBIC Blood Culture adequate volume   Culture   Final    NO GROWTH 3 DAYS Performed at [facility], [place] [address] [place], [place] [address]    Report Status PENDING  Incomplete         Radiology Studies: [place] EKG SITE RITE  Result Date: 03/03/2022 If Site Rite image not attached, placement could not be confirmed due to  current cardiac rhythm.       Scheduled Meds:  gabapentin  100 mg Oral QHS   Continuous Infusions:  dextrose 5 % and 0.45% NaCl 75 mL/hr at 03/02/22 1124   heparin 2,900 Units/hr (03/03/22 0830)   potassium chloride 10 mEq (03/03/22 1025)     LOS: 4 days   Time spent= 35 mins    Anjolaoluwa Siguenza Arsenio Loader, MD [facility]  If 7PM-7AM, please contact night-coverage  03/03/2022, 11:25 AM

## 2022-03-03 NOTE — Plan of Care (Signed)
  Problem: Clinical Measurements: Goal: Ability to maintain clinical measurements within normal limits will improve 03/03/2022 0021 by Effie Berkshire, RN Outcome: Progressing 03/03/2022 0021 by Effie Berkshire, RN Outcome: Progressing Goal: Will remain free from infection 03/03/2022 0021 by Effie Berkshire, RN Outcome: Progressing 03/03/2022 0021 by Effie Berkshire, RN Outcome: Progressing Goal: Diagnostic test results will improve 03/03/2022 0021 by Effie Berkshire, RN Outcome: Progressing 03/03/2022 0021 by Effie Berkshire, RN Outcome: Progressing Goal: Respiratory complications will improve 03/03/2022 0021 by Effie Berkshire, RN Outcome: Progressing 03/03/2022 0021 by Effie Berkshire, RN Outcome: Progressing Goal: Cardiovascular complication will be avoided 03/03/2022 0021 by Effie Berkshire, RN Outcome: Progressing 03/03/2022 0021 by Effie Berkshire, RN Outcome: Progressing   Problem: Education: Goal: Knowledge of General Education information will improve Description: Including pain rating scale, medication(s)/side effects and non-pharmacologic comfort measures 03/03/2022 0021 by Effie Berkshire, RN Outcome: Progressing 03/03/2022 0021 by Effie Berkshire, RN Outcome: Progressing   Problem: Health Behavior/Discharge Planning: Goal: Ability to manage health-related needs will improve 03/03/2022 0021 by Effie Berkshire, RN Outcome: Progressing 03/03/2022 0021 by Effie Berkshire, RN Outcome: Progressing

## 2022-03-03 NOTE — Progress Notes (Signed)
ANTICOAGULATION CONSULT NOTE - Follow Up Consult  Pharmacy Consult for Heparin Indication: portal vein thrombosis, splenic vein thrombosis, splenic infarct  No Known Allergies  Patient Measurements: Height: '5\' 3"'$  (160 cm) Weight: 103.8 kg (228 lb 13.4 oz) IBW/kg (Calculated) : 52.4 Heparin Dosing Weight: 77 kg  Vital Signs: Temp: 98.9 F (37.2 C) (09/24 0739) Temp Source: Oral (09/24 0739) BP: 136/78 (09/24 0739) Pulse Rate: 101 (09/24 0739)  Labs: Recent Labs    03/01/22 0656 03/01/22 0656 03/01/22 1502 03/02/22 0111 03/02/22 1051 03/02/22 1715 03/03/22 0043  HGB  --    < > 9.8* 9.6*  --   --  9.5*  HCT  --   --  32.6* 33.1*  --   --  31.9*  PLT  --   --  325 312  --   --  336  HEPARINUNFRC <0.10*  --  0.17* 0.19* 0.35 0.34 0.38  CREATININE 0.79  --   --  0.68  --   --  0.67   < > = values in this interval not displayed.    Estimated Creatinine Clearance: 97 mL/min (by C-G formula based on SCr of 0.67 mg/dL).   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 Heparin per pharmacy consult placed for splenic infarct, thrombosis.  Heparin level today is therapeutic at 0.38, on 2900 units/hr. Hgb 9.5, plt 336 - stable. No line issues or signs/symptoms of bleeding noted per RN.  Goal of Therapy:  Heparin level 0.3-0.7 units/ml Monitor platelets by anticoagulation protocol: Yes   Plan:  Continue IV heparin at 2900 units/hr. Daily CBC, heparin level. Monitor for signs/symptoms of bleeding. Follow-up ability to transition to oral anticoagulation.   Vance Peper, PharmD PGY-2 Pharmacy Resident Phone [phone] 03/03/2022 7:58 AM   Please  check AMION for all [place] phone numbers After 10:00 PM, call [place] [phone]

## 2022-03-04 DIAGNOSIS — D735 Infarction of spleen: Secondary | ICD-10-CM | POA: Diagnosis not present

## 2022-03-04 LAB — CBC
HCT: 31.6 % — ABNORMAL LOW (ref 36.0–46.0)
Hemoglobin: 9.3 g/dL — ABNORMAL LOW (ref 12.0–15.0)
MCH: 21.7 pg — ABNORMAL LOW (ref 26.0–34.0)
MCHC: 29.4 g/dL — ABNORMAL LOW (ref 30.0–36.0)
MCV: 73.8 fL — ABNORMAL LOW (ref 80.0–100.0)
Platelets: 311 10*3/uL (ref 150–400)
RBC: 4.28 MIL/uL (ref 3.87–5.11)
RDW: 22.5 % — ABNORMAL HIGH (ref 11.5–15.5)
WBC: 8.3 10*3/uL (ref 4.0–10.5)
nRBC: 0 % (ref 0.0–0.2)

## 2022-03-04 LAB — MAGNESIUM: Magnesium: 1.9 mg/dL (ref 1.7–2.4)

## 2022-03-04 LAB — BASIC METABOLIC PANEL
Anion gap: 10 (ref 5–15)
BUN: <5 mg/dL — ABNORMAL LOW (ref 6–20)
CO2: 21 mmol/L — ABNORMAL LOW (ref 22–32)
Calcium: 8.5 mg/dL — ABNORMAL LOW (ref 8.9–10.3)
Chloride: 105 mmol/L (ref 98–111)
Creatinine, Ser: 0.68 mg/dL (ref 0.44–1.00)
GFR, Estimated: >60 mL/min (ref >60)
Glucose, Bld: 109 mg/dL — ABNORMAL HIGH (ref 70–99)
Potassium: 3.6 mmol/L (ref 3.5–5.1)
Sodium: 136 mmol/L (ref 135–145)

## 2022-03-04 LAB — HEPARIN LEVEL (UNFRACTIONATED): Heparin Unfractionated: 0.42 IU/mL (ref 0.30–0.70)

## 2022-03-04 NOTE — Progress Notes (Signed)
PROGRESS NOTE    Eileen [name]  JWJ:[id number] DOB: 05-11-1971 DOA: 02/27/2022 PCP: [name], MD   Brief Narrative:   51 y.o. female with medical history significant for polycythemia vera on hydroxyurea and Jakafi, iron deficiency anemia on iron supplement, GERD, morbid obesity, who initially presented to [facility] ED with complaints of left upper quadrant abdominal pain for 1 month.  angio showed massive splenomegaly and splenic infarct.  PE study was negative.  She is on Heparin drip, but still having quit a bit of abdominal pain therefore her po intake is very poor. Waiting for her pain to improve.    Assessment & Plan:  Principal Problem:   Splenic infarct   Splenic infarct, seen on CT scan, unclear etiology, POA. 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  Continue IV heparin.  Abd pain slightly better today. If worsen, we may have to repeat her imaging.     Polycythemia vera on hydroxyurea and Jakafi Microcytic anemia; baseline Hb 12.0 Follows with outpatient Dr. [name].  On Jakafi outpatient -Hemoglobin has drifted down to 9.8.  Anemia is related to her previous phlebotomy.  Her last phlebotomy was at least 2-3 months ago. Briefly discussed with Dr. [name] on 9/21.  Ok with PO Eliquis when able.    SIRS No obvious evidence of infection.  COVID-19 negative.  Monitor fever curve  Hypokalemia - Repletion   Elevated BP, may be related to uncontrolled pain Pain control.  As needed medications ordered   Peripheral neuropathy on low-dose gabapentin Resume home gabapentin   Severe morbid obesity BMI 40 Recommend weight loss outpatient with regular physical activity and healthy dieting.  PICC LINE PLACED ON 9/14 DUE TO VERY POOR ACCESS.    DVT prophylaxis: Hep Drip Code Status: Full Code Family Communication: Mother at bedside.   Status is: Inpatient Maintain hospital stay until abdominal symptoms have  improved. Hopefully dc in 2-3 days.    Subjective: Abd pain Is little better today, ate 2 trays of food yesterday.   Examination: Constitutional: Not in acute distress Respiratory: Clear to auscultation bilaterally Cardiovascular: Normal sinus rhythm, no rubs Abdomen: less tenderness in LUQ. nondistended good bowel sounds Musculoskeletal: No edema noted Skin: No rashes seen Neurologic: CN 2-12 grossly intact.  And nonfocal Psychiatric: Normal judgment and insight. Alert and oriented x 3. Normal mood.    Objective: Vitals:   03/03/22 2000 03/03/22 2302 03/04/22 0413 03/04/22 0738  BP: 121/74  126/85 115/71  Pulse: 100  95 88  Resp: '17 16 16 17  '$ Temp: 98.8 F (37.1 C)  98.5 F (36.9 C) 98.2 F (36.8 C)  TempSrc: Oral  Oral Oral  SpO2: 95%  99% 96%  Weight:      Height:        Intake/Output Summary (Last 24 hours) at 03/04/2022 0821 Last data filed at 03/03/2022 1606 Gross per 24 hour  Intake 4732.93 ml  Output --  Net 4732.93 ml    Filed Weights   02/27/22 1525  Weight: 103.8 kg     Data Reviewed:   CBC: Recent Labs  Lab 02/28/22 0110 03/01/22 1502 03/02/22 0111 03/03/22 0043 03/04/22 0313  WBC 8.4 9.2 9.4 9.5 8.3  NEUTROABS 6.3  --   --   --   --   HGB 9.5* 9.8* 9.6* 9.5* 9.3*  HCT 32.5* 32.6* 33.1* 31.9* 31.6*  MCV 72.4* 72.3* 73.2* 71.4* 73.8*  PLT 305 325 312 336 311   Basic  Metabolic Panel: Recent Labs  Lab 02/28/22 0110 03/01/22 0656 03/02/22 0111 03/03/22 0043 03/04/22 0313  NA 135 135 131* 135 136  K 3.2* 3.8 3.1* 3.4* 3.6  CL 108 108 105 105 105  CO2 16* 18* 22 21* 21*  GLUCOSE 112* 105* 126* 121* 109*  BUN '7 7 6 '$ <5* <5*  CREATININE 0.74 0.79 0.68 0.67 0.68  CALCIUM 8.0* 8.1* 8.1* 8.3* 8.5*  MG 1.9 1.9 1.8 1.8 1.9  PHOS 3.1  --   --   --   --    GFR: Estimated Creatinine Clearance: 97 mL/min (by C-G formula based on SCr of 0.68 mg/dL). Liver Function Tests: Recent Labs  Lab 02/27/22 1530 02/28/22 0110  AST 10* 11*  ALT  <5 9  ALKPHOS 93 82  BILITOT 0.7 0.9  PROT 7.6 5.9*  ALBUMIN 4.0 2.7*   Recent Labs  Lab 02/27/22 1530  LIPASE <10*   No results for input(s): "AMMONIA" in the last 168 hours. Coagulation Profile: No results for input(s): "INR", "PROTIME" in the last 168 hours. Cardiac Enzymes: No results for input(s): "CKTOTAL", "CKMB", "CKMBINDEX", "TROPONINI" in the last 168 hours. BNP (last 3 results) No results for input(s): "PROBNP" in the last 8760 hours. HbA1C: No results for input(s): "HGBA1C" in the last 72 hours. CBG: No results for input(s): "GLUCAP" in the last 168 hours. Lipid Profile: No results for input(s): "CHOL", "HDL", "LDLCALC", "TRIG", "CHOLHDL", "LDLDIRECT" in the last 72 hours. Thyroid Function Tests: No results for input(s): "TSH", "T4TOTAL", "FREET4", "T3FREE", "THYROIDAB" in the last 72 hours. Anemia Panel: No results for input(s): "VITAMINB12", "FOLATE", "FERRITIN", "TIBC", "IRON", "RETICCTPCT" in the last 72 hours. Sepsis Labs: No results for input(s): "PROCALCITON", "LATICACIDVEN" in the last 168 hours.  Recent Results (from the past 240 hour(s))  SARS Coronavirus 2 by RT PCR (hospital order, performed in [facility] hospital lab) *cepheid single result test* Anterior Nasal Swab     Status: None   Collection Time: 02/27/22 11:33 PM   Specimen: Anterior Nasal Swab  Result Value Ref Range Status   SARS Coronavirus 2 by RT PCR NEGATIVE NEGATIVE Final    Comment: (NOTE) SARS-CoV-2 target nucleic acids are NOT DETECTED.  The SARS-CoV-2 RNA is generally detectable in upper and lower respiratory specimens during the acute phase of infection. The lowest concentration of SARS-CoV-2 viral copies this assay can detect is 250 copies / mL. A negative result does not preclude SARS-CoV-2 infection and should not be used as the sole basis for treatment or other patient management decisions.  A negative result may occur with improper specimen collection / handling,  submission of specimen other than nasopharyngeal swab, presence of viral mutation(s) within the areas targeted by this assay, and inadequate number of viral copies (<250 copies / mL). A negative result must be combined with clinical observations, patient history, and epidemiological information.  Fact Sheet for Patients:   https://www.patel.info/  Fact Sheet for Healthcare Providers: https://hall.com/  This test is not yet approved or  cleared by the [place] FDA and has been authorized for detection and/or diagnosis of SARS-CoV-2 by FDA under an Emergency Use Authorization (EUA).  This EUA will remain in effect (meaning this test can be used) for the duration of the COVID-19 declaration under Section 564(b)(1) of the Act, 21 U.S.C. section 360bbb-3(b)(1), unless the authorization is terminated or revoked sooner.  Performed at [facility], [place] [address], [place], [place] [address]   Culture, blood (Routine X 2) w Reflex to  ID Panel     Status: None (Preliminary result)   Collection Time: 02/28/22  1:10 AM   Specimen: BLOOD  Result Value Ref Range Status   Specimen Description BLOOD RIGHT ANTECUBITAL  Final   Special Requests   Final    BOTTLES DRAWN AEROBIC AND ANAEROBIC Blood Culture adequate volume   Culture   Final    NO GROWTH 3 DAYS Performed at [facility], [address], [place], [place] [address]    Report Status PENDING  Incomplete  Culture, blood (Routine X 2) w Reflex to ID Panel     Status: None (Preliminary result)   Collection Time: 02/28/22  1:10 AM   Specimen: BLOOD LEFT HAND  Result Value Ref Range Status   Specimen Description BLOOD LEFT HAND  Final   Special Requests   Final    BOTTLES DRAWN AEROBIC AND ANAEROBIC Blood Culture adequate volume   Culture   Final    NO GROWTH 3 DAYS Performed at [facility], [place] [address], [place], [place] [address]    Report Status PENDING  Incomplete          Radiology Studies: [place] EKG SITE RITE  Result Date: 03/03/2022 If Site Rite image not attached, placement could not be confirmed due to current cardiac rhythm.       Scheduled Meds:  Chlorhexidine Gluconate Cloth  6 each Topical Daily   gabapentin  100 mg Oral QHS   Continuous Infusions:  dextrose 5 % and 0.45% NaCl 75 mL/hr at 03/03/22 1606   heparin 2,900 Units/hr (03/03/22 1825)     LOS: 5 days   Time spent= 35 mins    Dorna Mallet Arsenio Loader, MD [facility]  If 7PM-7AM, please contact night-coverage  03/04/2022, 8:21 AM

## 2022-03-04 NOTE — Progress Notes (Signed)
Mobility Specialist - Progress Note   03/04/22 1518  Mobility  Activity Ambulated independently in hallway  Level of Assistance Modified independent, requires aide device or extra time  Assistive Device Other (Comment) (IV Pole)  Distance Ambulated (ft) 300 ft  Activity Response Tolerated well  $Mobility charge 1 Mobility    Pt ambulated independently in hallway. No physical assistance required. Left in room w/ call bell in reach and all needs met.   Paulla Dolly Mobility Specialist

## 2022-03-04 NOTE — Progress Notes (Signed)
ANTICOAGULATION CONSULT NOTE - Follow Up Consult  Pharmacy Consult for Heparin Indication: portal vein thrombosis, splenic vein thrombosis, splenic infarct  No Known Allergies  Patient Measurements: Height: '5\' 3"'$  (160 cm) Weight: 103.8 kg (228 lb 13.4 oz) IBW/kg (Calculated) : 52.4 Heparin Dosing Weight: 77 kg  Vital Signs: Temp: 98.2 F (36.8 C) (09/25 0738) Temp Source: Oral (09/25 0738) BP: 115/71 (09/25 0738) Pulse Rate: 88 (09/25 0738)  Labs: Recent Labs    03/02/22 0111 03/02/22 1051 03/02/22 1715 03/03/22 0043 03/04/22 0313  HGB 9.6*  --   --  9.5* 9.3*  HCT 33.1*  --   --  31.9* 31.6*  PLT 312  --   --  336 311  HEPARINUNFRC 0.19*   < > 0.34 0.38 0.42  CREATININE 0.68  --   --  0.67 0.68   < > = values in this interval not displayed.     Estimated Creatinine Clearance: 97 mL/min (by C-G formula based on SCr of 0.68 mg/dL).   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 Heparin per pharmacy consult placed for splenic infarct, thrombosis.  Heparin level today is therapeutic at 0.42, on 2900 units/hr. Hgb 9.3, plt 311 - stable. No line issues or signs/symptoms of bleeding noted per RN.  Goal of Therapy:  Heparin level 0.3-0.7 units/ml Monitor platelets by anticoagulation protocol: Yes   Plan:  Continue IV heparin at 2900 units/hr. Daily CBC, heparin level. Monitor for signs/symptoms of bleeding. Follow-up ability to transition to oral anticoagulation.   Alanda Slim, PharmD, [facility] Clinical Pharmacist Please see AMION for all Pharmacists' Contact Phone  Numbers 03/04/2022, 9:51 AM

## 2022-03-05 DIAGNOSIS — D735 Infarction of spleen: Secondary | ICD-10-CM | POA: Diagnosis not present

## 2022-03-05 DIAGNOSIS — I82890 Acute embolism and thrombosis of other specified veins: Secondary | ICD-10-CM | POA: Diagnosis not present

## 2022-03-05 LAB — CBC
HCT: 31.7 % — ABNORMAL LOW (ref 36.0–46.0)
Hemoglobin: 9.5 g/dL — ABNORMAL LOW (ref 12.0–15.0)
MCH: 21.5 pg — ABNORMAL LOW (ref 26.0–34.0)
MCHC: 30 g/dL (ref 30.0–36.0)
MCV: 71.9 fL — ABNORMAL LOW (ref 80.0–100.0)
Platelets: 290 10*3/uL (ref 150–400)
RBC: 4.41 MIL/uL (ref 3.87–5.11)
RDW: 22.6 % — ABNORMAL HIGH (ref 11.5–15.5)
WBC: 8.6 10*3/uL (ref 4.0–10.5)
nRBC: 0.2 % (ref 0.0–0.2)

## 2022-03-05 LAB — BASIC METABOLIC PANEL
Anion gap: 5 (ref 5–15)
BUN: 6 mg/dL (ref 6–20)
CO2: 23 mmol/L (ref 22–32)
Calcium: 8.3 mg/dL — ABNORMAL LOW (ref 8.9–10.3)
Chloride: 105 mmol/L (ref 98–111)
Creatinine, Ser: 0.69 mg/dL (ref 0.44–1.00)
GFR, Estimated: >60 mL/min (ref >60)
Glucose, Bld: 99 mg/dL (ref 70–99)
Potassium: 3.5 mmol/L (ref 3.5–5.1)
Sodium: 133 mmol/L — ABNORMAL LOW (ref 135–145)

## 2022-03-05 LAB — HEPARIN LEVEL (UNFRACTIONATED): Heparin Unfractionated: 0.47 IU/mL (ref 0.30–0.70)

## 2022-03-05 LAB — CULTURE, BLOOD (ROUTINE X 2)
Culture: NO GROWTH
Culture: NO GROWTH
Special Requests: ADEQUATE
Special Requests: ADEQUATE

## 2022-03-05 LAB — MAGNESIUM: Magnesium: 1.9 mg/dL (ref 1.7–2.4)

## 2022-03-05 MED ORDER — APIXABAN 5 MG PO TABS
10.0000 mg | ORAL_TABLET | Freq: Two times a day (BID) | ORAL | Status: DC
  Administered 2022-03-05 – 2022-03-06 (×3): 10 mg via ORAL
  Filled 2022-03-05 (×3): qty 2

## 2022-03-05 MED ORDER — APIXABAN 5 MG PO TABS: 5.0000 mg | ORAL_TABLET | Freq: Two times a day (BID) | ORAL | Status: DC

## 2022-03-05 NOTE — Progress Notes (Signed)
[facility]  PROGRESS NOTE  Eileen [name] ZJI:[id number] DOB: [birth year] DOA: 02/27/2022 PCP: [name], MD   Brief HPI:    51 y.o. female with medical history significant for polycythemia vera on hydroxyurea and Jakafi, iron deficiency anemia on iron supplement, GERD, morbid obesity, who initially presented to [facility] ED with complaints of left upper quadrant abdominal pain for 1 month.  angio showed massive splenomegaly and splenic infarct.  PE study was negative.  She is on Heparin drip, but still having quit a bit of abdominal pain therefore her po intake is very poor. Waiting for her pain to improve   Subjective   Patient seen and examined, continues to complain of abdominal pain.  Diet advanced to soft diet today.  Vascular surgery recommends to switch from IV heparin to DOAC.  No intervention recommended.   Assessment/Plan:    Splenic infarct -Seen on CT abdomen/pelvis, unclear etiology -CT scan also showed massive splenic infarct, splenic vein thrombosis and portal with thrombosis -Started on IV heparin which has been switched to DOAC as per vascular surgery recommendation -Vascular surgery following; no intervention recommended -We will discontinue IV heparin and start Eliquis per pharmacy -Diet advanced to soft diet today  Diarrhea -Patient is that she has been having liquid stools for past 1 month -Has been slowing down -CT scan abdomen showed submucosal fatty infiltration throughout the colon consistent with chronic or prior inflammation.  No acute inflammatory changes noted. -Diet advance to soft diet today -Will monitor  Polycythemia vera on hydroxyurea and Jakafi Microcytic anemia; baseline Hb 12.0 Follows with outpatient Dr. [name].  On Jakafi outpatient -Hemoglobin has drifted down to 9.5.  Anemia is related to her previous phlebotomy.  Her last phlebotomy was at least 2-3 months ago. Dr. [name] briefly discussed with Dr. [name] on 9/21.  Ok with  PO Eliquis when able.    SIRS No obvious evidence of infection.  COVID-19 negative.  Monitor fever curve   Hypokalemia - Replete   Elevated BP, may be related to uncontrolled pain Pain control.  As needed medications ordered   Peripheral neuropathy on low-dose gabapentin Resumed home gabapentin   Severe morbid obesity BMI 40 Recommend weight loss outpatient with regular physical activity and healthy dieting.    Medications     apixaban  10 mg Oral BID   Followed by   Derrill Memo ON 03/09/2022] apixaban  5 mg Oral BID   Chlorhexidine Gluconate Cloth  6 each Topical Daily   gabapentin  100 mg Oral QHS     Data Reviewed:   CBG:  No results for input(s): "GLUCAP" in the last 168 hours.  SpO2: 95 %    Vitals:   03/05/22 0430 03/05/22 0549 03/05/22 0739 03/05/22 1509  BP: 125/68 122/64 124/70 136/78  Pulse: 94 97 100 (!) 106  Resp: '16 16 17 17  '$ Temp: 98.9 F (37.2 C) 98.7 F (37.1 C) 98.6 F (37 C) 98.1 F (36.7 C)  TempSrc: Oral Oral Oral Oral  SpO2: 96% 94% 100% 95%  Weight:      Height:          Data Reviewed:  Basic Metabolic Panel: Recent Labs  Lab 02/28/22 0110 03/01/22 0656 03/02/22 0111 03/03/22 0043 03/04/22 0313 03/05/22 0416  NA 135 135 131* 135 136 133*  K 3.2* 3.8 3.1* 3.4* 3.6 3.5  CL 108 108 105 105 105 105  CO2 16* 18* 22 21* 21* 23  GLUCOSE 112* 105* 126* 121* 109* 99  BUN '7 7 6 '$ <5* <5* 6  CREATININE 0.74 0.79 0.68 0.67 0.68 0.69  CALCIUM 8.0* 8.1* 8.1* 8.3* 8.5* 8.3*  MG 1.9 1.9 1.8 1.8 1.9 1.9  PHOS 3.1  --   --   --   --   --     CBC: Recent Labs  Lab 02/28/22 0110 03/01/22 1502 03/02/22 0111 03/03/22 0043 03/04/22 0313 03/05/22 0416  WBC 8.4 9.2 9.4 9.5 8.3 8.6  NEUTROABS 6.3  --   --   --   --   --   HGB 9.5* 9.8* 9.6* 9.5* 9.3* 9.5*  HCT 32.5* 32.6* 33.1* 31.9* 31.6* 31.7*  MCV 72.4* 72.3* 73.2* 71.4* 73.8* 71.9*  PLT 305 325 312 336 311 290    LFT Recent Labs  Lab 02/27/22 1530 02/28/22 0110  AST 10* 11*   ALT <5 9  ALKPHOS 93 82  BILITOT 0.7 0.9  PROT 7.6 5.9*  ALBUMIN 4.0 2.7*     Antibiotics: Anti-infectives (From admission, onward)    None        DVT prophylaxis: Apixaban  Code Status: Full code  Family Communication:    CONSULTS vascular surgery   Objective    Physical Examination:   General-appears in no acute distress Heart-S1-S2, regular, no murmur auscultated Lungs-clear to auscultation bilaterally, no wheezing or crackles auscultated Abdomen-soft, tender to palpation in left upper quadrant,  no organomegaly Extremities-no edema in the lower extremities Neuro-alert, oriented x3, no focal deficit noted  Status is: Inpatient:             [place]   [facility] If 7PM-7AM, please contact night-coverage at [email or web address], Office  [phone]   03/05/2022, 6:10 PM  LOS: 6 days

## 2022-03-05 NOTE — Discharge Instructions (Addendum)
Information on my medicine - ELIQUIS (apixaban)  This medication education was reviewed with me or my healthcare representative as part of my discharge preparation.   Why was Eliquis prescribed for you? Eliquis was prescribed to treat blood clots that may have been found in the veins of your legs (deep vein thrombosis) or in your lungs (pulmonary embolism) and to reduce the risk of them occurring again.  What do You need to know about Eliquis ? The starting dose is 10 mg (two 5 mg tablets) taken TWICE daily for the FOUR DAYS, then on 9/30  the dose is reduced to ONE 5 mg tablet taken TWICE daily.  Eliquis may be taken with or without food.   Try to take the dose about the same time in the morning and in the evening. If you have difficulty swallowing the tablet whole please discuss with your pharmacist how to take the medication safely.  Take Eliquis exactly as prescribed and DO NOT stop taking Eliquis without talking to the doctor who prescribed the medication.  Stopping may increase your risk of developing a new blood clot.  Refill your prescription before you run out.  After discharge, you should have regular check-up appointments with your healthcare provider that is prescribing your Eliquis.    What do you do if you miss a dose? If a dose of ELIQUIS is not taken at the scheduled time, take it as soon as possible on the same day and twice-daily administration should be resumed. The dose should not be doubled to make up for a missed dose.  Important Safety Information A possible side effect of Eliquis is bleeding. You should call your healthcare provider right away if you experience any of the following: Bleeding from an injury or your nose that does not stop. Unusual colored urine (red or dark brown) or unusual colored stools (red or black). Unusual bruising for unknown reasons. A serious fall or if you hit your head (even if there is no bleeding).  Some medicines may interact  with Eliquis and might increase your risk of bleeding or clotting while on Eliquis. To help avoid this, consult your healthcare provider or pharmacist prior to using any new prescription or non-prescription medications, including herbals, vitamins, non-steroidal anti-inflammatory drugs (NSAIDs) and supplements.  This website has more information on Eliquis (apixaban): http://www.eliquis.com/eliquis/home

## 2022-03-05 NOTE — Progress Notes (Signed)
Pt got up to the bathroom HR went up to 130s but did not sustained. Pt having upper airway wheezing and slight SOB. PRN breathing trt given. All needs met at this time.

## 2022-03-05 NOTE — Progress Notes (Signed)
Mobility Specialist - Progress Note   03/05/22 1059  Mobility  Activity Ambulated with assistance in hallway  Level of Assistance Standby assist, set-up cues, supervision of patient - no hands on  Assistive Device Other (Comment) (IV Pole)  Distance Ambulated (ft) 550 ft  Activity Response Tolerated well  $Mobility charge 1 Mobility    Pt received in room agreeable. Left EOB w/ call bell in reach and all needs met.   Paulla Dolly Mobility Specialist

## 2022-03-05 NOTE — Progress Notes (Signed)
ANTICOAGULATION CONSULT NOTE - Follow Up Consult  Pharmacy Consult for Heparin -> apixaban Indication: portal vein thrombosis, splenic vein thrombosis, splenic infarct  No Known Allergies  Patient Measurements: Height: '5\' 3"'$  (160 cm) Weight: 103.8 kg (228 lb 13.4 oz) IBW/kg (Calculated) : 52.4 Heparin Dosing Weight: 77 kg  Vital Signs: Temp: 98.6 F (37 C) (09/26 0739) Temp Source: Oral (09/26 0739) BP: 124/70 (09/26 0739) Pulse Rate: 100 (09/26 0739)  Labs: Recent Labs    03/03/22 0043 03/04/22 0313 03/05/22 0416  HGB 9.5* 9.3* 9.5*  HCT 31.9* 31.6* 31.7*  PLT 336 311 290  HEPARINUNFRC 0.38 0.Eileen 0.47  CREATININE 0.67 0.68 0.69     Estimated Creatinine Clearance: 97 mL/min (by C-G formula based on SCr of 0.69 mg/dL).   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 Heparin per pharmacy consult placed for splenic infarct, thrombosis.  Pharmacy consulted to switch to apixaban   Plan:  D/c IV heparin  Apixaban 10 mg po bid x 4 days, then apixaban 5 mg po bid Daily CBC. Monitor for signs/symptoms of bleeding.  Alanda Slim, PharmD, [facility] Clinical Pharmacist Please see AMION for all Pharmacists' Contact Phone Numbers 03/05/2022, 12:20 PM

## 2022-03-05 NOTE — Progress Notes (Signed)
  Progress Note    03/05/2022 8:07 AM * No surgery found *  Subjective:  sitting up on couch in room. Says pain is overall manageable on current regimen but still requiring scheduled pain medication. Did have increased pain on Sunday    Vitals:   03/05/22 0549 03/05/22 0739  BP: 122/64 124/70  Pulse: 97 100  Resp: 16 17  Temp: 98.7 F (37.1 C) 98.6 F (37 C)  SpO2: 94% 100%   Physical Exam: Cardiac:  regular Lungs:  non labored  Extremities: well perfused and warm with palpable DP pulses  Abdomen:  soft, mildly tender LUQ Neurologic: alert and oriented   CBC    Component Value Date/Time   WBC 8.6 03/05/2022 0416   RBC 4.41 03/05/2022 0416   HGB 9.5 (L) 03/05/2022 0416   HGB 12.0 02/06/2022 1440   HGB 13.4 05/21/2017 1519   HCT 31.7 (L) 03/05/2022 0416   HCT 46.2 05/21/2017 1519   PLT 290 03/05/2022 0416   PLT 298 02/06/2022 1440   PLT 849 (H) 05/21/2017 1519   MCV 71.9 (L) 03/05/2022 0416   MCV 65.7 (L) 05/21/2017 1519   MCH 21.5 (L) 03/05/2022 0416   MCHC 30.0 03/05/2022 0416   RDW 22.6 (H) 03/05/2022 0416   RDW 21.5 (H) 05/21/2017 1519   LYMPHSABS 1.2 02/28/2022 0110   LYMPHSABS 2.2 05/21/2017 1519   MONOABS 0.6 02/28/2022 0110   MONOABS 0.6 05/21/2017 1519   EOSABS 0.1 02/28/2022 0110   EOSABS 0.9 (H) 05/21/2017 1519   BASOSABS 0.2 (H) 02/28/2022 0110   BASOSABS 0.5 (H) 05/21/2017 1519    BMET    Component Value Date/Time   NA 133 (L) 03/05/2022 0416   NA 140 02/20/2016 1432   K 3.5 03/05/2022 0416   K 4.1 02/20/2016 1432   CL 105 03/05/2022 0416   CO2 23 03/05/2022 0416   CO2 25 02/20/2016 1432   GLUCOSE 99 03/05/2022 0416   GLUCOSE 154 (H) 02/20/2016 1432   BUN 6 03/05/2022 0416   BUN 11.1 02/20/2016 1432   CREATININE 0.69 03/05/2022 0416   CREATININE 1.0 02/20/2016 1432   CALCIUM 8.3 (L) 03/05/2022 0416   CALCIUM 9.8 02/20/2016 1432   GFRNONAA >60 03/05/2022 0416   GFRAA >60 12/06/2019 1239    INR    Component Value Date/Time    INR 1.4 (H) 07/11/2020 0518    No intake or output data in the 24 hours ending 03/05/22 0807   Assessment/Plan:  51 y.o. female with portal vein thrombosis, splenic vein thrombosis, and splenic infarct  Still with abdominal pain, mostly LUQ. Mostly controlled with current pain regimen Abdomen soft and non distended Tolerating liquid diet. Reports increased appetite  Continues to have loose stools No signs of infection or active bleeding On IV Heparin. Okay to transition to [place] when able No indication for intervention  Karoline Caldwell, [facility] [phone] 03/05/2022 8:07 AM

## 2022-03-06 ENCOUNTER — Encounter: Payer: Self-pay | Admitting: Oncology

## 2022-03-06 ENCOUNTER — Other Ambulatory Visit (HOSPITAL_COMMUNITY): Payer: Self-pay

## 2022-03-06 DIAGNOSIS — I82890 Acute embolism and thrombosis of other specified veins: Secondary | ICD-10-CM | POA: Diagnosis not present

## 2022-03-06 DIAGNOSIS — D735 Infarction of spleen: Secondary | ICD-10-CM | POA: Diagnosis not present

## 2022-03-06 LAB — CBC
HCT: 30.5 % — ABNORMAL LOW (ref 36.0–46.0)
Hemoglobin: 9 g/dL — ABNORMAL LOW (ref 12.0–15.0)
MCH: 21.2 pg — ABNORMAL LOW (ref 26.0–34.0)
MCHC: 29.5 g/dL — ABNORMAL LOW (ref 30.0–36.0)
MCV: 71.8 fL — ABNORMAL LOW (ref 80.0–100.0)
Platelets: 246 10*3/uL (ref 150–400)
RBC: 4.25 MIL/uL (ref 3.87–5.11)
RDW: 22.3 % — ABNORMAL HIGH (ref 11.5–15.5)
WBC: 8.3 10*3/uL (ref 4.0–10.5)
nRBC: 0 % (ref 0.0–0.2)

## 2022-03-06 LAB — BASIC METABOLIC PANEL
Anion gap: 10 (ref 5–15)
BUN: <5 mg/dL — ABNORMAL LOW (ref 6–20)
CO2: 21 mmol/L — ABNORMAL LOW (ref 22–32)
Calcium: 8.4 mg/dL — ABNORMAL LOW (ref 8.9–10.3)
Chloride: 106 mmol/L (ref 98–111)
Creatinine, Ser: 0.75 mg/dL (ref 0.44–1.00)
GFR, Estimated: >60 mL/min (ref >60)
Glucose, Bld: 109 mg/dL — ABNORMAL HIGH (ref 70–99)
Potassium: 3.1 mmol/L — ABNORMAL LOW (ref 3.5–5.1)
Sodium: 137 mmol/L (ref 135–145)

## 2022-03-06 LAB — POTASSIUM: Potassium: 3.8 mmol/L (ref 3.5–5.1)

## 2022-03-06 LAB — MAGNESIUM: Magnesium: 1.9 mg/dL (ref 1.7–2.4)

## 2022-03-06 MED ORDER — APIXABAN 5 MG PO TABS
5.0000 mg | ORAL_TABLET | Freq: Two times a day (BID) | ORAL | 1 refills | Status: DC
  Filled 2022-03-06: qty 60, 30d supply, fill #0
  Filled 2022-03-06: qty 66, 30d supply, fill #0

## 2022-03-06 MED ORDER — APIXABAN 5 MG PO TABS
10.0000 mg | ORAL_TABLET | Freq: Two times a day (BID) | ORAL | 0 refills | Status: DC
  Filled 2022-03-06: qty 66, 30d supply, fill #0
  Filled 2022-03-06: qty 12, 3d supply, fill #0

## 2022-03-06 MED ORDER — GABAPENTIN 100 MG PO CAPS: 100.0000 mg | ORAL_CAPSULE | Freq: Every day | ORAL | 3 refills | Status: DC

## 2022-03-06 MED ORDER — OXYCODONE HCL 10 MG PO TABS
10.0000 mg | ORAL_TABLET | Freq: Four times a day (QID) | ORAL | 0 refills | Status: DC | PRN
  Filled 2022-03-06: qty 20, 5d supply, fill #0

## 2022-03-06 MED ORDER — POTASSIUM CHLORIDE 10 MEQ/100ML IV SOLN
10.0000 meq | INTRAVENOUS | Status: AC
  Administered 2022-03-06 (×3): 10 meq via INTRAVENOUS
  Filled 2022-03-06 (×3): qty 100

## 2022-03-06 NOTE — Progress Notes (Signed)
Mobility Specialist - Progress Note   03/06/22 1003  Mobility  Activity Ambulated independently in hallway  Level of Assistance Independent  Assistive Device None  Distance Ambulated (ft) 550 ft  Activity Response Tolerated well  $Mobility charge 1 Mobility    Pt received in bed agreeable to mobility. Left in bed w/ call bell in reach and all needs met.   Paulla Dolly Mobility Specialist

## 2022-03-06 NOTE — Discharge Summary (Signed)
Physician Discharge Summary   Patient: Eileen [name] MRN: [id number] DOB: Oct 15, 1970  Admit date:     02/27/2022  Discharge date: 03/06/22  Discharge Physician: [name]   PCP: [name], MD   Recommendations at discharge:   Follow-up oncology as outpatient Follow-up PCP in 2 weeks  Discharge Diagnoses: Principal Problem:   Splenic infarct  Resolved Problems:   * No resolved hospital problems. *  Hospital Course:  51 y.o. 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  angio showed massive splenomegaly and splenic infarct.  PE study was negative.  She is on Heparin drip, but still having quit a bit of abdominal pain therefore her po intake is very poor. Waiting for her pain to improve    Assessment and Plan:  Splenic infarct -Seen on CT abdomen/pelvis, unclear etiology -CT scan also showed massive splenic infarct, splenic vein thrombosis and portal with thrombosis -Started on IV heparin which has been switched to DOAC as per vascular surgery recommendation -Vascular surgery following; no intervention recommended - IV heparin was discontinued and patient started on Eliquis per pharmacy -We will discharge on Eliquis 10 mg twice a day for 3 days then 5 mg p.o. twice daily; follow-up Dr. [name] for duration of treatment   Diarrhea -Patient is that she has been having liquid stools for past 1 month -Has been slowing down -CT scan abdomen showed submucosal fatty infiltration throughout the colon consistent with chronic or prior inflammation.  No acute inflammatory changes noted. -Diet advance to soft diet t; tolerating diet well    Hypokalemia -Potassium is 3.1 today -Patient was given  KCl 10 mg IV x3;  -recheck potassium is 3.8   Polycythemia vera  on hydroxyurea and Jakafi Microcytic anemia; baseline Hb 12.0 Follows with outpatient Dr. [name].  On Jakafi outpatient -Hemoglobin has drifted down to 9.0.  Anemia is related to her previous phlebotomy.  Her last phlebotomy was at least 2-3 months ago. Dr. [name] briefly discussed with Dr. [name] on 9/21.  Ok with PO Eliquis    SIRS No obvious evidence of infection.  COVID-19 negative.     Hypokalemia - Replete   Elevated BP, may be related to uncontrolled pain Pain control.  As needed medications ordered   Peripheral neuropathy on low-dose gabapentin Resumed home gabapentin   Severe morbid obesity BMI 40 Recommend weight loss outpatient with regular physical activity and healthy dieting.          Consultants: Vascular surgery Procedures performed: None Disposition: Home Diet recommendation:  Discharge Diet Orders (From admission, onward)     Start     Ordered   03/06/22 0000  Diet - low sodium heart healthy        03/06/22 1544           Cardiac diet DISCHARGE MEDICATION: Allergies as of 03/06/2022   No Known Allergies      Medication List     STOP taking these medications    HYDROcodone-acetaminophen 5-325 MG tablet Commonly known as: NORCO/VICODIN   sucralfate 1 g tablet Commonly known as: CARAFATE       TAKE these medications    acetaminophen 500 MG tablet Commonly known as: TYLENOL Take 2,000 mg by mouth every 6 (six) hours as needed for moderate pain or mild pain.   Eliquis  5 MG Tabs tablet Generic drug: apixaban Take 2 tablets (10 mg total) by mouth 2 (two) times daily for 3 days, then take 1 tablet ('5mg'$ ) twice daily   apixaban 5 MG Tabs tablet Commonly known as: ELIQUIS Take 1 tablet (5 mg total) by mouth 2 (two) times daily. Start taking on: March 09, 2022   gabapentin 100 MG capsule Commonly known as: Neurontin Take 1 capsule (100 mg total) by mouth at bedtime.   Jakafi 10 MG tablet Generic drug: ruxolitinib  phosphate TAKE 1 TABLET BY MOUTH TWICE  DAILY What changed: how much to take   omeprazole 20 MG tablet Commonly known as: PRILOSEC OTC Take 20 mg by mouth daily.   ondansetron 8 MG tablet Commonly known as: ZOFRAN Take 8 mg by mouth 3 (three) times daily.   Oxycodone HCl 10 MG Tabs Take 1 tablet (10 mg total) by mouth every 6 (six) hours as needed for moderate pain or severe pain.        Follow-up Information     [name], MD Follow up in 2 week(s).   Specialty: Family Medicine Contact information: [place] [address] [phone]         [name], MD. Schedule an appointment as soon as possible for a visit.   Specialty: Oncology Contact information: [place] [address] [phone]                Discharge Exam: [name] Weights   02/27/22 1525  Weight: 103.8 kg   General-appears in no acute distress Heart-S1-S2, regular, no murmur auscultated Lungs-clear to auscultation bilaterally, no wheezing or crackles auscultated Abdomen-soft, nontender, no organomegaly Extremities-no edema in the lower extremities Neuro-alert, oriented x3, no focal deficit noted  Condition at discharge: good  The results of significant diagnostics from this hospitalization (including imaging, microbiology, ancillary and laboratory) are listed below for reference.   Imaging Studies: [place] EKG SITE RITE  Result Date: 03/03/2022 If Site Rite image not attached, placement could not be confirmed due to current cardiac rhythm.  CT ABDOMEN PELVIS W CONTRAST  Result Date: 02/27/2022 CLINICAL DATA:  Acute abdominal pain. Left upper quadrant mass, spleen?. Radiologic records indicates history of polycythemia vera. EXAM: CT ABDOMEN AND PELVIS WITH CONTRAST TECHNIQUE: Multidetector CT imaging of the abdomen and pelvis was performed using the standard protocol following bolus administration of intravenous contrast.  RADIATION DOSE REDUCTION: This exam was performed according to the departmental dose-optimization program which includes automated exposure control, adjustment of the mA and/or kV according to patient size and/or use of iterative reconstruction technique. CONTRAST:  74m OMNIPAQUE IOHEXOL 350 MG/ML SOLN COMPARISON:  Abdominopelvic CT 07/07/2020 FINDINGS: Lower chest: Assessed on concurrent chest CT, reported separately. Hepatobiliary: Suggestion of mild hepatic steatosis. Focal fatty infiltration adjacent to the falciform ligament. There is thrombus within the intrahepatic and extrahepatic portal vein. Adjacent perivascular fat stranding. No discrete focal hepatic lesion. No convincing capsular nodularity. Cholecystectomy. The common bile duct is poorly defined on the current exam, but appears nondilated. Pancreas: Fatty atrophy. There is perivascular stranding adjacent to occluded portal and splenic veins, but no definite peripancreatic inflammation. Spleen: Massive splenomegaly with progression from prior exam. The spleen measures 19.3 x 7.4 x 23 cm (volume = 1700 cm^3). Wedge-shaped area of decreased attenuation within the anterior inferior aspect of the spleen typical of splenic infarct. The splenic vein is occluded. Adrenals/Urinary Tract: Normal adrenal glands. No hydronephrosis or perinephric edema. Homogeneous renal enhancement  with symmetric excretion on delayed phase imaging. No renal calculi or evidence of solid lesion. Urinary bladder is physiologically distended without wall thickening. Stomach/Bowel: Detailed bowel assessment is limited in the absence of enteric contrast and presence of ascites. Partially distended stomach. No small bowel obstruction, inflammation, pneumatosis or abnormal distention. Normal appendix. There is submucosal fatty infiltration throughout the colon consistent with chronic or prior inflammation. No acute inflammatory change. Vascular/Lymphatic: There is occlusion of the  splenic, intra and extrahepatic portal vein, and likely the the upper aspect of superior mesenteric vein. Few venous collaterals in the upper abdomen. Normal caliber abdominal aorta no suspicious adenopathy. Reproductive: Status post hysterectomy. No adnexal masses. Other: Small volume of abdominopelvic ascites, measuring simple fluid density. No free air. Tiny fat containing umbilical hernia. 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 No acute osseous findings. IMPRESSION: 1. Massive splenomegaly with progression from prior exam. Wedge-shaped area of decreased attenuation within the anterior inferior aspect of the spleen typical of splenic infarct. 2. Occlusion of the splenic, intra and extrahepatic portal veins, and likely the upper aspect of superior mesenteric vein. 3. Small volume of abdominopelvic ascites. 4. Mild hepatic steatosis. 5. Submucosal fatty infiltration throughout the colon consistent with chronic or prior inflammation. No acute inflammatory change. 6. 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 These results were called by telephone at the time of interpretation on 02/27/2022 at 5:36 pm to provider [facility] , who verbally acknowledged these results. Electronically Signed   By: Keith Rake M.D.   On: 02/27/2022 17:36   CT Angio Chest PE W and/or Wo Contrast  Result Date: 02/27/2022 CLINICAL DATA:  Pulmonary embolism (PE) suspected, positive D-dimer Left upper quadrant abdominal pain. EXAM: CT ANGIOGRAPHY CHEST WITH CONTRAST TECHNIQUE: Multidetector CT imaging of the chest was performed using the standard protocol during bolus administration of intravenous contrast. Multiplanar CT image reconstructions and MIPs were obtained to evaluate the vascular anatomy. Performed in conjunction with CT of the abdomen and pelvis, reported separately. RADIATION DOSE  REDUCTION: This exam was performed according to the departmental dose-optimization program which includes automated exposure control, adjustment of the mA and/or kV according to patient size and/or use of iterative reconstruction technique. CONTRAST:  75 mL OMNIPAQUE IOHEXOL 350 MG/ML SOLN COMPARISON:  Most recent chest imaging: Radiograph 12/06/2019. FINDINGS: Cardiovascular: There are no filling defects within the pulmonary arteries to suggest pulmonary embolus. The thoracic aorta is normal in caliber without acute findings. Normal heart size. No pericardial effusion. Mediastinum/Nodes: No mediastinal adenopathy or mass. No enlarged supraclavicular, axillary, or hilar lymph nodes. No esophageal wall thickening. No visible thyroid nodule. Lungs/Pleura: Trace bilateral pleural effusions. Subsegmental atelectasis in the lingula. No pneumonia or confluent airspace disease. No features of pulmonary edema. No endobronchial lesion. No pulmonary mass. Upper Abdomen: Assessed on concurrent abdominopelvic CT, reported separately. Musculoskeletal: The right second and third ribs are partially fused, may be sequela of remote injury or congenital. There are no acute or suspicious osseous abnormalities. No chest wall soft tissue abnormalities. Review of the MIP images confirms the above findings. IMPRESSION: 1. No pulmonary embolus. 2. Trace bilateral pleural effusions. Subsegmental atelectasis in the lingula. Electronically Signed   By: Keith Rake M.D.   On: 02/27/2022 17:26    Microbiology: Results for orders placed or performed during the hospital encounter of 02/27/22  SARS Coronavirus 2 by RT PCR (hospital order, performed in [facility] hospital lab) *cepheid single result test* Anterior Nasal Swab     Status: None   Collection Time: 02/27/22 11:33 PM   Specimen: Anterior Nasal Swab  Result Value Ref Range Status   SARS Coronavirus 2 by RT PCR NEGATIVE NEGATIVE Final    Comment: (NOTE) SARS-CoV-2 target  nucleic acids are NOT DETECTED.  The SARS-CoV-2 RNA is generally detectable in upper and lower respiratory specimens during the acute phase of infection. The lowest concentration of SARS-CoV-2 viral copies this assay can detect is 250 copies / mL. A negative result does not preclude SARS-CoV-2 infection and should not be used as the sole basis for treatment or other patient management decisions.  A negative result may occur with improper specimen collection / handling, submission of specimen other than nasopharyngeal swab, presence of viral mutation(s) within the areas targeted by this assay, and inadequate number of viral copies (<250 copies / mL). A negative result must be combined with clinical observations, patient history, and epidemiological information.  Fact Sheet for Patients:   https://www.patel.info/  Fact Sheet for Healthcare Providers: https://hall.com/  This test is not yet approved or  cleared by the [place] FDA and has been authorized for detection and/or diagnosis of SARS-CoV-2 by FDA under an Emergency Use Authorization (EUA).  This EUA will remain in effect (meaning this test can be used) for the duration of the COVID-19 declaration under Section 564(b)(1) of the Act, 21 U.S.C. section 360bbb-3(b)(1), unless the authorization is terminated or revoked sooner.  Performed at [facility], [place] [address], [place], [place] [address]   Culture, blood (Routine X 2) w Reflex to ID Panel     Status: None   Collection Time: 02/28/22  1:10 AM   Specimen: BLOOD  Result Value Ref Range Status   Specimen Description BLOOD RIGHT ANTECUBITAL  Final   Special Requests   Final    BOTTLES DRAWN AEROBIC AND ANAEROBIC Blood Culture adequate volume   Culture   Final    NO GROWTH 5 DAYS Performed at [facility], [place] [address] [place], [place] [address]    Report Status 03/05/2022 FINAL  Final  Culture, blood  (Routine X 2) w Reflex to ID Panel     Status: None   Collection Time: 02/28/22  1:10 AM   Specimen: BLOOD LEFT HAND  Result Value Ref Range Status   Specimen Description BLOOD LEFT HAND  Final   Special Requests   Final    BOTTLES DRAWN AEROBIC AND ANAEROBIC Blood Culture adequate volume   Culture   Final    NO GROWTH 5 DAYS Performed at [facility], [place] [address], [place], [place] [address]    Report Status 03/05/2022 FINAL  Final    Labs: CBC: Recent Labs  Lab 02/28/22 0110 03/01/22 1502 03/02/22 0111 03/03/22 0043 03/04/22 0313 03/05/22 0416 03/06/22 0304  WBC 8.4   < > 9.4 9.5 8.3 8.6 8.3  NEUTROABS 6.3  --   --   --   --   --   --   HGB 9.5*   < > 9.6* 9.5* 9.3* 9.5* 9.0*  HCT 32.5*   < > 33.1* 31.9* 31.6* 31.7* 30.5*  MCV 72.4*   < > 73.2* 71.4* 73.8* 71.9* 71.8*  PLT 305   < > 312 336 311 290 246   < > = values in this interval not displayed.   Basic Metabolic Panel: Recent Labs  Lab 02/28/22 0110 03/01/22 9381  03/02/22 0111 03/03/22 0043 03/04/22 0313 03/05/22 0416 03/06/22 0304 03/06/22 1500  NA 135   < > 131* 135 136 133* 137  --   K 3.2*   < > 3.1* 3.4* 3.6 3.5 3.1* 3.8  CL 108   < > 105 105 105 105 106  --   CO2 16*   < > 22 21* 21* 23 21*  --   GLUCOSE 112*   < > 126* 121* 109* 99 109*  --   BUN 7   < > 6 <5* <5* 6 <5*  --   CREATININE 0.74   < > 0.68 0.67 0.68 0.69 0.75  --   CALCIUM 8.0*   < > 8.1* 8.3* 8.5* 8.3* 8.4*  --   MG 1.9   < > 1.8 1.8 1.9 1.9 1.9  --   PHOS 3.1  --   --   --   --   --   --   --    < > = values in this interval not displayed.   Liver Function Tests: Recent Labs  Lab 02/28/22 0110  AST 11*  ALT 9  ALKPHOS 82  BILITOT 0.9  PROT 5.9*  ALBUMIN 2.7*   CBG: No results for input(s): "GLUCAP" in the last 168 hours.  Discharge time spent: greater than 30 minutes.  Signed: [name], MD [facility] 03/06/2022

## 2022-03-06 NOTE — Progress Notes (Signed)
[facility]  PROGRESS NOTE  Eileen [name] OEU:[id number] DOB: [birth year] DOA: 02/27/2022 PCP: [name], MD   Brief HPI:    51 y.o. 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  angio showed massive splenomegaly and splenic infarct.  PE study was negative.  She is on Heparin drip, but still having quit a bit of abdominal pain therefore her po intake is very poor. Waiting for her pain to improve   Subjective   Patient seen and examined, started on Eliquis yesterday.  Diarrhea is slowing down.  Tolerating soft diet.   Assessment/Plan:    Splenic infarct -Seen on CT abdomen/pelvis, unclear etiology -CT scan also showed massive splenic infarct, splenic vein thrombosis and portal with thrombosis -Started on IV heparin which has been switched to DOAC as per vascular surgery recommendation -Vascular surgery following; no intervention recommended - IV heparin was discontinued and patient started on Eliquis per pharmacy -Diet advanced to soft diet   Diarrhea -Patient is that she has been having liquid stools for past 1 month -Has been slowing down -CT scan abdomen showed submucosal fatty infiltration throughout the colon consistent with chronic or prior inflammation.  No acute inflammatory changes noted. -Diet advance to soft diet t; tolerating diet well -Will monitor  Hypokalemia -Potassium is 3.1 today -Start KCl 10 mg IV x3; recheck potassium later this evening and if stable, patient can be discharged home  Polycythemia vera on hydroxyurea and Jakafi Microcytic anemia; baseline Hb 12.0 Follows with outpatient Dr. [name].  On Jakafi outpatient -Hemoglobin has drifted down to 9.0.  Anemia is related to her previous phlebotomy.  Her last phlebotomy was at  least 2-3 months ago. Dr. [name] briefly discussed with Dr. [name] on 9/21.  Ok with PO Eliquis when able.    SIRS No obvious evidence of infection.  COVID-19 negative.  Monitor fever curve   Hypokalemia - Replete   Elevated BP, may be related to uncontrolled pain Pain control.  As needed medications ordered   Peripheral neuropathy on low-dose gabapentin Resumed home gabapentin   Severe morbid obesity BMI 40 Recommend weight loss outpatient with regular physical activity and healthy dieting.    Medications     apixaban  10 mg Oral BID   Followed by   Derrill Memo ON 03/09/2022] apixaban  5 mg Oral BID   Chlorhexidine Gluconate Cloth  6 each Topical Daily   gabapentin  100 mg Oral QHS     Data Reviewed:   CBG:  No results for input(s): "GLUCAP" in the last 168 hours.  SpO2: 97 %    Vitals:   03/05/22 1509 03/05/22 2036 03/06/22 0452 03/06/22 0714  BP: 136/78 109/64 115/69 133/80  Pulse: (!) 106 95 94 96  Resp: '17 17 17 16  '$ Temp: 98.1 F (36.7 C) 98.5 F (36.9 C) 98.6 F (37 C) 98.8 F (37.1 C)  TempSrc: Oral Oral Oral Oral  SpO2: 95% 94% 95% 97%  Weight:      Height:          Data Reviewed:  Basic Metabolic Panel: Recent Labs  Lab 02/28/22 0110 03/01/22 0656 03/02/22 0111 03/03/22 0043 03/04/22 0313 03/05/22 0416 03/06/22 0304  NA 135   < > 131* 135 136 133* 137  K 3.2*   < > 3.1* 3.4* 3.6 3.5 3.1*  CL 108   < > 105 105 105 105 106  CO2 16*   < > 22 21* 21* 23 21*  GLUCOSE 112*   < > 126* 121* 109* 99 109*  BUN 7   < > 6 <5* <5* 6 <5*  CREATININE 0.74   < > 0.68 0.67 0.68 0.69 0.75  CALCIUM 8.0*   < > 8.1* 8.3* 8.5* 8.3* 8.4*  MG 1.9   < > 1.8 1.8 1.9 1.9 1.9  PHOS 3.1  --   --   --   --   --   --    < > = values in this interval not displayed.    CBC: Recent Labs  Lab 02/28/22 0110 03/01/22 1502 03/02/22 0111 03/03/22 0043 03/04/22 0313 03/05/22 0416 03/06/22 0304  WBC 8.4   < > 9.4 9.5 8.3 8.6 8.3  NEUTROABS 6.3  --   --   --   --    --   --   HGB 9.5*   < > 9.6* 9.5* 9.3* 9.5* 9.0*  HCT 32.5*   < > 33.1* 31.9* 31.6* 31.7* 30.5*  MCV 72.4*   < > 73.2* 71.4* 73.8* 71.9* 71.8*  PLT 305   < > 312 336 311 290 246   < > = values in this interval not displayed.    LFT Recent Labs  Lab 02/27/22 1530 02/28/22 0110  AST 10* 11*  ALT <5 9  ALKPHOS 93 82  BILITOT 0.7 0.9  PROT 7.6 5.9*  ALBUMIN 4.0 2.7*     Antibiotics: Anti-infectives (From admission, onward)    None        DVT prophylaxis: Apixaban  Code Status: Full code  Family Communication:    CONSULTS vascular surgery   Objective    Physical Examination:  General-appears in no acute distress Heart-S1-S2, regular, no murmur auscultated Lungs-clear to auscultation bilaterally, no wheezing or crackles auscultated Abdomen-soft, mild left upper quadrant tenderness to palpation,  no organomegaly Extremities-no edema in the lower extremities Neuro-alert, oriented x3, no focal deficit noted   Status is: Inpatient:             [place]   [facility] If 7PM-7AM, please contact night-coverage at [email or web address], Office  [phone]   03/06/2022, 9:22 AM  LOS: 7 days

## 2022-03-06 NOTE — TOC Transition Note (Signed)
Transition of Care [facility]) - CM/SW Discharge Note   Patient Details  Name: Eileen [name] MRN: [id number] Date of Birth: [birth year]  Transition of Care [facility]) CM/SW Contact:  Carles Collet, RN Phone Number: 03/06/2022, 11:26 AM   Clinical Narrative:     Potential for DC today, as patient tolerates diet and afternoon labs come back. Patient has Eliquis card.  No further needs identified for DC.   Final next level of care: Home/Self Care Barriers to Discharge: No Barriers Identified   Patient Goals and CMS Choice        Discharge Placement                       Discharge Plan and Services                                     Social Determinants of Health (SDOH) Interventions     Readmission Risk Interventions     No data to display

## 2022-03-11 ENCOUNTER — Ambulatory Visit
Admission: RE | Admit: 2022-03-11 | Discharge: 2022-03-11 | Disposition: A | Discharge status: Home / Self Care | Payer: No Typology Code available for payment source | Source: Ambulatory Visit | Attending: Family Medicine | Admitting: Family Medicine

## 2022-03-11 ENCOUNTER — Other Ambulatory Visit: Payer: Self-pay | Admitting: Family Medicine

## 2022-03-11 DIAGNOSIS — R197 Diarrhea, unspecified: Secondary | ICD-10-CM

## 2022-03-12 ENCOUNTER — Telehealth: Payer: Self-pay | Admitting: *Deleted

## 2022-03-12 ENCOUNTER — Other Ambulatory Visit: Payer: Self-pay

## 2022-03-12 ENCOUNTER — Inpatient Hospital Stay (HOSPITAL_BASED_OUTPATIENT_CLINIC_OR_DEPARTMENT_OTHER): Payer: No Typology Code available for payment source | Admitting: Oncology

## 2022-03-12 ENCOUNTER — Inpatient Hospital Stay: Payer: No Typology Code available for payment source | Attending: Oncology

## 2022-03-12 VITALS — BP 146/99 | HR 102 | Temp 98.1°F | Resp 18 | Ht 63.0 in | Wt 236.2 lb

## 2022-03-12 DIAGNOSIS — D500 Iron deficiency anemia secondary to blood loss (chronic): Secondary | ICD-10-CM

## 2022-03-12 DIAGNOSIS — I82890 Acute embolism and thrombosis of other specified veins: Secondary | ICD-10-CM | POA: Diagnosis not present

## 2022-03-12 DIAGNOSIS — Z79899 Other long term (current) drug therapy: Secondary | ICD-10-CM | POA: Diagnosis not present

## 2022-03-12 DIAGNOSIS — D735 Infarction of spleen: Secondary | ICD-10-CM | POA: Diagnosis not present

## 2022-03-12 DIAGNOSIS — Z7901 Long term (current) use of anticoagulants: Secondary | ICD-10-CM | POA: Diagnosis not present

## 2022-03-12 DIAGNOSIS — E611 Iron deficiency: Secondary | ICD-10-CM | POA: Insufficient documentation

## 2022-03-12 DIAGNOSIS — D45 Polycythemia vera: Secondary | ICD-10-CM | POA: Diagnosis present

## 2022-03-12 LAB — CBC WITH DIFFERENTIAL (CANCER CENTER ONLY)
Abs Immature Granulocytes: 0.06 10*3/uL (ref 0.00–0.07)
Basophils Absolute: 0.3 10*3/uL — ABNORMAL HIGH (ref 0.0–0.1)
Basophils Relative: 3 %
Eosinophils Absolute: 0.3 10*3/uL (ref 0.0–0.5)
Eosinophils Relative: 3 %
HCT: 36.2 % (ref 36.0–46.0)
Hemoglobin: 10.7 g/dL — ABNORMAL LOW (ref 12.0–15.0)
Immature Granulocytes: 1 %
Lymphocytes Relative: 18 %
Lymphs Abs: 1.6 10*3/uL (ref 0.7–4.0)
MCH: 21.2 pg — ABNORMAL LOW (ref 26.0–34.0)
MCHC: 29.6 g/dL — ABNORMAL LOW (ref 30.0–36.0)
MCV: 71.7 fL — ABNORMAL LOW (ref 80.0–100.0)
Monocytes Absolute: 0.6 10*3/uL (ref 0.1–1.0)
Monocytes Relative: 7 %
Neutro Abs: 5.9 10*3/uL (ref 1.7–7.7)
Neutrophils Relative %: 68 %
Platelet Count: 163 10*3/uL (ref 150–400)
RBC: 5.05 MIL/uL (ref 3.87–5.11)
RDW: 23.5 % — ABNORMAL HIGH (ref 11.5–15.5)
WBC Count: 8.6 10*3/uL (ref 4.0–10.5)
nRBC: 0 % (ref 0.0–0.2)

## 2022-03-12 LAB — FERRITIN: Ferritin: 58 ng/mL (ref 11–307)

## 2022-03-12 LAB — IRON AND IRON BINDING CAPACITY (CC-WL,HP ONLY)
Iron: 14 ug/dL — ABNORMAL LOW (ref 28–170)
Saturation Ratios: 7 % — ABNORMAL LOW (ref 10.4–31.8)
TIBC: 190 ug/dL — ABNORMAL LOW (ref 250–450)
UIBC: 176 ug/dL (ref 148–442)

## 2022-03-12 NOTE — Progress Notes (Signed)
Hematology and Oncology Follow Up Visit  Eileen [name] [id number] November 10, 1970 51 y.o. 03/12/2022 12:38 PM Eileen [name] Eileen [name], MDRichter, Eileen Munroe, MD   Principle Diagnosis: 51 year old woman with polycythemia vera diagnosed in 2017.  She was found to have JAK2 positive disease.  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  Current therapy:   Therapeutic phlebotomy every 4 weeks to keep her hematocrit less than 45.   Jakafi 10 mg daily started in December 2022.   Interim History: Ms. Eileen [name] returns today for a repeat evaluation.  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  She was started on intravenous heparin and then transitioned into Eliquis.  Since her discharge, she continues to report left-sided upper quadrant pain without any significant improvement.  She does take oxycodone which have helped with her pain at this time.  She is currently on Eliquis without any bleeding.  She denies any nausea, vomiting but does report bloating and weight gain.     Medications: Reviewed without changes. Current Outpatient Medications  Medication Sig Dispense Refill   acetaminophen (TYLENOL) 500 MG tablet Take 2,000 mg by mouth every 6 (six) hours as needed for moderate pain or mild pain.     apixaban (ELIQUIS) 5 MG TABS tablet Take 2 tablets (10 mg total) by mouth 2 (two) times daily for 3 days, then take 1 tablet ('5mg'$ ) twice daily 66 tablet 0   apixaban (ELIQUIS) 5 MG TABS tablet Take 1 tablet (5 mg total) by mouth 2 (two) times daily. 60 tablet 1   gabapentin (NEURONTIN) 100 MG capsule Take 1 capsule (100 mg total) by mouth at bedtime. 90 capsule 3   JAKAFI 10 MG tablet TAKE 1 TABLET BY MOUTH TWICE  DAILY (Patient taking differently: Take 10 mg by mouth 2 (two) times daily.) 60 tablet 1   omeprazole (PRILOSEC OTC) 20 MG  tablet Take 20 mg by mouth daily.     ondansetron (ZOFRAN) 8 MG tablet Take 8 mg by mouth 3 (three) times daily.     Oxycodone HCl 10 MG TABS Take 1 tablet (10 mg total) by mouth every 6 (six) hours as needed for moderate pain or severe pain. 20 tablet 0   No current facility-administered medications for this visit.   Facility-Administered Medications Ordered in Other Visits  Medication Dose Route Frequency Provider Last Rate Last Admin   gadopentetate dimeglumine (MAGNEVIST) injection 18 mL  18 mL Intravenous Once PRN Melvenia Beam, MD         Allergies: No Known Allergies    Physical Exam:          Blood pressure (!) 146/99, pulse (!) 102, temperature 98.1 F (36.7 C), temperature source Temporal, resp. rate 18, height '5\' 3"'$  (1.6 m), weight 236 lb 3.2 oz (107.1 kg), SpO2 98 %.     ECOG: 0    General appearance: Alert, awake without any distress. Head: Atraumatic without abnormalities Oropharynx: Without any thrush or ulcers. Eyes: No scleral icterus. Lymph nodes: No lymphadenopathy noted in the cervical, supraclavicular, or axillary nodes Heart:regular rate and rhythm, without any murmurs or gallops.   Lung: Clear to auscultation without any rhonchi, wheezes or dullness to percussion. Abdomin: Soft, nontender without any shifting dullness or ascites. Musculoskeletal: No clubbing or cyanosis. Neurological: No motor or sensory deficits. Skin: No rashes or lesions.  Lab Results: Lab Results  Component Value Date   WBC 8.3 03/06/2022   HGB 9.0 (L) 03/06/2022   HCT 30.5 (L) 03/06/2022   MCV 71.8 (L) 03/06/2022   PLT 246 03/06/2022     Chemistry      Component Value Date/Time   NA 137 03/06/2022 0304   NA 140 02/20/2016 1432   K 3.8 03/06/2022 1500   K 4.1 02/20/2016 1432   CL 106 03/06/2022 0304   CO2 21 (L) 03/06/2022 0304   CO2 25 02/20/2016 1432   BUN <5 (L) 03/06/2022 0304   BUN 11.1 02/20/2016 1432   CREATININE 0.75  03/06/2022 0304   CREATININE 1.0 02/20/2016 1432      Component Value Date/Time   CALCIUM 8.4 (L) 03/06/2022 0304   CALCIUM 9.8 02/20/2016 1432   ALKPHOS 82 02/28/2022 0110   ALKPHOS 115 02/20/2016 1432   AST 11 (L) 02/28/2022 0110   AST 13 02/20/2016 1432   ALT 9 02/28/2022 0110   ALT 13 02/20/2016 1432   BILITOT 0.9 02/28/2022 0110   BILITOT 0.71 02/20/2016 1432      Impression and Plan:  51 year old woman with:  1.  JAK2 positive polycythemia vera diagnosed in 2017.    She is currently on Jakafi which she has tolerated very well and showed significant improvement of her constitutional symptoms in controlling her counts.  Risks and benefits of continuing this treatment in the setting of recent splenic vein thrombosis and splenic infarct were discussed.  At this time I have recommended stopping Jakafi for the time being.  I do not think there is a correlation between this medication and her splenic vein thrombosis but the timing is concerning.  I also recommended referral to [facility] for second opinion regarding her overall condition and future management.  2.  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  Duration of anticoagulation is to be determined but anticipating long-term treatment given her myeloproliferative disorder.   3.  Iron deficiency: Related to recent phlebotomy with iron studies improving without replacement.  4.  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  Splenectomy option was discussed especially in the setting of increased pain.  Complication associated with this procedure especially in the setting of recent thrombosis was discussed.  After discussion she is agreeable to consider this and we will make the appropriate referral to [facility] surgery.  5. Follow-up: In 3 months for a follow-up.  30  minutes were spent on this encounter.   The time was dedicated to updating her disease status, treatment choices, reviewing imaging studies and future plan of care review.  Zola Button, MD 10/3/202312:38 PM

## 2022-03-12 NOTE — Telephone Encounter (Signed)
Referral faxed to [facility]

## 2022-03-18 ENCOUNTER — Ambulatory Visit: Payer: Self-pay | Admitting: Surgery

## 2022-03-22 ENCOUNTER — Encounter: Payer: Self-pay | Admitting: *Deleted

## 2022-04-01 ENCOUNTER — Encounter (HOSPITAL_COMMUNITY): Payer: Self-pay | Admitting: Surgery

## 2022-04-01 ENCOUNTER — Other Ambulatory Visit: Payer: Self-pay

## 2022-04-01 NOTE — Pre-Procedure Instructions (Signed)
Patient Instructions  The night before surgery:  No food after midnight. ONLY clear liquids after midnight  The day of surgery (if you do NOT have diabetes):  Drink ONE (1) Pre-Surgery Clear Ensure by 04:30 AM the morning of surgery. Drink in one sitting. Do not sip.  This drink was given to you during your hospital  pre-op appointment visit.  Nothing else to drink after completing the  Pre-Surgery Clear Ensure.           If you have questions, please contact your surgeon's office.

## 2022-04-01 NOTE — Progress Notes (Signed)
Pt came to pick up her CHG soap and Pre-surgery Ensure. Written instructions for both given to her also.

## 2022-04-01 NOTE — Progress Notes (Signed)
[place] - Preparing for Surgery  Before surgery, you can play an important role.  Because skin is not sterile, your skin needs to be as free of germs as possible.  You can reduce the number of germs on you skin by washing with CHG (chlorahexidine gluconate) soap before surgery.  CHG is an antiseptic cleaner which kills germs and bonds with the skin to continue killing germs even after washing.  Oral Hygiene is also important in reducing the risk of infection.  Remember to brush your teeth with your regular toothpaste the morning of surgery.  Please DO NOT use if you have an allergy to CHG or antibacterial soaps.  If your skin becomes reddened/irritated stop using the CHG and inform your nurse when you arrive at Short Stay.  Do not shave (including legs and underarms) for at least 48 hours prior to the first CHG shower.  You may shave your face.  Please follow these instructions carefully:   1.  Shower with CHG Soap the night before surgery and the morning of Surgery.  2.  If you choose to wash your hair, wash your hair first as usual with your normal shampoo.  3.  After you shampoo, rinse your hair and body thoroughly to remove the shampoo. 4.  Use CHG as you would any other liquid soap.  You can apply chg directly to the skin and wash gently with a      scrungie or washcloth.           5.  Apply the CHG Soap to your body ONLY FROM THE NECK DOWN.   Do not use on open wounds or open sores. Avoid contact with your eyes, ears, mouth and genitals (private parts).  Wash genitals (private parts) with your normal soap.  6.  Wash thoroughly, paying special attention to the area where your surgery will be performed.  7.  Thoroughly rinse your body with warm water from the neck down.  8.  DO NOT shower/wash with your normal soap after using and rinsing off the CHG Soap.  9.  Pat yourself dry with a clean towel.            10.  Wear clean pajamas.            11.  Place clean sheets on your bed the  night of your first shower and do not sleep with pets.  Day of Surgery  Do not apply any lotions/deoderants the morning of surgery.   Please wear clean clothes to the hospital/surgery center. Remember to brush your teeth with toothpaste.

## 2022-04-01 NOTE — Progress Notes (Signed)
Spoke with pt for pre-op call. Pt denies HTN or Diabetes. She states she has had palpitations in the past, but never treated for them. Started on Eliquis in September when she was admitted for splenic infarct, splenic vein thrombosis and portal with thrombosis. Pt states her last dose was 03/29/22 PM dose.  Pt has hx of Polycythemia Vera. I have requested copy of pt's medical clearance from her oncologist. Dr. Josetta Huddle office is to fax that to [place].

## 2022-04-01 NOTE — Anesthesia Preprocedure Evaluation (Addendum)
Anesthesia Evaluation  Patient identified by MRN, date of birth, ID band Patient awake    Reviewed: Allergy & Precautions, NPO status , Patient's Chart, lab work & pertinent test results  Airway Mallampati: II  TM Distance: >3 FB Neck ROM: Full    Dental no notable dental hx.    Pulmonary neg pulmonary ROS,    Pulmonary exam normal        Cardiovascular + Peripheral Vascular Disease   Rhythm:Regular Rate:Normal     Neuro/Psych  Headaches, negative psych ROS   GI/Hepatic Neg liver ROS, PUD, GERD  Medicated,splenomegaly   Endo/Other  negative endocrine ROS  Renal/GU negative Renal ROS  negative genitourinary   Musculoskeletal negative musculoskeletal ROS (+)   Abdominal Normal abdominal exam  (+)   Peds  Hematology  (+) Blood dyscrasia, anemia , PCV   Anesthesia Other Findings   Reproductive/Obstetrics                            Anesthesia Physical Anesthesia Plan  ASA: 3  Anesthesia Plan: General and Regional   Post-op Pain Management: Regional block*   Induction: Intravenous  PONV Risk Score and Plan: 3 and Ondansetron, Dexamethasone, Midazolam and Treatment may vary due to age or medical condition  Airway Management Planned: Mask and Oral ETT  Additional Equipment: ClearSight  Intra-op Plan:   Post-operative Plan: Extubation in OR  Informed Consent: I have reviewed the patients History and Physical, chart, labs and discussed the procedure including the risks, benefits and alternatives for the proposed anesthesia with the patient or authorized representative who has indicated his/her understanding and acceptance.     Dental advisory given  Plan Discussed with: CRNA  Anesthesia Plan Comments: (PAT note written 04/01/2022 by Myra Gianotti, PA-C. For updated labs on arrival including T&S, Prepare PRBC.  Lab Results      Component                Value               Date                       WBC                      8.6                 04/02/2022                HGB                      10.7 (L)            04/02/2022                HCT                      36.4                04/02/2022                MCV                      76.2 (L)            04/02/2022                PLT  286                 04/02/2022           Lab Results      Component                Value               Date                      NA                       137                 03/06/2022                K                        3.8                 03/06/2022                CO2                      21 (L)              03/06/2022                GLUCOSE                  109 (H)             03/06/2022                BUN                      <5 (L)              03/06/2022                CREATININE               0.75                03/06/2022                CALCIUM                  8.4 (L)             03/06/2022                EGFR                     69 (L)              02/20/2016                GFRNONAA                 >60                 03/06/2022               )      Anesthesia Quick Evaluation

## 2022-04-01 NOTE — Progress Notes (Signed)
Anesthesia Chart Review: Eileen [name]  Case: [id number] Date/Time: 04/02/22 0715   Procedure: OPEN SPLENECTOMY - TAP BLOCK   Anesthesia type: General   Pre-op diagnosis: SPLENOMEGALY   Location: [facility] OR ROOM 01 / [place] OR   Surgeons: Erroll Luna, MD       DISCUSSION: Patient is a 51 year old female scheduled for the above procedure. Recent admission last month for massive splenomegaly with splenic infarct, splenic and portal vein thrombosis and started on Eliquis. Her hematologist referred her for splenectomy.  Other history includes never smoker, JAK2 positive polycythemia vera (diagnosed 2017), IDA (in setting of frequent  therapeutic phlebotomy; goal HCT < 45), palpitations, GERD, obesity.  [name] admission 02/27/22-03/06/22 for worsening LUQ abdominal pain and found to have a massively enlarged spleen with splenic infarct, splenic vein thrombosis, and portal vein thrombosis. CTA negative for PE.  She was treated with IV heparin and then transitioned to Eliquis. Vascular surgeon Jamelle Haring, MD did not recommend intervention at that time, but advised lifelong anticoagulation but could address with her primary hematologist further.   Last hematology follow-up with Dr. [name] was on 03/12/22 post hospitalization. She had been on hydroxyurea, Jakafi (started 05/2021), and therapeutic phlebotomy (about ~ every 4 weeks to keep HCT < 45). He wrote, "At this time I have recommended stopping Jakafi for the time being.  I do not think there is a correlation between this medication and her splenic vein thrombosis but the timing is concerning.  I also recommended referral to [facility] for second opinion regarding her overall condition and future management." Continue Eliquis for now. He also discussed splenectomy option given increased abdominal pain and added, "Complication associated with this procedure especially in the setting of recent thrombosis was discussed.  After discussion  she is agreeable to consider this and we will make the appropriate referral to [place] surgery." Three month follow-up planned.   She was evaluated by Dr. Brantley Stage on 03/18/22. Abdominal pain better controlled. He discussed open splenectomy "since embolization will be largely ineffective against the spleen is large nor could a laparoscopic approach to be done since the extraction port would be be as big as an open incision." He referred her to PCP for immunizations and reached out to Dr. [name] for anticoagulation recommendations. Per 03/22/22 letter (see Letters tab), "In regards to upcoming surgery, [name] needs to hold Eliquis for 48 hours prior to surgery. Resume Eliquis 24 hours after surgery." She reported last Eliqius dose 03/29/22 PM.  Discussed with anesthesiologist Nolon Nations, MD. Given open splenectomy for massive splenomegaly in an individual with underlying anemia, and recent anticoagulation, would advise repeat CBC, a T&S  and prepare PRBC (to have available if needed). Her last metabolic panel will be just shy of 79 days old and had had intermittent low potassium, so advised to repeat as well on the day of surgery.   Anesthesia team to further evaluate on arrival. Special needs includes General with TAP block.     VS:  BP Readings from Last 3 Encounters:  03/12/22 (!) 146/99  03/06/22 133/80  02/06/22 137/74   Pulse Readings from Last 3 Encounters:  03/12/22 (!) 102  03/06/22 96  02/06/22 100     PROVIDERS: [name], MD is PCP  Zola Button, MD is HEM   LABS: See DISCUSSION. Labs as of 03/12/22 include: Lab Results  Component Value Date   WBC 8.6 03/12/2022   HGB 10.7 (L) 03/12/2022   HCT 36.2 03/12/2022  PLT 163 03/12/2022   GLUCOSE 109 (H) 03/06/2022   ALT 9 02/28/2022   AST 11 (L) 02/28/2022   NA 137 03/06/2022   K 3.8 03/06/2022   CL 106 03/06/2022   CREATININE 0.75 03/06/2022   BUN <5 (L) 03/06/2022   CO2 21 (L) 03/06/2022      IMAGES: CTA Chest/abd/pelvis 02/27/22: IMPRESSION: 1. Massive splenomegaly with progression from prior exam. Wedge-shaped area of decreased attenuation within the anterior inferior aspect of the spleen typical of splenic infarct. 2. Occlusion of the splenic, intra and extrahepatic portal veins, and likely the upper aspect of superior mesenteric vein. 3. Small volume of abdominopelvic ascites. 4. Mild hepatic steatosis. 5. Submucosal fatty infiltration throughout the colon consistent with chronic or prior inflammation. No acute inflammatory change. 6. 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    EKG: 02/27/22: Sinus tachycardia at 107 bpm RSR' in V1 or V2, right VCD or RVH No significant change was found Confirmed by Ezequiel Essex [phone]) on 02/27/2022 4:01:42 PM   CV: RLE Venous US 10/07/21: IMPRESSION: No evidence of right lower extremity DVT.    Past Medical History:  Diagnosis Date   Acute duodenal ulcer without mention of hemorrhage, perforation, or obstruction    Anemia    due to the polycythemia   COVID 2022   mild case   Dysrhythmia    History of palpations, for many years   GERD (gastroesophageal reflux disease)    Headache    takes Advil   Polycythemia vera (HCC)    Reflux esophagitis     Past Surgical History:  Procedure Laterality Date   ABDOMINAL HYSTERECTOMY  2011ish   BIOPSY  07/11/2020   Procedure: BIOPSY;  Surgeon: Mauri Pole, MD;  Location: [facility];  Service: Endoscopy;;   CESAREAN SECTION     x 3    ESOPHAGOGASTRODUODENOSCOPY (EGD) WITH PROPOFOL N/A 07/11/2020   Procedure: ESOPHAGOGASTRODUODENOSCOPY (EGD) WITH PROPOFOL;  Surgeon: Mauri Pole, MD;  Location: [facility];  Service: Endoscopy;  Laterality: N/A;   ORIF ANKLE FRACTURE Left 12/21/2014   Procedure: OPEN REDUCTION INTERNAL FIXATION (ORIF) LEFT LATERAL MALLEOLUS ANKLE FRACTURE;  Surgeon: Leandrew Koyanagi, MD;   Location: [facility];  Service: Orthopedics;  Laterality: Left;  Needs RNFA   UPPER GI ENDOSCOPY      MEDICATIONS: No current facility-administered medications for this encounter.    acetaminophen (TYLENOL) 500 MG tablet   apixaban (ELIQUIS) 5 MG TABS tablet   omeprazole (PRILOSEC OTC) 20 MG tablet   ondansetron (ZOFRAN) 8 MG tablet   oxyCODONE (OXY IR/ROXICODONE) 5 MG immediate release tablet   apixaban (ELIQUIS) 5 MG TABS tablet   gabapentin (NEURONTIN) 100 MG capsule   JAKAFI 10 MG tablet   Oxycodone HCl 10 MG TABS    gadopentetate dimeglumine (MAGNEVIST) injection 18 mL   No currently taking Neurontin, Jakafi, oxycodone 10 mg tablets, or Eliquis. She has been on Eliquis 5 mg BID, but on hold after 03/29/22 PM dose for surgery.   Myra Gianotti, PA-C Surgical Short Stay/Anesthesiology [facility] Phone [phone] [facility] Phone [phone] 04/01/2022 1:52 PM

## 2022-04-02 ENCOUNTER — Other Ambulatory Visit: Payer: Self-pay

## 2022-04-02 ENCOUNTER — Inpatient Hospital Stay (HOSPITAL_COMMUNITY): Payer: No Typology Code available for payment source | Admitting: Vascular Surgery

## 2022-04-02 ENCOUNTER — Inpatient Hospital Stay (HOSPITAL_COMMUNITY)
Admission: RE | Admit: 2022-04-02 | Discharge: 2022-04-05 | DRG: 821 | Disposition: A | Discharge status: Home / Self Care | Payer: No Typology Code available for payment source | Attending: Surgery | Admitting: Surgery

## 2022-04-02 ENCOUNTER — Encounter (HOSPITAL_COMMUNITY): Admission: RE | Disposition: A | Discharge status: Home / Self Care | Payer: Self-pay | Source: Home / Self Care

## 2022-04-02 ENCOUNTER — Encounter (HOSPITAL_COMMUNITY): Payer: Self-pay | Admitting: Surgery

## 2022-04-02 DIAGNOSIS — Z6839 Body mass index (BMI) 39.0-39.9, adult: Secondary | ICD-10-CM

## 2022-04-02 DIAGNOSIS — Z90710 Acquired absence of both cervix and uterus: Secondary | ICD-10-CM

## 2022-04-02 DIAGNOSIS — D509 Iron deficiency anemia, unspecified: Secondary | ICD-10-CM | POA: Diagnosis present

## 2022-04-02 DIAGNOSIS — K2100 Gastro-esophageal reflux disease with esophagitis, without bleeding: Secondary | ICD-10-CM | POA: Diagnosis present

## 2022-04-02 DIAGNOSIS — K279 Peptic ulcer, site unspecified, unspecified as acute or chronic, without hemorrhage or perforation: Secondary | ICD-10-CM | POA: Diagnosis not present

## 2022-04-02 DIAGNOSIS — R6881 Early satiety: Secondary | ICD-10-CM | POA: Diagnosis present

## 2022-04-02 DIAGNOSIS — Z8616 Personal history of COVID-19: Secondary | ICD-10-CM

## 2022-04-02 DIAGNOSIS — Z8711 Personal history of peptic ulcer disease: Secondary | ICD-10-CM

## 2022-04-02 DIAGNOSIS — D500 Iron deficiency anemia secondary to blood loss (chronic): Principal | ICD-10-CM

## 2022-04-02 DIAGNOSIS — R519 Headache, unspecified: Secondary | ICD-10-CM | POA: Diagnosis present

## 2022-04-02 DIAGNOSIS — D649 Anemia, unspecified: Secondary | ICD-10-CM

## 2022-04-02 DIAGNOSIS — D45 Polycythemia vera: Secondary | ICD-10-CM | POA: Diagnosis present

## 2022-04-02 DIAGNOSIS — D735 Infarction of spleen: Secondary | ICD-10-CM | POA: Diagnosis present

## 2022-04-02 DIAGNOSIS — G8929 Other chronic pain: Secondary | ICD-10-CM | POA: Diagnosis present

## 2022-04-02 DIAGNOSIS — I739 Peripheral vascular disease, unspecified: Secondary | ICD-10-CM | POA: Diagnosis present

## 2022-04-02 DIAGNOSIS — Z86718 Personal history of other venous thrombosis and embolism: Secondary | ICD-10-CM

## 2022-04-02 DIAGNOSIS — R161 Splenomegaly, not elsewhere classified: Secondary | ICD-10-CM | POA: Diagnosis present

## 2022-04-02 DIAGNOSIS — Z01818 Encounter for other preprocedural examination: Secondary | ICD-10-CM

## 2022-04-02 DIAGNOSIS — M4316 Spondylolisthesis, lumbar region: Secondary | ICD-10-CM | POA: Diagnosis present

## 2022-04-02 DIAGNOSIS — Z79899 Other long term (current) drug therapy: Secondary | ICD-10-CM

## 2022-04-02 DIAGNOSIS — I82890 Acute embolism and thrombosis of other specified veins: Secondary | ICD-10-CM | POA: Diagnosis present

## 2022-04-02 DIAGNOSIS — Z7901 Long term (current) use of anticoagulants: Secondary | ICD-10-CM | POA: Diagnosis not present

## 2022-04-02 DIAGNOSIS — R188 Other ascites: Secondary | ICD-10-CM | POA: Diagnosis present

## 2022-04-02 DIAGNOSIS — K760 Fatty (change of) liver, not elsewhere classified: Secondary | ICD-10-CM | POA: Diagnosis present

## 2022-04-02 HISTORY — DX: Anemia, unspecified: D64.9

## 2022-04-02 HISTORY — PX: SPLENECTOMY, TOTAL: SHX788

## 2022-04-02 LAB — BASIC METABOLIC PANEL
Anion gap: 8 (ref 5–15)
BUN: 7 mg/dL (ref 6–20)
CO2: 22 mmol/L (ref 22–32)
Calcium: 9.1 mg/dL (ref 8.9–10.3)
Chloride: 106 mmol/L (ref 98–111)
Creatinine, Ser: 0.84 mg/dL (ref 0.44–1.00)
GFR, Estimated: >60 mL/min (ref >60)
Glucose, Bld: 150 mg/dL — ABNORMAL HIGH (ref 70–99)
Potassium: 3.5 mmol/L (ref 3.5–5.1)
Sodium: 136 mmol/L (ref 135–145)

## 2022-04-02 LAB — CBC
HCT: 36.4 % (ref 36.0–46.0)
Hemoglobin: 10.7 g/dL — ABNORMAL LOW (ref 12.0–15.0)
MCH: 22.4 pg — ABNORMAL LOW (ref 26.0–34.0)
MCHC: 29.4 g/dL — ABNORMAL LOW (ref 30.0–36.0)
MCV: 76.2 fL — ABNORMAL LOW (ref 80.0–100.0)
Platelets: 286 10*3/uL (ref 150–400)
RBC: 4.78 MIL/uL (ref 3.87–5.11)
RDW: 26.5 % — ABNORMAL HIGH (ref 11.5–15.5)
WBC: 8.6 10*3/uL (ref 4.0–10.5)
nRBC: 0 % (ref 0.0–0.2)

## 2022-04-02 LAB — HEMOGLOBIN AND HEMATOCRIT, BLOOD
HCT: 38.3 % (ref 36.0–46.0)
Hemoglobin: 11.6 g/dL — ABNORMAL LOW (ref 12.0–15.0)

## 2022-04-02 LAB — ABO/RH: ABO/RH(D): B POS

## 2022-04-02 LAB — PREPARE RBC (CROSSMATCH)

## 2022-04-02 SURGERY — SPLENECTOMY
Anesthesia: Regional

## 2022-04-02 MED ORDER — ONDANSETRON HCL 4 MG/2ML IJ SOLN
INTRAMUSCULAR | Status: AC
  Filled 2022-04-02: qty 2

## 2022-04-02 MED ORDER — GABAPENTIN 300 MG PO CAPS
300.0000 mg | ORAL_CAPSULE | ORAL | Status: AC
  Administered 2022-04-02: 300 mg via ORAL
  Filled 2022-04-02: qty 1

## 2022-04-02 MED ORDER — CHLORHEXIDINE GLUCONATE 0.12 % MT SOLN
OROMUCOSAL | Status: AC
  Administered 2022-04-02: 15 mL
  Filled 2022-04-02: qty 15

## 2022-04-02 MED ORDER — LIDOCAINE 2% (20 MG/ML) 5 ML SYRINGE
INTRAMUSCULAR | Status: AC
  Filled 2022-04-02: qty 5

## 2022-04-02 MED ORDER — MIDAZOLAM HCL 2 MG/2ML IJ SOLN
INTRAMUSCULAR | Status: DC | PRN
  Administered 2022-04-02: 2 mg via INTRAVENOUS

## 2022-04-02 MED ORDER — HYDROMORPHONE 1 MG/ML IV SOLN
INTRAVENOUS | Status: DC
  Administered 2022-04-02 – 2022-04-03 (×2): 1.2 mg via INTRAVENOUS
  Administered 2022-04-03: 2.7 mg via INTRAVENOUS
  Administered 2022-04-03: 1.8 mg via INTRAVENOUS
  Administered 2022-04-03: 1.5 mg via INTRAVENOUS
  Administered 2022-04-04: 0.9 mg via INTRAVENOUS

## 2022-04-02 MED ORDER — THROMBIN 20000 UNITS EX KIT
PACK | CUTANEOUS | Status: AC
  Filled 2022-04-02: qty 1

## 2022-04-02 MED ORDER — ACETAMINOPHEN 10 MG/ML IV SOLN
1000.0000 mg | Freq: Four times a day (QID) | INTRAVENOUS | Status: AC
  Administered 2022-04-02 – 2022-04-03 (×3): 1000 mg via INTRAVENOUS
  Filled 2022-04-02 (×3): qty 100

## 2022-04-02 MED ORDER — LACTATED RINGERS IV SOLN: INTRAVENOUS | Status: DC | PRN

## 2022-04-02 MED ORDER — SODIUM CHLORIDE 0.9% IV SOLUTION: Freq: Once | INTRAVENOUS | Status: DC

## 2022-04-02 MED ORDER — KETAMINE HCL 50 MG/5ML IJ SOSY
PREFILLED_SYRINGE | INTRAMUSCULAR | Status: AC
  Filled 2022-04-02: qty 5

## 2022-04-02 MED ORDER — ROCURONIUM BROMIDE 10 MG/ML (PF) SYRINGE
PREFILLED_SYRINGE | INTRAVENOUS | Status: DC | PRN
  Administered 2022-04-02: 10 mg via INTRAVENOUS
  Administered 2022-04-02: 30 mg via INTRAVENOUS
  Administered 2022-04-02: 60 mg via INTRAVENOUS
  Administered 2022-04-02: 10 mg via INTRAVENOUS

## 2022-04-02 MED ORDER — FENTANYL CITRATE (PF) 100 MCG/2ML IJ SOLN
INTRAMUSCULAR | Status: AC
  Filled 2022-04-02: qty 2

## 2022-04-02 MED ORDER — ONDANSETRON HCL 4 MG/2ML IJ SOLN
INTRAMUSCULAR | Status: DC | PRN
  Administered 2022-04-02: 4 mg via INTRAVENOUS

## 2022-04-02 MED ORDER — DIPHENHYDRAMINE HCL 50 MG/ML IJ SOLN: 12.5000 mg | Freq: Four times a day (QID) | INTRAMUSCULAR | Status: DC | PRN

## 2022-04-02 MED ORDER — METHOCARBAMOL 500 MG PO TABS
500.0000 mg | ORAL_TABLET | Freq: Four times a day (QID) | ORAL | Status: DC | PRN
  Filled 2022-04-02: qty 1

## 2022-04-02 MED ORDER — DIPHENHYDRAMINE HCL 12.5 MG/5ML PO ELIX: 12.5000 mg | ORAL_SOLUTION | Freq: Four times a day (QID) | ORAL | Status: DC | PRN

## 2022-04-02 MED ORDER — CHLORHEXIDINE GLUCONATE CLOTH 2 % EX PADS: 6.0000 | MEDICATED_PAD | Freq: Once | CUTANEOUS | Status: DC

## 2022-04-02 MED ORDER — PROPOFOL 10 MG/ML IV BOLUS
INTRAVENOUS | Status: AC
  Filled 2022-04-02: qty 20

## 2022-04-02 MED ORDER — ONDANSETRON 4 MG PO TBDP
4.0000 mg | ORAL_TABLET | Freq: Four times a day (QID) | ORAL | Status: DC | PRN
  Filled 2022-04-02: qty 1

## 2022-04-02 MED ORDER — MIDAZOLAM HCL 2 MG/2ML IJ SOLN
INTRAMUSCULAR | Status: AC
  Filled 2022-04-02: qty 2

## 2022-04-02 MED ORDER — CEFAZOLIN SODIUM-DEXTROSE 2-4 GM/100ML-% IV SOLN
2.0000 g | Freq: Three times a day (TID) | INTRAVENOUS | Status: AC
  Administered 2022-04-02: 2 g via INTRAVENOUS
  Filled 2022-04-02: qty 100

## 2022-04-02 MED ORDER — KETOROLAC TROMETHAMINE 15 MG/ML IJ SOLN
INTRAMUSCULAR | Status: AC
  Filled 2022-04-02: qty 1

## 2022-04-02 MED ORDER — FENTANYL CITRATE (PF) 250 MCG/5ML IJ SOLN
INTRAMUSCULAR | Status: AC
  Filled 2022-04-02: qty 5

## 2022-04-02 MED ORDER — PHENYLEPHRINE HCL-NACL 20-0.9 MG/250ML-% IV SOLN
INTRAVENOUS | Status: DC | PRN
  Administered 2022-04-02: 25 ug/min via INTRAVENOUS

## 2022-04-02 MED ORDER — ROCURONIUM BROMIDE 10 MG/ML (PF) SYRINGE
PREFILLED_SYRINGE | INTRAVENOUS | Status: AC
  Filled 2022-04-02: qty 20

## 2022-04-02 MED ORDER — FENTANYL CITRATE (PF) 250 MCG/5ML IJ SOLN
INTRAMUSCULAR | Status: DC | PRN
  Administered 2022-04-02 (×8): 50 ug via INTRAVENOUS

## 2022-04-02 MED ORDER — SODIUM CHLORIDE 0.9% FLUSH: 9.0000 mL | INTRAVENOUS | Status: DC | PRN

## 2022-04-02 MED ORDER — DEXAMETHASONE SODIUM PHOSPHATE 10 MG/ML IJ SOLN
INTRAMUSCULAR | Status: AC
  Filled 2022-04-02: qty 1

## 2022-04-02 MED ORDER — KETOROLAC TROMETHAMINE 15 MG/ML IJ SOLN
15.0000 mg | Freq: Four times a day (QID) | INTRAMUSCULAR | Status: DC | PRN
  Administered 2022-04-03 – 2022-04-05 (×3): 15 mg via INTRAVENOUS
  Filled 2022-04-02 (×3): qty 1

## 2022-04-02 MED ORDER — 0.9 % SODIUM CHLORIDE (POUR BTL) OPTIME
TOPICAL | Status: DC | PRN
  Administered 2022-04-02: 2000 mL
  Administered 2022-04-02: 1000 mL

## 2022-04-02 MED ORDER — KETOROLAC TROMETHAMINE 15 MG/ML IJ SOLN
15.0000 mg | Freq: Four times a day (QID) | INTRAMUSCULAR | Status: AC
  Administered 2022-04-02: 15 mg via INTRAVENOUS

## 2022-04-02 MED ORDER — LIDOCAINE 2% (20 MG/ML) 5 ML SYRINGE
INTRAMUSCULAR | Status: DC | PRN
  Administered 2022-04-02: 60 mg via INTRAVENOUS

## 2022-04-02 MED ORDER — NALOXONE HCL 0.4 MG/ML IJ SOLN: 0.4000 mg | INTRAMUSCULAR | Status: DC | PRN

## 2022-04-02 MED ORDER — METOPROLOL TARTRATE 5 MG/5ML IV SOLN: 5.0000 mg | Freq: Four times a day (QID) | INTRAVENOUS | Status: DC | PRN

## 2022-04-02 MED ORDER — SODIUM CHLORIDE 0.9 % IV SOLN: INTRAVENOUS | Status: DC | PRN

## 2022-04-02 MED ORDER — AMISULPRIDE (ANTIEMETIC) 5 MG/2ML IV SOLN
INTRAVENOUS | Status: AC
  Filled 2022-04-02: qty 4

## 2022-04-02 MED ORDER — ACETAMINOPHEN 500 MG PO TABS
1000.0000 mg | ORAL_TABLET | ORAL | Status: AC
  Administered 2022-04-02: 1000 mg via ORAL
  Filled 2022-04-02: qty 2

## 2022-04-02 MED ORDER — SUGAMMADEX SODIUM 200 MG/2ML IV SOLN
INTRAVENOUS | Status: DC | PRN
  Administered 2022-04-02: 200 mg via INTRAVENOUS

## 2022-04-02 MED ORDER — METHOCARBAMOL 1000 MG/10ML IJ SOLN
500.0000 mg | Freq: Four times a day (QID) | INTRAVENOUS | Status: DC
  Administered 2022-04-02 – 2022-04-05 (×9): 500 mg via INTRAVENOUS
  Filled 2022-04-02 (×3): qty 5
  Filled 2022-04-02 (×3): qty 500
  Filled 2022-04-02 (×4): qty 5
  Filled 2022-04-02: qty 500
  Filled 2022-04-02 (×4): qty 5

## 2022-04-02 MED ORDER — ONDANSETRON HCL 4 MG/2ML IJ SOLN: 4.0000 mg | Freq: Four times a day (QID) | INTRAMUSCULAR | Status: DC | PRN

## 2022-04-02 MED ORDER — DEXAMETHASONE SODIUM PHOSPHATE 10 MG/ML IJ SOLN
INTRAMUSCULAR | Status: DC | PRN
  Administered 2022-04-02: 10 mg

## 2022-04-02 MED ORDER — CEFAZOLIN IN SODIUM CHLORIDE 3-0.9 GM/100ML-% IV SOLN
3.0000 g | INTRAVENOUS | Status: AC
  Administered 2022-04-02: 3 g via INTRAVENOUS
  Filled 2022-04-02: qty 100

## 2022-04-02 MED ORDER — VISTASEAL 10 ML SINGLE DOSE KIT
PACK | CUTANEOUS | Status: DC | PRN
  Administered 2022-04-02: 10 mL via TOPICAL

## 2022-04-02 MED ORDER — AMISULPRIDE (ANTIEMETIC) 5 MG/2ML IV SOLN
10.0000 mg | Freq: Once | INTRAVENOUS | Status: AC
  Administered 2022-04-02: 10 mg via INTRAVENOUS

## 2022-04-02 MED ORDER — KETAMINE HCL 10 MG/ML IJ SOLN
INTRAMUSCULAR | Status: DC | PRN
  Administered 2022-04-02: 25 mg via INTRAVENOUS

## 2022-04-02 MED ORDER — HYDROMORPHONE 1 MG/ML IV SOLN
INTRAVENOUS | Status: DC
  Filled 2022-04-02: qty 30

## 2022-04-02 MED ORDER — DEXTROSE-NACL 5-0.9 % IV SOLN: INTRAVENOUS | Status: DC

## 2022-04-02 MED ORDER — THROMBIN (RECOMBINANT) 20000 UNITS EX SOLR
CUTANEOUS | Status: AC
  Filled 2022-04-02: qty 20000

## 2022-04-02 MED ORDER — ROPIVACAINE HCL 5 MG/ML IJ SOLN
INTRAMUSCULAR | Status: DC | PRN
  Administered 2022-04-02: 30 mL

## 2022-04-02 MED ORDER — ZOLPIDEM TARTRATE 5 MG PO TABS: 5.0000 mg | ORAL_TABLET | Freq: Every evening | ORAL | Status: DC | PRN

## 2022-04-02 MED ORDER — PROPOFOL 10 MG/ML IV BOLUS
INTRAVENOUS | Status: DC | PRN
  Administered 2022-04-02: 30 mg via INTRAVENOUS
  Administered 2022-04-02: 150 mg via INTRAVENOUS

## 2022-04-02 MED ORDER — ALBUMIN HUMAN 5 % IV SOLN: INTRAVENOUS | Status: DC | PRN

## 2022-04-02 MED ORDER — ENOXAPARIN SODIUM 40 MG/0.4ML IJ SOSY
40.0000 mg | PREFILLED_SYRINGE | INTRAMUSCULAR | Status: AC
  Administered 2022-04-03: 40 mg via SUBCUTANEOUS
  Filled 2022-04-02: qty 0.4

## 2022-04-02 MED ORDER — FENTANYL CITRATE (PF) 100 MCG/2ML IJ SOLN
25.0000 ug | INTRAMUSCULAR | Status: DC | PRN
  Administered 2022-04-02: 50 ug via INTRAVENOUS
  Administered 2022-04-02 (×2): 25 ug via INTRAVENOUS
  Administered 2022-04-02: 50 ug via INTRAVENOUS

## 2022-04-02 MED ORDER — PHENYLEPHRINE 80 MCG/ML (10ML) SYRINGE FOR IV PUSH (FOR BLOOD PRESSURE SUPPORT)
PREFILLED_SYRINGE | INTRAVENOUS | Status: DC | PRN
  Administered 2022-04-02 (×2): 80 ug via INTRAVENOUS
  Administered 2022-04-02 (×2): 160 ug via INTRAVENOUS
  Administered 2022-04-02: 80 ug via INTRAVENOUS

## 2022-04-02 MED ORDER — PHENYLEPHRINE 80 MCG/ML (10ML) SYRINGE FOR IV PUSH (FOR BLOOD PRESSURE SUPPORT)
PREFILLED_SYRINGE | INTRAVENOUS | Status: AC
  Filled 2022-04-02: qty 10

## 2022-04-02 MED ORDER — DEXAMETHASONE SODIUM PHOSPHATE 10 MG/ML IJ SOLN
INTRAMUSCULAR | Status: DC | PRN
  Administered 2022-04-02: 10 mg via INTRAVENOUS

## 2022-04-02 SURGICAL SUPPLY — 60 items
APPLIER CLIP 11 MED OPEN (CLIP) ×2
APPLIER CLIP 13 LRG OPEN (CLIP) ×4
BAG COUNTER SPONGE SURGICOUNT (BAG) ×1 IMPLANT
BIOPATCH RED 1 DISK 7.0 (GAUZE/BANDAGES/DRESSINGS) IMPLANT
BLADE CLIPPER SURG (BLADE) IMPLANT
CANISTER SUCT 3000ML PPV (MISCELLANEOUS) ×1 IMPLANT
CHLORAPREP W/TINT 26 (MISCELLANEOUS) ×1 IMPLANT
CLIP APPLIE 11 MED OPEN (CLIP) IMPLANT
CLIP APPLIE 13 LRG OPEN (CLIP) IMPLANT
COVER SURGICAL LIGHT HANDLE (MISCELLANEOUS) ×1 IMPLANT
DRAIN CHANNEL 19F RND (DRAIN) IMPLANT
DRAPE LAPAROSCOPIC ABDOMINAL (DRAPES) ×1 IMPLANT
DRSG OPSITE POSTOP 4X12 (GAUZE/BANDAGES/DRESSINGS) IMPLANT
DRSG TEGADERM 4X4.5 CHG (GAUZE/BANDAGES/DRESSINGS) IMPLANT
ELECT BLADE 4.0 EZ CLEAN MEGAD (MISCELLANEOUS) ×1
ELECT BLADE 6.5 EXT (BLADE) ×1 IMPLANT
ELECT REM PT RETURN 9FT ADLT (ELECTROSURGICAL) ×1
ELECTRODE BLDE 4.0 EZ CLN MEGD (MISCELLANEOUS) IMPLANT
ELECTRODE REM PT RTRN 9FT ADLT (ELECTROSURGICAL) ×1 IMPLANT
EVACUATOR SILICONE 100CC (DRAIN) IMPLANT
GAUZE SPONGE 4X4 12PLY STRL (GAUZE/BANDAGES/DRESSINGS) ×1 IMPLANT
GLOVE BIO SURGEON STRL SZ8 (GLOVE) ×1 IMPLANT
GLOVE BIOGEL PI IND STRL 8 (GLOVE) ×1 IMPLANT
GOWN STRL REUS W/ TWL LRG LVL3 (GOWN DISPOSABLE) ×3 IMPLANT
GOWN STRL REUS W/ TWL XL LVL3 (GOWN DISPOSABLE) ×1 IMPLANT
GOWN STRL REUS W/TWL LRG LVL3 (GOWN DISPOSABLE) ×3
GOWN STRL REUS W/TWL XL LVL3 (GOWN DISPOSABLE) ×1
HANDLE SUCTION POOLE (INSTRUMENTS) ×1 IMPLANT
HEMOSTAT ARISTA ABSORB 3G PWDR (HEMOSTASIS) IMPLANT
HEMOSTAT HEMOBLAST BELLOWS (HEMOSTASIS) IMPLANT
KIT BASIN OR (CUSTOM PROCEDURE TRAY) ×1 IMPLANT
KIT TURNOVER KIT B (KITS) ×1 IMPLANT
LIGASURE IMPACT 36 18CM CVD LR (INSTRUMENTS) ×1 IMPLANT
NS IRRIG 1000ML POUR BTL (IV SOLUTION) ×2 IMPLANT
PACK GENERAL/GYN (CUSTOM PROCEDURE TRAY) ×1 IMPLANT
PAD ARMBOARD 7.5X6 YLW CONV (MISCELLANEOUS) ×1 IMPLANT
PENCIL SMOKE EVACUATOR (MISCELLANEOUS) ×1 IMPLANT
SPECIMEN JAR X LARGE (MISCELLANEOUS) ×1 IMPLANT
SPONGE INTESTINAL PEANUT (DISPOSABLE) IMPLANT
SPONGE SURGIFOAM ABS GEL 100 (HEMOSTASIS) IMPLANT
SPONGE T-LAP 18X18 ~~LOC~~+RFID (SPONGE) IMPLANT
SPONGE T-LAP 18X36 ~~LOC~~+RFID STR (SPONGE) IMPLANT
STAPLER VISISTAT 35W (STAPLE) ×1 IMPLANT
SUCTION POOLE HANDLE (INSTRUMENTS) ×1
SUT ETHILON 2 0 FS 18 (SUTURE) IMPLANT
SUT PDS AB 1 CTX 36 (SUTURE) IMPLANT
SUT PDS AB 1 TP1 96 (SUTURE) ×2 IMPLANT
SUT SILK 0 TIES 10X30 (SUTURE) ×1 IMPLANT
SUT SILK 2 0 SH (SUTURE) ×2 IMPLANT
SUT SILK 2 0 TIES 10X30 (SUTURE) ×1 IMPLANT
SUT SILK 2 0SH CR/8 30 (SUTURE) ×1 IMPLANT
SUT VIC AB 2-0 CT1 27 (SUTURE) ×3
SUT VIC AB 2-0 CT1 TAPERPNT 27 (SUTURE) ×1 IMPLANT
SUT VIC AB 2-0 SH 18 (SUTURE) IMPLANT
SUT VIC AB 2-0 SH 27 (SUTURE) ×5
SUT VIC AB 2-0 SH 27XBRD (SUTURE) IMPLANT
SUT VIC AB 3-0 SH 18 (SUTURE) ×1 IMPLANT
TOWEL GREEN STERILE (TOWEL DISPOSABLE) ×1 IMPLANT
TOWEL GREEN STERILE FF (TOWEL DISPOSABLE) ×1 IMPLANT
TRAY FOLEY MTR SLVR 14FR STAT (SET/KITS/TRAYS/PACK) ×1 IMPLANT

## 2022-04-02 NOTE — Transfer of Care (Signed)
Immediate Anesthesia Transfer of Care Note  Patient: [facility]  Procedure(s) Performed: OPEN SPLENECTOMY  Patient Location: PACU  Anesthesia Type:GA combined with regional for post-op pain  Level of Consciousness: awake, drowsy, and patient cooperative  Airway & Oxygen Therapy: Patient Spontanous Breathing and Patient connected to nasal cannula oxygen  Post-op Assessment: Report given to RN, Post -op Vital signs reviewed and stable, and Patient moving all extremities X 4  Post vital signs: Reviewed and stable  Last Vitals:  Vitals Value Taken Time  BP 126/59 04/02/22 1055  Temp    Pulse 106 04/02/22 1059  Resp 26 04/02/22 1059  SpO2 97 % 04/02/22 1059  Vitals shown include unvalidated device data.  Last Pain:  Vitals:   04/02/22 0623  TempSrc:   PainSc: 0-No pain         Complications: No notable events documented.

## 2022-04-02 NOTE — Interval H&P Note (Signed)
History and Physical Interval Note:  04/02/2022 7:13 AM  Eileen [name]  has presented today for surgery, with the diagnosis of SPLENOMEGALY.  The various methods of treatment have been discussed with the patient and family. After consideration of risks, benefits and other options for treatment, the patient has consented to  Procedure(s) with comments: OPEN SPLENECTOMY (N/A) - TAP BLOCK as a surgical intervention.  The patient's history has been reviewed, patient examined, no change in status, stable for surgery.  I have reviewed the patient's chart and labs.  Questions were answered to the patient's satisfaction.     [place]

## 2022-04-02 NOTE — Anesthesia Procedure Notes (Signed)
Procedure Name: Intubation Date/Time: 04/02/2022 7:47 AM  Performed by: Gaylene Brooks, CRNAPre-anesthesia Checklist: Patient identified, Emergency Drugs available, Suction available and Patient being monitored Patient Re-evaluated:Patient Re-evaluated prior to induction Oxygen Delivery Method: Circle System Utilized Preoxygenation: Pre-oxygenation with 100% oxygen Induction Type: IV induction Ventilation: Mask ventilation without difficulty Laryngoscope Size: Miller and 2 Grade View: Grade I Tube type: Oral Tube size: 7.0 mm Number of attempts: 1 Airway Equipment and Method: Stylet and Oral airway Placement Confirmation: ETT inserted through vocal cords under direct vision, positive ETCO2 and breath sounds checked- equal and bilateral Secured at: 21 cm Tube secured with: Tape Dental Injury: Teeth and Oropharynx as per pre-operative assessment

## 2022-04-02 NOTE — H&P (Signed)
History of Present Illness: Eileen [name] is a 51 y.o. female who is seen today as an office consultation for evaluation of New Consultation .   Patient presents at the request of Dr. Aris Lot of hematology due to splenomegaly. She was admitted approximately 3 weeks ago to the hospital with left upper quadrant abdominal pain. 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 She was admitted and anticoagulated. She was discharged home on Eliquis. Her pain is much better controlled now. 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 No history of GI bleeding. This is her first episode like this. Her pain is much better controlled than it was in the hospital she states. She also has early satiety.  Review of Systems: A complete review of systems was obtained from the patient. I have reviewed this information and discussed as appropriate with the patient. See HPI as well for other ROS.    Medical History: Past Medical History:  Diagnosis Date  DVT (deep venous thrombosis) (CMS-HCC)  GERD (gastroesophageal reflux disease)   There is no problem list on file for this patient.  Past Surgical History:  Procedure Laterality Date  CESAREAN SECTION  CHOLECYSTECTOMY  HYSTERECTOMY    No Known Allergies  Current Outpatient Medications on File Prior to Visit  Medication Sig Dispense Refill  apixaban (ELIQUIS) 5 mg tablet Take by mouth  gabapentin (NEURONTIN) 100 MG capsule Take 100 mg by mouth at bedtime  JAKAFI 10 mg tablet Take 1 tablet by mouth 2 (two) times daily  ondansetron (ZOFRAN) 8 MG tablet Take by mouth  acetaminophen (TYLENOL) 500 MG tablet Take by mouth  omeprazole (PRILOSEC OTC) 20 MG EC tablet Take 20 mg by mouth once daily  oxyCODONE (OXYIR) 10 mg immediate release tablet Take 10 mg by mouth every 6 (six) hours as needed   No current  facility-administered medications on file prior to visit.   No family history on file.   Social History   Tobacco Use  Smoking Status Never  Smokeless Tobacco Never    Social History   Socioeconomic History  Marital status: Married  Tobacco Use  Smoking status: Never  Smokeless tobacco: Never  Substance and Sexual Activity  Alcohol use: Not Currently  Drug use: Never   Objective:   Vitals:  03/18/22 1129  BP: (!) 130/90  Pulse: (!) 118  Temp: 36.9 C (98.4 F)  SpO2: 98%  Weight: (!) 101.7 kg (224 lb 3.2 oz)  Height: 160 cm ('5\' 3"'$ )   Body mass index is 39.72 kg/m.  Physical Exam HENT:  Head: Normocephalic.  Cardiovascular:  Rate and Rhythm: Tachycardia present.  Pulmonary:  Effort: Pulmonary effort is normal.  Breath sounds: Normal breath sounds.  Abdominal:  General: Abdomen is protuberant.  Palpations: Abdomen is soft. There is splenomegaly and mass. There is no fluid wave, hepatomegaly or pulsatile mass.  Tenderness: There is no abdominal tenderness.   Comments: Laparoscopic scars noted.  Musculoskeletal:  General: Normal range of motion.  Cervical back: Normal range of motion.  Skin: General: Skin is warm and dry.  Neurological:  General: No focal deficit present.  Mental Status: She is alert.  Psychiatric:  Mood and Affect: Mood normal.  Behavior: Behavior normal.     Labs, Imaging and Diagnostic Testing:  CLINICAL DATA: Acute abdominal pain. Left upper quadrant mass, spleen?. Radiologic records indicates history of polycythemia vera.  EXAM: CT ABDOMEN AND PELVIS WITH CONTRAST  TECHNIQUE: Multidetector CT imaging of the abdomen and pelvis was performed using the standard protocol following bolus administration of intravenous contrast.  RADIATION DOSE REDUCTION: This exam was performed according to the departmental dose-optimization program which includes automated exposure control, adjustment of the mA and/or kV according to patient  size and/or use of iterative reconstruction technique.  CONTRAST: 53m OMNIPAQUE IOHEXOL 350 MG/ML SOLN  COMPARISON: Abdominopelvic CT 07/07/2020  FINDINGS: Lower chest: Assessed on concurrent chest CT, reported separately.  Hepatobiliary: Suggestion of mild hepatic steatosis. Focal fatty infiltration adjacent to the falciform ligament. There is thrombus within the intrahepatic and extrahepatic portal vein. Adjacent perivascular fat stranding. No discrete focal hepatic lesion. No convincing capsular nodularity. Cholecystectomy. The common bile duct is poorly defined on the current exam, but appears nondilated.  Pancreas: Fatty atrophy. There is perivascular stranding adjacent to occluded portal and splenic veins, but no definite peripancreatic inflammation.  Spleen: Massive splenomegaly with progression from prior exam. The spleen measures 19.3 x 7.4 x 23 cm (volume = 1700 cm^3). Wedge-shaped area of decreased attenuation within the anterior inferior aspect of the spleen typical of splenic infarct. The splenic vein is occluded.  Adrenals/Urinary Tract: Normal adrenal glands. No hydronephrosis or perinephric edema. Homogeneous renal enhancement with symmetric excretion on delayed phase imaging. No renal calculi or evidence of solid lesion. Urinary bladder is physiologically distended without wall thickening.  Stomach/Bowel: Detailed bowel assessment is limited in the absence of enteric contrast and presence of ascites. Partially distended stomach. No small bowel obstruction, inflammation, pneumatosis or abnormal distention. Normal appendix. There is submucosal fatty infiltration throughout the colon consistent with chronic or prior inflammation. No acute inflammatory change.  Vascular/Lymphatic: There is occlusion of the splenic, intra and extrahepatic portal vein, and likely the the upper aspect of superior mesenteric vein. Few venous collaterals in the upper abdomen. Normal  caliber abdominal aorta no suspicious adenopathy.  Reproductive: Status post hysterectomy. No adnexal masses.  Other: Small volume of abdominopelvic ascites, measuring simple fluid density. No free air. Tiny fat containing umbilical hernia.  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 No acute osseous findings.  IMPRESSION: 1. Massive splenomegaly with progression from prior exam. Wedge-shaped area of decreased attenuation within the anterior inferior aspect of the spleen typical of splenic infarct. 2. Occlusion of the splenic, intra and extrahepatic portal veins, and likely the upper aspect of superior mesenteric vein. 3. Small volume of abdominopelvic ascites. 4. Mild hepatic steatosis. 5. Submucosal fatty infiltration throughout the colon consistent with chronic or prior inflammation. No acute inflammatory change. 6. 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  These results were called by telephone at the time of interpretation on 02/27/2022 at 5:36 pm [facility] who verbally acknowledged these results.   Electronically Signed By: MKeith RakeM.D. On: 02/27/2022 17:36 Assessment and Plan:   Diagnoses and all orders for this visit:  Splenomegaly  Splenic infarct  Polycythemia vera (CMS-HCC)   Discussion about treatment options. She does have signs of infarction at this point and massively enlarged spleen. She would not be a good laparoscopic candidate which we reviewed today. I discussed open splenectomy with more than likely a left subcostal incision. Discussed the risk of bleeding, infection, splenic tail injury, injury to diaphragm, injury to bowel, injury to other neighboring structures as well as propagation of clot with potential risk  of worsening of portal hypertension and collateralization could lead to excessive  bleeding requiring blood transfusion and rarely death. There are no other good options since embolization will be largely ineffective against the spleen is large nor could a laparoscopic approach to be done since the extraction port would be be as big as an open incision. We discussed post splenectomy immunizations and she is to see her PCP tomorrow and I recommended that she discuss this with her PCP to get her immunizations prior to surgery if able. This will be done hopefully 2 weeks from the standpoint of surgery scheduling to be done at the hospital with expected overnight stay as well as consultation with the medical service to help manage her anticoagulation postoperatively. We will get clearance from her medical oncologist as well to proceed with splenectomy in gain assistance with management of her anticoagulation. Reviewed the risk of bleeding, infection, death, DVT, propagation of clot, potential bowel loss if that happens, hernia, injury to neighboring structures, cardiovascular event embolic event and the need for treatment standard procedures.  No follow-ups on file.  Kennieth Francois, MD

## 2022-04-02 NOTE — Op Note (Addendum)
Preoperative diagnosis: Splenomegaly secondary to polycythemia vera with splenic infarct and chronic pain  Postoperative diagnosis: Same  Procedure: Open splenectomy  Surgeon: Erroll Luna, MD   Assistant Ewell Poe, RNFA   Anesthesia: General with transversus abdominis plane block by anesthesia  EBL: 2200 cc  IV fluids: 2 units packed RBCs +2000 crystalloid  Specimen: Spleen to pathology  Drains: 19 round to tail of pancreas region  Indications for procedure: The patient is a 51 year old female with a history of polycythemia vera who is developed massive splenomegaly and actually thrombosis of her splenic vein, SMV and part of her portal vein.  She also has multiple splenic infarction intractable pain.  She was seen in consultation and her scan reviewed.  She is on chronic anticoagulation for the above but that was held for surgery.  We talked about open splenectomy.  She was not a candidate for a laparoscopic approach due to her massive size spleen and concern for bleeding from collateral vessels given her S MV thrombus, splenic vein thrombus and potential propagation of the portal vein.  We discussed increased risk of bleeding, death  infection, propagation of clot leading to potential outflow ischemia, injury to staving structures, injury to the pancreas, possible resection of the tail the pancreas, pancreatic leak, injury to the stomach, possible perforation stomach, and the need for other treatments and/or procedures.  We also discussed COMPLICATING . features of anesthesia, pulmonary, cardiovascular, and bleeding issues especially with her need for postoperative chronic anticoagulation.  We discussed nonoperative measures but due to the massive side of size of her spleen and the infarcts that she was sustaining, concern was over time she would develop splenic abscess or if she were to fall have a massive splenic rupture which could be potentially fatal given her circumstances.   After discussion of the above the pros and cons she agreed to proceed with surgery.   Description of procedure: The patient was met in the holding area and questions were answered.  A transversus abdominis plane block was administered by anesthesia.  She was then taken back to the operative room.  She is placed supine upon the OR table.  An A-line was placed for intraoperative management due to potential blood loss with procedure.  She had blood available as well.  After induction of general esthesia, she was prepped and draped in sterile fashion timeout performed.  Of note a Foley cath was placed under sterile conditions well.  After performance of timeout she received appropriate antibiotics prior to that.  A left subcostal incision was made.  This was made 2 fingerbreadths below the left costal margin.  Dissection was carried down through the subcutaneous fascia until the muscles of the abdomen were encountered.  The rectus muscle was taken in layers as well as the external oblique.  This revealed the transversus abdominis plane and these muscles were opened as well with cautery.  Upon entering the abdominal cavity she had a moderate amount of ascites which was suctioned out.  A Bookwalter retractor was placed.  She was placed in reverse Trendelenburg and rolled to her right.  The spleen was massively enlarged with multiple infarcts.  It was adherent to the transverse colon as well as the stomach and pancreas due to the multiple infarcts and chronic inflammatory change noted.  There were multiple large collateral veins.  We carefully dissected out the hilum using the LigaSure, clips in 2 oh ligatures of silk and Vicryl.  The spleen was massively enlarged show we tried  to mainly mobilize this is much as we could up into the wound.  There was some bleeding from the friable capsule and I opted to go ahead and clamp across the hilum due to ongoing blood loss to get control of this.  Once I did this with a large  Kelly clamp I divided the remnants of the splenic artery and splenic vein as they exit the tail of the pancreas with a clamp was down on the pancreas since there was significant inflammation and oozing.  The entire spleen was removed and passed off the field.  This created significant space that we could now see better.  I then oversewed small bleeding vessels to the tail the pancreas with Vicryl suture 2 oh and silk suture.  There were areas where the short gastrics were on the stomach and these were oversewn as well.  The LigaSure was used to take these down but due to her need for anticoagulation we did not oversewed these.  There is significant inflammatory change to the splenic fossa region posteriorly.  There were multiple venous collaterals that were using as well.  This was all controlled with cautery, suture ligation and pressure.  The stomach was examined.  There is an area where the short gastric were taken down oversewed this stomach wall with interrupted 2-0 silk sutures.  There is no full-thickness injury to the stomach that I can see.  Transverse colon was carefully examined and there is no injury to that as well.  There are some chronic inflammatory changes to it noted but no injury from the procedure that I could see.  Tail the pancreas was carefully examined.  It appeared to be intact but there is significant inflammation and bleeding from the tail the pancreas.  We controlled this with combination of 2-0 silk suture, 2-0 Vicryl suture, and then applied hemoblast to it and held pressure for 5 minutes.  We also used Heema shield as well on this to help ensure hemostasis due to the oozing.  Arista was applied finally as well as a third layer.  This seemed to give [place] good hemostasis with no ongoing bleeding that we could see.  Pressure was held carefully for well over 20 minutes and this was carefully examined.  Of note she will require postop anticoagulation therefore time was taken to ensure  adequate hemostasis.  This was observed and there is no more ongoing bleeding that I could see.  Prior to this we did irrigate.  Through a separate stab incision a 19 round drain was placed at the tail the pancreas.  A nasogastric tube was placed in good position.  All packs were removed.  There is no evidence of any injury to stomach or colon.  We then closed the abdominal wall in layers using #1 running PDS.  Skin staples were used to close the skin.  Drain was sutured with 2-0 nylon.  Honeycomb dressing placed.  All counts found to be correct.  The patient was then awoke extubated taken to recovery in satisfactory condition.

## 2022-04-02 NOTE — Anesthesia Procedure Notes (Signed)
Anesthesia Regional Block: TAP block   Pre-Anesthetic Checklist: , timeout performed,  Correct Patient, Correct Site, Correct Laterality,  Correct Procedure, Correct Position, site marked,  Risks and benefits discussed,  Surgical consent,  Pre-op evaluation,  At surgeon's request and post-op pain management  Laterality: Left  Prep: Dura Prep       Needles:  Injection technique: Single-shot  Needle Type: Echogenic Stimulator Needle     Needle Length: 10cm  Needle Gauge: 20     Additional Needles:   Procedures:,,,, ultrasound used (permanent image in chart),,    Narrative:  Start time: 04/02/2022 7:20 AM End time: 04/02/2022 7:22 AM Injection made incrementally with aspirations every 5 mL.  Performed by: Personally  Anesthesiologist: Darral Dash, DO  Additional Notes: Patient identified. Risks/Benefits/Options discussed with patient including but not limited to bleeding, infection, nerve damage, failed block, incomplete pain control. Patient expressed understanding and wished to proceed. All questions were answered. Sterile technique was used throughout the entire procedure. Please see nursing notes for vital signs. Aspirated in 5cc intervals with injection for negative confirmation. Patient was given instructions on fall risk and not to get out of bed. All questions and concerns addressed with instructions to call with any issues or inadequate analgesia.

## 2022-04-03 ENCOUNTER — Encounter (HOSPITAL_COMMUNITY): Payer: Self-pay | Admitting: Surgery

## 2022-04-03 LAB — COMPREHENSIVE METABOLIC PANEL
ALT: 11 U/L (ref 0–44)
AST: 24 U/L (ref 15–41)
Albumin: 2.8 g/dL — ABNORMAL LOW (ref 3.5–5.0)
Alkaline Phosphatase: 72 U/L (ref 38–126)
Anion gap: 8 (ref 5–15)
BUN: 7 mg/dL (ref 6–20)
CO2: 22 mmol/L (ref 22–32)
Calcium: 8.7 mg/dL — ABNORMAL LOW (ref 8.9–10.3)
Chloride: 107 mmol/L (ref 98–111)
Creatinine, Ser: 0.7 mg/dL (ref 0.44–1.00)
GFR, Estimated: >60 mL/min (ref >60)
Glucose, Bld: 147 mg/dL — ABNORMAL HIGH (ref 70–99)
Potassium: 4.2 mmol/L (ref 3.5–5.1)
Sodium: 137 mmol/L (ref 135–145)
Total Bilirubin: 0.5 mg/dL (ref 0.3–1.2)
Total Protein: 5.6 g/dL — ABNORMAL LOW (ref 6.5–8.1)

## 2022-04-03 LAB — CBC
HCT: 34 % — ABNORMAL LOW (ref 36.0–46.0)
Hemoglobin: 10.6 g/dL — ABNORMAL LOW (ref 12.0–15.0)
MCH: 24.2 pg — ABNORMAL LOW (ref 26.0–34.0)
MCHC: 31.2 g/dL (ref 30.0–36.0)
MCV: 77.6 fL — ABNORMAL LOW (ref 80.0–100.0)
Platelets: 650 10*3/uL — ABNORMAL HIGH (ref 150–400)
RBC: 4.38 MIL/uL (ref 3.87–5.11)
RDW: 24.5 % — ABNORMAL HIGH (ref 11.5–15.5)
WBC: 31 10*3/uL — ABNORMAL HIGH (ref 4.0–10.5)
nRBC: 0.1 % (ref 0.0–0.2)

## 2022-04-03 MED ORDER — SODIUM CHLORIDE 0.9 % IV BOLUS
1000.0000 mL | Freq: Once | INTRAVENOUS | Status: AC
  Administered 2022-04-03: 1000 mL via INTRAVENOUS

## 2022-04-03 MED ORDER — APIXABAN 5 MG PO TABS
5.0000 mg | ORAL_TABLET | Freq: Two times a day (BID) | ORAL | Status: DC
  Administered 2022-04-04 – 2022-04-05 (×3): 5 mg via ORAL
  Filled 2022-04-03 (×3): qty 1

## 2022-04-03 NOTE — Anesthesia Postprocedure Evaluation (Signed)
Anesthesia Post Note  Patient: Eileen [name] [facility]  Procedure(s) Performed: OPEN SPLENECTOMY     Patient location during evaluation: PACU Anesthesia Type: Regional and General Level of consciousness: awake and alert Pain management: pain level controlled Vital Signs Assessment: post-procedure vital signs reviewed and stable Respiratory status: spontaneous breathing, nonlabored ventilation, respiratory function stable and patient connected to nasal cannula oxygen Cardiovascular status: blood pressure returned to baseline and stable Postop Assessment: no apparent nausea or vomiting Anesthetic complications: no   No notable events documented.  Last Vitals:  Vitals:   04/03/22 1736 04/03/22 1913  BP: 117/65   Pulse: 99   Resp: 18 17  Temp: 36.8 C   SpO2: 100% 95%    Last Pain:  Vitals:   04/03/22 1913  TempSrc:   PainSc: 4                  [place] P Maureena Dabbs

## 2022-04-03 NOTE — Progress Notes (Signed)
Pt unable to urinate. Bladder scan showed 10pm= 9 ml, 3:23 am = 156 ml and 5am=211 ml. Made Dr. Grandville Silos aware. 1059m bolus of NS has been ordered and given.

## 2022-04-03 NOTE — Progress Notes (Signed)
1 Day Post-Op   Subjective/Chief Complaint: Pain better controlled today    Objective: Vital signs in last 24 hours: Temp:  [97.2 F (36.2 C)-98.4 F (36.9 C)] 98.4 F (36.9 C) (10/25 0057) Pulse Rate:  [84-117] 84 (10/25 0506) Resp:  [14-26] 18 (10/25 0506) BP: (108-139)/(57-86) 127/80 (10/25 0506) SpO2:  [95 %-98 %] 95 % (10/25 0506) FiO2 (%):  [0 %] 0 % (10/24 1147) Last BM Date : 04/01/22  Intake/Output from previous day: 10/24 0701 - 10/25 0700 In: 4233.3 [P.O.:240; I.V.:2703.3; Blood:630; IV Piggyback:660] Out: 2995 [Urine:305; Emesis/NG output:200; Drains:290; Blood:2200] Intake/Output this shift: No intake/output data recorded.  General appearance: alert and cooperative Resp: clear to auscultation bilaterally Cardio: NSR Incision/Wound:CDI honeycomb in place dry ND JP serous 190 cc   Lab Results:  Recent Labs    04/02/22 0553 04/02/22 1117 04/03/22 0522  WBC 8.6  --  31.0*  HGB 10.7* 11.6* 10.6*  HCT 36.4 38.3 34.0*  PLT 286  --  650*   BMET Recent Labs    04/02/22 0553 04/03/22 0522  NA 136 137  K 3.5 4.2  CL 106 107  CO2 22 22  GLUCOSE 150* 147*  BUN 7 7  CREATININE 0.84 0.70  CALCIUM 9.1 8.7*   PT/INR No results for input(s): "LABPROT", "INR" in the last 72 hours. ABG No results for input(s): "PHART", "HCO3" in the last 72 hours.  Invalid input(s): "PCO2", "PO2"  Studies/Results: No results found.  Anti-infectives: Anti-infectives (From admission, onward)    Start     Dose/Rate Route Frequency Ordered Stop   04/02/22 1445  ceFAZolin (ANCEF) IVPB 2g/100 mL premix        2 g 200 mL/hr over 30 Minutes Intravenous Every 8 hours 04/02/22 1346 04/02/22 1555   04/02/22 0600  ceFAZolin (ANCEF) IVPB 3g/100 mL premix        3 g 200 mL/hr over 30 Minutes Intravenous On call to O.R. 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

## 2022-04-03 NOTE — Progress Notes (Signed)
NGT output turned from yellow to brown coffee ground like output. MD on call made aware. No new order at this time.

## 2022-04-03 NOTE — Progress Notes (Signed)
Mobility Specialist - Progress Note   04/03/22 1500  Mobility  Activity Ambulated with assistance in hallway  Level of Assistance Standby assist, set-up cues, supervision of patient - no hands on  Assistive Device Front wheel walker  Distance Ambulated (ft) 200 ft  Activity Response Tolerated well  $Mobility charge 1 Mobility    Pt received in bed agreeable to mobility. Left in bed w/ call bell and all needs met.   Sydney Joyce Mobility Specialist  

## 2022-04-04 LAB — CBC
HCT: 31.3 % — ABNORMAL LOW (ref 36.0–46.0)
Hemoglobin: 9.4 g/dL — ABNORMAL LOW (ref 12.0–15.0)
MCH: 23.9 pg — ABNORMAL LOW (ref 26.0–34.0)
MCHC: 30 g/dL (ref 30.0–36.0)
MCV: 79.4 fL — ABNORMAL LOW (ref 80.0–100.0)
Platelets: 677 10*3/uL — ABNORMAL HIGH (ref 150–400)
RBC: 3.94 MIL/uL (ref 3.87–5.11)
RDW: 25 % — ABNORMAL HIGH (ref 11.5–15.5)
WBC: 37.5 10*3/uL — ABNORMAL HIGH (ref 4.0–10.5)
nRBC: 0.2 % (ref 0.0–0.2)

## 2022-04-04 LAB — SURGICAL PATHOLOGY

## 2022-04-04 LAB — AMYLASE, PLEURAL OR PERITONEAL FLUID: Amylase, Fluid: 8 U/L

## 2022-04-04 MED ORDER — HYDROMORPHONE HCL 1 MG/ML IJ SOLN
1.0000 mg | INTRAMUSCULAR | Status: DC | PRN
  Administered 2022-04-04: 1 mg via INTRAVENOUS
  Filled 2022-04-04: qty 1

## 2022-04-04 MED ORDER — METHOCARBAMOL 500 MG PO TABS
500.0000 mg | ORAL_TABLET | Freq: Four times a day (QID) | ORAL | Status: DC | PRN
  Administered 2022-04-05: 500 mg via ORAL
  Filled 2022-04-04 (×2): qty 1

## 2022-04-04 MED ORDER — OXYCODONE HCL 5 MG PO TABS
10.0000 mg | ORAL_TABLET | Freq: Four times a day (QID) | ORAL | Status: DC | PRN
  Administered 2022-04-04 – 2022-04-05 (×5): 10 mg via ORAL
  Filled 2022-04-04 (×5): qty 2

## 2022-04-04 NOTE — Progress Notes (Signed)
Waste 10 cc of dilaudid with Jasmin RN from the PCA pump. Pt has call button and phone in reach.

## 2022-04-04 NOTE — Progress Notes (Signed)
Mobility Specialist - Progress Note   04/04/22 1200  Mobility  Activity Ambulated with assistance in hallway  Level of Assistance Modified independent, requires aide device or extra time  Assistive Device Front wheel walker  Distance Ambulated (ft) 300 ft  Activity Response Tolerated well  $Mobility charge 1 Mobility    Pt received in recliner agreeable to mobility. Left in recliner w/ call bell and all needs met.   Paulla Dolly Mobility Specialist

## 2022-04-04 NOTE — Progress Notes (Signed)
2 Days Post-Op   Subjective/Chief Complaint: Doing well.  Pain controlled.  Tolerating diet.   Objective: Vital signs in last 24 hours: Temp:  [98.2 F (36.8 C)-98.5 F (36.9 C)] 98.5 F (36.9 C) (10/26 0756) Pulse Rate:  [95-99] 95 (10/26 0756) Resp:  [15-21] 17 (10/26 0756) BP: (116-118)/(58-67) 116/66 (10/26 0756) SpO2:  [95 %-100 %] 99 % (10/26 0756) Last BM Date : 04/01/22  Intake/Output from previous day: 10/25 0701 - 10/26 0700 In: 2534.6 [I.V.:2259.1; IV Piggyback:275.6] Out: 128 [Drains:128] Intake/Output this shift: No intake/output data recorded.  General appearance: alert and cooperative Cardio: Normal sinus rhythm GI: Soft nontender.  JP drain serous.  Incision clean dry intact Incision/Wound: Incision clean dry intact clinical in place  Lab Results:  Recent Labs    04/03/22 0522 04/04/22 0327  WBC 31.0* 37.5*  HGB 10.6* 9.4*  HCT 34.0* 31.3*  PLT 650* 677*   BMET Recent Labs    04/02/22 0553 04/03/22 0522  NA 136 137  K 3.5 4.2  CL 106 107  CO2 22 22  GLUCOSE 150* 147*  BUN 7 7  CREATININE 0.84 0.70  CALCIUM 9.1 8.7*   PT/INR No results for input(s): "LABPROT", "INR" in the last 72 hours. ABG No results for input(s): "PHART", "HCO3" in the last 72 hours.  Invalid input(s): "PCO2", "PO2"  Studies/Results: No results found.  Anti-infectives: Anti-infectives (From admission, onward)    Start     Dose/Rate Route Frequency Ordered Stop   04/02/22 1445  ceFAZolin (ANCEF) IVPB 2g/100 mL premix        2 g 200 mL/hr over 30 Minutes Intravenous Every 8 hours 04/02/22 1346 04/02/22 1555   04/02/22 0600  ceFAZolin (ANCEF) IVPB 3g/100 mL premix        3 g 200 mL/hr over 30 Minutes Intravenous On call to O.R. 04/02/22 0551 04/02/22 0757       Assessment/Plan: s/p Procedure(s) with comments: OPEN SPLENECTOMY (N/A) - TAP BLOCK Restart Eliquis today  Advance diet  DC PCA  Decrease IV fluids to 50 cc an hour  Ambulate  Send JP  drainage for amylase  LOS: 2 days    Turner Daniels MD 04/04/2022

## 2022-04-05 LAB — CBC
HCT: 30.6 % — ABNORMAL LOW (ref 36.0–46.0)
Hemoglobin: 9.7 g/dL — ABNORMAL LOW (ref 12.0–15.0)
MCH: 25.2 pg — ABNORMAL LOW (ref 26.0–34.0)
MCHC: 31.7 g/dL (ref 30.0–36.0)
MCV: 79.5 fL — ABNORMAL LOW (ref 80.0–100.0)
Platelets: UNDETERMINED 10*3/uL (ref 150–400)
RBC: 3.85 MIL/uL — ABNORMAL LOW (ref 3.87–5.11)
RDW: 24 % — ABNORMAL HIGH (ref 11.5–15.5)
WBC: 24.9 10*3/uL — ABNORMAL HIGH (ref 4.0–10.5)
nRBC: 0.5 % — ABNORMAL HIGH (ref 0.0–0.2)

## 2022-04-05 MED ORDER — METHOCARBAMOL 500 MG PO TABS: 500.0000 mg | ORAL_TABLET | Freq: Four times a day (QID) | ORAL | 0 refills | Status: DC | PRN

## 2022-04-05 MED ORDER — ACETAMINOPHEN 500 MG PO TABS
1000.0000 mg | ORAL_TABLET | Freq: Four times a day (QID) | ORAL | Status: DC
  Administered 2022-04-05: 1000 mg via ORAL
  Filled 2022-04-05: qty 2

## 2022-04-05 MED ORDER — SIMETHICONE 80 MG PO CHEW: 80.0000 mg | CHEWABLE_TABLET | Freq: Four times a day (QID) | ORAL | Status: DC | PRN

## 2022-04-05 MED ORDER — OXYCODONE HCL 10 MG PO TABS: 10.0000 mg | ORAL_TABLET | Freq: Four times a day (QID) | ORAL | 0 refills | Status: DC | PRN

## 2022-04-05 NOTE — Progress Notes (Signed)
Mobility Specialist - Progress Note   04/05/22 1550  Mobility  Activity Ambulated independently in hallway  Level of Assistance Independent  Assistive Device None  Distance Ambulated (ft) 450 ft  Activity Response Tolerated well  $Mobility charge 1 Mobility    Pt received in bed agreeable to mobility. Left in bed w/ call bell in reach and all needs met.   Paulla Dolly Mobility Specialist

## 2022-04-05 NOTE — Discharge Instructions (Signed)
[facility]      [facility] [phone]  OPEN ABDOMINAL SURGERY: POST OP INSTRUCTIONS  Always review your discharge instruction sheet given to you by the facility where your surgery was performed.  IF YOU HAVE DISABILITY OR FAMILY LEAVE FORMS, YOU MUST BRING THEM TO THE OFFICE FOR PROCESSING.  PLEASE DO NOT GIVE THEM TO YOUR DOCTOR.  A prescription for pain medication may be given to you upon discharge.  Take your pain medication as prescribed, if needed.  If narcotic pain medicine is not needed, then you may take acetaminophen (Tylenol) or ibuprofen (Advil) as needed. Take your usually prescribed medications unless otherwise directed. If you need a refill on your pain medication, please contact your pharmacy. They will contact our office to request authorization.  Prescriptions will not be filled after 5pm or on week-ends. You should follow a light diet the first few days after arrival home, such as soup and crackers, pudding, etc.unless your doctor has advised otherwise. A high-fiber, low fat diet can be resumed as tolerated.   Be sure to include lots of fluids daily. Most patients will experience some swelling and bruising on the chest and neck area.  Ice packs will help.  Swelling and bruising can take several days to resolve Most patients will experience some swelling and bruising in the area of the incision. Ice pack will help. Swelling and bruising can take several days to resolve..  It is common to experience some constipation if taking pain medication after surgery.  Increasing fluid intake and taking a stool softener will usually help or prevent this problem from occurring.  A mild laxative (Milk of Magnesia or Miralax) should be taken according to package directions if there are no bowel movements after 48 hours.  You may have steri-strips (small skin tapes) in place directly over the incision.  These strips should be left on the skin for 7-10 days.  If your surgeon used skin  glue on the incision, you may shower in 24 hours.  The glue will flake off over the next 2-3 weeks.  Any sutures or staples will be removed at the office during your follow-up visit. You may find that a light gauze bandage over your incision may keep your staples from being rubbed or pulled. You may shower and replace the bandage daily. ACTIVITIES:  You may resume regular (light) daily activities beginning the next day--such as daily self-care, walking, climbing stairs--gradually increasing activities as tolerated.  You may have sexual intercourse when it is comfortable.  Refrain from any heavy lifting or straining until approved by your doctor. You may drive when you no longer are taking prescription pain medication, you can comfortably wear a seatbelt, and you can safely maneuver your car and apply brakes Return to Work: ___________________________________ You should see your doctor in the office for a follow-up appointment approximately two weeks after your surgery.  Make sure that you call for this appointment within a day or two after you arrive home to insure a convenient appointment time. OTHER INSTRUCTIONS:  _____________________________________________________________ _____________________________________________________________  WHEN TO CALL YOUR DOCTOR: Fever over 101.0 Inability to urinate Nausea and/or vomiting Extreme swelling or bruising Continued bleeding from incision. Increased pain, redness, or drainage from the incision. Difficulty swallowing or breathing Muscle cramping or spasms. Numbness or tingling in hands or feet or around lips.  The clinic staff is available to answer your questions during regular business hours.  Please don't hesitate to call and ask to speak to one of   the nurses if you have concerns.  For further questions, please visit [email or web address]  

## 2022-04-05 NOTE — Progress Notes (Signed)
3 Days Post-Op   Subjective/Chief Complaint: Needed IV pain medicine once last night but has been trying to use oral pain medication. Tolerating soft diet. Ambulating with walker due to pain. No respiratory or urinary complaints  Objective: Vital signs in last 24 hours: Temp:  [98.2 F (36.8 C)-98.8 F (37.1 C)] 98.8 F (37.1 C) (10/27 0802) Pulse Rate:  [95-109] 98 (10/27 0802) Resp:  [16-18] 18 (10/27 0802) BP: (106-126)/(61-84) 126/64 (10/27 0802) SpO2:  [96 %-99 %] 97 % (10/27 0802) Last BM Date : 04/01/22  Intake/Output from previous day: 10/26 0701 - 10/27 0700 In: 989.7 [I.V.:835.4; IV Piggyback:154.4] Out: 130 [Drains:130] Intake/Output this shift: No intake/output data recorded.  General appearance: alert and cooperative Cardio: Normal sinus rhythm GI: Soft nontender.  JP drain serosanguinous.  Incision clean dry intact Incision/Wound: Incision clean dry intact with honeycomb dressing in place  Lab Results:  Recent Labs    04/04/22 0327 04/05/22 0423  WBC 37.5* 24.9*  HGB 9.4* 9.7*  HCT 31.3* 30.6*  PLT 677* PLATELET CLUMPS NOTED ON SMEAR, UNABLE TO ESTIMATE    BMET Recent Labs    04/03/22 0522  NA 137  K 4.2  CL 107  CO2 22  GLUCOSE 147*  BUN 7  CREATININE 0.70  CALCIUM 8.7*    PT/INR No results for input(s): "LABPROT", "INR" in the last 72 hours. ABG No results for input(s): "PHART", "HCO3" in the last 72 hours.  Invalid input(s): "PCO2", "PO2"  Studies/Results: No results found.  Anti-infectives: Anti-infectives (From admission, onward)    Start     Dose/Rate Route Frequency Ordered Stop   04/02/22 1445  ceFAZolin (ANCEF) IVPB 2g/100 mL premix        2 g 200 mL/hr over 30 Minutes Intravenous Every 8 hours 04/02/22 1346 04/02/22 1555   04/02/22 0600  ceFAZolin (ANCEF) IVPB 3g/100 mL premix        3 g 200 mL/hr over 30 Minutes Intravenous On call to O.R. 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

## 2022-04-05 NOTE — Progress Notes (Signed)
Mobility Specialist - Progress Note   04/05/22 1000  Mobility  Activity Ambulated independently in hallway  Level of Assistance Modified independent, requires aide device or extra time  Assistive Device Other (Comment) (IV Pole)  Distance Ambulated (ft) 300 ft  Activity Response Tolerated well  $Mobility charge 1 Mobility    Pt received in recliner agreeable to mobility. Left in recliner w/ call bell and all needs met.   Paulla Dolly Mobility Specialist

## 2022-04-05 NOTE — Progress Notes (Signed)
Nsg Discharge Note  Admit Date:  04/02/2022 Discharge date: 04/05/2022   Arriyana Rodell [facility] to be D/C'd Home per MD order.  AVS completed.   Patient/caregiver able to verbalize understanding.  Discharge Medication: Allergies as of 04/05/2022   No Known Allergies      Medication List     TAKE these medications    acetaminophen 500 MG tablet Commonly known as: TYLENOL Take 1,000 mg by mouth every 6 (six) hours as needed for moderate pain or mild pain.   apixaban 5 MG Tabs tablet Commonly known as: ELIQUIS Take 1 tablet (5 mg total) by mouth 2 (two) times daily.   methocarbamol 500 MG tablet Commonly known as: ROBAXIN Take 1 tablet (500 mg total) by mouth every 6 (six) hours as needed (use for muscle cramps/pain).   omeprazole 20 MG tablet Commonly known as: PRILOSEC OTC Take 20 mg by mouth daily as needed (acid reflux).   ondansetron 8 MG tablet Commonly known as: ZOFRAN Take 8 mg by mouth every 8 (eight) hours as needed for nausea or vomiting.   Oxycodone HCl 10 MG Tabs Take 1 tablet (10 mg total) by mouth every 6 (six) hours as needed for moderate pain (not releieved by tylenol). What changed:  medication strength when to take this reasons to take this        Discharge Assessment: Vitals:   04/05/22 0802 04/05/22 1613  BP: 126/64 132/71  Pulse: 98 88  Resp: 18 16  Temp: 98.8 F (37.1 C) 98.5 F (36.9 C)  SpO2: 97% 99%   Skin clean, dry and intact without evidence of skin break down, no evidence of skin tears noted. IV catheter discontinued intact. Site without signs and symptoms of complications - no redness or edema noted at insertion site, patient denies c/o pain - only slight tenderness at site.  Dressing with slight pressure applied.  D/c Instructions-Education: Discharge instructions given to patient/family with verbalized understanding. D/c education completed with patient/family including follow up instructions, medication list, d/c activities  limitations if indicated, with other d/c instructions as indicated by MD - patient able to verbalize understanding, all questions fully answered. Patient instructed to return to ED, call 911, or call MD for any changes in condition.  Patient escorted via [place], and D/C home via private auto.  Atilano Ina, RN 04/05/2022 5:43 PM

## 2022-04-06 LAB — TYPE AND SCREEN
ABO/RH(D): B POS
Antibody Screen: NEGATIVE
Unit division: 0
Unit division: 0
Unit division: 0
Unit division: 0

## 2022-04-06 LAB — BPAM RBC
Blood Product Expiration Date: 202310302359
Blood Product Expiration Date: 202311052359
Blood Product Expiration Date: 202311062359
Blood Product Expiration Date: 202311062359
ISSUE DATE / TIME: 202310240830
ISSUE DATE / TIME: 202310240830
ISSUE DATE / TIME: 202310240925
ISSUE DATE / TIME: 202310240925
Unit Type and Rh: 7300
Unit Type and Rh: 7300
Unit Type and Rh: 7300
Unit Type and Rh: 7300

## 2022-04-11 ENCOUNTER — Encounter (HOSPITAL_BASED_OUTPATIENT_CLINIC_OR_DEPARTMENT_OTHER): Payer: Self-pay

## 2022-04-11 ENCOUNTER — Other Ambulatory Visit: Payer: Self-pay

## 2022-04-11 ENCOUNTER — Emergency Department (HOSPITAL_BASED_OUTPATIENT_CLINIC_OR_DEPARTMENT_OTHER)
Admission: EM | Admit: 2022-04-11 | Discharge: 2022-04-11 | Disposition: A | Discharge status: Home / Self Care | Payer: No Typology Code available for payment source | Attending: Emergency Medicine | Admitting: Emergency Medicine

## 2022-04-11 ENCOUNTER — Emergency Department (HOSPITAL_BASED_OUTPATIENT_CLINIC_OR_DEPARTMENT_OTHER): Payer: No Typology Code available for payment source

## 2022-04-11 DIAGNOSIS — R109 Unspecified abdominal pain: Secondary | ICD-10-CM | POA: Insufficient documentation

## 2022-04-11 DIAGNOSIS — Z5189 Encounter for other specified aftercare: Secondary | ICD-10-CM

## 2022-04-11 DIAGNOSIS — Z7901 Long term (current) use of anticoagulants: Secondary | ICD-10-CM | POA: Diagnosis not present

## 2022-04-11 DIAGNOSIS — Z48815 Encounter for surgical aftercare following surgery on the digestive system: Secondary | ICD-10-CM | POA: Insufficient documentation

## 2022-04-11 DIAGNOSIS — Z9081 Acquired absence of spleen: Secondary | ICD-10-CM | POA: Diagnosis not present

## 2022-04-11 LAB — CBC WITH DIFFERENTIAL/PLATELET
Abs Immature Granulocytes: 0 10*3/uL (ref 0.00–0.07)
Band Neutrophils: 1 %
Basophils Absolute: 1 10*3/uL — ABNORMAL HIGH (ref 0.0–0.1)
Basophils Relative: 4 %
Eosinophils Absolute: 2.3 10*3/uL — ABNORMAL HIGH (ref 0.0–0.5)
Eosinophils Relative: 9 %
HCT: 34.9 % — ABNORMAL LOW (ref 36.0–46.0)
Hemoglobin: 10.4 g/dL — ABNORMAL LOW (ref 12.0–15.0)
Lymphocytes Relative: 15 %
Lymphs Abs: 3.8 10*3/uL (ref 0.7–4.0)
MCH: 23.6 pg — ABNORMAL LOW (ref 26.0–34.0)
MCHC: 29.8 g/dL — ABNORMAL LOW (ref 30.0–36.0)
MCV: 79.1 fL — ABNORMAL LOW (ref 80.0–100.0)
Monocytes Absolute: 1 10*3/uL (ref 0.1–1.0)
Monocytes Relative: 4 %
Neutro Abs: 17.2 10*3/uL — ABNORMAL HIGH (ref 1.7–7.7)
Neutrophils Relative %: 67 %
Platelets: 1917 10*3/uL (ref 150–400)
RBC: 4.41 MIL/uL (ref 3.87–5.11)
RDW: 25.1 % — ABNORMAL HIGH (ref 11.5–15.5)
WBC: 25.3 10*3/uL — ABNORMAL HIGH (ref 4.0–10.5)
nRBC: 0.6 % — ABNORMAL HIGH (ref 0.0–0.2)

## 2022-04-11 LAB — HEPATIC FUNCTION PANEL
ALT: 7 U/L (ref 0–44)
AST: 13 U/L — ABNORMAL LOW (ref 15–41)
Albumin: 3.3 g/dL — ABNORMAL LOW (ref 3.5–5.0)
Alkaline Phosphatase: 134 U/L — ABNORMAL HIGH (ref 38–126)
Bilirubin, Direct: <0.1 mg/dL (ref 0.0–0.2)
Total Bilirubin: 0.2 mg/dL — ABNORMAL LOW (ref 0.3–1.2)
Total Protein: 6.6 g/dL (ref 6.5–8.1)

## 2022-04-11 LAB — BASIC METABOLIC PANEL
Anion gap: 6 (ref 5–15)
BUN: 17 mg/dL (ref 6–20)
CO2: 26 mmol/L (ref 22–32)
Calcium: 8.6 mg/dL — ABNORMAL LOW (ref 8.9–10.3)
Chloride: 105 mmol/L (ref 98–111)
Creatinine, Ser: 0.87 mg/dL (ref 0.44–1.00)
GFR, Estimated: >60 mL/min (ref >60)
Glucose, Bld: 98 mg/dL (ref 70–99)
Potassium: 3.7 mmol/L (ref 3.5–5.1)
Sodium: 137 mmol/L (ref 135–145)

## 2022-04-11 LAB — LIPASE, BLOOD: Lipase: 28 U/L (ref 11–51)

## 2022-04-11 MED ORDER — IOHEXOL 300 MG/ML  SOLN
100.0000 mL | Freq: Once | INTRAMUSCULAR | Status: AC | PRN
  Administered 2022-04-11: 80 mL via INTRAVENOUS

## 2022-04-11 NOTE — ED Provider Notes (Signed)
[facility] EMERGENCY DEPT Provider Note   CSN: [id number] Arrival date & time: 04/11/22  1755     History  Chief Complaint  Patient presents with   Post-op Problem    Eileen [name] is a 51 y.o. female presenting to the emergency department with complaint of discoloration of her JP drainage.  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  She says she been doing well at home, and typically had an amber-colored drainage from her drain, but today it turned a light milky white color, and has been rapidly filling up.  She called her surgeon's office and was told to come to the ED for repeat CT scan.  She denies fevers or chills.  She has not been on antibiotics since being discharged from the hospital on October 27.  HPI     Home Medications Prior to Admission medications   Medication Sig Start Date End Date Taking? Authorizing Provider  acetaminophen (TYLENOL) 500 MG tablet Take 1,000 mg by mouth every 6 (six) hours as needed for moderate pain or mild pain.    [provider]  apixaban (ELIQUIS) 5 MG TABS tablet Take 1 tablet (5 mg total) by mouth 2 (two) times daily. 03/09/22   [name], MD  methocarbamol (ROBAXIN) 500 MG tablet Take 1 tablet (500 mg total) by mouth every 6 (six) hours as needed (use for muscle cramps/pain). 04/05/22   Jill Alexanders, PA-C  omeprazole (PRILOSEC OTC) 20 MG tablet Take 20 mg by mouth daily as needed (acid reflux).    [provider]  ondansetron (ZOFRAN) 8 MG tablet Take 8 mg by mouth every 8 (eight) hours as needed for nausea or vomiting. 02/27/22   [provider]  oxyCODONE 10 MG TABS Take 1 tablet (10 mg total) by mouth every 6 (six) hours as needed for moderate pain (not releieved by tylenol). 04/05/22   Jill Alexanders, PA-C      Allergies    Patient has no known allergies.    Review  of Systems   Review of Systems  Physical Exam Updated Vital Signs BP 120/66   Pulse 100   Temp 97.9 F (36.6 C)   Resp 18   Ht '5\' 3"'$  (1.6 m)   Wt 95.7 kg   SpO2 95%   BMI 37.37 kg/m  Physical Exam Constitutional:      General: She is not in acute distress. HENT:     Head: Normocephalic and atraumatic.  Eyes:     Conjunctiva/sclera: Conjunctivae normal.     Pupils: Pupils are equal, round, and reactive to light.  Cardiovascular:     Rate and Rhythm: Normal rate and regular rhythm.  Pulmonary:     Effort: Pulmonary effort is normal. No respiratory distress.  Abdominal:     General: There is no distension.     Tenderness: There is no abdominal tenderness.     Comments: JP drain incision site does not appear erythematous, no purulent drainage, no significant abdominal tenderness, JP is draining pale white thin fluid, the color of almond milk  Skin:    General: Skin is warm and dry.  Neurological:     General: No focal deficit present.     Mental Status: She is alert. Mental status is at baseline.  Psychiatric:        Mood and Affect: Mood normal.  Behavior: Behavior normal.     ED Results / Procedures / Treatments   Labs (all labs ordered are listed, but only abnormal results are displayed) Labs Reviewed  BASIC METABOLIC PANEL - Abnormal; Notable for the following components:      Result Value   Calcium 8.6 (*)    All other components within normal limits  CBC WITH DIFFERENTIAL/PLATELET - Abnormal; Notable for the following components:   WBC 25.3 (*)    Hemoglobin 10.4 (*)    HCT 34.9 (*)    MCV 79.1 (*)    MCH 23.6 (*)    MCHC 29.8 (*)    RDW 25.1 (*)    Platelets 1,917 (*)    nRBC 0.6 (*)    Neutro Abs 17.2 (*)    Eosinophils Absolute 2.3 (*)    Basophils Absolute 1.0 (*)    All other components within normal limits  HEPATIC FUNCTION PANEL - Abnormal; Notable for the following components:   Albumin 3.3 (*)    AST 13 (*)    Alkaline Phosphatase  134 (*)    Total Bilirubin 0.2 (*)    All other components within normal limits  LIPASE, BLOOD  PATHOLOGIST SMEAR REVIEW  AMYLASE    EKG None  Radiology CT ABDOMEN PELVIS W CONTRAST  Result Date: 04/11/2022 CLINICAL DATA:  Abdominal pain.  Status post splenectomy 1 week ago. EXAM: CT ABDOMEN AND PELVIS WITH CONTRAST TECHNIQUE: Multidetector CT imaging of the abdomen and pelvis was performed using the standard protocol following bolus administration of intravenous contrast. RADIATION DOSE REDUCTION: This exam was performed according to the departmental dose-optimization program which includes automated exposure control, adjustment of the mA and/or kV according to patient size and/or use of iterative reconstruction technique. CONTRAST:  98m OMNIPAQUE IOHEXOL 300 MG/ML  SOLN COMPARISON:  CT abdomen pelvis dated 02/27/2022. FINDINGS: Lower chest: Small left pleural effusion with partial compressive atelectasis of the left lower lobe. Pneumonia is not excluded. The right lung base is clear. No intra-abdominal free air. Diffuse mesenteric edema and small free fluid within the pelvis. Hepatobiliary: Fatty liver. Mild biliary dilatation, likely post cholecystectomy. No retained calcified stone noted in the central CBD. Pancreas: Mild fatty infiltration of the pancreas. Mild peripancreatic stranding, likely related to diffuse mesenteric edema. Correlation with pancreatic enzymes recommended to exclude acute pancreatitis. Spleen: Status post splenectomy. A drainage catheter is noted in the left upper abdomen. No fluid collection or abscess identified. Adrenals/Urinary Tract: The adrenal glands are unremarkable. There is no hydronephrosis on either side. There is symmetric enhancement and excretion of contrast by both kidneys. The visualized ureters and urinary bladder appear unremarkable. Stomach/Bowel: There is no bowel obstruction. Diffuse mesenteric edema, may be related to underlying liver disease. The  appendix is normal. Vascular/Lymphatic: The abdominal aorta and IVC are unremarkable. 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 Similar finding was seen on the prior CT. No portal venous gas. Small scattered mesenteric lymph nodes, reactive. Reproductive: Hysterectomy.  No adnexal masses. Other: Mild diffuse subcutaneous edema. Musculoskeletal: No acute osseous pathology. IMPRESSION: 1. Status post splenectomy. No fluid collection or abscess identified in the left upper abdomen. 2. Fatty liver. 3. 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 Similar finding was seen on the prior CT. 4. Small left pleural effusion with partial compressive atelectasis of the left lower lobe. Pneumonia is not excluded. 5. No bowel obstruction. Normal appendix. Electronically Signed   By: AAnner CreteM.D.   On: 04/11/2022 21:20  Procedures Procedures    Medications Ordered in ED Medications  iohexol (OMNIPAQUE) 300 MG/ML solution 100 mL (80 mLs Intravenous Contrast Given 04/11/22 2100)    ED Course/ Medical Decision Making/ A&P Clinical Course as of 04/11/22 2144  Thu Apr 11, 2022  2050 Patient is a history of polycythemia vera, JAK2 positive.  Platelets are elevated today, have been elevated in the past. [MT]  2112 I spoke to Dr Benay Spice from oncology regarding the thrombocytosis and he reports that this would be expected after splenectomy, and I do believe this is reasonable after a large operation like this, particularly splenectomy.  He thinks it is unlikely that the elevated platelet counts are causing the discoloration noted in the JP drain [MT]  2125 There is some pancreatic inflammation on CT, mild, with normal lipase, and fairly unremarkable hepatic enzymes.  I have paged general surgery to discuss this visit [MT]  2133 I spoke to general surgeon Dr Tonita Cong who advised that the patient is not having evidence of abscess or abdominal tenderness it would be okay for her to follow-up as  an outpatient with the surgeon who performed the procedure, it is possible that she has a small pancreatic "leak" that is causing discoloration of her fluid, but with otherwise normal blood work, she can follow-up for this as an outpatient.  I agree that she is not having any events of severe pancreatitis, no GI issues, and can follow-up outpatient [MT]    Clinical Course User Index [MT] Joni Colegrove, Carola Rhine, MD                           Medical Decision Making Amount and/or Complexity of Data Reviewed Labs: ordered. Radiology: ordered.  Risk Prescription drug management.   The patient is here with a possible postoperative complication with new drainage from her JP site.  She is not having any pain and does not show any evidence of sepsis or infection on initial exam.  It is not clear to me what would be causing the color change, which may be completely physiological, but may also be indicative of an infection.  We will check labs and likely order a CT scan to reevaluate the drains positioning and for potential abscess.  The patient is in agreement with this plan.  I personally reviewed her external records including her operative course from October and her discharge.  The patient is accompanied by her mother in emergency department.  *  I personally reviewed interpreted patient's labs and imaging.  This is notable for stable white blood cell count, thrombocytosis, lipase within normal limits, BMP unremarkable.  I spoke to oncology and general surgery by phone, see ED course.  CT imaging per my review shows no drainable collection or abscess, some peripancreatic edema; no other emergent findings.  On my reassessment, the patient remained quite comfortable in the room, no worsening pain or GI symptoms.  She is stable for discharge.  She is not requiring antibiotics at this time as there is no clear evidence of infection.  She will follow-up with a general surgeon in the  office.        Final Clinical Impression(s) / ED Diagnoses Final diagnoses:  Visit for wound check    Rx / DC Orders ED Discharge Orders     None         Wyvonnia Dusky, MD 04/11/22 2144

## 2022-04-11 NOTE — ED Notes (Addendum)
Unsure if Pt hesitant to take deep breath when auscultating lung sounds. Pt. Stated that she did take a full breath. Pt stated that she feels a "snag" at incision site when she stakes a deep breath.   Specimen from JP Drain in cup with label at bedside if needed

## 2022-04-11 NOTE — ED Notes (Signed)
Platelets 1917 verified and read back with Simona Huh.

## 2022-04-11 NOTE — Discharge Instructions (Signed)
Please call your surgeon's office to request a closer follow-up appointment, if possible.  Your CT scan did not show signs of infection.  It is possible that you do have some swelling around your pancreas and some "leakage" of pancreas enzymes, which can cause white discoloration.  This is not a life-threatening condition.  But it should be monitored.  If you begin having intense abdominal pain, particular radiating to your mid back, or bad nausea or vomiting, or bloody diarrhea, please return to the ER.

## 2022-04-11 NOTE — ED Triage Notes (Signed)
Patient here POV from Home.  Endorses Removal of 10 lb Spleen due to Splenomegaly. This Occurred 9-10 Days. No Complications since but today (2 Hours PTA) she began to have Cloudy/Milky Drainage into JP Drain versus a Clear Fluid which it has been typically.   No Fevers.   NAD Noted during Triage. A&Ox4. GCS 15. Ambulatory.

## 2022-04-14 LAB — AMYLASE, BODY FLUID (OTHER): Amylase, Body Fluid: 6 U/L

## 2022-04-15 ENCOUNTER — Telehealth: Payer: Self-pay | Admitting: Oncology

## 2022-04-15 LAB — PATHOLOGIST SMEAR REVIEW

## 2022-04-15 NOTE — Telephone Encounter (Signed)
Contacted patient to scheduled appointments. Left message with appointment details and a call back number if patient had any questions or could not accommodate the time we provided.   

## 2022-04-19 ENCOUNTER — Inpatient Hospital Stay: Payer: No Typology Code available for payment source | Attending: Oncology | Admitting: Oncology

## 2022-04-19 ENCOUNTER — Other Ambulatory Visit: Payer: Self-pay | Admitting: Oncology

## 2022-04-19 ENCOUNTER — Other Ambulatory Visit: Payer: Self-pay

## 2022-04-19 VITALS — BP 108/66 | HR 91 | Temp 97.7°F | Resp 17 | Wt 202.4 lb

## 2022-04-19 DIAGNOSIS — D45 Polycythemia vera: Secondary | ICD-10-CM

## 2022-04-19 DIAGNOSIS — D72829 Elevated white blood cell count, unspecified: Secondary | ICD-10-CM | POA: Insufficient documentation

## 2022-04-19 DIAGNOSIS — E611 Iron deficiency: Secondary | ICD-10-CM | POA: Insufficient documentation

## 2022-04-19 DIAGNOSIS — D75839 Thrombocytosis, unspecified: Secondary | ICD-10-CM | POA: Insufficient documentation

## 2022-04-19 DIAGNOSIS — Z7901 Long term (current) use of anticoagulants: Secondary | ICD-10-CM | POA: Insufficient documentation

## 2022-04-19 DIAGNOSIS — R161 Splenomegaly, not elsewhere classified: Secondary | ICD-10-CM | POA: Insufficient documentation

## 2022-04-19 DIAGNOSIS — Z9081 Acquired absence of spleen: Secondary | ICD-10-CM | POA: Insufficient documentation

## 2022-04-19 MED ORDER — APIXABAN 5 MG PO TABS: 5.0000 mg | ORAL_TABLET | Freq: Two times a day (BID) | ORAL | 2 refills | Status: DC

## 2022-04-19 NOTE — Addendum Note (Signed)
Addended by: [name] on: 04/19/2022 04:15 PM   Modules accepted: Orders

## 2022-04-19 NOTE — Progress Notes (Signed)
Hematology and Oncology Follow Up Visit  Eileen [name] [id number] [birth year] 51 y.o. 04/19/2022 2:04 PM [name], MDRichter, [name], MD   Principle Diagnosis: 51 year old woman with JAK2 positive polycythemia vera diagnosed in 2017.    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  Current therapy:   Therapeutic phlebotomy every 4 weeks to keep her hematocrit less than 45.   Jakafi 10 mg daily started in December 2022.  Eliquis 5 mg twice daily started in September 2023 after developing splenic vein thrombosis.  Interim History: Ms. Farino is here for a follow-up visit.  Since her last visit, she underwent for splenectomy procedure which was completed on October 24.  Since her surgery, she is recovering slowly at this time with some residual pain around the incision and continues to have a drain in place.  She denies any bleeding or thrombosis episodes.  She denies any changes in her mental status or confusion.  Her performance status quality of life remains unchanged.   Medications: Updated on review. Current Outpatient Medications  Medication Sig Dispense Refill   acetaminophen (TYLENOL) 500 MG tablet Take 1,000 mg by mouth every 6 (six) hours as needed for moderate pain or mild pain.     apixaban (ELIQUIS) 5 MG TABS tablet Take 1 tablet (5 mg total) by mouth 2 (two) times daily. 60 tablet 1   methocarbamol (ROBAXIN) 500 MG tablet Take 1 tablet (500 mg total) by mouth every 6 (six) hours as needed (use for muscle cramps/pain). 30 tablet 0   omeprazole (PRILOSEC OTC) 20 MG tablet Take 20 mg by mouth daily as needed (acid reflux).     ondansetron (ZOFRAN) 8 MG tablet Take 8 mg by mouth every 8 (eight) hours as needed for nausea or vomiting.     oxyCODONE 10 MG TABS Take 1 tablet (10 mg total) by mouth every 6 (six) hours as  needed for moderate pain (not releieved by tylenol). 25 tablet 0   No current facility-administered medications for this visit.   Facility-Administered Medications Ordered in Other Visits  Medication Dose Route Frequency Provider Last Rate Last Admin   gadopentetate dimeglumine (MAGNEVIST) injection 18 mL  18 mL Intravenous Once PRN Melvenia Beam, MD         Allergies: No Known Allergies    Physical Exam:        Blood pressure 108/66, pulse 91, temperature 97.7 F (36.5 C), temperature source Temporal, resp. rate 17, weight 202 lb 6.4 oz (91.8 kg), SpO2 99 %.        ECOG: 0   General appearance: Comfortable appearing without any discomfort Head: Normocephalic without any trauma Oropharynx: Mucous membranes are moist and pink without any thrush or ulcers. Eyes: Pupils are equal and round reactive to light. Lymph nodes: No cervical, supraclavicular, inguinal or axillary lymphadenopathy.   Heart:regular rate and rhythm.  S1 and S2 without leg edema. Lung: Clear without any rhonchi or wheezes.  No dullness to percussion. Abdomin: Soft, nontender, nondistended with good bowel sounds.  No hepatosplenomegaly. Musculoskeletal: No joint deformity or effusion.  Full range of motion noted. Neurological: No deficits noted on motor, sensory and deep tendon reflex exam. Skin: No petechial rash or dryness.  Appeared moist.  Psychiatric: Mood and affect appeared appropriate. .              Lab Results: Lab Results  Component Value Date   WBC 25.3 (H) 04/11/2022   HGB 10.4 (L) 04/11/2022   HCT 34.9 (L) 04/11/2022   MCV 79.1 (L) 04/11/2022   PLT 1,917 (HH) 04/11/2022     Chemistry      Component Value Date/Time   NA 137 04/11/2022 2003   NA 140 02/20/2016 1432   K 3.7 04/11/2022 2003   K 4.1 02/20/2016 1432   CL 105 04/11/2022 2003   CO2 26 04/11/2022 2003   CO2 25 02/20/2016 1432   BUN 17 04/11/2022 2003   BUN 11.1 02/20/2016 1432   CREATININE 0.87  04/11/2022 2003   CREATININE 1.0 02/20/2016 1432      Component Value Date/Time   CALCIUM 8.6 (L) 04/11/2022 2003   CALCIUM 9.8 02/20/2016 1432   ALKPHOS 134 (H) 04/11/2022 2003   ALKPHOS 115 02/20/2016 1432   AST 13 (L) 04/11/2022 2003   AST 13 02/20/2016 1432   ALT 7 04/11/2022 2003   ALT 13 02/20/2016 1432   BILITOT 0.2 (L) 04/11/2022 2003   BILITOT 0.71 02/20/2016 1432      Impression and Plan:  51 year old woman with:  1.  Myeloproliferative disorder diagnosed in 2017.  She was found to have JAK2 positive polycythemia vera.  She has been on Jakafi after receiving therapeutic phlebotomy.  Risks and benefits of continuing his treatment were discussed.  Her hemoglobin on November 2 was 10.4 and does not require any phlebotomy at this time. [name] will continue to be on hold for the time being as she is recover from her surgery.  2.  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  The duration of anticoagulation would be 3 to 6 months.   3.  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  No additional treatment is needed at this time.  4.  Leukocytosis and thrombocytosis: This is related to splenic surgery and anticipate improvement over period of time.  5. Follow-up: In 4 weeks for a follow-up visit..  30  minutes were dedicated to this visit.  Time spent on updating disease status, treatment choices and outlining future plan of care review.  Zola Button, MD 11/10/20232:04 PM

## 2022-04-22 NOTE — Discharge Summary (Signed)
Eileen [name] Discharge Summary   Patient ID: Eileen [name] MRN: [id number] DOB/AGE: Nov 09, 1970 51 y.o.  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  Procedures Dr. Brantley Stage 04/02/22 open splenectomy   HPI: Patient presents at the request of Dr. Aris Lot of hematology due to splenomegaly. She was admitted approximately 3 weeks ago to the hospital with left upper quadrant abdominal pain. 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 She was admitted and anticoagulated. She was discharged home on Eliquis. Her pain is much better controlled now. 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 No history of GI bleeding. This is her first episode like this. Her pain is much better controlled than it was in the hospital she states. She also has early satiety.   Hospital Course:  Underwent splenectomy as above. Tolerated the procedure well and was transferred to the floor. NGT tube removed POD#1. Diet slowly advanced as tolerated. Eliquis resumed POD#2. On POD#3 vitals were stable, pain controlled, tolerating PO, mobilizing, and felt stable for discharge home. She was discharged home with surgical blake drain in place. Follow up as below.   I have personally reviewed  the patients medication history on the [place] controlled substance database.   Allergies as of 04/05/2022   No Known Allergies      Medication List     TAKE these medications    acetaminophen 500 MG tablet Commonly known as: TYLENOL Take 1,000 mg by mouth every 6 (six) hours as needed for moderate pain or mild pain.   methocarbamol 500 MG tablet Commonly known as: ROBAXIN Take 1 tablet (500 mg total) by mouth every 6 (six) hours as needed (use for muscle cramps/pain).   omeprazole 20 MG tablet Commonly known as: PRILOSEC OTC Take 20 mg by mouth daily as needed (acid reflux).   ondansetron 8 MG tablet Commonly known as: ZOFRAN Take 8 mg by mouth every 8 (eight) hours as needed for nausea or vomiting.   Oxycodone HCl 10 MG Tabs Take 1 tablet (10 mg total) by mouth every 6 (six) hours as needed for moderate pain (not releieved by tylenol). What changed:  medication strength when to take this reasons to take this          Follow-up Information     Erroll Luna, MD. Go on 04/23/2022.   Specialty: General [name] Why: follow up as previosly scheduled at 9:10 am. please arrive 15 minutes to complete check in process and bring photo ID and insurance card if you have them Contact information: [place] [address] [phone]         [name], [place] Follow up on 04/16/2022.   Specialty: General [name] Why: This is a nurse visit for staple removal. Please bring a copy of your photo ID and insurance card. Please arrive  30 minutes prior to your appointment for paperwork. Contact information: [place] [address] [phone]         [name], MD. Schedule an appointment as soon as possible for a visit.   Specialty: Family Medicine Why: for additional post splenectomy vaccines. Contact information: [address] [place]  [address] [phone]                  Signed: Obie Dredge, [facility] [name] 04/22/2022, 1:34 PM

## 2022-04-30 ENCOUNTER — Telehealth: Payer: Self-pay | Admitting: Oncology

## 2022-04-30 NOTE — Telephone Encounter (Signed)
Called patient regarding upcoming December appointments, patient is notified. 

## 2022-05-06 IMAGING — NM NM HEPATO W/GB/PHARM/[PERSON_NAME]
2 series · 12 of 12 positions shown · non-contrast
Comparison: None.

CLINICAL DATA: Postprandial right upper quadrant pain and nausea
for 1 month.

EXAM:
NUCLEAR MEDICINE HEPATOBILIARY IMAGING WITH GALLBLADDER EF
TECHNIQUE: Sequential images of the abdomen were obtained [DATE] minutes
following intravenous administration of radiopharmaceutical. After
oral ingestion of Ensure, gallbladder ejection fraction was
determined. At 60 min, normal ejection fraction is greater than 33%.
RADIOPHARMACEUTICALS:  5.3 mCi Dc-PPm  Choletec IV

[he hepatobiliary · 4.52mm/px · 6 of 60 frames shown (1 of 2)]
[frame 6/60]
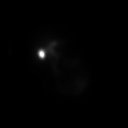
[frame 16/60]
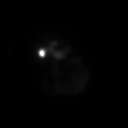
[frame 26/60]
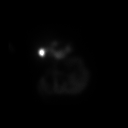
[frame 36/60]
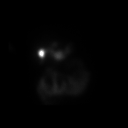
[frame 46/60]
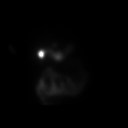
[frame 56/60]
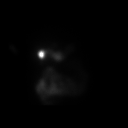

[he hepatobiliary · 4.52mm/px · 6 of 60 frames shown (2 of 2)]
[frame 6/60]
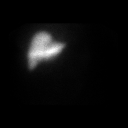
[frame 16/60]
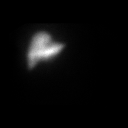
[frame 26/60]
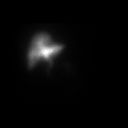
[frame 36/60]
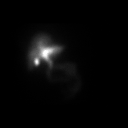
[frame 46/60]
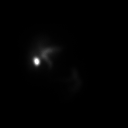
[frame 56/60]
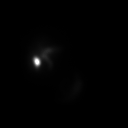

[12 of 12 positions shown; findings below may reference images not displayed]

FINDINGS: Prompt uptake and biliary excretion of activity by the liver is
seen. Gallbladder activity is visualized, consistent with patency of
cystic duct. Biliary activity passes into small bowel, consistent
with patent common bile duct.

Calculated gallbladder ejection fraction is 1%. (Normal gallbladder
ejection fraction with Ensure is greater than 33%.)
IMPRESSION: Normal hepatobiliary scan, demonstrating patency of cystic and
common bile ducts.

Markedly decreased gallbladder ejection fraction of 1%.

## 2022-05-08 ENCOUNTER — Other Ambulatory Visit: Payer: No Typology Code available for payment source

## 2022-05-10 ENCOUNTER — Other Ambulatory Visit: Payer: Self-pay | Admitting: Surgery

## 2022-05-10 ENCOUNTER — Other Ambulatory Visit: Payer: Self-pay

## 2022-05-10 ENCOUNTER — Other Ambulatory Visit (HOSPITAL_COMMUNITY): Payer: Self-pay | Admitting: Surgery

## 2022-05-10 DIAGNOSIS — R161 Splenomegaly, not elsewhere classified: Secondary | ICD-10-CM

## 2022-05-10 DIAGNOSIS — Z9889 Other specified postprocedural states: Secondary | ICD-10-CM

## 2022-05-12 LAB — AMYLASE, BODY FLUID (OTHER): Amylase, Body Fluid: 8 U/L

## 2022-05-18 ENCOUNTER — Encounter (HOSPITAL_BASED_OUTPATIENT_CLINIC_OR_DEPARTMENT_OTHER): Payer: Self-pay

## 2022-05-18 ENCOUNTER — Ambulatory Visit (HOSPITAL_BASED_OUTPATIENT_CLINIC_OR_DEPARTMENT_OTHER)
Admission: RE | Admit: 2022-05-18 | Discharge: 2022-05-18 | Disposition: A | Discharge status: Home / Self Care | Payer: No Typology Code available for payment source | Source: Ambulatory Visit | Attending: Surgery | Admitting: Surgery

## 2022-05-18 DIAGNOSIS — R161 Splenomegaly, not elsewhere classified: Secondary | ICD-10-CM | POA: Insufficient documentation

## 2022-05-18 DIAGNOSIS — Z9889 Other specified postprocedural states: Secondary | ICD-10-CM | POA: Diagnosis present

## 2022-05-18 MED ORDER — IOHEXOL 300 MG/ML  SOLN
100.0000 mL | Freq: Once | INTRAMUSCULAR | Status: AC | PRN
  Administered 2022-05-18: 100 mL via INTRAVENOUS

## 2022-05-23 ENCOUNTER — Inpatient Hospital Stay: Payer: No Typology Code available for payment source | Attending: Oncology

## 2022-05-23 ENCOUNTER — Inpatient Hospital Stay (HOSPITAL_BASED_OUTPATIENT_CLINIC_OR_DEPARTMENT_OTHER): Payer: No Typology Code available for payment source | Admitting: Oncology

## 2022-05-23 ENCOUNTER — Telehealth: Payer: Self-pay

## 2022-05-23 VITALS — BP 120/81 | HR 111 | Temp 98.3°F | Resp 18 | Ht 63.0 in | Wt 212.5 lb

## 2022-05-23 DIAGNOSIS — Z86718 Personal history of other venous thrombosis and embolism: Secondary | ICD-10-CM | POA: Insufficient documentation

## 2022-05-23 DIAGNOSIS — D735 Infarction of spleen: Secondary | ICD-10-CM | POA: Diagnosis not present

## 2022-05-23 DIAGNOSIS — Z9081 Acquired absence of spleen: Secondary | ICD-10-CM | POA: Diagnosis not present

## 2022-05-23 DIAGNOSIS — Z7901 Long term (current) use of anticoagulants: Secondary | ICD-10-CM | POA: Diagnosis not present

## 2022-05-23 DIAGNOSIS — D45 Polycythemia vera: Secondary | ICD-10-CM | POA: Diagnosis present

## 2022-05-23 DIAGNOSIS — D500 Iron deficiency anemia secondary to blood loss (chronic): Secondary | ICD-10-CM

## 2022-05-23 LAB — CBC WITH DIFFERENTIAL (CANCER CENTER ONLY)
Abs Immature Granulocytes: 0.09 10*3/uL — ABNORMAL HIGH (ref 0.00–0.07)
Basophils Absolute: 0.5 10*3/uL — ABNORMAL HIGH (ref 0.0–0.1)
Basophils Relative: 3 %
Eosinophils Absolute: 1.3 10*3/uL — ABNORMAL HIGH (ref 0.0–0.5)
Eosinophils Relative: 9 %
HCT: 30.7 % — ABNORMAL LOW (ref 36.0–46.0)
Hemoglobin: 9.5 g/dL — ABNORMAL LOW (ref 12.0–15.0)
Immature Granulocytes: 1 %
Lymphocytes Relative: 15 %
Lymphs Abs: 2.3 10*3/uL (ref 0.7–4.0)
MCH: 22.1 pg — ABNORMAL LOW (ref 26.0–34.0)
MCHC: 30.9 g/dL (ref 30.0–36.0)
MCV: 71.6 fL — ABNORMAL LOW (ref 80.0–100.0)
Monocytes Absolute: 1.4 10*3/uL — ABNORMAL HIGH (ref 0.1–1.0)
Monocytes Relative: 9 %
Neutro Abs: 10 10*3/uL — ABNORMAL HIGH (ref 1.7–7.7)
Neutrophils Relative %: 63 %
Platelet Count: 2087 10*3/uL (ref 150–400)
RBC: 4.29 MIL/uL (ref 3.87–5.11)
RDW: 23.9 % — ABNORMAL HIGH (ref 11.5–15.5)
WBC Count: 15.6 10*3/uL — ABNORMAL HIGH (ref 4.0–10.5)
nRBC: 1.1 % — ABNORMAL HIGH (ref 0.0–0.2)

## 2022-05-23 LAB — IRON AND IRON BINDING CAPACITY (CC-WL,HP ONLY)
Iron: 13 ug/dL — ABNORMAL LOW (ref 28–170)
Saturation Ratios: 7 % — ABNORMAL LOW (ref 10.4–31.8)
TIBC: 175 ug/dL — ABNORMAL LOW (ref 250–450)
UIBC: 162 ug/dL (ref 148–442)

## 2022-05-23 LAB — FERRITIN: Ferritin: 18 ng/mL (ref 11–307)

## 2022-05-23 NOTE — Telephone Encounter (Signed)
Dr [name] advised

## 2022-05-23 NOTE — Progress Notes (Signed)
Hematology and Oncology Follow Up Visit  LUCILIA YANNI [id number] [birth year] 51 y.o. 05/23/2022 10:52 AM Darron Doom [name], MDRichter, [name], MD   Principle Diagnosis: 51 year old woman with polycythemia vera diagnosed in 2017.  She was found to have JAK2 positive.  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  Therapeutic phlebotomy every 4 weeks to keep her hematocrit less than 45.  Treatment has been on hold since December 2022.  Current therapy:  Jakafi 10 mg daily started in December 2022.  Eliquis 5 mg twice daily started in September 2023 after developing splenic vein thrombosis.  Interim History: Ms. Reamer returns today for a follow-up visit.  Since the last visit, she reports a few complaints.  She is reported lower back pain in the last 2 weeks and had a CT scan on May 18, 2022.  The scan showed incidental thrombosis in the infrarenal abdominal aorta without any vascular compromise to the bowel.  She is scheduled to have CT abdomen with angiogram in the near future.  In the meantime, she denies any abdominal pain or discomfort.  She denies any hematochezia or melena.  She remains on Eliquis.   Medications: Reviewed without changes. Current Outpatient Medications  Medication Sig Dispense Refill   acetaminophen (TYLENOL) 500 MG tablet Take 1,000 mg by mouth every 6 (six) hours as needed for moderate pain or mild pain.     apixaban (ELIQUIS) 5 MG TABS tablet Take 1 tablet (5 mg total) by mouth 2 (two) times daily. 60 tablet 2   methocarbamol (ROBAXIN) 500 MG tablet Take 1 tablet (500 mg total) by mouth every 6 (six) hours as needed (use for muscle cramps/pain). 30 tablet 0   omeprazole (PRILOSEC OTC) 20 MG tablet Take 20 mg by mouth daily as needed (acid reflux).     ondansetron (ZOFRAN) 8 MG tablet Take 8 mg by mouth every  8 (eight) hours as needed for nausea or vomiting.     oxyCODONE 10 MG TABS Take 1 tablet (10 mg total) by mouth every 6 (six) hours as needed for moderate pain (not releieved by tylenol). 25 tablet 0   No current facility-administered medications for this visit.   Facility-Administered Medications Ordered in Other Visits  Medication Dose Route Frequency Provider Last Rate Last Admin   gadopentetate dimeglumine (MAGNEVIST) injection 18 mL  18 mL Intravenous Once PRN Melvenia Beam, MD         Allergies: No Known Allergies    Physical Exam:          Blood pressure 120/81, pulse (!) 111, temperature 98.3 F (36.8 C), temperature source Temporal, resp. rate 18, height '5\' 3"'$  (1.6 m), weight 212 lb 8 oz (96.4 kg), SpO2 99 %.       ECOG: 0   General appearance: Alert, awake without any distress. Head: Atraumatic without abnormalities Oropharynx: Without any thrush or ulcers. Eyes: No scleral icterus. Lymph nodes: No lymphadenopathy noted in the cervical, supraclavicular, or axillary nodes Heart:regular rate and rhythm, without any murmurs or gallops.   Lung: Clear to auscultation without any rhonchi, wheezes or dullness to percussion. Abdomin: Soft, nontender without any shifting dullness or ascites. Musculoskeletal: No clubbing or cyanosis. Neurological: No motor or sensory deficits. Skin: No rashes or lesions.               Lab Results: Lab Results  Component Value  Date   WBC 25.3 (H) 04/11/2022   HGB 10.4 (L) 04/11/2022   HCT 34.9 (L) 04/11/2022   MCV 79.1 (L) 04/11/2022   PLT 1,917 (HH) 04/11/2022     Chemistry      Component Value Date/Time   NA 137 04/11/2022 2003   NA 140 02/20/2016 1432   K 3.7 04/11/2022 2003   K 4.1 02/20/2016 1432   CL 105 04/11/2022 2003   CO2 26 04/11/2022 2003   CO2 25 02/20/2016 1432   BUN 17 04/11/2022 2003   BUN 11.1 02/20/2016 1432   CREATININE 0.87 04/11/2022 2003   CREATININE 1.0 02/20/2016 1432       Component Value Date/Time   CALCIUM 8.6 (L) 04/11/2022 2003   CALCIUM 9.8 02/20/2016 1432   ALKPHOS 134 (H) 04/11/2022 2003   ALKPHOS 115 02/20/2016 1432   AST 13 (L) 04/11/2022 2003   AST 13 02/20/2016 1432   ALT 7 04/11/2022 2003   ALT 13 02/20/2016 1432   BILITOT 0.2 (L) 04/11/2022 2003   BILITOT 0.71 02/20/2016 1432         Impression and Plan:  51 year old woman with:  1.  JAK2 positive myeloproliferative disorder diagnosed in 2017 after presenting with polycythemia vera.  She is currently on Jakafi with excellent response and tolerance at this time.  Risks and benefits of continuing this treatment were discussed.  It appears to have helped her constitutional symptoms and we will continue for the time being.  It is unclear to me whether [name] is contributing to her recurrent thrombosis at this time and I recommended discontinuation given her ongoing issues.   2.  Recurrent thrombosis: She initially had a splenic vein thrombosis and currently potential arterial thrombosis.  She is currently on Eliquis which she will complete 6 months of therapy in March 2024.  Extending her anticoagulation beyond that could be considered if other thrombosis is detected.  She is scheduled to have CT angiogram in the near future and potential evaluation by vascular surgery.  This is managed by Dr. Paula Libra at [facility] surgery.   3.  Splenomegaly: Related to extramedullary hematopoiesis and status post splenectomy.  4.  Leukocytosis and thrombocytosis: Related to her myeloproliferative disorder and splenectomy.  Her white cell count has improved but platelets remain high.  Anticipate improvement over period of time.  5. Follow-up: She will return in 4 weeks for repeat evaluation.  30  minutes were spent on this encounter.  The time was dedicated to updating disease status, treatment choices and outlining future plan of care review.  Zola Button, MD 12/14/202310:52 AM

## 2022-05-23 NOTE — Telephone Encounter (Signed)
CRITICAL VALUE STICKER  CRITICAL VALUE: Platelets [phone]  RECEIVER (on-site recipient of call): Maurine Simmering CMA  DATE & TIME NOTIFIED: 05/23/22 1110  MESSENGER (representative from lab): Lelan Pons  MD NOTIFIED: Dr Irene Limbo and nurse  TIME OF NOTIFICATION: [phone]  RESPONSE:  Notified physician and nurse

## 2022-05-28 ENCOUNTER — Other Ambulatory Visit (HOSPITAL_COMMUNITY): Payer: Self-pay | Admitting: Surgery

## 2022-05-28 DIAGNOSIS — M7989 Other specified soft tissue disorders: Secondary | ICD-10-CM

## 2022-05-28 DIAGNOSIS — R221 Localized swelling, mass and lump, neck: Secondary | ICD-10-CM

## 2022-05-29 ENCOUNTER — Encounter (HOSPITAL_COMMUNITY): Payer: No Typology Code available for payment source

## 2022-05-29 ENCOUNTER — Ambulatory Visit (HOSPITAL_COMMUNITY)
Admission: RE | Admit: 2022-05-29 | Discharge: 2022-05-29 | Disposition: A | Discharge status: Home / Self Care | Payer: No Typology Code available for payment source | Source: Ambulatory Visit | Attending: Surgery | Admitting: Surgery

## 2022-05-29 ENCOUNTER — Encounter (HOSPITAL_COMMUNITY): Payer: Self-pay

## 2022-05-29 ENCOUNTER — Ambulatory Visit (HOSPITAL_BASED_OUTPATIENT_CLINIC_OR_DEPARTMENT_OTHER)
Admission: RE | Admit: 2022-05-29 | Discharge: 2022-05-29 | Disposition: A | Discharge status: Home / Self Care | Payer: No Typology Code available for payment source | Source: Ambulatory Visit | Attending: Surgery | Admitting: Surgery

## 2022-05-29 ENCOUNTER — Encounter: Payer: Self-pay | Admitting: Surgery

## 2022-05-29 ENCOUNTER — Ambulatory Visit (HOSPITAL_COMMUNITY): Payer: No Typology Code available for payment source

## 2022-05-29 DIAGNOSIS — M7989 Other specified soft tissue disorders: Secondary | ICD-10-CM | POA: Diagnosis present

## 2022-05-29 NOTE — Progress Notes (Signed)
Left upper extremity venous duplex and left lower extremity venous duplex has been completed. Preliminary results can be found in CV Proc through chart review.  Results were given to [facility] at Dr. Josetta Huddle office.  05/29/22 9:07 AM Carlos Levering RVT

## 2022-06-12 ENCOUNTER — Ambulatory Visit: Payer: No Typology Code available for payment source | Admitting: Oncology

## 2022-06-12 ENCOUNTER — Telehealth: Payer: Self-pay | Admitting: Oncology

## 2022-06-12 NOTE — Telephone Encounter (Signed)
Called patient regarding upcoming January appointments, left a voicemail. 

## 2022-06-14 ENCOUNTER — Other Ambulatory Visit: Payer: Self-pay | Admitting: Surgery

## 2022-06-14 DIAGNOSIS — I7410 Embolism and thrombosis of unspecified parts of aorta: Secondary | ICD-10-CM

## 2022-06-21 ENCOUNTER — Inpatient Hospital Stay (HOSPITAL_BASED_OUTPATIENT_CLINIC_OR_DEPARTMENT_OTHER): Payer: No Typology Code available for payment source | Admitting: Oncology

## 2022-06-21 ENCOUNTER — Inpatient Hospital Stay: Payer: No Typology Code available for payment source | Attending: Oncology

## 2022-06-21 VITALS — BP 127/85 | HR 112 | Temp 98.0°F | Resp 18 | Ht 63.0 in | Wt 202.3 lb

## 2022-06-21 DIAGNOSIS — D75839 Thrombocytosis, unspecified: Secondary | ICD-10-CM | POA: Diagnosis not present

## 2022-06-21 DIAGNOSIS — D72829 Elevated white blood cell count, unspecified: Secondary | ICD-10-CM | POA: Diagnosis not present

## 2022-06-21 DIAGNOSIS — D45 Polycythemia vera: Secondary | ICD-10-CM | POA: Insufficient documentation

## 2022-06-21 DIAGNOSIS — D508 Other iron deficiency anemias: Secondary | ICD-10-CM | POA: Insufficient documentation

## 2022-06-21 DIAGNOSIS — Z9081 Acquired absence of spleen: Secondary | ICD-10-CM | POA: Insufficient documentation

## 2022-06-21 DIAGNOSIS — Z7901 Long term (current) use of anticoagulants: Secondary | ICD-10-CM | POA: Insufficient documentation

## 2022-06-21 DIAGNOSIS — Z86718 Personal history of other venous thrombosis and embolism: Secondary | ICD-10-CM | POA: Insufficient documentation

## 2022-06-21 DIAGNOSIS — G441 Vascular headache, not elsewhere classified: Secondary | ICD-10-CM | POA: Insufficient documentation

## 2022-06-21 DIAGNOSIS — D500 Iron deficiency anemia secondary to blood loss (chronic): Secondary | ICD-10-CM

## 2022-06-21 LAB — CBC WITH DIFFERENTIAL (CANCER CENTER ONLY)
Abs Immature Granulocytes: 0.18 10*3/uL — ABNORMAL HIGH (ref 0.00–0.07)
Basophils Absolute: 1 10*3/uL — ABNORMAL HIGH (ref 0.0–0.1)
Basophils Relative: 4 %
Eosinophils Absolute: 2.1 10*3/uL — ABNORMAL HIGH (ref 0.0–0.5)
Eosinophils Relative: 9 %
HCT: 30.1 % — ABNORMAL LOW (ref 36.0–46.0)
Hemoglobin: 9 g/dL — ABNORMAL LOW (ref 12.0–15.0)
Immature Granulocytes: 1 %
Lymphocytes Relative: 18 %
Lymphs Abs: 4.4 10*3/uL — ABNORMAL HIGH (ref 0.7–4.0)
MCH: 21 pg — ABNORMAL LOW (ref 26.0–34.0)
MCHC: 29.9 g/dL — ABNORMAL LOW (ref 30.0–36.0)
MCV: 70.3 fL — ABNORMAL LOW (ref 80.0–100.0)
Monocytes Absolute: 1.7 10*3/uL — ABNORMAL HIGH (ref 0.1–1.0)
Monocytes Relative: 7 %
Neutro Abs: 14.7 10*3/uL — ABNORMAL HIGH (ref 1.7–7.7)
Neutrophils Relative %: 61 %
Platelet Count: 1796 10*3/uL (ref 150–400)
RBC: 4.28 MIL/uL (ref 3.87–5.11)
RDW: 23.1 % — ABNORMAL HIGH (ref 11.5–15.5)
WBC Count: 24 10*3/uL — ABNORMAL HIGH (ref 4.0–10.5)
nRBC: 0.9 % — ABNORMAL HIGH (ref 0.0–0.2)

## 2022-06-21 LAB — IRON AND IRON BINDING CAPACITY (CC-WL,HP ONLY)
Iron: 9 ug/dL — ABNORMAL LOW (ref 28–170)
Saturation Ratios: 3 % — ABNORMAL LOW (ref 10.4–31.8)
TIBC: 314 ug/dL (ref 250–450)
UIBC: 305 ug/dL (ref 148–442)

## 2022-06-21 LAB — FERRITIN: Ferritin: 6 ng/mL — ABNORMAL LOW (ref 11–307)

## 2022-06-21 NOTE — Progress Notes (Signed)
Hematology and Oncology Follow Up Visit  Eileen [name] [id number] July 30, 1970 52 y.o. 06/21/2022 12:06 PM Eileen [name], MDRichter, Eileen Munroe, MD   Principle Diagnosis: 52 year old woman with JAK2 positive polycythemia vera diagnosed in 2017.    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  Therapeutic phlebotomy every 4 weeks to keep her hematocrit less than 45.  Treatment has been on hold since December 2022.  Current therapy:  Jakafi 10 mg daily started in December 2022.  Therapy interrupted for a month in between December 2023 and January 2024.  Eliquis 5 mg twice daily started in September 2023 after developing splenic vein thrombosis.  Interim History: Eileen [name] presents today for a follow-up visit.  Since last visit, she reports feeling well without any complaints.  I have asked her to stop the [facility] which she has for the last month or so.  She has reported having more vascular headaches at this time but no other complaints.  She denies any abdominal pain or distention.  She denies any shortness of breath or difficulty breathing.  She continues to be on Eliquis for anticoagulation.  She denies any bleeding complications.   Medications: Updated on review. Current Outpatient Medications  Medication Sig Dispense Refill   acetaminophen (TYLENOL) 500 MG tablet Take 1,000 mg by mouth every 6 (six) hours as needed for moderate pain or mild pain.     apixaban (ELIQUIS) 5 MG TABS tablet Take 1 tablet (5 mg total) by mouth 2 (two) times daily. 60 tablet 2   methocarbamol (ROBAXIN) 500 MG tablet Take 1 tablet (500 mg total) by mouth every 6 (six) hours as needed (use for muscle cramps/pain). 30 tablet 0   omeprazole (PRILOSEC OTC) 20 MG tablet Take 20 mg by mouth daily as needed (acid reflux).     ondansetron (ZOFRAN) 8 MG tablet Take 8 mg  by mouth every 8 (eight) hours as needed for nausea or vomiting.     oxyCODONE 10 MG TABS Take 1 tablet (10 mg total) by mouth every 6 (six) hours as needed for moderate pain (not releieved by tylenol). 25 tablet 0   No current facility-administered medications for this visit.   Facility-Administered Medications Ordered in Other Visits  Medication Dose Route Frequency Provider Last Rate Last Admin   gadopentetate dimeglumine (MAGNEVIST) injection 18 mL  18 mL Intravenous Once PRN Melvenia Beam, MD         Allergies: No Known Allergies    Physical Exam:          Blood pressure 127/85, pulse (!) 112, temperature 98 F (36.7 C), temperature source Temporal, resp. rate 18, height '5\' 3"'$  (1.6 m), weight 202 lb 4.8 oz (91.8 kg), SpO2 100 %.       ECOG: 0   General appearance: Comfortable appearing without any discomfort Head: Normocephalic without any trauma Oropharynx: Mucous membranes are moist and pink without any thrush or ulcers. Eyes: Pupils are equal and round reactive to light. Lymph nodes: No cervical, supraclavicular, inguinal or axillary lymphadenopathy.   Heart:regular rate and rhythm.  S1 and S2 without leg edema. Lung: Clear without any rhonchi or wheezes.  No dullness to percussion. Abdomin: Soft, nontender, nondistended with good bowel sounds.  No hepatosplenomegaly. Musculoskeletal: No joint deformity or effusion.  Full range of motion noted. Neurological: No deficits noted on motor, sensory and deep tendon reflex exam.  Skin: No petechial rash or dryness.  Appeared moist.                 Lab Results: Lab Results  Component Value Date   WBC 15.6 (H) 05/23/2022   HGB 9.5 (L) 05/23/2022   HCT 30.7 (L) 05/23/2022   MCV 71.6 (L) 05/23/2022   PLT 2,087 ([place]) 05/23/2022     Chemistry      Component Value Date/Time   NA 137 04/11/2022 2003   NA 140 02/20/2016 1432   K 3.7 04/11/2022 2003   K 4.1 02/20/2016 1432   CL 105 04/11/2022 2003    CO2 26 04/11/2022 2003   CO2 25 02/20/2016 1432   BUN 17 04/11/2022 2003   BUN 11.1 02/20/2016 1432   CREATININE 0.87 04/11/2022 2003   CREATININE 1.0 02/20/2016 1432      Component Value Date/Time   CALCIUM 8.6 (L) 04/11/2022 2003   CALCIUM 9.8 02/20/2016 1432   ALKPHOS 134 (H) 04/11/2022 2003   ALKPHOS 115 02/20/2016 1432   AST 13 (L) 04/11/2022 2003   AST 13 02/20/2016 1432   ALT 7 04/11/2022 2003   ALT 13 02/20/2016 1432   BILITOT 0.2 (L) 04/11/2022 2003   BILITOT 0.71 02/20/2016 1432         Impression and Plan:  52 year old woman with:  1.  Polycythemia vera diagnosed in 2017.  She was found to have JAK2 positive disease.  She had an excellent response to [facility] with improvement in her constitutional symptoms.  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   Risks and benefits of resuming this treatment were discussed at this time.  She has reported more constitutional symptoms off of it and after discussion I have recommended a trial of restarting it for the next 4 weeks and assess closely.  It is unclear if her recurrent thrombosis is related to her myeloproliferative disorder, splenectomy or Jakafi.   2.  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  Follow-up with vascular surgery is recommended.    4.  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  Her platelets are decreasing although her white cell count slightly up.  Restarting Jakafi might help with this issue.  5. Follow-up: In 4 weeks for a follow-up.  30  minutes were dedicated to this visit.  The time was spent on updating disease status, treatment choices and outlining future plan of care review.  Zola Button, MD 1/12/202412:06 PM

## 2022-07-12 ENCOUNTER — Inpatient Hospital Stay: Payer: No Typology Code available for payment source | Attending: Oncology

## 2022-07-12 ENCOUNTER — Inpatient Hospital Stay: Payer: No Typology Code available for payment source | Admitting: Hematology

## 2022-07-24 ENCOUNTER — Emergency Department: Payer: No Typology Code available for payment source

## 2022-07-24 ENCOUNTER — Emergency Department
Admission: EM | Admit: 2022-07-24 | Discharge: 2022-07-24 | Disposition: A | Discharge status: Home / Self Care | Payer: No Typology Code available for payment source | Attending: Emergency Medicine | Admitting: Emergency Medicine

## 2022-07-24 DIAGNOSIS — R0789 Other chest pain: Secondary | ICD-10-CM

## 2022-07-24 DIAGNOSIS — Z1152 Encounter for screening for COVID-19: Secondary | ICD-10-CM | POA: Insufficient documentation

## 2022-07-24 DIAGNOSIS — Z7901 Long term (current) use of anticoagulants: Secondary | ICD-10-CM | POA: Insufficient documentation

## 2022-07-24 DIAGNOSIS — D649 Anemia, unspecified: Secondary | ICD-10-CM | POA: Insufficient documentation

## 2022-07-24 LAB — BASIC METABOLIC PANEL
Anion gap: 7 (ref 5–15)
BUN: 16 mg/dL (ref 6–20)
CO2: 22 mmol/L (ref 22–32)
Calcium: 9.2 mg/dL (ref 8.9–10.3)
Chloride: 109 mmol/L (ref 98–111)
Creatinine, Ser: 0.73 mg/dL (ref 0.44–1.00)
GFR, Estimated: >60 mL/min (ref >60)
Glucose, Bld: 99 mg/dL (ref 70–99)
Potassium: 4.4 mmol/L (ref 3.5–5.1)
Sodium: 138 mmol/L (ref 135–145)

## 2022-07-24 LAB — CBC
HCT: 27.9 % — ABNORMAL LOW (ref 36.0–46.0)
Hemoglobin: 7.6 g/dL — ABNORMAL LOW (ref 12.0–15.0)
MCH: 19 pg — ABNORMAL LOW (ref 26.0–34.0)
MCHC: 27.2 g/dL — ABNORMAL LOW (ref 30.0–36.0)
MCV: 69.6 fL — ABNORMAL LOW (ref 80.0–100.0)
Platelets: 2337 10*3/uL (ref 150–400)
RBC: 4.01 MIL/uL (ref 3.87–5.11)
RDW: 22.5 % — ABNORMAL HIGH (ref 11.5–15.5)
WBC: 27.7 10*3/uL — ABNORMAL HIGH (ref 4.0–10.5)
nRBC: 1 % — ABNORMAL HIGH (ref 0.0–0.2)

## 2022-07-24 LAB — RESP PANEL BY RT-PCR (RSV, FLU A&B, COVID)  RVPGX2
Influenza A by PCR: NEGATIVE
Influenza B by PCR: NEGATIVE
Resp Syncytial Virus by PCR: NEGATIVE
SARS Coronavirus 2 by RT PCR: NEGATIVE

## 2022-07-24 LAB — TROPONIN I (HIGH SENSITIVITY)
Troponin I (High Sensitivity): 5 ng/L (ref <18)
Troponin I (High Sensitivity): 5 ng/L (ref <18)

## 2022-07-24 LAB — PATHOLOGIST SMEAR REVIEW

## 2022-07-24 MED ORDER — IOHEXOL 350 MG/ML SOLN
75.0000 mL | Freq: Once | INTRAVENOUS | Status: AC | PRN
  Administered 2022-07-24: 75 mL via INTRAVENOUS

## 2022-07-24 MED ORDER — TRAMADOL HCL 50 MG PO TABS: 50.0000 mg | ORAL_TABLET | Freq: Four times a day (QID) | ORAL | 0 refills | Status: AC | PRN

## 2022-07-24 MED ORDER — OXYCODONE-ACETAMINOPHEN 5-325 MG PO TABS
1.0000 | ORAL_TABLET | Freq: Once | ORAL | Status: AC
  Administered 2022-07-24: 1 via ORAL
  Filled 2022-07-24: qty 1

## 2022-07-24 NOTE — ED Notes (Signed)
Blue top tube sent to lab with other bloodwork.

## 2022-07-24 NOTE — ED Triage Notes (Signed)
Pt reports intense chest heaviness waking her up around 3 today. Pt took tylenol and went back to sleep. Pt went to work and pressure came back. Pt on blood thinner due to polycythemia. Denies any SOB.

## 2022-07-24 NOTE — ED Provider Notes (Signed)
[facility] Provider Note    Event Date/Time   First MD Initiated Contact with Patient 07/24/22 [phone]     (approximate)   History   Chest Pain   HPI  Eileen [name] is a 52 y.o. female with a history of polycythemia vera, migraine, anemia, and GERD who presents with chest pain, acute onset early this morning around 3 AM and described as pressure-like and feeling like a "pile of bricks" was on her chest.  She took Tylenol and it improved somewhat.  She went back to sleep and then woke up around 630 without any pain, but then it recurred.  The patient then became somewhat anxious and was given a Xanax by family member.  She states that right now the pressure still there but it is a lot better.  She reports pain on deep inspiration but no significant shortness of breath.  She does feel lightheaded.  She has no nausea or vomiting.  She denies prior history of similar pain.  The patient is on Eliquis due to polycythemia vera and states that she is diligent about taking it.  I reviewed the past medical records.  The patient was admitted in October of last year for splenomegaly, infarcts, and subsequently a splenectomy.  Postoperative course was uncomplicated.     Physical Exam   Triage Vital Signs: ED Triage Vitals  Enc Vitals Group     BP 07/24/22 0918 (!) 147/84     Pulse Rate 07/24/22 0918 (!) 116     Resp 07/24/22 0918 18     Temp 07/24/22 0918 98.2 F (36.8 C)     Temp Source 07/24/22 0918 Oral     SpO2 07/24/22 0918 100 %     Weight 07/24/22 0919 199 lb (90.3 kg)     Height 07/24/22 0919 5' 3"$  (1.6 m)     Head Circumference --      Peak Flow --      Pain Score 07/24/22 0916 5     Pain Loc --      Pain Edu? --      Excl. in [place]? --     Most recent vital signs: Vitals:   07/24/22 0918 07/24/22 1219  BP: (!) 147/84 133/82  Pulse: (!) 116 (!) 106  Resp: 18 17  Temp: 98.2 F (36.8 C)   SpO2: 100% 100%     General: Awake, no distress.   CV:  Good peripheral perfusion.  Normal heart sounds. Resp:  Normal effort.  Lungs CTAB. Abd:  No distention.  Other:  No calf or popliteal swelling or tenderness.  No peripheral edema.   ED Results / Procedures / Treatments   Labs (all labs ordered are listed, but only abnormal results are displayed) Labs Reviewed  CBC - Abnormal; Notable for the following components:      Result Value   WBC 27.7 (*)    Hemoglobin 7.6 (*)    HCT 27.9 (*)    MCV 69.6 (*)    MCH 19.0 (*)    MCHC 27.2 (*)    RDW 22.5 (*)    Platelets 2,337 (*)    nRBC 1.0 (*)    All other components within normal limits  RESP PANEL BY RT-PCR (RSV, FLU A&B, COVID)  RVPGX2  BASIC METABOLIC PANEL  PATHOLOGIST SMEAR REVIEW  POC URINE PREG, ED  TROPONIN I (HIGH SENSITIVITY)  TROPONIN I (HIGH SENSITIVITY)     EKG  ED ECG REPORT I, Felix Ahmadi  [name], the attending physician, personally viewed and interpreted this ECG.  Date: 07/24/2022 EKG Time: 0919 Rate: 112  Rhythm: normal sinus rhythm QRS Axis: normal Intervals: normal ST/T Wave abnormalities: normal Narrative Interpretation: no evidence of acute ischemia    RADIOLOGY  Chest x-ray: I independently viewed and interpreted the images; there is no focal consolidation or edema   PROCEDURES:  Critical Care performed: No  Procedures   MEDICATIONS ORDERED IN ED: Medications  iohexol (OMNIPAQUE) 350 MG/ML injection 75 mL (75 mLs Intravenous Contrast Given 07/24/22 1010)  oxyCODONE-acetaminophen (PERCOCET/ROXICET) 5-325 MG per tablet 1 tablet (1 tablet Oral Given 07/24/22 1241)     IMPRESSION / MDM / ASSESSMENT AND PLAN / ED COURSE  I reviewed the triage vital signs and the nursing notes.  52 year old female with PMH as noted above presents with chest pain and tachycardia.  Physical exam is otherwise unremarkable for acute findings.  EKG is nonischemic.  Differential diagnosis includes, but is not limited to, musculoskeletal pain, GERD,  anxiety, pneumonia, COVID or other viral respiratory infection, radiculopathy or other neurologic etiology, less likely ACS.  Given the history of polycythemia and need for anticoagulation as well as the tachycardia I am also concerned for PE.  We will obtain chest x-ray, lab workup including cardiac enzymes, and CT angio chest to rule out PE if no obvious findings on chest x-ray.  Patient's presentation is most consistent with acute presentation with potential threat to life or bodily function.  The patient is on the cardiac monitor to evaluate for evidence of arrhythmia and/or significant heart rate changes.  ----------------------------------------- 1:29 PM on 07/24/2022 -----------------------------------------  Chest x-ray showed no acute findings.  Respiratory panel is negative.  Troponins are negative x 2.  CT angio was obtained and is negative for acute PE or other acute findings.  CBC shows worsened anemia and increased platelets compared to recent baseline.  I consulted and discussed the case with Dr. Grayland Ormond from hematology/oncology who recommended that the patient take her [name] had been previously discontinued, and follow-up with her new hematologist [name].  I contacted [name] via secure chat to verify the follow-up plan.  I counseled the patient on the results of the workup and the plan of care.  She reports significant improved pain and her tachycardia has also improved.  At this time, she is appropriate for discharge home.  I gave her strict return precautions and she expressed understanding.  FINAL CLINICAL IMPRESSION(S) / ED DIAGNOSES   Final diagnoses:  Atypical chest pain     Rx / DC Orders   ED Discharge Orders          Ordered    traMADol (ULTRAM) 50 MG tablet  Every 6 hours PRN        07/24/22 1300             Note:  This document was prepared using Dragon voice recognition software and may include unintentional dictation errors.    Arta Silence, MD 07/24/22 1331

## 2022-07-24 NOTE — Discharge Instructions (Addendum)
Continue taking your [name] and other medications as prescribed.  You may take over-the-counter Tylenol if needed for pain and take the tramadol if this does not help.  We have contacted Dr. Darral Dash to help arrange for a sooner appointment.  You should call the office as well this week.  You should ideally follow-up within the next week.  In the meantime, return here or to the nearest emergency department for new, worsening, or persistent chest pain or body pain, weakness or lightheadedness, difficulty breathing, or any other new or worsening symptoms that concern you.

## 2022-08-08 ENCOUNTER — Ambulatory Visit: Payer: No Typology Code available for payment source | Admitting: Oncology

## 2022-08-08 ENCOUNTER — Other Ambulatory Visit: Payer: No Typology Code available for payment source

## 2022-08-09 ENCOUNTER — Inpatient Hospital Stay (HOSPITAL_BASED_OUTPATIENT_CLINIC_OR_DEPARTMENT_OTHER): Payer: No Typology Code available for payment source | Admitting: Hematology

## 2022-08-09 ENCOUNTER — Ambulatory Visit (HOSPITAL_COMMUNITY)
Admission: RE | Admit: 2022-08-09 | Discharge: 2022-08-09 | Disposition: A | Discharge status: Home / Self Care | Payer: No Typology Code available for payment source | Source: Ambulatory Visit | Attending: Hematology

## 2022-08-09 ENCOUNTER — Telehealth: Payer: Self-pay

## 2022-08-09 ENCOUNTER — Inpatient Hospital Stay: Payer: No Typology Code available for payment source | Attending: Oncology

## 2022-08-09 ENCOUNTER — Telehealth: Payer: Self-pay | Admitting: Internal Medicine

## 2022-08-09 ENCOUNTER — Inpatient Hospital Stay: Payer: No Typology Code available for payment source

## 2022-08-09 ENCOUNTER — Other Ambulatory Visit: Payer: Self-pay

## 2022-08-09 DIAGNOSIS — G43711 Chronic migraine without aura, intractable, with status migrainosus: Secondary | ICD-10-CM | POA: Diagnosis not present

## 2022-08-09 DIAGNOSIS — D649 Anemia, unspecified: Secondary | ICD-10-CM

## 2022-08-09 DIAGNOSIS — Z803 Family history of malignant neoplasm of breast: Secondary | ICD-10-CM | POA: Insufficient documentation

## 2022-08-09 DIAGNOSIS — D509 Iron deficiency anemia, unspecified: Secondary | ICD-10-CM | POA: Insufficient documentation

## 2022-08-09 DIAGNOSIS — D72829 Elevated white blood cell count, unspecified: Secondary | ICD-10-CM

## 2022-08-09 DIAGNOSIS — Z9081 Acquired absence of spleen: Secondary | ICD-10-CM | POA: Insufficient documentation

## 2022-08-09 DIAGNOSIS — Z7901 Long term (current) use of anticoagulants: Secondary | ICD-10-CM | POA: Insufficient documentation

## 2022-08-09 DIAGNOSIS — D500 Iron deficiency anemia secondary to blood loss (chronic): Secondary | ICD-10-CM

## 2022-08-09 DIAGNOSIS — D45 Polycythemia vera: Secondary | ICD-10-CM | POA: Insufficient documentation

## 2022-08-09 DIAGNOSIS — I7410 Embolism and thrombosis of unspecified parts of aorta: Secondary | ICD-10-CM | POA: Insufficient documentation

## 2022-08-09 DIAGNOSIS — D735 Infarction of spleen: Secondary | ICD-10-CM | POA: Insufficient documentation

## 2022-08-09 DIAGNOSIS — I82890 Acute embolism and thrombosis of other specified veins: Secondary | ICD-10-CM | POA: Diagnosis not present

## 2022-08-09 DIAGNOSIS — D75839 Thrombocytosis, unspecified: Secondary | ICD-10-CM | POA: Diagnosis not present

## 2022-08-09 DIAGNOSIS — R519 Headache, unspecified: Secondary | ICD-10-CM

## 2022-08-09 LAB — CBC WITH DIFFERENTIAL (CANCER CENTER ONLY)
Abs Immature Granulocytes: 0.1 10*3/uL — ABNORMAL HIGH (ref 0.00–0.07)
Basophils Absolute: 0.8 10*3/uL — ABNORMAL HIGH (ref 0.0–0.1)
Basophils Relative: 4 %
Eosinophils Absolute: 3.3 10*3/uL — ABNORMAL HIGH (ref 0.0–0.5)
Eosinophils Relative: 16 %
HCT: 25.3 % — ABNORMAL LOW (ref 36.0–46.0)
Hemoglobin: 7 g/dL — ABNORMAL LOW (ref 12.0–15.0)
Immature Granulocytes: 1 %
Lymphocytes Relative: 14 %
Lymphs Abs: 3 10*3/uL (ref 0.7–4.0)
MCH: 18.7 pg — ABNORMAL LOW (ref 26.0–34.0)
MCHC: 27.7 g/dL — ABNORMAL LOW (ref 30.0–36.0)
MCV: 67.6 fL — ABNORMAL LOW (ref 80.0–100.0)
Monocytes Absolute: 1.4 10*3/uL — ABNORMAL HIGH (ref 0.1–1.0)
Monocytes Relative: 7 %
Neutro Abs: 12.5 10*3/uL — ABNORMAL HIGH (ref 1.7–7.7)
Neutrophils Relative %: 58 %
Platelet Count: 1489 10*3/uL (ref 150–400)
RBC: 3.74 MIL/uL — ABNORMAL LOW (ref 3.87–5.11)
RDW: 21.5 % — ABNORMAL HIGH (ref 11.5–15.5)
WBC Count: 21.2 10*3/uL — ABNORMAL HIGH (ref 4.0–10.5)
nRBC: 3.2 % — ABNORMAL HIGH (ref 0.0–0.2)

## 2022-08-09 LAB — VITAMIN B12: Vitamin B-12: 312 pg/mL (ref 180–914)

## 2022-08-09 LAB — CMP (CANCER CENTER ONLY)
ALT: 16 U/L (ref 0–44)
AST: 20 U/L (ref 15–41)
Albumin: 3.7 g/dL (ref 3.5–5.0)
Alkaline Phosphatase: 122 U/L (ref 38–126)
Anion gap: 7 (ref 5–15)
BUN: 12 mg/dL (ref 6–20)
CO2: 23 mmol/L (ref 22–32)
Calcium: 8.8 mg/dL — ABNORMAL LOW (ref 8.9–10.3)
Chloride: 105 mmol/L (ref 98–111)
Creatinine: 0.76 mg/dL (ref 0.44–1.00)
GFR, Estimated: >60 mL/min (ref >60)
Glucose, Bld: 86 mg/dL (ref 70–99)
Potassium: 3.6 mmol/L (ref 3.5–5.1)
Sodium: 135 mmol/L (ref 135–145)
Total Bilirubin: <0.1 mg/dL — ABNORMAL LOW (ref 0.3–1.2)
Total Protein: 8.3 g/dL — ABNORMAL HIGH (ref 6.5–8.1)

## 2022-08-09 LAB — RETICULOCYTES
Immature Retic Fract: 28.6 % — ABNORMAL HIGH (ref 2.3–15.9)
RBC.: 3.88 MIL/uL (ref 3.87–5.11)
Retic Count, Absolute: 69.8 10*3/uL (ref 19.0–186.0)
Retic Ct Pct: 1.8 % (ref 0.4–3.1)

## 2022-08-09 LAB — LACTATE DEHYDROGENASE: LDH: 217 U/L — ABNORMAL HIGH (ref 98–192)

## 2022-08-09 LAB — FERRITIN: Ferritin: 6 ng/mL — ABNORMAL LOW (ref 11–307)

## 2022-08-09 LAB — ACETAMINOPHEN LEVEL: Acetaminophen (Tylenol), Serum: 88 ug/mL — ABNORMAL HIGH (ref 10–30)

## 2022-08-09 LAB — IRON AND IRON BINDING CAPACITY (CC-WL,HP ONLY)
Iron: 6 ug/dL — ABNORMAL LOW (ref 28–170)
Saturation Ratios: 2 % — ABNORMAL LOW (ref 10.4–31.8)
TIBC: 372 ug/dL (ref 250–450)
UIBC: 366 ug/dL (ref 148–442)

## 2022-08-09 LAB — PROTIME-INR
INR: 1.2 (ref 0.8–1.2)
Prothrombin Time: 15.5 seconds — ABNORMAL HIGH (ref 11.4–15.2)

## 2022-08-09 LAB — APTT: aPTT: 40 seconds — ABNORMAL HIGH (ref 24–36)

## 2022-08-09 LAB — PREPARE RBC (CROSSMATCH)

## 2022-08-09 MED ORDER — GADOBUTROL 1 MMOL/ML IV SOLN
9.0000 mL | Freq: Once | INTRAVENOUS | Status: AC | PRN
  Administered 2022-08-09: 9 mL via INTRAVENOUS

## 2022-08-09 MED ORDER — HYDROXYUREA 500 MG PO CAPS: 500.0000 mg | ORAL_CAPSULE | Freq: Every day | ORAL | 1 refills | Status: DC

## 2022-08-09 NOTE — Telephone Encounter (Signed)
Scheduled appt per 3/1 referral. Pt is aware of appt date and time. Pt is aware to arrive 15 mins prior to appt time and to bring and updated insurance card. Pt is aware of appt location.   ?

## 2022-08-09 NOTE — Telephone Encounter (Signed)
CRITICAL VALUE STICKER  CRITICAL VALUE:    Platelets  [phone]  RECEIVER (on-site recipient of call):  Rondel Baton, LPN  [place] NOTIFIED: 08/09/22  10:52  MESSENGER (representative from lab):  Pam  MD NOTIFIED: [place]:  10:52  RESPONSE:

## 2022-08-09 NOTE — Progress Notes (Signed)
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 female who has been a previous patient of Dr. 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  She was last seen by Dr. [name] on 06/21/2022 and complained of frequent vascular headaches, but was doing otherwise well overall.  Today, she is accompanied by her brother.  She confirms that her platelets have been high recently. She reports that when she was first diagnosed she was only getting phlebotomies and had continued this for 7 years. However, at the beginning of last year she needed consideration for additional treatment with [facility] for increase in her platelet counts and significant splenomegaly..  She reports that her spleen had been enlarged and she had not been symptomatic upfront. 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 The pain and spleen size limited her consumption of food. Patent reports that she was anemic prior to her splenectomy, though she is unsure of the cause.   She was initially on hydroxyurea for a short time, about a few months max, and she believes it was not suppressing her platelets. She was switched to [employer] year 5 MG twice a day, which did improve her platelets. She has been on Eliquis since November, 2024. Since then, her platelets have been very elevated. She has not previously been on any blood thinners or Aspirin.  Patient reports that Jakafi did not shrink her spleen. She discontinued it as she was told it may have a tendency to cause clots. She was then restarted on the original dose once a day after her splenectomy.  Patient was previously given narcotics by her PCP for spleen issues, which cause her to fall asleep.   She never had a bone marrow biopsy and patient's last phlebotomy was May 2022. She reports that she previously received oral iron which Irritated her ulcer. She denies any bleeding concerns.  She complains of severe headaches that began 6 months ago. She does report a Fhx of migraines. Her PCP has not ordered any scans previously to further evaluate. Patient is not on any preventative medication for her migraines.   She presented to the ED on 07/24/22 and complained of chest pain, breathing issues, pain in left arm. She describes that she felt like she was having a heart attack. To control this pain, patient regularly takes an exceedingly high dose of Tylenol 400-500 MG 5-6 times a day. She has been taking this Tylenol dose at least since September, 2023. She last took 4 tablets of Tylenol this morning. Patient has not had any chest pain since her ED visit.   She denies any change in vision, speech, or hearing, and no new weakness in her hands or feet. No chemical exposure or leg swelling. Patient reports that she only drinks 1-2 glasses of water a day.  She does complain of back pain which began October/November 2023. She presented to the chiropractor which had improved symptoms. She has a sedentary work lifestyle. She does take muscle relaxers to improve her back pain. Patient also complains of sudden muscle spasms that cause excruciating body pain.  MEDICAL HISTORY:  Past Medical History:  Diagnosis Date   Acute duodenal ulcer  without mention of hemorrhage, perforation, or obstruction    Anemia    due to the polycythemia   COVID 2022   mild case   Dysrhythmia    History of palpations, for many years   GERD (gastroesophageal reflux disease)    Headache    takes Advil   Polycythemia vera ([place])    Reflux esophagitis     SURGICAL HISTORY: Past Surgical History:  Procedure Laterality  Date   ABDOMINAL HYSTERECTOMY  2011ish   BIOPSY  07/11/2020   Procedure: BIOPSY;  Surgeon: Mauri Pole, MD;  Location: [facility];  Service: Endoscopy;;   CESAREAN SECTION     x 3    CHOLECYSTECTOMY     ESOPHAGOGASTRODUODENOSCOPY (EGD) WITH PROPOFOL N/A 07/11/2020   Procedure: ESOPHAGOGASTRODUODENOSCOPY (EGD) WITH PROPOFOL;  Surgeon: Mauri Pole, MD;  Location: [facility];  Service: Endoscopy;  Laterality: N/A;   ORIF ANKLE FRACTURE Left 12/21/2014   Procedure: OPEN REDUCTION INTERNAL FIXATION (ORIF) LEFT LATERAL MALLEOLUS ANKLE FRACTURE;  Surgeon: Leandrew Koyanagi, MD;  Location: [place];  Service: Orthopedics;  Laterality: Left;  Needs RNFA   SPLENECTOMY, TOTAL N/A 04/02/2022   Procedure: OPEN SPLENECTOMY;  Surgeon: Erroll Luna, MD;  Location: [place];  Service: General;  Laterality: N/A;  TAP BLOCK   UPPER GI ENDOSCOPY      SOCIAL HISTORY: Social History   Socioeconomic History   Marital status: Married    Spouse name: Not on file   Number of children: 3   Years of education: Not on file   Highest education level: Not on file  Occupational History   Occupation: Psychologist, forensic ANALYST    Employer: [employer]  Tobacco Use   Smoking status: Never   Smokeless tobacco: Never  Vaping Use   Vaping Use: Never used  Substance and Sexual Activity   Alcohol use: No   Drug use: No   Sexual activity: Yes    Birth control/protection: Surgical  Other Topics Concern   Not on file  Social History Narrative   No caffeine    Right handed   Lives at home with her husband and daughter   Social Determinants of Health   Financial Resource Strain: Not on file  Food Insecurity: No Food Insecurity (02/28/2022)   Hunger Vital Sign    Worried About Running Out of Food in the Last Year: Never true    Ran Out of Food in the Last Year: Never true  Transportation Needs: No Transportation Needs (02/28/2022)   PRAPARE - Hydrologist (Medical): No     Lack of Transportation (Non-Medical): No  Physical Activity: Not on file  Stress: Not on file  Social Connections: Not on file  Intimate Partner Violence: Not At Risk (02/28/2022)   Humiliation, Afraid, Rape, and Kick questionnaire    Fear of Current or Ex-Partner: No    Emotionally Abused: No    Physically Abused: No    Sexually Abused: No    FAMILY HISTORY: Family History  Problem Relation Age of Onset   Migraines Mother    Breast cancer Other        Great MA    Colon cancer Neg Hx     ALLERGIES:  has No Known Allergies.  MEDICATIONS:  Current Outpatient Medications  Medication Sig Dispense Refill   acetaminophen (TYLENOL) 500 MG tablet Take 1,000 mg by mouth every 6 (six) hours as needed for moderate pain or mild pain.     apixaban (ELIQUIS)  5 MG TABS tablet Take 1 tablet (5 mg total) by mouth 2 (two) times daily. 60 tablet 2   methocarbamol (ROBAXIN) 500 MG tablet Take 1 tablet (500 mg total) by mouth every 6 (six) hours as needed (use for muscle cramps/pain). 30 tablet 0   omeprazole (PRILOSEC OTC) 20 MG tablet Take 20 mg by mouth daily as needed (acid reflux).     ondansetron (ZOFRAN) 8 MG tablet Take 8 mg by mouth every 8 (eight) hours as needed for nausea or vomiting.     oxyCODONE 10 MG TABS Take 1 tablet (10 mg total) by mouth every 6 (six) hours as needed for moderate pain (not releieved by tylenol). 25 tablet 0   No current facility-administered medications for this visit.   Facility-Administered Medications Ordered in Other Visits  Medication Dose Route Frequency Provider Last Rate Last Admin   gadopentetate dimeglumine (MAGNEVIST) injection 18 mL  18 mL Intravenous Once PRN Melvenia Beam, MD        REVIEW OF SYSTEMS:    10 Point review of Systems was done is negative except as noted above.  PHYSICAL EXAMINATION: ECOG PERFORMANCE STATUS: 1 - Symptomatic but completely ambulatory  VS stable GENERAL:alert, in no acute distress and comfortable SKIN: no  acute rashes, no significant lesions EYES: conjunctiva are pink and non-injected, sclera anicteric OROPHARYNX: MMM, no exudates, no oropharyngeal erythema or ulceration NECK: supple, no JVD LYMPH:  no palpable lymphadenopathy in the cervical, axillary or inguinal regions LUNGS: clear to auscultation b/l with normal respiratory effort HEART: regular rate & rhythm ABDOMEN:  normoactive bowel sounds , non tender, not distended. Extremity: no pedal edema PSYCH: alert & oriented x 3 with fluent speech NEURO: no focal motor/sensory deficits  LABORATORY DATA:  I have reviewed the data as listed .    Latest Ref Rng & Units 08/12/2022    8:09 AM 08/09/2022   10:31 AM 07/24/2022    9:21 AM  CBC  WBC 4.0 - 10.5 K/uL 21.8  21.2  27.7   Hemoglobin 12.0 - 15.0 g/dL 7.9  7.0  7.6   Hematocrit 36.0 - 46.0 % 28.4  25.3  27.9   Platelets 150 - 400 K/uL 1,192  1,489  2,337    .    Latest Ref Rng & Units 08/09/2022   12:02 PM 07/24/2022    9:21 AM 04/11/2022    8:03 PM  CMP  Glucose 70 - 99 mg/dL 86  99  98   BUN 6 - 20 mg/dL '12  16  17   '$ Creatinine 0.44 - 1.00 mg/dL 0.76  0.73  0.87   Sodium 135 - 145 mmol/L 135  138  137   Potassium 3.5 - 5.1 mmol/L 3.6  4.4  3.7   Chloride 98 - 111 mmol/L 105  109  105   CO2 22 - 32 mmol/L '23  22  26   '$ Calcium 8.9 - 10.3 mg/dL 8.8  9.2  8.6   Total Protein 6.5 - 8.1 g/dL 8.3   6.6   Total Bilirubin 0.3 - 1.2 mg/dL <0.1   0.2   Alkaline Phos 38 - 126 U/L 122   134   AST 15 - 41 U/L 20   13   ALT 0 - 44 U/L 16   7    . Lab Results  Component Value Date   LDH 217 (H) 08/09/2022     RADIOGRAPHIC STUDIES: I have personally reviewed the radiological images as listed and agreed  with the findings in the report. CT Angio Chest PE W and/or Wo Contrast  Result Date: 07/24/2022 CLINICAL DATA:  Intense chest heaviness, on anticoagulation. EXAM: CT ANGIOGRAPHY CHEST WITH CONTRAST TECHNIQUE: Multidetector CT imaging of the chest was performed using the standard  protocol during bolus administration of intravenous contrast. Multiplanar CT image reconstructions and MIPs were obtained to evaluate the vascular anatomy. RADIATION DOSE REDUCTION: This exam was performed according to the departmental dose-optimization program which includes automated exposure control, adjustment of the mA and/or kV according to patient size and/or use of iterative reconstruction technique. CONTRAST:  72m OMNIPAQUE IOHEXOL 350 MG/ML SOLN COMPARISON:  02/27/2022. FINDINGS: Cardiovascular: Negative for pulmonary embolus. Heart is enlarged. No pericardial effusion. Mediastinum/Nodes: Small thoracic inlet lymph nodes are not enlarged by CT size criteria. Mediastinal lymph nodes measure up to 9 mm in the low right paratracheal station, unchanged. No hilar or axillary adenopathy. Esophagus is grossly unremarkable. Lungs/Pleura: Image quality is degraded by expiratory phase imaging and respiratory motion, creating added density in the lungs bilaterally. No pleural fluid. Airway is otherwise unremarkable. Upper Abdomen: Visualized portions of the liver, adrenal glands and kidneys are unremarkable. Splenectomy. Visualized portions of the pancreas, stomach and bowel are grossly unremarkable. No upper abdominal adenopathy. Musculoskeletal: Degenerative changes in the spine. Review of the MIP images confirms the above findings. IMPRESSION: Negative for pulmonary embolus. Electronically Signed   By: MLorin PicketM.D.   On: 07/24/2022 10:26   DG Chest 2 View  Result Date: 07/24/2022 CLINICAL DATA:  Provided history: Chest pain. Cough. Shortness of breath. Fatigue. EXAM: CHEST - 2 VIEW COMPARISON:  Prior chest radiographs 12/06/2019 and earlier. Chest CT 02/27/2022. FINDINGS: Heart size within normal limits. No appreciable airspace consolidation. No evidence of pleural effusion or pneumothorax. No acute osseous abnormality is identified. IMPRESSION: No evidence of active cardiopulmonary disease.  Electronically Signed   By: KKellie SimmeringD.O.   On: 07/24/2022 09:52    ASSESSMENT & PLAN:   Wonderful 52year old female with:   1.  Polycythemia vera diagnosed in 2017.  She was found to have JAK2 positive disease.  [facility].  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   2.  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  Follow-up with vascular surgery was recommended.   4.  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  Her platelets were decreasing although her white cell count slightly up.  Restarting Jakafi might help with this issue.  \5. S/p Splenectomy PLAN:  -Discussed lab results on 08/09/22 with patient. CBC showed WBC of 21.2K, hemoglobin of 7, and platelets of 1,489K -patient's platelets have been uncontrolled for month now, which is concerning -Educated patient that platelet levels in the millions increase risk of both blood clots and risk of bleeding -if LDL normal, chance of severe scarring from myefibrosis wouls be relatively low -patient is anemic, may be due to iron deficiency -correcting anemia annd thrombocytosis may improve patient's headaches splenectomy was October 2023, patient's spleen size was 20 cm -Extramedullary haemopoiesis in spleen is concerning. New blood forming cells growing in spleen. Could by polycythemia or scarring in the bone marrow, myelofibrosis. Either of these could be progressing at this time.  -Discussed hydroxyurea referred as frontline  treatment may restart hydroxyurea as pt has not had a full trial of this medication with platelet <600K and below is primary goal.  -discussed risk of blood clots with no  treatment -Advised patient to discontinue her exceedingly high dose of daily Tylenol as there is significant concern for liver failure -informed patient that recurrent large dose of Tylenol itself may worsen headache -Refer patient to neurologist to further evaluate severe headaches -Recommend to evaluate liver to ensure blood is clotting normally -recommend bone marrow biopsy to ensure polycythemia or myelofibrosis are not progressing -if iron deficient, patient may need to receive iron -blood clot in splenic and portal vein found -Advised patient to hold Jakafi at this time -will see if bone marrow biopsy will reveal either proliferative cells or signs of scarring -Recommend patient to receive head scan to ensure there are no blood clots in the brain -discussed option to send patient to emergency room to scan head due to frequent severe headaches -informed patient that if patient presents to the emergency room to receive a head scan and bone marrow biopsy, this would be a quicker process as opposed to receiving outpatient care -Provided patient with option of presenting to the emergency room if her symptoms worsen as she is asymptomatic in clinic today -Patient would like to hold off on presenting to the emergency room at this time, but may do so once her symptoms recurr  -Patient will FU with her PCP later today -discussed the details of the bone marrow biopsy with patient and her brother -answered all of patient's and patient's brother's questions in detail -recommend patient to drink at least 64 ounces of water daily  -order blood tests -order MRI  FOLLOW-UP: Additional labs today CT bone marrow aspiration and biopsy ASAP MRI and MRV of brain Referral to Dr Vaslow/Neurology for severe headaches Venofer '200mg'$  IV weekly x 3 doses RTC with Dr Irene Limbo in 10-12 days  The total time spent in the appointment was 63 minutes* .  All of the patient's questions were answered with apparent  satisfaction. The patient knows to call the clinic with any problems, questions or concerns.   Sullivan Lone MD MS AAHIVMS [facility] Hematology/Oncology Physician [facility]  .*Total Encounter Time as defined by the Centers for Medicare and Medicaid Services includes, in addition to the face-to-face time of a patient visit (documented in the note above) non-face-to-face time: obtaining and reviewing outside history, ordering and reviewing medications, tests or procedures, care coordination (communications with other health care professionals or caregivers) and documentation in the medical record.   I,Mitra Faeizi,acting as a Education administrator for Sullivan Lone, MD.,have documented all relevant documentation on the behalf of Sullivan Lone, MD,as directed by  Sullivan Lone, MD while in the presence of Sullivan Lone, MD.  .I have reviewed the above documentation for accuracy and completeness, and I agree with the above. Brunetta Genera MD  ADDENDUM  .IR BONE MARROW BIOPSY & ASPIRATION  Result Date: 08/12/2022 INDICATION: Polycythemia vera EXAM: FLUOROSCOPIC GUIDED BONE MARROW BIOPSY AND ASPIRATION MEDICATIONS: 10 mL lidocaine 1%.  25 mg Benadryl IV. FLUOROSCOPY TIME:  Fluoroscopic dose; 1 mGy ANESTHESIA/SEDATION: Moderate (conscious) sedation was employed during this procedure as administered by the Interventional Radiology RN. A total of Versed 4 mg and Fentanyl 100 mcg was administered intravenously. Moderate Sedation Time: 10 minutes. The patient's level of consciousness and vital signs were monitored continuously by radiology nursing throughout the procedure under my direct supervision. COMPLICATIONS: None immediate. PROCEDURE: Informed consent  was obtained from the patient following an explanation of the procedure, risks, benefits and alternatives. The patient understands, agrees and consents for the procedure. All questions were addressed. A time out was performed prior to the initiation of the  procedure. The patient was positioned prone on the fluoroscopy table and the posterior aspect of the RIGHT iliac crest was marked fluoroscopically. The operative site was prepped and draped in the usual sterile fashion. Under sterile conditions and local anesthesia, an 11 gauge coaxial bone biopsy needle was advanced into the posterior aspect of the RIGHT iliac marrow space under intermittent fluoroscopic guidance. Multiple fluoroscopic images were saved procedural documentation purposes. Initially, a bone marrow aspiration was performed. Next, a bone marrow biopsy was obtained with the 11 gauge outer bone marrow device. Samples were prepared with the cytotechnologist and deemed adequate. The needle was removed and superficial hemostasis was obtained with manual compression. A dressing was applied. The patient tolerated the procedure well without immediate post procedural complication. IMPRESSION: Successful fluoroscopic guided RIGHT iliac bone marrow aspiration and core biopsy. Michaelle Birks, MD [facility] Radiology Electronically Signed   By: Michaelle Birks M.D.   On: 08/12/2022 10:42   MR Brain W Wo Contrast  Result Date: 08/10/2022 CLINICAL DATA:  Initial evaluation for headache. EXAM: MRI HEAD WITHOUT AND WITH CONTRAST MRV HEAD WITHOUT CONTRAST TECHNIQUE: Multiplanar, multiecho pulse sequences of the brain and surrounding structures were obtained without and with intravenous contrast. Angiographic images of the intracranial venous structures were obtained using MRV technique without intravenous contrast. CONTRAST:  95m GADAVIST GADOBUTROL 1 MMOL/ML IV SOLN COMPARISON:  Comparison made with prior study from 07/29/2021. FINDINGS: MRI HEAD FINDINGS Brain: Cerebral volume within normal limits for age. No focal parenchymal signal abnormality. No abnormal foci of restricted diffusion to suggest acute or subacute ischemia. Gray-white matter differentiation well  maintained. No encephalomalacia to suggest chronic cortical infarction or other insult. No foci of susceptibility artifact indicative of acute or chronic intracranial blood products. No mass lesion, midline shift or mass effect. Ventricles normal in size and morphology without hydrocephalus. No extra-axial fluid collection. Pituitary gland and suprasellar region within normal limits. No abnormal enhancement. Vascular: Major intracranial vascular flow voids are well maintained. Skull and upper cervical spine: Craniocervical junction within normal limits. Diffuse loss of normal bone marrow signal, presumably related to history of polycythemia vera. No focal marrow replacing lesion. No scalp soft tissue abnormality. Sinuses/Orbits: Globes and orbital soft tissues are within normal limits. Paranasal sinuses are largely clear. No significant mastoid effusion. Other: None. MRV HEAD FINDINGS Normal flow related signal and enhancement seen throughout the superior sagittal sinus to the torcula. Transverse and sigmoid sinuses are patent as are the jugular bulbs and visualized proximal internal jugular veins. Straight sinus, vein of Galen, and internal cerebral veins are patent. No appreciable abnormality about the cavernous sinus. No cortical vein thrombosis. IMPRESSION: 1. Normal brain MRI. No acute intracranial abnormality. 2. Normal intracranial MRV. No evidence for dural sinus thrombosis. 3. Diffusely decreased T1 signal intensity throughout the visualized bone marrow, presumably related to history of polycythemia vera. Electronically Signed   By: BJeannine BogaM.D.   On: 08/10/2022 04:55   MR MRV HEAD W WO CONTRAST  Result Date: 08/10/2022 CLINICAL DATA:  Initial evaluation for headache. EXAM: MRI HEAD WITHOUT AND WITH CONTRAST MRV HEAD WITHOUT CONTRAST TECHNIQUE: Multiplanar, multiecho pulse sequences of the brain and surrounding structures were obtained without and with intravenous contrast. Angiographic  images of the intracranial  venous structures were obtained using MRV technique without intravenous contrast. CONTRAST:  91m GADAVIST GADOBUTROL 1 MMOL/ML IV SOLN COMPARISON:  Comparison made with prior study from 07/29/2021. FINDINGS: MRI HEAD FINDINGS Brain: Cerebral volume within normal limits for age. No focal parenchymal signal abnormality. No abnormal foci of restricted diffusion to suggest acute or subacute ischemia. Gray-white matter differentiation well maintained. No encephalomalacia to suggest chronic cortical infarction or other insult. No foci of susceptibility artifact indicative of acute or chronic intracranial blood products. No mass lesion, midline shift or mass effect. Ventricles normal in size and morphology without hydrocephalus. No extra-axial fluid collection. Pituitary gland and suprasellar region within normal limits. No abnormal enhancement. Vascular: Major intracranial vascular flow voids are well maintained. Skull and upper cervical spine: Craniocervical junction within normal limits. Diffuse loss of normal bone marrow signal, presumably related to history of polycythemia vera. No focal marrow replacing lesion. No scalp soft tissue abnormality. Sinuses/Orbits: Globes and orbital soft tissues are within normal limits. Paranasal sinuses are largely clear. No significant mastoid effusion. Other: None. MRV HEAD FINDINGS Normal flow related signal and enhancement seen throughout the superior sagittal sinus to the torcula. Transverse and sigmoid sinuses are patent as are the jugular bulbs and visualized proximal internal jugular veins. Straight sinus, vein of Galen, and internal cerebral veins are patent. No appreciable abnormality about the cavernous sinus. No cortical vein thrombosis. IMPRESSION: 1. Normal brain MRI. No acute intracranial abnormality. 2. Normal intracranial MRV. No evidence for dural sinus thrombosis. 3. Diffusely decreased T1 signal intensity throughout the visualized bone  marrow, presumably related to history of polycythemia vera. Electronically Signed   By: BJeannine BogaM.D.   On: 08/10/2022 04:55   CT Angio Chest PE W and/or Wo Contrast  Result Date: 07/24/2022 CLINICAL DATA:  Intense chest heaviness, on anticoagulation. EXAM: CT ANGIOGRAPHY CHEST WITH CONTRAST TECHNIQUE: Multidetector CT imaging of the chest was performed using the standard protocol during bolus administration of intravenous contrast. Multiplanar CT image reconstructions and MIPs were obtained to evaluate the vascular anatomy. RADIATION DOSE REDUCTION: This exam was performed according to the departmental dose-optimization program which includes automated exposure control, adjustment of the mA and/or kV according to patient size and/or use of iterative reconstruction technique. CONTRAST:  743mOMNIPAQUE IOHEXOL 350 MG/ML SOLN COMPARISON:  02/27/2022. FINDINGS: Cardiovascular: Negative for pulmonary embolus. Heart is enlarged. No pericardial effusion. Mediastinum/Nodes: Small thoracic inlet lymph nodes are not enlarged by CT size criteria. Mediastinal lymph nodes measure up to 9 mm in the low right paratracheal station, unchanged. No hilar or axillary adenopathy. Esophagus is grossly unremarkable. Lungs/Pleura: Image quality is degraded by expiratory phase imaging and respiratory motion, creating added density in the lungs bilaterally. No pleural fluid. Airway is otherwise unremarkable. Upper Abdomen: Visualized portions of the liver, adrenal glands and kidneys are unremarkable. Splenectomy. Visualized portions of the pancreas, stomach and bowel are grossly unremarkable. No upper abdominal adenopathy. Musculoskeletal: Degenerative changes in the spine. Review of the MIP images confirms the above findings. IMPRESSION: Negative for pulmonary embolus. Electronically Signed   By: MeLorin Picket.D.   On: 07/24/2022 10:26   DG Chest 2 View  Result Date: 07/24/2022 CLINICAL DATA:  Provided history:  Chest pain. Cough. Shortness of breath. Fatigue. EXAM: CHEST - 2 VIEW COMPARISON:  Prior chest radiographs 12/06/2019 and earlier. Chest CT 02/27/2022. FINDINGS: Heart size within normal limits. No appreciable airspace consolidation. No evidence of pleural effusion or pneumothorax. No acute osseous abnormality is identified. IMPRESSION: No evidence of active cardiopulmonary  disease. Electronically Signed   By: Kellie Simmering D.O.   On: 07/24/2022 09:52

## 2022-08-09 NOTE — Consult Note (Signed)
Chief Complaint: Patient was seen in consultation today for image guided bone marrow biopsy  Referring Physician(s): Brunetta Genera  Supervising Physician: Michaelle Birks  Patient Status: [facility] - Out-pt  History of Present Illness: Eileen [name] is a 52 y.o. 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 She also has myeloproliferative disorder and presents now with worsening anemia/thrombocytopenia. She is scheduled today for image guided bone marrow biopsy for further evaluation/rule out primary vs secondary myelofibrosis.   Past Medical History:  Diagnosis Date   Acute duodenal ulcer without mention of hemorrhage, perforation, or obstruction    Anemia    due to the polycythemia   COVID 2022   mild case   Dysrhythmia    History of palpations, for many years   GERD (gastroesophageal reflux disease)    Headache    takes Advil   Polycythemia vera ([place])    Reflux esophagitis     Past Surgical History:  Procedure Laterality Date   ABDOMINAL HYSTERECTOMY  2011ish   BIOPSY  07/11/2020   Procedure: BIOPSY;  Surgeon: Mauri Pole, MD;  Location: [facility];  Service: Endoscopy;;   CESAREAN SECTION     x 3    CHOLECYSTECTOMY     ESOPHAGOGASTRODUODENOSCOPY (EGD) WITH PROPOFOL N/A 07/11/2020   Procedure: ESOPHAGOGASTRODUODENOSCOPY (EGD) WITH PROPOFOL;  Surgeon: Mauri Pole, MD;  Location: [facility];  Service: Endoscopy;  Laterality: N/A;   ORIF ANKLE FRACTURE Left 12/21/2014   Procedure: OPEN REDUCTION INTERNAL FIXATION (ORIF) LEFT LATERAL MALLEOLUS ANKLE FRACTURE;  Surgeon: Leandrew Koyanagi, MD;  Location: [place];  Service: Orthopedics;  Laterality: Left;  Needs RNFA   SPLENECTOMY, TOTAL N/A 04/02/2022   Procedure: OPEN SPLENECTOMY;  Surgeon: Erroll Luna, MD;  Location: [place];  Service: General;  Laterality: N/A;  TAP  BLOCK   UPPER GI ENDOSCOPY      Allergies: Patient has no known allergies.  Medications: Prior to Admission medications   Medication Sig Start Date End Date Taking? Authorizing Provider  acetaminophen (TYLENOL) 500 MG tablet Take 1,000 mg by mouth every 6 (six) hours as needed for moderate pain or mild pain.    [provider]  apixaban (ELIQUIS) 5 MG TABS tablet Take 1 tablet (5 mg total) by mouth 2 (two) times daily. 04/19/22   [name], MD  hydroxyurea (HYDREA) 500 MG capsule Take 1 capsule (500 mg total) by mouth daily. May take with food to minimize GI side effects. Start after PRBC transfusion. 08/09/22   Brunetta Genera, MD  methocarbamol (ROBAXIN) 500 MG tablet Take 1 tablet (500 mg total) by mouth every 6 (six) hours as needed (use for muscle cramps/pain). Patient not taking: Reported on 08/09/2022 04/05/22   Jill Alexanders, PA-C  omeprazole (PRILOSEC OTC) 20 MG tablet Take 20 mg by mouth daily as needed (acid reflux).    [provider]  ondansetron (ZOFRAN) 8 MG tablet Take 8 mg by mouth every 8 (eight) hours as needed for nausea or vomiting. 02/27/22   [provider]  oxyCODONE 10 MG TABS Take 1 tablet (10 mg total) by mouth every 6 (six) hours as needed for moderate pain (not releieved by tylenol). Patient not taking: Reported on 08/09/2022 04/05/22   Jill Alexanders, PA-C     Family History  Problem Relation Age of Onset   Migraines Mother    Breast cancer Other  [place]    Colon cancer Neg Hx     Social History   Socioeconomic History   Marital status: Married    Spouse name: Not on file   Number of children: 3   Years of education: Not on file   Highest education level: Not on file  Occupational History   Occupation: Psychologist, forensic ANALYST    Employer: [employer]  Tobacco Use   Smoking status: Never   Smokeless tobacco: Never  Vaping Use   Vaping Use: Never used  Substance and Sexual Activity   Alcohol use:  No   Drug use: No   Sexual activity: Yes    Birth control/protection: Surgical  Other Topics Concern   Not on file  Social History Narrative   No caffeine    Right handed   Lives at home with her husband and daughter   Social Determinants of Health   Financial Resource Strain: Not on file  Food Insecurity: No Food Insecurity (02/28/2022)   Hunger Vital Sign    Worried About Running Out of Food in the Last Year: Never true    Ran Out of Food in the Last Year: Never true  Transportation Needs: No Transportation Needs (02/28/2022)   PRAPARE - Hydrologist (Medical): No    Lack of Transportation (Non-Medical): No  Physical Activity: Not on file  Stress: Not on file  Social Connections: Not on file      Review of Systems  Vital Signs:  Code Status:    Physical Exam  Imaging: CT Angio Chest PE W and/or Wo Contrast  Result Date: 07/24/2022 CLINICAL DATA:  Intense chest heaviness, on anticoagulation. EXAM: CT ANGIOGRAPHY CHEST WITH CONTRAST TECHNIQUE: Multidetector CT imaging of the chest was performed using the standard protocol during bolus administration of intravenous contrast. Multiplanar CT image reconstructions and MIPs were obtained to evaluate the vascular anatomy. RADIATION DOSE REDUCTION: This exam was performed according to the departmental dose-optimization program which includes automated exposure control, adjustment of the mA and/or kV according to patient size and/or use of iterative reconstruction technique. CONTRAST:  17m OMNIPAQUE IOHEXOL 350 MG/ML SOLN COMPARISON:  02/27/2022. FINDINGS: Cardiovascular: Negative for pulmonary embolus. Heart is enlarged. No pericardial effusion. Mediastinum/Nodes: Small thoracic inlet lymph nodes are not enlarged by CT size criteria. Mediastinal lymph nodes measure up to 9 mm in the low right paratracheal station, unchanged. No hilar or axillary adenopathy. Esophagus is grossly unremarkable. Lungs/Pleura:  Image quality is degraded by expiratory phase imaging and respiratory motion, creating added density in the lungs bilaterally. No pleural fluid. Airway is otherwise unremarkable. Upper Abdomen: Visualized portions of the liver, adrenal glands and kidneys are unremarkable. Splenectomy. Visualized portions of the pancreas, stomach and bowel are grossly unremarkable. No upper abdominal adenopathy. Musculoskeletal: Degenerative changes in the spine. Review of the MIP images confirms the above findings. IMPRESSION: Negative for pulmonary embolus. Electronically Signed   By: MLorin PicketM.D.   On: 07/24/2022 10:26   DG Chest 2 View  Result Date: 07/24/2022 CLINICAL DATA:  Provided history: Chest pain. Cough. Shortness of breath. Fatigue. EXAM: CHEST - 2 VIEW COMPARISON:  Prior chest radiographs 12/06/2019 and earlier. Chest CT 02/27/2022. FINDINGS: Heart size within normal limits. No appreciable airspace consolidation. No evidence of pleural effusion or pneumothorax. No acute osseous abnormality is identified. IMPRESSION: No evidence of active cardiopulmonary disease. Electronically Signed   By: KKellie SimmeringD.O.   On: 07/24/2022 09:52    Labs:  CBC: Recent  Labs    05/23/22 1042 06/21/22 1120 07/24/22 0921 08/09/22 1031  WBC 15.6* 24.0* 27.7* 21.2*  HGB 9.5* 9.0* 7.6* 7.0*  HCT 30.7* 30.1* 27.9* 25.3*  PLT 2,087* 1,796* 2,337* 1,489*    COAGS: Recent Labs    08/09/22 1202  INR 1.2  APTT 40*    BMP: Recent Labs    04/03/22 0522 04/11/22 2003 07/24/22 0921 08/09/22 1202  NA 137 137 138 135  K 4.2 3.7 4.4 3.6  CL 107 105 109 105  CO2 '22 26 22 23  '$ GLUCOSE 147* 98 99 86  BUN '7 17 16 12  '$ CALCIUM 8.7* 8.6* 9.2 8.8*  CREATININE 0.70 0.87 0.73 0.76  GFRNONAA >60 >60 >60 >60    LIVER FUNCTION TESTS: Recent Labs    02/28/22 0110 04/03/22 0522 04/11/22 2003 08/09/22 1202  BILITOT 0.9 0.5 0.2* <0.1*  AST 11* 24 13* 20  ALT '9 11 7 16  '$ ALKPHOS 82 72 134* 122  PROT 5.9* 5.6*  6.6 8.3*  ALBUMIN 2.7* 2.8* 3.3* 3.7    TUMOR MARKERS: No results for input(s): "AFPTM", "CEA", "CA199", "CHROMGRNA" in the last 8760 hours.  Assessment and Plan: 52 y.o. 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 She also has myeloproliferative disorder and presents now with worsening anemia/thrombocytopenia. She is scheduled today for image guided bone marrow biopsy for further evaluation/rule out primary vs secondary myelofibrosis. Risks and benefits of procedure was discussed with the patient  including, but not limited to bleeding, infection, damage to adjacent structures or low yield requiring additional tests.  All of the questions were answered and there is agreement to proceed.  Consent signed and in chart.    Thank you for this interesting consult.  I greatly enjoyed meeting [name] and look forward to participating in their care.  A copy of this report was sent to the requesting provider on this date.  Electronically Signed: D. Rowe Robert, PA-C 08/09/2022, 3:53 PM   I spent a total of 20 minutes  in face to face in clinical consultation, greater than 50% of which was counseling/coordinating care for image guided bone marrow biopsy

## 2022-08-10 ENCOUNTER — Inpatient Hospital Stay: Payer: No Typology Code available for payment source

## 2022-08-10 DIAGNOSIS — D500 Iron deficiency anemia secondary to blood loss (chronic): Secondary | ICD-10-CM

## 2022-08-10 DIAGNOSIS — D45 Polycythemia vera: Secondary | ICD-10-CM | POA: Diagnosis not present

## 2022-08-10 MED ORDER — SODIUM CHLORIDE 0.9% IV SOLUTION
250.0000 mL | Freq: Once | INTRAVENOUS | Status: AC
  Administered 2022-08-10: 250 mL via INTRAVENOUS

## 2022-08-10 MED ORDER — ACETAMINOPHEN 325 MG PO TABS
650.0000 mg | ORAL_TABLET | Freq: Once | ORAL | Status: DC
  Filled 2022-08-10: qty 2

## 2022-08-10 MED ORDER — METHYLPREDNISOLONE SODIUM SUCC 40 MG IJ SOLR
40.0000 mg | Freq: Once | INTRAMUSCULAR | Status: AC
  Administered 2022-08-10: 40 mg via INTRAVENOUS
  Filled 2022-08-10: qty 1

## 2022-08-10 NOTE — Patient Instructions (Signed)

## 2022-08-12 ENCOUNTER — Telehealth: Payer: Self-pay | Admitting: Hematology

## 2022-08-12 ENCOUNTER — Other Ambulatory Visit: Payer: Self-pay

## 2022-08-12 ENCOUNTER — Encounter: Payer: Self-pay | Admitting: Hematology

## 2022-08-12 ENCOUNTER — Ambulatory Visit (HOSPITAL_COMMUNITY)
Admission: RE | Admit: 2022-08-12 | Discharge: 2022-08-12 | Disposition: A | Discharge status: Home / Self Care | Payer: No Typology Code available for payment source | Source: Ambulatory Visit | Attending: Hematology | Admitting: Hematology

## 2022-08-12 ENCOUNTER — Encounter (HOSPITAL_COMMUNITY): Payer: Self-pay

## 2022-08-12 DIAGNOSIS — D471 Chronic myeloproliferative disease: Secondary | ICD-10-CM | POA: Diagnosis not present

## 2022-08-12 DIAGNOSIS — D7219 Other eosinophilia: Secondary | ICD-10-CM | POA: Insufficient documentation

## 2022-08-12 DIAGNOSIS — D509 Iron deficiency anemia, unspecified: Secondary | ICD-10-CM | POA: Diagnosis not present

## 2022-08-12 DIAGNOSIS — D75839 Thrombocytosis, unspecified: Secondary | ICD-10-CM | POA: Diagnosis not present

## 2022-08-12 DIAGNOSIS — K219 Gastro-esophageal reflux disease without esophagitis: Secondary | ICD-10-CM | POA: Insufficient documentation

## 2022-08-12 DIAGNOSIS — D45 Polycythemia vera: Secondary | ICD-10-CM | POA: Diagnosis present

## 2022-08-12 HISTORY — PX: IR BONE MARROW BIOPSY & ASPIRATION: IMG5727

## 2022-08-12 LAB — TYPE AND SCREEN
ABO/RH(D): B POS
Antibody Screen: NEGATIVE
Unit division: 0

## 2022-08-12 LAB — CBC WITH DIFFERENTIAL/PLATELET
Abs Immature Granulocytes: 0.13 10*3/uL — ABNORMAL HIGH (ref 0.00–0.07)
Basophils Absolute: 1.1 10*3/uL — ABNORMAL HIGH (ref 0.0–0.1)
Basophils Relative: 5 %
Eosinophils Absolute: 2.9 10*3/uL — ABNORMAL HIGH (ref 0.0–0.5)
Eosinophils Relative: 13 %
HCT: 28.4 % — ABNORMAL LOW (ref 36.0–46.0)
Hemoglobin: 7.9 g/dL — ABNORMAL LOW (ref 12.0–15.0)
Immature Granulocytes: 1 %
Lymphocytes Relative: 18 %
Lymphs Abs: 3.8 10*3/uL (ref 0.7–4.0)
MCH: 19.7 pg — ABNORMAL LOW (ref 26.0–34.0)
MCHC: 27.8 g/dL — ABNORMAL LOW (ref 30.0–36.0)
MCV: 70.6 fL — ABNORMAL LOW (ref 80.0–100.0)
Monocytes Absolute: 1.6 10*3/uL — ABNORMAL HIGH (ref 0.1–1.0)
Monocytes Relative: 7 %
Neutro Abs: 12.3 10*3/uL — ABNORMAL HIGH (ref 1.7–7.7)
Neutrophils Relative %: 56 %
Platelets: 1192 10*3/uL (ref 150–400)
RBC: 4.02 MIL/uL (ref 3.87–5.11)
RDW: 23.1 % — ABNORMAL HIGH (ref 11.5–15.5)
WBC: 21.8 10*3/uL — ABNORMAL HIGH (ref 4.0–10.5)
nRBC: 3.4 % — ABNORMAL HIGH (ref 0.0–0.2)

## 2022-08-12 LAB — BPAM RBC
Blood Product Expiration Date: 202403102359
ISSUE DATE / TIME: 202403021046
Unit Type and Rh: 7300

## 2022-08-12 MED ORDER — FENTANYL CITRATE (PF) 100 MCG/2ML IJ SOLN
INTRAMUSCULAR | Status: AC | PRN
  Administered 2022-08-12: 25 ug via INTRAVENOUS
  Administered 2022-08-12: 75 ug via INTRAVENOUS

## 2022-08-12 MED ORDER — MIDAZOLAM HCL 2 MG/2ML IJ SOLN
INTRAMUSCULAR | Status: AC
  Filled 2022-08-12: qty 2

## 2022-08-12 MED ORDER — DIPHENHYDRAMINE HCL 50 MG/ML IJ SOLN
INTRAMUSCULAR | Status: AC
  Filled 2022-08-12: qty 1

## 2022-08-12 MED ORDER — FENTANYL CITRATE (PF) 100 MCG/2ML IJ SOLN
INTRAMUSCULAR | Status: AC
  Filled 2022-08-12: qty 2

## 2022-08-12 MED ORDER — DIPHENHYDRAMINE HCL 50 MG/ML IJ SOLN
INTRAMUSCULAR | Status: AC | PRN
  Administered 2022-08-12: 25 mg via INTRAVENOUS

## 2022-08-12 MED ORDER — SODIUM CHLORIDE 0.9 % IV SOLN: INTRAVENOUS | Status: DC

## 2022-08-12 MED ORDER — LIDOCAINE HCL (PF) 1 % IJ SOLN
INTRAMUSCULAR | Status: AC
  Administered 2022-08-12: 10 mL
  Filled 2022-08-12: qty 30

## 2022-08-12 MED ORDER — LIDOCAINE HCL 1 % IJ SOLN
INTRAMUSCULAR | Status: AC
  Filled 2022-08-12: qty 20

## 2022-08-12 MED ORDER — MIDAZOLAM HCL 2 MG/2ML IJ SOLN
INTRAMUSCULAR | Status: AC | PRN
  Administered 2022-08-12 (×2): 1 mg via INTRAVENOUS
  Administered 2022-08-12: 1.5 mg via INTRAVENOUS
  Administered 2022-08-12: .5 mg via INTRAVENOUS

## 2022-08-12 NOTE — Discharge Instructions (Signed)
Please call Interventional Radiology clinic [phone] with any questions or concerns. ? ?You may remove your dressing and shower tomorrow. ? ? ?Bone Marrow Aspiration and Bone Marrow Biopsy, Adult, Care After ?This sheet gives you information about how to care for yourself after your procedure. Your health care provider may also give you more specific instructions. If you have problems or questions, contact your health care provider. ?What can I expect after the procedure? ?After the procedure, it is common to have: ?Mild pain and tenderness. ?Swelling. ?Bruising. ?Follow these instructions at home: ?Puncture site care ?Follow instructions from your health care provider about how to take care of the puncture site. Make sure you: ?Wash your hands with soap and water before and after you change your bandage (dressing). If soap and water are not available, use hand sanitizer. ?Change your dressing as told by your health care provider. ?Check your puncture site every day for signs of infection. Check for: ?More redness, swelling, or pain. ?Fluid or blood. ?Warmth. ?Pus or a bad smell.   ?Activity ?Return to your normal activities as told by your health care provider. Ask your health care provider what activities are safe for you. ?Do not lift anything that is heavier than 10 lb (4.5 kg), or the limit that you are told, until your health care provider says that it is safe. ?Do not drive for 24 hours if you were given a sedative during your procedure. ?General instructions ?Take over-the-counter and prescription medicines only as told by your health care provider. ?Do not take baths, swim, or use a hot tub until your health care provider approves. Ask your health care provider if you may take showers. You may only be allowed to take sponge baths. ?If directed, put ice on the affected area. To do this: ?Put ice in a plastic bag. ?Place a towel between your skin and the bag. ?Leave the ice on for 20 minutes, 2-3 times a  day. ?Keep all follow-up visits as told by your health care provider. This is important.   ?Contact a health care provider if: ?Your pain is not controlled with medicine. ?You have a fever. ?You have more redness, swelling, or pain around the puncture site. ?You have fluid or blood coming from the puncture site. ?Your puncture site feels warm to the touch. ?You have pus or a bad smell coming from the puncture site. ?Summary ?After the procedure, it is common to have mild pain, tenderness, swelling, and bruising. ?Follow instructions from your health care provider about how to take care of the puncture site and what activities are safe for you. ?Take over-the-counter and prescription medicines only as told by your health care provider. ?Contact a health care provider if you have any signs of infection, such as fluid or blood coming from the puncture site. ?This information is not intended to replace advice given to you by your health care provider. Make sure you discuss any questions you have with your health care provider. ?Document Revised: 10/13/2018 Document Reviewed: 10/13/2018 ?Elsevier Patient Education ? 2021 Elsevier Inc. ? ? ?Moderate Conscious Sedation, Adult, Care After ?This sheet gives you information about how to care for yourself after your procedure. Your health care provider may also give you more specific instructions. If you have problems or questions, contact your health care provider. ?What can I expect after the procedure? ?After the procedure, it is common to have: ?Sleepiness for several hours. ?Impaired judgment for several hours. ?Difficulty with balance. ?Vomiting if you eat too   soon. ?Follow these instructions at home: ?For the time period you were told by your health care provider: ?Rest. ?Do not participate in activities where you could fall or become injured. ?Do not drive or use machinery. ?Do not drink alcohol. ?Do not take sleeping pills or medicines that cause drowsiness. ?Do not  make important decisions or sign legal documents. ?Do not take care of children on your own.  ?  ?  ?Eating and drinking ?Follow the diet recommended by your health care provider. ?Drink enough fluid to keep your urine pale yellow. ?If you vomit: ?Drink water, juice, or soup when you can drink without vomiting. ?Make sure you have little or no nausea before eating solid foods.   ?General instructions ?Take over-the-counter and prescription medicines only as told by your health care provider. ?Have a responsible adult stay with you for the time you are told. It is important to have someone help care for you until you are awake and alert. ?Do not smoke. ?Keep all follow-up visits as told by your health care provider. This is important. ?Contact a health care provider if: ?You are still sleepy or having trouble with balance after 24 hours. ?You feel light-headed. ?You keep feeling nauseous or you keep vomiting. ?You develop a rash. ?You have a fever. ?You have redness or swelling around the IV site. ?Get help right away if: ?You have trouble breathing. ?You have new-onset confusion at home. ?Summary ?After the procedure, it is common to feel sleepy, have impaired judgment, or feel nauseous if you eat too soon. ?Rest after you get home. Know the things you should not do after the procedure. ?Follow the diet recommended by your health care provider and drink enough fluid to keep your urine pale yellow. ?Get help right away if you have trouble breathing or new-onset confusion at home. ?This information is not intended to replace advice given to you by your health care provider. Make sure you discuss any questions you have with your health care provider. ?Document Revised: 09/24/2019 Document Reviewed: 04/22/2019 ?Elsevier Patient Education ? 2021 Elsevier Inc.  ?

## 2022-08-12 NOTE — Procedures (Signed)
Vascular and Interventional Radiology Procedure Note  Patient: Eileen [name] [facility] DOB: 1970-11-02 Medical Record Number: [id number] Note Date/Time: 08/12/22 9:08 AM   Performing Physician: Michaelle Birks, MD Assistant(s): None  Diagnosis: Polycythemia vera.  Procedure: BONE MARROW ASPIRATION and BIOPSY  Anesthesia: Conscious Sedation Complications: None Estimated Blood Loss: Minimal Specimens: Sent for Pathology  Findings:  Successful Fluoroscopy-guided bone marrow aspiration and biopsy A total of 1 cores were obtained. Hemostasis of the tract was achieved using Manual Pressure.  Plan: Bed rest for 1 hours.  See detailed procedure note with images in PACS. The patient tolerated the procedure well without incident or complication and was returned to Recovery in stable condition.    Michaelle Birks, MD [facility]   Pager. [place]

## 2022-08-12 NOTE — Discharge Instructions (Signed)
Bone Marrow Aspiration and Bone Marrow Biopsy, Adult, Care After  The following information offers guidance on how to care for yourself after your procedure. Your health care provider may also give you more specific instructions. If you have problems or questions, contact your health care provider.  What can I expect after the procedure?  May remove dressing or bandaid and shower tomorrow.  Keep site clean and dry. Replace with clean dressing or bandaid as necessary. Urgent needs IR clinic [phone] (mon-fri 8-5).  After the procedure, it is common to have: Mild pain and tenderness. Swelling. Bruising. Follow these instructions at home: Incision care  Follow instructions from your health care provider about how to take care of the incision site. Make sure you: Wash your hands with soap and water for at least 20 seconds before and after you change your bandage (dressing). If soap and water are not available, use hand sanitizer. Change your dressing as told by your health care provider. Leave stitches (sutures), skin glue, or adhesive strips in place. These skin closures may need to stay in place for 2 weeks or longer. If adhesive strip edges start to loosen and curl up, you may trim the loose edges. Do not remove adhesive strips completely unless your health care provider tells you to do that. Check your incision site every day for signs of infection. Check for: More redness, swelling, or pain. Fluid or blood. Warmth. Pus or a bad smell. Activity Return to your normal activities as told by your health care provider. Ask your health care provider what activities are safe for you. Do not lift anything that is heavier than 10 lb (4.5 kg), or the limit that you are told, until your health care provider says that it is safe. If you were given a sedative during the procedure, it can affect you for  several hours. Do not drive or operate machinery until your health care provider says that it is safe. General instructions  Take over-the-counter and prescription medicines only as told by your health care provider. Do not take baths, swim, or use a hot tub until your health care provider approves. Ask your health care provider if you may take showers. You may only be allowed to take sponge baths. If directed, put ice on the affected area. To do this: Put ice in a plastic bag. Place a towel between your skin and the bag. Leave the ice on for 20 minutes, 2-3 times a day. If your skin turns bright red, remove the ice right away to prevent skin damage. The risk of skin damage is higher if you cannot feel pain, heat, or cold. Contact a health care provider if: You have signs of infection. Your pain is not controlled with medicine. You have cancer, and a temperature of 100.38F (38C) or higher. Get help right away if: You have a temperature of 101F (38.3C) or higher, or as told by your health care provider. You have bleeding from the incision site that cannot be controlled. This information is not intended to replace advice given to you by your health care provider. Make sure you discuss any questions you have with your health care provider. Document Revised: 10/01/2021 Document Reviewed: 10/01/2021 Elsevier Patient Education  [place].                            Moderate Conscious Sedation, Adult, Care After  This sheet gives you information about how to care  for yourself after your procedure. Your health care provider may also give you more specific instructions. If you have problems or questions, contact your health care provider. What can I expect after the procedure? After the procedure, it is common to have: Sleepiness for several hours. Impaired judgment for several hours. Difficulty with balance. Vomiting if you eat too soon. Follow these instructions at home: For  the time period you were told by your health care provider:     Rest. Do not participate in activities where you could fall or become injured. Do not drive or use machinery. Do not drink alcohol. Do not take sleeping pills or medicines that cause drowsiness. Do not make important decisions or sign legal documents. Do not take care of children on your own. Eating and drinking  Follow the diet recommended by your health care provider. Drink enough fluid to keep your urine pale yellow. If you vomit: Drink water, juice, or soup when you can drink without vomiting. Make sure you have little or no nausea before eating solid foods. General instructions Take over-the-counter and prescription medicines only as told by your health care provider. Have a responsible adult stay with you for the time you are told. It is important to have someone help care for you until you are awake and alert. Do not smoke. Keep all follow-up visits as told by your health care provider. This is important. Contact a health care provider if: You are still sleepy or having trouble with balance after 24 hours. You feel light-headed. You keep feeling nauseous or you keep vomiting. You develop a rash. You have a fever. You have redness or swelling around the IV site. Get help right away if: You have trouble breathing. You have new-onset confusion at home. Summary After the procedure, it is common to feel sleepy, have impaired judgment, or feel nauseous if you eat too soon. Rest after you get home. Know the things you should not do after the procedure. Follow the diet recommended by your health care provider and drink enough fluid to keep your urine pale yellow. Get help right away if you have trouble breathing or new-onset confusion at home. This information is not intended to replace advice given to you by your health care provider. Make sure you discuss any questions you have with your health care  provider. Document Revised: 09/24/2019 Document Reviewed: 04/22/2019 Elsevier Patient Education  [place].

## 2022-08-12 NOTE — Progress Notes (Signed)
Date and time results received: 08/12/22 0825  Test: Platelet  Critical Value: Platelets 1192  Name of Provider Notified: 08/12/22 @ 0830  Orders Received? Or Actions Taken?:  No orders revieved. Dr Maryelizabeth Kaufmann  made aware along with HGB of 7.9

## 2022-08-12 NOTE — Telephone Encounter (Signed)
Called patient per 3/1 los notes to schedule f/u. Patient scheduled and notified.

## 2022-08-14 LAB — SURGICAL PATHOLOGY

## 2022-08-14 LAB — JAK2 (INCLUDING V617F AND EXON 12), MPL,& CALR-NEXT GEN SEQ

## 2022-08-15 ENCOUNTER — Encounter: Payer: Self-pay | Admitting: Hematology

## 2022-08-15 ENCOUNTER — Inpatient Hospital Stay: Payer: No Typology Code available for payment source | Admitting: Internal Medicine

## 2022-08-15 ENCOUNTER — Telehealth: Payer: Self-pay | Admitting: Family Medicine

## 2022-08-15 VITALS — BP 132/82 | HR 103 | Temp 97.7°F | Resp 14 | Wt 203.4 lb

## 2022-08-15 DIAGNOSIS — G43711 Chronic migraine without aura, intractable, with status migrainosus: Secondary | ICD-10-CM | POA: Diagnosis not present

## 2022-08-15 DIAGNOSIS — D45 Polycythemia vera: Secondary | ICD-10-CM | POA: Diagnosis not present

## 2022-08-15 MED ORDER — PREDNISONE 50 MG PO TABS: 50.0000 mg | ORAL_TABLET | Freq: Every day | ORAL | 0 refills | Status: DC

## 2022-08-15 MED ORDER — PROPRANOLOL HCL 20 MG PO TABS: 20.0000 mg | ORAL_TABLET | Freq: Two times a day (BID) | ORAL | 2 refills | Status: DC

## 2022-08-15 NOTE — Telephone Encounter (Signed)
Per 3/7 LOS reached out to schedule follow up, patient aware of date and time of appointment.

## 2022-08-15 NOTE — Progress Notes (Signed)
[facility] [place], [place] [address] [phone]   New Patient Evaluation  Date of Service: 08/15/22 Patient Name: Eileen [name] Patient MRN: [id number] Patient DOB: 09/27/1970 Provider: Ventura Sellers, MD  Identifying Statement:  Eileen [name] is a 52 y.o. 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    Referring Provider: Brunetta Genera, MD [place],  [place] [id number]  Primary Diagnosis: polycythemia vera  History of Present Illness: The patient's records from the referring physician were obtained and reviewed and the patient interviewed to confirm this HPI.  Eileen [name] presents to review headache syndrome.  She describes ~1 month history of daily headache, described as "pressure in temples, more on the right side, 5/10 or worse, lasting all day, worse in the morning".  There is some associated photophobia and nausea, intermittent.  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  Initially this was for abdominal pain, post surgical, but this component had resolved.  Recently underwent MRI/MRV brain, no issue with the scan.  Following with [name] now for PCV.  Medications: Current Outpatient Medications on File Prior to Visit  Medication Sig Dispense Refill   acetaminophen (TYLENOL) 500 MG tablet Take 1,000 mg by mouth every 6 (six) hours as needed for moderate pain or mild pain.     apixaban (ELIQUIS) 5 MG TABS tablet Take 1 tablet (5 mg total) by mouth 2 (two) times daily. 60 tablet 2   hydroxyurea (HYDREA) 500 MG capsule Take 1 capsule (500 mg total) by mouth daily. May take with food to minimize GI side effects. Start after PRBC transfusion. 30 capsule 1   methocarbamol (ROBAXIN) 500 MG tablet Take  1 tablet (500 mg total) by mouth every 6 (six) hours as needed (use for muscle cramps/pain). 30 tablet 0   omeprazole (PRILOSEC OTC) 20 MG tablet Take 20 mg by mouth daily as needed (acid reflux).     ondansetron (ZOFRAN) 8 MG tablet Take 8 mg by mouth every 8 (eight) hours as needed for nausea or vomiting.     oxyCODONE 10 MG TABS Take 1 tablet (10 mg total) by mouth every 6 (six) hours as needed for moderate pain (not releieved by tylenol). 25 tablet 0   Current Facility-Administered Medications on File Prior to Visit  Medication Dose Route Frequency Provider Last Rate Last Admin   gadopentetate dimeglumine (MAGNEVIST) injection 18 mL  18 mL Intravenous Once PRN Melvenia Beam, MD        Allergies: No Known Allergies Past Medical History:  Past Medical History:  Diagnosis Date   Acute duodenal ulcer without mention of hemorrhage, perforation, or obstruction    Anemia    due to the polycythemia   COVID 2022   mild case   Dysrhythmia    History of palpations, for many years   GERD (gastroesophageal reflux disease)    Headache    takes Advil   Polycythemia vera (HCC)    Reflux esophagitis    Past Surgical History:  Past Surgical History:  Procedure Laterality Date   ABDOMINAL HYSTERECTOMY  2011ish   BIOPSY  07/11/2020   Procedure: BIOPSY;  Surgeon: Mauri Pole, MD;  Location: [facility];  Service: Endoscopy;;   CESAREAN SECTION     x 3  CHOLECYSTECTOMY     ESOPHAGOGASTRODUODENOSCOPY (EGD) WITH PROPOFOL N/A 07/11/2020   Procedure: ESOPHAGOGASTRODUODENOSCOPY (EGD) WITH PROPOFOL;  Surgeon: Mauri Pole, MD;  Location: [facility];  Service: Endoscopy;  Laterality: N/A;   IR BONE MARROW BIOPSY & ASPIRATION  08/12/2022   ORIF ANKLE FRACTURE Left 12/21/2014   Procedure: OPEN REDUCTION INTERNAL FIXATION (ORIF) LEFT LATERAL MALLEOLUS ANKLE FRACTURE;  Surgeon: Leandrew Koyanagi, MD;  Location: [facility];  Service: Orthopedics;  Laterality: Left;  Needs RNFA   SPLENECTOMY,  TOTAL N/A 04/02/2022   Procedure: OPEN SPLENECTOMY;  Surgeon: Erroll Luna, MD;  Location: [facility];  Service: General;  Laterality: N/A;  TAP BLOCK   UPPER GI ENDOSCOPY     Social History:  Social History   Socioeconomic History   Marital status: Married    Spouse name: Not on file   Number of children: 3   Years of education: Not on file   Highest education level: Not on file  Occupational History   Occupation: Psychologist, forensic ANALYST    Employer: [employer]  Tobacco Use   Smoking status: Never   Smokeless tobacco: Never  Vaping Use   Vaping Use: Never used  Substance and Sexual Activity   Alcohol use: No   Drug use: No   Sexual activity: Yes    Birth control/protection: Surgical  Other Topics Concern   Not on file  Social History Narrative   No caffeine    Right handed   Lives at home with her husband and daughter   Social Determinants of Health   Financial Resource Strain: Not on file  Food Insecurity: No Food Insecurity (02/28/2022)   Hunger Vital Sign    Worried About Running Out of Food in the Last Year: Never true    Ran Out of Food in the Last Year: Never true  Transportation Needs: No Transportation Needs (02/28/2022)   PRAPARE - Hydrologist (Medical): No    Lack of Transportation (Non-Medical): No  Physical Activity: Not on file  Stress: Not on file  Social Connections: Not on file  Intimate Partner Violence: Not At Risk (02/28/2022)   Humiliation, Afraid, Rape, and Kick questionnaire    Fear of Current or Ex-Partner: No    Emotionally Abused: No    Physically Abused: No    Sexually Abused: No   Family History:  Family History  Problem Relation Age of Onset   Migraines Mother    Breast cancer Other        Great [place]    Colon cancer Neg Hx     Review of Systems: Constitutional: Doesn't report fevers, chills or abnormal weight loss Eyes: Doesn't report blurriness of vision Ears, nose, mouth, throat, and face: Doesn't  report sore throat Respiratory: Doesn't report cough, dyspnea or wheezes Cardiovascular: Doesn't report palpitation, chest discomfort  Gastrointestinal:  Doesn't report nausea, constipation, diarrhea GU: Doesn't report incontinence Skin: Doesn't report skin rashes Neurological: Per HPI Musculoskeletal: Doesn't report joint pain Behavioral/Psych: Doesn't report anxiety  Physical Exam: Vitals:   08/15/22 1008  BP: 132/82  Pulse: (!) 103  Resp: 14  Temp: 97.7 F (36.5 C)  SpO2: 99%   KPS: 90. General: Alert, cooperative, pleasant, in no acute distress Head: Normal EENT: No conjunctival injection or scleral icterus.  Lungs: Resp effort normal Cardiac: Regular rate Abdomen: Non-distended abdomen Skin: No rashes cyanosis or petechiae. Extremities: No clubbing or edema  Neurologic Exam: Mental Status: Awake, alert, attentive to examiner. Oriented to self and  environment. Language is fluent with intact comprehension.  Cranial Nerves: Visual acuity is grossly normal. Visual fields are full. Extra-ocular movements intact. No ptosis. Face is symmetric Motor: Tone and bulk are normal. Power is full in both arms and legs. Reflexes are symmetric, no pathologic reflexes present.  Sensory: Intact to light touch Gait: Normal.   Labs: I have reviewed the data as listed    Component Value Date/Time   NA 135 08/09/2022 1202   NA 140 02/20/2016 1432   Eileen 3.6 08/09/2022 1202   Eileen 4.1 02/20/2016 1432   CL 105 08/09/2022 1202   CO2 23 08/09/2022 1202   CO2 25 02/20/2016 1432   GLUCOSE 86 08/09/2022 1202   GLUCOSE 154 (H) 02/20/2016 1432   BUN 12 08/09/2022 1202   BUN 11.1 02/20/2016 1432   CREATININE 0.76 08/09/2022 1202   CREATININE 1.0 02/20/2016 1432   CALCIUM 8.8 (L) 08/09/2022 1202   CALCIUM 9.8 02/20/2016 1432   PROT 8.3 (H) 08/09/2022 1202   PROT 8.0 02/20/2016 1432   ALBUMIN 3.7 08/09/2022 1202   ALBUMIN 4.1 02/20/2016 1432   AST 20 08/09/2022 1202   AST 13 02/20/2016 1432    ALT 16 08/09/2022 1202   ALT 13 02/20/2016 1432   ALKPHOS 122 08/09/2022 1202   ALKPHOS 115 02/20/2016 1432   BILITOT <0.1 (L) 08/09/2022 1202   BILITOT 0.71 02/20/2016 1432   GFRNONAA >60 08/09/2022 1202   GFRAA >60 12/06/2019 1239   Lab Results  Component Value Date   WBC 21.8 (H) 08/12/2022   NEUTROABS 12.3 (H) 08/12/2022   HGB 7.9 (L) 08/12/2022   HCT 28.4 (L) 08/12/2022   MCV 70.6 (L) 08/12/2022   PLT 1,192 (Colon) 08/12/2022    Imaging:  IR BONE MARROW BIOPSY & ASPIRATION  Result Date: 08/12/2022 INDICATION: Polycythemia vera EXAM: FLUOROSCOPIC GUIDED BONE MARROW BIOPSY AND ASPIRATION MEDICATIONS: 10 mL lidocaine 1%.  25 mg Benadryl IV. FLUOROSCOPY TIME:  Fluoroscopic dose; 1 mGy ANESTHESIA/SEDATION: Moderate (conscious) sedation was employed during this procedure as administered by the Interventional Radiology RN. A total of Versed 4 mg and Fentanyl 100 mcg was administered intravenously. Moderate Sedation Time: 10 minutes. The patient's level of consciousness and vital signs were monitored continuously by radiology nursing throughout the procedure under my direct supervision. COMPLICATIONS: None immediate. PROCEDURE: Informed consent was obtained from the patient following an explanation of the procedure, risks, benefits and alternatives. The patient understands, agrees and consents for the procedure. All questions were addressed. A time out was performed prior to the initiation of the procedure. The patient was positioned prone on the fluoroscopy table and the posterior aspect of the RIGHT iliac crest was marked fluoroscopically. The operative site was prepped and draped in the usual sterile fashion. Under sterile conditions and local anesthesia, an 11 gauge coaxial bone biopsy needle was advanced into the posterior aspect of the RIGHT iliac marrow space under intermittent fluoroscopic guidance. Multiple fluoroscopic images were saved procedural documentation purposes. Initially, a  bone marrow aspiration was performed. Next, a bone marrow biopsy was obtained with the 11 gauge outer bone marrow device. Samples were prepared with the cytotechnologist and deemed adequate. The needle was removed and superficial hemostasis was obtained with manual compression. A dressing was applied. The patient tolerated the procedure well without immediate post procedural complication. IMPRESSION: Successful fluoroscopic guided RIGHT iliac bone marrow aspiration and core biopsy. Michaelle Birks, MD [name] Electronically Signed   By: Francesco Runner.D.  On: 08/12/2022 10:42   MR Brain W Wo Contrast  Result Date: 08/10/2022 CLINICAL DATA:  Initial evaluation for headache. EXAM: MRI HEAD WITHOUT AND WITH CONTRAST MRV HEAD WITHOUT CONTRAST TECHNIQUE: Multiplanar, multiecho pulse sequences of the brain and surrounding structures were obtained without and with intravenous contrast. Angiographic images of the intracranial venous structures were obtained using MRV technique without intravenous contrast. CONTRAST:  81m GADAVIST GADOBUTROL 1 MMOL/ML IV SOLN COMPARISON:  Comparison made with prior study from 07/29/2021. FINDINGS: MRI HEAD FINDINGS Brain: Cerebral volume within normal limits for age. No focal parenchymal signal abnormality. No abnormal foci of restricted diffusion to suggest acute or subacute ischemia. Gray-white matter differentiation well maintained. No encephalomalacia to suggest chronic cortical infarction or other insult. No foci of susceptibility artifact indicative of acute or chronic intracranial blood products. No mass lesion, midline shift or mass effect. Ventricles normal in size and morphology without hydrocephalus. No extra-axial fluid collection. Pituitary gland and suprasellar region within normal limits. No abnormal enhancement. Vascular: Major intracranial vascular flow voids are well maintained. Skull and upper cervical spine:  Craniocervical junction within normal limits. Diffuse loss of normal bone marrow signal, presumably related to history of polycythemia vera. No focal marrow replacing lesion. No scalp soft tissue abnormality. Sinuses/Orbits: Globes and orbital soft tissues are within normal limits. Paranasal sinuses are largely clear. No significant mastoid effusion. Other: None. MRV HEAD FINDINGS Normal flow related signal and enhancement seen throughout the superior sagittal sinus to the torcula. Transverse and sigmoid sinuses are patent as are the jugular bulbs and visualized proximal internal jugular veins. Straight sinus, vein of Galen, and internal cerebral veins are patent. No appreciable abnormality about the cavernous sinus. No cortical vein thrombosis. IMPRESSION: 1. Normal brain MRI. No acute intracranial abnormality. 2. Normal intracranial MRV. No evidence for dural sinus thrombosis. 3. Diffusely decreased T1 signal intensity throughout the visualized bone marrow, presumably related to history of polycythemia vera. Electronically Signed   By: BJeannine BogaM.D.   On: 08/10/2022 04:55   MR MRV HEAD W WO CONTRAST  Result Date: 08/10/2022 CLINICAL DATA:  Initial evaluation for headache. EXAM: MRI HEAD WITHOUT AND WITH CONTRAST MRV HEAD WITHOUT CONTRAST TECHNIQUE: Multiplanar, multiecho pulse sequences of the brain and surrounding structures were obtained without and with intravenous contrast. Angiographic images of the intracranial venous structures were obtained using MRV technique without intravenous contrast. CONTRAST:  910mGADAVIST GADOBUTROL 1 MMOL/ML IV SOLN COMPARISON:  Comparison made with prior study from 07/29/2021. FINDINGS: MRI HEAD FINDINGS Brain: Cerebral volume within normal limits for age. No focal parenchymal signal abnormality. No abnormal foci of restricted diffusion to suggest acute or subacute ischemia. Gray-white matter differentiation well maintained. No encephalomalacia to suggest chronic  cortical infarction or other insult. No foci of susceptibility artifact indicative of acute or chronic intracranial blood products. No mass lesion, midline shift or mass effect. Ventricles normal in size and morphology without hydrocephalus. No extra-axial fluid collection. Pituitary gland and suprasellar region within normal limits. No abnormal enhancement. Vascular: Major intracranial vascular flow voids are well maintained. Skull and upper cervical spine: Craniocervical junction within normal limits. Diffuse loss of normal bone marrow signal, presumably related to history of polycythemia vera. No focal marrow replacing lesion. No scalp soft tissue abnormality. Sinuses/Orbits: Globes and orbital soft tissues are within normal limits. Paranasal sinuses are largely clear. No significant mastoid effusion. Other: None. MRV HEAD FINDINGS Normal flow related signal and enhancement seen throughout the superior sagittal sinus to the torcula. Transverse and sigmoid sinuses  are patent as are the jugular bulbs and visualized proximal internal jugular veins. Straight sinus, vein of Galen, and internal cerebral veins are patent. No appreciable abnormality about the cavernous sinus. No cortical vein thrombosis. IMPRESSION: 1. Normal brain MRI. No acute intracranial abnormality. 2. Normal intracranial MRV. No evidence for dural sinus thrombosis. 3. Diffusely decreased T1 signal intensity throughout the visualized bone marrow, presumably related to history of polycythemia vera. Electronically Signed   By: Jeannine Boga M.D.   On: 08/10/2022 04:55   CT Angio Chest PE W and/or Wo Contrast  Result Date: 07/24/2022 CLINICAL DATA:  Intense chest heaviness, on anticoagulation. EXAM: CT ANGIOGRAPHY CHEST WITH CONTRAST TECHNIQUE: Multidetector CT imaging of the chest was performed using the standard protocol during bolus administration of intravenous contrast. Multiplanar CT image reconstructions and MIPs were obtained to  evaluate the vascular anatomy. RADIATION DOSE REDUCTION: This exam was performed according to the departmental dose-optimization program which includes automated exposure control, adjustment of the mA and/or kV according to patient size and/or use of iterative reconstruction technique. CONTRAST:  33m OMNIPAQUE IOHEXOL 350 MG/ML SOLN COMPARISON:  02/27/2022. FINDINGS: Cardiovascular: Negative for pulmonary embolus. Heart is enlarged. No pericardial effusion. Mediastinum/Nodes: Small thoracic inlet lymph nodes are not enlarged by CT size criteria. Mediastinal lymph nodes measure up to 9 mm in the low right paratracheal station, unchanged. No hilar or axillary adenopathy. Esophagus is grossly unremarkable. Lungs/Pleura: Image quality is degraded by expiratory phase imaging and respiratory motion, creating added density in the lungs bilaterally. No pleural fluid. Airway is otherwise unremarkable. Upper Abdomen: Visualized portions of the liver, adrenal glands and kidneys are unremarkable. Splenectomy. Visualized portions of the pancreas, stomach and bowel are grossly unremarkable. No upper abdominal adenopathy. Musculoskeletal: Degenerative changes in the spine. Review of the MIP images confirms the above findings. IMPRESSION: Negative for pulmonary embolus. Electronically Signed   By: MLorin PicketM.D.   On: 07/24/2022 10:26   DG Chest 2 View  Result Date: 07/24/2022 CLINICAL DATA:  Provided history: Chest pain. Cough. Shortness of breath. Fatigue. EXAM: CHEST - 2 VIEW COMPARISON:  Prior chest radiographs 12/06/2019 and earlier. Chest CT 02/27/2022. FINDINGS: Heart size within normal limits. No appreciable airspace consolidation. No evidence of pleural effusion or pneumothorax. No acute osseous abnormality is identified. IMPRESSION: No evidence of active cardiopulmonary disease. Electronically Signed   By: KKellie SimmeringD.O.   On: 07/24/2022 09:52     Assessment/Plan Intractable chronic migraine without aura  with status migrainosus  Eileen [name] presents with clinical syndrome consistent with chronic daily headache, secondary to analgesia overuse.    MRI/MRV were reviewed, normal.  We discussed keeping a headache and analgesia calendar, with the intention of gradually weaning total weekly Tylenol.  In meantime, we will provide prednisone '50mg'$  daily x7 days to help with headache and accelerate weaning.  In addition, given severity of the problem, will also recommend headache prophylaxis with propanolol '20mg'$  BID.  This could be discontinued if weaning is successful.   We spent twenty additional minutes teaching regarding the natural history, biology, and historical experience in the treatment of neurologic complications of cancer.   We appreciate the opportunity to participate in the care of KRuvi [name].  We will give her a call in 1 month for further med titration and evaluation.  All questions were answered. The patient knows to call the clinic with any problems, questions or concerns. No barriers to learning were detected.  The total time spent in the encounter was  40 minutes and more than 50% was on counseling and review of test results   Ventura Sellers, MD Medical Director of Neuro-Oncology [facility] at [place] 08/15/22 10:15 AM

## 2022-08-20 ENCOUNTER — Encounter (HOSPITAL_COMMUNITY): Payer: Self-pay | Admitting: Hematology

## 2022-08-21 ENCOUNTER — Encounter: Payer: Self-pay | Admitting: Vascular Surgery

## 2022-08-21 ENCOUNTER — Ambulatory Visit (INDEPENDENT_AMBULATORY_CARE_PROVIDER_SITE_OTHER): Payer: No Typology Code available for payment source | Admitting: Vascular Surgery

## 2022-08-21 VITALS — BP 120/77 | HR 83 | Temp 97.8°F | Resp 20 | Ht 63.0 in | Wt 202.0 lb

## 2022-08-21 DIAGNOSIS — I7410 Embolism and thrombosis of unspecified parts of aorta: Secondary | ICD-10-CM | POA: Diagnosis not present

## 2022-08-21 NOTE — Progress Notes (Signed)
Patient ID: Eileen [name], female   DOB: 10-Jul-1970, 51 y.o.   MRN: [id number]  Reason for Consult: New Patient (Initial Visit)   Referred by Erroll Luna, MD  Subjective:     HPI:  Eileen [name] is a 52 y.o. 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 Brantley Stage.  She was on Eliquis already after the splenic artery thrombosis currently still taking Eliquis and is followed by hematology for polycythemia vera.  She is not having any new back or abdominal pain and is mostly recovered from her recent hospitalization.  She does have purple discoloration of her toes but this is nonpainful.  Past Medical History:  Diagnosis Date   Acute duodenal ulcer without mention of hemorrhage, perforation, or obstruction    Anemia    due to the polycythemia   COVID 2022   mild case   Dysrhythmia    History of palpations, for many years   GERD (gastroesophageal reflux disease)    Headache    takes Advil   Polycythemia vera (HCC)    Reflux esophagitis    Family History  Problem Relation Age of Onset   Migraines Mother    Breast cancer Other        Great [place]    Colon cancer Neg Hx    Past Surgical History:  Procedure Laterality Date   ABDOMINAL HYSTERECTOMY  2011ish   BIOPSY  07/11/2020   Procedure: BIOPSY;  Surgeon: Mauri Pole, MD;  Location: [facility];  Service: Endoscopy;;   CESAREAN SECTION     x 3    CHOLECYSTECTOMY     ESOPHAGOGASTRODUODENOSCOPY (EGD) WITH PROPOFOL N/A 07/11/2020   Procedure: ESOPHAGOGASTRODUODENOSCOPY (EGD) WITH PROPOFOL;  Surgeon: Mauri Pole, MD;  Location: [facility];  Service: Endoscopy;  Laterality: N/A;   IR BONE MARROW BIOPSY & ASPIRATION  08/12/2022   ORIF ANKLE FRACTURE Left 12/21/2014   Procedure: OPEN REDUCTION INTERNAL FIXATION (ORIF) LEFT LATERAL MALLEOLUS ANKLE FRACTURE;   Surgeon: Leandrew Koyanagi, MD;  Location: [place];  Service: Orthopedics;  Laterality: Left;  Needs RNFA   SPLENECTOMY, TOTAL N/A 04/02/2022   Procedure: OPEN SPLENECTOMY;  Surgeon: Erroll Luna, MD;  Location: [place];  Service: General;  Laterality: N/A;  TAP BLOCK   UPPER GI ENDOSCOPY      Short Social History:  Social History   Tobacco Use   Smoking status: Never   Smokeless tobacco: Never  Substance Use Topics   Alcohol use: No    No Known Allergies  Current Outpatient Medications  Medication Sig Dispense Refill   acetaminophen (TYLENOL) 500 MG tablet Take 1,000 mg by mouth every 6 (six) hours as needed for moderate pain or mild pain.     apixaban (ELIQUIS) 5 MG TABS tablet Take 1 tablet (5 mg total) by mouth 2 (two) times daily. 60 tablet 2   cyclobenzaprine (FLEXERIL) 10 MG tablet Take 10 mg by mouth 3 (three) times daily as needed.     hydroxyurea (HYDREA) 500 MG capsule Take 1 capsule (500 mg total) by mouth daily. May take with food to minimize GI side effects. Start after PRBC transfusion. 30 capsule 1   omeprazole (PRILOSEC OTC) 20 MG tablet Take 20 mg by mouth daily as needed (acid reflux).     ondansetron (ZOFRAN) 8 MG tablet Take 8 mg by mouth  every 8 (eight) hours as needed for nausea or vomiting.     oxyCODONE 10 MG TABS Take 1 tablet (10 mg total) by mouth every 6 (six) hours as needed for moderate pain (not releieved by tylenol). 25 tablet 0   predniSONE (DELTASONE) 50 MG tablet Take 1 tablet (50 mg total) by mouth daily with breakfast. 7 tablet 0   propranolol (INDERAL) 20 MG tablet Take 1 tablet (20 mg total) by mouth 2 (two) times daily. 60 tablet 2   No current facility-administered medications for this visit.   Facility-Administered Medications Ordered in Other Visits  Medication Dose Route Frequency Provider Last Rate Last Admin   gadopentetate dimeglumine (MAGNEVIST) injection 18 mL  18 mL Intravenous Once PRN Melvenia Beam, MD        Review of Systems   Constitutional:  Constitutional negative. HENT: HENT negative.  Eyes: Eyes negative.  Respiratory: Respiratory negative.  Cardiovascular: Cardiovascular negative.  GI: Gastrointestinal negative.  Skin:       Discolored toes Neurological: Neurological negative. Hematologic: Hematologic/lymphatic negative.  Psychiatric: Psychiatric negative.        Objective:  Objective   Vitals:   08/21/22 1451  BP: 120/77  Pulse: 83  Resp: 20  Temp: 97.8 F (36.6 C)  SpO2: 98%  Weight: 202 lb (91.6 kg)  Height: '5\' 3"'$  (1.6 m)   Body mass index is 35.78 kg/m.  Physical Exam HENT:     Head: Normocephalic.     Nose: Nose normal.  Eyes:     Pupils: Pupils are equal, round, and reactive to light.  Cardiovascular:     Pulses:          Dorsalis pedis pulses are 2+ on the right side and 2+ on the left side.  Pulmonary:     Effort: Pulmonary effort is normal.  Abdominal:     General: Abdomen is flat.  Musculoskeletal:        General: Normal range of motion.     Right lower leg: No edema.     Left lower leg: No edema.  Skin:    General: Skin is warm and dry.     Capillary Refill: Capillary refill takes less than 2 seconds.  Neurological:     General: No focal deficit present.     Mental Status: She is alert.  Psychiatric:        Mood and Affect: Mood normal.        Behavior: Behavior normal.        Thought Content: Thought content normal.        Judgment: Judgment normal.     Data: Previous CT reviewed from December 2023     Assessment/Plan:    52 year old female with polycythemia vera now status post splenectomy after splenic artery thrombosis now with significantly elevated platelets remains on Eliquis.  Follow-up CT scan in the past was found to have aortic thrombus.  She has not had a follow-up CT scan from that time but appears asymptomatic.  We will get a repeat CT and we discussed today the possible follow-up results currently would not require any intervention since  she is asymptomatic.  Her husband demonstrate good understanding all questions were answered.      Waynetta Sandy MD [facility]

## 2022-08-23 ENCOUNTER — Inpatient Hospital Stay: Payer: No Typology Code available for payment source

## 2022-08-23 VITALS — BP 124/76 | HR 88 | Resp 16

## 2022-08-23 DIAGNOSIS — D45 Polycythemia vera: Secondary | ICD-10-CM | POA: Diagnosis not present

## 2022-08-23 DIAGNOSIS — D509 Iron deficiency anemia, unspecified: Secondary | ICD-10-CM

## 2022-08-23 MED ORDER — SODIUM CHLORIDE 0.9 % IV SOLN
200.0000 mg | Freq: Once | INTRAVENOUS | Status: AC
  Administered 2022-08-23: 200 mg via INTRAVENOUS
  Filled 2022-08-23: qty 200

## 2022-08-23 MED ORDER — LORATADINE 10 MG PO TABS
10.0000 mg | ORAL_TABLET | Freq: Once | ORAL | Status: AC
  Administered 2022-08-23: 10 mg via ORAL
  Filled 2022-08-23: qty 1

## 2022-08-23 MED ORDER — SODIUM CHLORIDE 0.9 % IV SOLN: Freq: Once | INTRAVENOUS | Status: AC

## 2022-08-23 NOTE — Patient Instructions (Signed)

## 2022-08-27 ENCOUNTER — Inpatient Hospital Stay: Payer: No Typology Code available for payment source

## 2022-08-27 ENCOUNTER — Inpatient Hospital Stay (HOSPITAL_BASED_OUTPATIENT_CLINIC_OR_DEPARTMENT_OTHER): Payer: No Typology Code available for payment source | Admitting: Hematology

## 2022-08-27 VITALS — BP 115/71 | HR 92 | Temp 97.8°F | Resp 17 | Ht 63.0 in | Wt 201.1 lb

## 2022-08-27 DIAGNOSIS — D509 Iron deficiency anemia, unspecified: Secondary | ICD-10-CM

## 2022-08-27 DIAGNOSIS — D45 Polycythemia vera: Secondary | ICD-10-CM | POA: Diagnosis not present

## 2022-08-27 LAB — CMP (CANCER CENTER ONLY)
ALT: 13 U/L (ref 0–44)
AST: 11 U/L — ABNORMAL LOW (ref 15–41)
Albumin: 4.2 g/dL (ref 3.5–5.0)
Alkaline Phosphatase: 126 U/L (ref 38–126)
Anion gap: 5 (ref 5–15)
BUN: 13 mg/dL (ref 6–20)
CO2: 29 mmol/L (ref 22–32)
Calcium: 9.6 mg/dL (ref 8.9–10.3)
Chloride: 105 mmol/L (ref 98–111)
Creatinine: 0.78 mg/dL (ref 0.44–1.00)
GFR, Estimated: >60 mL/min (ref >60)
Glucose, Bld: 80 mg/dL (ref 70–99)
Potassium: 4.4 mmol/L (ref 3.5–5.1)
Sodium: 139 mmol/L (ref 135–145)
Total Bilirubin: 0.3 mg/dL (ref 0.3–1.2)
Total Protein: 7.3 g/dL (ref 6.5–8.1)

## 2022-08-27 LAB — RETIC PANEL
Immature Retic Fract: 51.2 % — ABNORMAL HIGH (ref 2.3–15.9)
RBC.: 4.36 MIL/uL (ref 3.87–5.11)
Retic Count, Absolute: 68 10*3/uL (ref 19.0–186.0)
Retic Ct Pct: 1.6 % (ref 0.4–3.1)
Reticulocyte Hemoglobin: 23.3 pg — ABNORMAL LOW (ref >27.9)

## 2022-08-27 LAB — CBC WITH DIFFERENTIAL (CANCER CENTER ONLY)
Abs Immature Granulocytes: 0.12 10*3/uL — ABNORMAL HIGH (ref 0.00–0.07)
Basophils Absolute: 0.6 10*3/uL — ABNORMAL HIGH (ref 0.0–0.1)
Basophils Relative: 3 %
Eosinophils Absolute: 1.9 10*3/uL — ABNORMAL HIGH (ref 0.0–0.5)
Eosinophils Relative: 10 %
HCT: 32.1 % — ABNORMAL LOW (ref 36.0–46.0)
Hemoglobin: 8.7 g/dL — ABNORMAL LOW (ref 12.0–15.0)
Immature Granulocytes: 1 %
Lymphocytes Relative: 14 %
Lymphs Abs: 2.7 10*3/uL (ref 0.7–4.0)
MCH: 19.5 pg — ABNORMAL LOW (ref 26.0–34.0)
MCHC: 27.1 g/dL — ABNORMAL LOW (ref 30.0–36.0)
MCV: 71.8 fL — ABNORMAL LOW (ref 80.0–100.0)
Monocytes Absolute: 1.4 10*3/uL — ABNORMAL HIGH (ref 0.1–1.0)
Monocytes Relative: 7 %
Neutro Abs: 12 10*3/uL — ABNORMAL HIGH (ref 1.7–7.7)
Neutrophils Relative %: 65 %
Platelet Count: 528 10*3/uL — ABNORMAL HIGH (ref 150–400)
RBC: 4.47 MIL/uL (ref 3.87–5.11)
RDW: 24.9 % — ABNORMAL HIGH (ref 11.5–15.5)
WBC Count: 18.7 10*3/uL — ABNORMAL HIGH (ref 4.0–10.5)
nRBC: 5.8 % — ABNORMAL HIGH (ref 0.0–0.2)

## 2022-08-27 LAB — LACTATE DEHYDROGENASE: LDH: 164 U/L (ref 98–192)

## 2022-08-27 NOTE — Progress Notes (Signed)
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 female who has been a previous patient of Dr. 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  She was last seen by Dr. [name] on 06/21/2022 and complained of frequent vascular headaches, but was doing otherwise well overall.  Today, she is accompanied by her brother.  She confirms that her platelets have been high recently. She reports that when she was first diagnosed she was only getting phlebotomies and had continued this for 7 years. However, at the beginning of last year she needed consideration for additional treatment with [facility] for increase in her platelet counts and significant splenomegaly..  She reports that her spleen had been enlarged and she had not been symptomatic upfront. 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 The pain and spleen size limited her consumption of food. Patent reports that she was anemic prior to her splenectomy, though she is unsure of the cause.   She was initially on hydroxyurea for a short time, about a few months max, and she believes it was not suppressing her platelets. She was switched to [employer] year 5 MG twice a day, which did improve her platelets. She has been on Eliquis since November, 2024. Since then, her platelets have been very elevated. She has not previously been on any blood thinners or Aspirin.  Patient reports that Jakafi did not shrink her spleen. She discontinued it as she was told it may have a tendency to cause clots. She was then restarted on the original dose once a day after her splenectomy.  Patient was previously given narcotics by her PCP for spleen issues, which cause her to fall asleep.   She never had a bone marrow biopsy and patient's last phlebotomy was May 2022. She reports that she previously received oral iron which Irritated her ulcer. She denies any bleeding concerns.  She complains of severe headaches that began 6 months ago. She does report a Fhx of migraines. Her PCP has not ordered any scans previously to further evaluate. Patient is not on any preventative medication for her migraines.   She presented to the ED on 07/24/22 and complained of chest pain, breathing issues, pain in left arm. She describes that she felt like she was having a heart attack. To control this pain, patient regularly takes an exceedingly high dose of Tylenol 400-500 MG 5-6 times a day. She has been taking this Tylenol dose at least since September, 2023. She last took 4 tablets of Tylenol this morning. Patient has not had any chest pain since her ED visit.   She denies any change in vision, speech, or hearing, and no new weakness in her hands or feet. No chemical exposure or leg swelling. Patient reports that she only drinks 1-2 glasses of water a day.  She does complain of back pain which began October/November 2023. She presented to the chiropractor which had improved symptoms. She has a sedentary work lifestyle. She does take muscle relaxers to improve her back pain. Patient also complains of sudden muscle spasms that cause excruciating body pain.  INTERVAL HISTORY:  Eileen [name] is a wonderful 52 y.o. female who is  here for continued  evaluation and management of JAK2 positive polycythemia vera. Patient was last seen by me on 08/09/2022.   Patient is accompanied by her husband during this visit. She reports she is doing well overall without any new medical concerns since our last visit. She reports that her headaches have improved after IV iron infusion. Since our last visit, she has  received 1 unit of iron and 1 unit of blood.   She denies headache during this visit. She complains of increased hair loss for the past month before starting hydroxyurea.   Patient notes she has been prescribed Propranolol 20 mg from her Neurologist for her migraines, which has been helping her headaches. She has never been on Propranolol in the past.   She has been taking her aspirin and hydroxyurea as prescribed. She has been tolerating her hydroxyurea without any concerns.   She denies fever, chills, night sweats, infection issues, abdominal pain, back pain, chest pain, or leg swelling.    MEDICAL HISTORY:  Past Medical History:  Diagnosis Date   Acute duodenal ulcer without mention of hemorrhage, perforation, or obstruction    Anemia    due to the polycythemia   COVID 2022   mild case   Dysrhythmia    History of palpations, for many years   GERD (gastroesophageal reflux disease)    Headache    takes Advil   Polycythemia vera ([place])    Reflux esophagitis     SURGICAL HISTORY: Past Surgical History:  Procedure Laterality Date   ABDOMINAL HYSTERECTOMY  2011ish   BIOPSY  07/11/2020   Procedure: BIOPSY;  Surgeon: Mauri Pole, MD;  Location: [facility];  Service: Endoscopy;;   CESAREAN SECTION     x 3    CHOLECYSTECTOMY     ESOPHAGOGASTRODUODENOSCOPY (EGD) WITH PROPOFOL N/A 07/11/2020   Procedure: ESOPHAGOGASTRODUODENOSCOPY (EGD) WITH PROPOFOL;  Surgeon: Mauri Pole, MD;  Location: [facility];  Service: Endoscopy;  Laterality: N/A;   IR BONE MARROW BIOPSY & ASPIRATION  08/12/2022   ORIF ANKLE FRACTURE Left 12/21/2014   Procedure: OPEN REDUCTION INTERNAL FIXATION (ORIF) LEFT LATERAL MALLEOLUS ANKLE FRACTURE;  Surgeon: Leandrew Koyanagi, MD;  Location: [facility];  Service: Orthopedics;  Laterality: Left;  Needs RNFA   SPLENECTOMY, TOTAL N/A 04/02/2022   Procedure: OPEN SPLENECTOMY;  Surgeon: Erroll Luna, MD;  Location: [facility];  Service: General;  Laterality: N/A;   TAP BLOCK   UPPER GI ENDOSCOPY      SOCIAL HISTORY: Social History   Socioeconomic History   Marital status: Married    Spouse name: Not on file   Number of children: 3   Years of education: Not on file   Highest education level: Not on file  Occupational History   Occupation: Psychologist, forensic ANALYST    Employer: [employer]  Tobacco Use   Smoking status: Never   Smokeless tobacco: Never  Vaping Use   Vaping Use: Never used  Substance and Sexual Activity   Alcohol use: No   Drug use: No   Sexual activity: Yes    Birth control/protection: Surgical  Other Topics Concern   Not on file  Social History Narrative   No caffeine    Right handed   Lives at home with her husband and daughter   Social Determinants of Health   Financial Resource Strain: Not on file  Food Insecurity: No Food Insecurity (02/28/2022)   Hunger Vital Sign    Worried About Running Out of Food in the Last  Year: Never true    [place] in the Last Year: Never true  Transportation Needs: No Transportation Needs (02/28/2022)   PRAPARE - Hydrologist (Medical): No    Lack of Transportation (Non-Medical): No  Physical Activity: Not on file  Stress: Not on file  Social Connections: Not on file  Intimate Partner Violence: Not At Risk (02/28/2022)   Humiliation, Afraid, Rape, and Kick questionnaire    Fear of Current or Ex-Partner: No    Emotionally Abused: No    Physically Abused: No    Sexually Abused: No    FAMILY HISTORY: Family History  Problem Relation Age of Onset   Migraines Mother    Breast cancer Other        Great MA    Colon cancer Neg Hx     ALLERGIES:  has No Known Allergies.  MEDICATIONS:  Current Outpatient Medications  Medication Sig Dispense Refill   acetaminophen (TYLENOL) 500 MG tablet Take 1,000 mg by mouth every 6 (six) hours as needed for moderate pain or mild pain.     apixaban (ELIQUIS) 5 MG TABS tablet Take 1 tablet (5 mg total) by  mouth 2 (two) times daily. 60 tablet 2   cyclobenzaprine (FLEXERIL) 10 MG tablet Take 10 mg by mouth 3 (three) times daily as needed.     hydroxyurea (HYDREA) 500 MG capsule Take 1 capsule (500 mg total) by mouth daily. May take with food to minimize GI side effects. Start after PRBC transfusion. 30 capsule 1   omeprazole (PRILOSEC OTC) 20 MG tablet Take 20 mg by mouth daily as needed (acid reflux).     ondansetron (ZOFRAN) 8 MG tablet Take 8 mg by mouth every 8 (eight) hours as needed for nausea or vomiting.     oxyCODONE 10 MG TABS Take 1 tablet (10 mg total) by mouth every 6 (six) hours as needed for moderate pain (not releieved by tylenol). 25 tablet 0   predniSONE (DELTASONE) 50 MG tablet Take 1 tablet (50 mg total) by mouth daily with breakfast. 7 tablet 0   propranolol (INDERAL) 20 MG tablet Take 1 tablet (20 mg total) by mouth 2 (two) times daily. 60 tablet 2   No current facility-administered medications for this visit.   Facility-Administered Medications Ordered in Other Visits  Medication Dose Route Frequency Provider Last Rate Last Admin   gadopentetate dimeglumine (MAGNEVIST) injection 18 mL  18 mL Intravenous Once PRN Melvenia Beam, MD        REVIEW OF SYSTEMS:    10 Point review of Systems was done is negative except as noted above.  PHYSICAL EXAMINATION: ECOG PERFORMANCE STATUS: 1 - Symptomatic but completely ambulatory .BP 115/71 (BP Location: Left Arm, Patient Position: Sitting)   Pulse 92   Temp 97.8 F (36.6 C) (Temporal)   Resp 17   Ht 5\' 3"  (1.6 m)   Wt 201 lb 1.6 oz (91.2 kg)   SpO2 100%   BMI 35.62 kg/m   GENERAL:alert, in no acute distress and comfortable SKIN: no acute rashes, no significant lesions EYES: conjunctiva are pink and non-injected, sclera anicteric OROPHARYNX: MMM, no exudates, no oropharyngeal erythema or ulceration NECK: supple, no JVD LYMPH:  no palpable lymphadenopathy in the cervical, axillary or inguinal regions LUNGS: clear to  auscultation b/l with normal respiratory effort HEART: regular rate & rhythm ABDOMEN:  normoactive bowel sounds , non tender, not distended. Extremity: no pedal edema PSYCH: alert & oriented x 3  with fluent speech NEURO: no focal motor/sensory deficits  LABORATORY DATA:  I have reviewed the data as listed .    Latest Ref Rng & Units 08/27/2022   11:31 AM 08/12/2022    8:09 AM 08/09/2022   10:31 AM  CBC  WBC 4.0 - 10.5 K/uL 18.7  21.8  21.2   Hemoglobin 12.0 - 15.0 g/dL 8.7  7.9  7.0   Hematocrit 36.0 - 46.0 % 32.1  28.4  25.3   Platelets 150 - 400 K/uL 528  1,192  1,489    .    Latest Ref Rng & Units 08/27/2022   11:31 AM 08/09/2022   12:02 PM 07/24/2022    9:21 AM  CMP  Glucose 70 - 99 mg/dL 80  86  99   BUN 6 - 20 mg/dL 13  12  16    Creatinine 0.44 - 1.00 mg/dL 0.78  0.76  0.73   Sodium 135 - 145 mmol/L 139  135  138   Potassium 3.5 - 5.1 mmol/L 4.4  3.6  4.4   Chloride 98 - 111 mmol/L 105  105  109   CO2 22 - 32 mmol/L 29  23  22    Calcium 8.9 - 10.3 mg/dL 9.6  8.8  9.2   Total Protein 6.5 - 8.1 g/dL 7.3  8.3    Total Bilirubin 0.3 - 1.2 mg/dL 0.3  <0.1    Alkaline Phos 38 - 126 U/L 126  122    AST 15 - 41 U/L 11  20    ALT 0 - 44 U/L 13  16     . Lab Results  Component Value Date   LDH 164 08/27/2022     RADIOGRAPHIC STUDIES: I have personally reviewed the radiological images as listed and agreed with the findings in the report. IR BONE MARROW BIOPSY & ASPIRATION  Result Date: 08/12/2022 INDICATION: Polycythemia vera EXAM: FLUOROSCOPIC GUIDED BONE MARROW BIOPSY AND ASPIRATION MEDICATIONS: 10 mL lidocaine 1%.  25 mg Benadryl IV. FLUOROSCOPY TIME:  Fluoroscopic dose; 1 mGy ANESTHESIA/SEDATION: Moderate (conscious) sedation was employed during this procedure as administered by the Interventional Radiology RN. A total of Versed 4 mg and Fentanyl 100 mcg was administered intravenously. Moderate Sedation Time: 10 minutes. The patient's level of consciousness and vital signs  were monitored continuously by radiology nursing throughout the procedure under my direct supervision. COMPLICATIONS: None immediate. PROCEDURE: Informed consent was obtained from the patient following an explanation of the procedure, risks, benefits and alternatives. The patient understands, agrees and consents for the procedure. All questions were addressed. A time out was performed prior to the initiation of the procedure. The patient was positioned prone on the fluoroscopy table and the posterior aspect of the RIGHT iliac crest was marked fluoroscopically. The operative site was prepped and draped in the usual sterile fashion. Under sterile conditions and local anesthesia, an 11 gauge coaxial bone biopsy needle was advanced into the posterior aspect of the RIGHT iliac marrow space under intermittent fluoroscopic guidance. Multiple fluoroscopic images were saved procedural documentation purposes. Initially, a bone marrow aspiration was performed. Next, a bone marrow biopsy was obtained with the 11 gauge outer bone marrow device. Samples were prepared with the cytotechnologist and deemed adequate. The needle was removed and superficial hemostasis was obtained with manual compression. A dressing was applied. The patient tolerated the procedure well without immediate post procedural complication. IMPRESSION: Successful fluoroscopic guided RIGHT iliac bone marrow aspiration and core biopsy. Michaelle Birks, MD [facility] Radiology Electronically Signed   By: Michaelle Birks M.D.   On: 08/12/2022 10:42   MR Brain W Wo Contrast  Result Date: 08/10/2022 CLINICAL DATA:  Initial evaluation for headache. EXAM: MRI HEAD WITHOUT AND WITH CONTRAST MRV HEAD WITHOUT CONTRAST TECHNIQUE: Multiplanar, multiecho pulse sequences of the brain and surrounding structures were obtained without and with intravenous contrast. Angiographic images of the intracranial venous structures were  obtained using MRV technique without intravenous contrast. CONTRAST:  77mL GADAVIST GADOBUTROL 1 MMOL/ML IV SOLN COMPARISON:  Comparison made with prior study from 07/29/2021. FINDINGS: MRI HEAD FINDINGS Brain: Cerebral volume within normal limits for age. No focal parenchymal signal abnormality. No abnormal foci of restricted diffusion to suggest acute or subacute ischemia. Gray-white matter differentiation well maintained. No encephalomalacia to suggest chronic cortical infarction or other insult. No foci of susceptibility artifact indicative of acute or chronic intracranial blood products. No mass lesion, midline shift or mass effect. Ventricles normal in size and morphology without hydrocephalus. No extra-axial fluid collection. Pituitary gland and suprasellar region within normal limits. No abnormal enhancement. Vascular: Major intracranial vascular flow voids are well maintained. Skull and upper cervical spine: Craniocervical junction within normal limits. Diffuse loss of normal bone marrow signal, presumably related to history of polycythemia vera. No focal marrow replacing lesion. No scalp soft tissue abnormality. Sinuses/Orbits: Globes and orbital soft tissues are within normal limits. Paranasal sinuses are largely clear. No significant mastoid effusion. Other: None. MRV HEAD FINDINGS Normal flow related signal and enhancement seen throughout the superior sagittal sinus to the torcula. Transverse and sigmoid sinuses are patent as are the jugular bulbs and visualized proximal internal jugular veins. Straight sinus, vein of Galen, and internal cerebral veins are patent. No appreciable abnormality about the cavernous sinus. No cortical vein thrombosis. IMPRESSION: 1. Normal brain MRI. No acute intracranial abnormality. 2. Normal intracranial MRV. No evidence for dural sinus thrombosis. 3. Diffusely decreased T1 signal intensity throughout the visualized bone marrow, presumably related to history of polycythemia  vera. Electronically Signed   By: Jeannine Boga M.D.   On: 08/10/2022 04:55   MR MRV HEAD W WO CONTRAST  Result Date: 08/10/2022 CLINICAL DATA:  Initial evaluation for headache. EXAM: MRI HEAD WITHOUT AND WITH CONTRAST MRV HEAD WITHOUT CONTRAST TECHNIQUE: Multiplanar, multiecho pulse sequences of the brain and surrounding structures were obtained without and with intravenous contrast. Angiographic images of the intracranial venous structures were obtained using MRV technique without intravenous contrast. CONTRAST:  3mL GADAVIST GADOBUTROL 1 MMOL/ML IV SOLN COMPARISON:  Comparison made with prior study from 07/29/2021. FINDINGS: MRI HEAD FINDINGS Brain: Cerebral volume within normal limits for age. No focal parenchymal signal abnormality. No abnormal foci of restricted diffusion to suggest acute or subacute ischemia. Gray-white matter differentiation well maintained. No encephalomalacia to suggest chronic cortical infarction or other insult. No foci of susceptibility artifact indicative of acute or chronic intracranial blood products. No mass lesion, midline shift or mass effect. Ventricles normal in size and morphology without hydrocephalus. No extra-axial fluid collection. Pituitary gland and suprasellar region within normal limits. No abnormal enhancement. Vascular: Major intracranial vascular flow voids are well maintained. Skull and upper cervical spine: Craniocervical junction within normal limits. Diffuse loss of normal bone marrow signal, presumably related to history of polycythemia vera. No focal marrow replacing lesion. No scalp soft tissue abnormality. Sinuses/Orbits: Globes and orbital soft tissues are within normal limits. Paranasal sinuses are largely clear. No significant mastoid effusion. Other: None. MRV HEAD FINDINGS Normal flow related signal  and enhancement seen throughout the superior sagittal sinus to the torcula. Transverse and sigmoid sinuses are patent as are the jugular bulbs and  visualized proximal internal jugular veins. Straight sinus, vein of Galen, and internal cerebral veins are patent. No appreciable abnormality about the cavernous sinus. No cortical vein thrombosis. IMPRESSION: 1. Normal brain MRI. No acute intracranial abnormality. 2. Normal intracranial MRV. No evidence for dural sinus thrombosis. 3. Diffusely decreased T1 signal intensity throughout the visualized bone marrow, presumably related to history of polycythemia vera. Electronically Signed   By: Jeannine Boga M.D.   On: 08/10/2022 04:55    ASSESSMENT & PLAN:   Wonderful 52 year old female with:   1.  Polycythemia vera diagnosed in 2017.  She was found to have JAK2 positive disease.  She had an excellent response to [facility] with improvement in her constitutional symptoms.  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   2.  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  Follow-up with vascular surgery was recommended.   4.  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  Her platelets were decreasing although her white cell count slightly up.  Restarting Jakafi might help with this issue.  Noelle.Marry. S/p Splenectomy PLAN: -Patient needs lab work during this visit.  -Discussed the MRI brain and MRV results from 08/09/2022 which does not show any abnormalities.  -Discussed the bone marrow results from 08/12/2022 which showed polycythemia vera and showed no iron in bone marrow and no significant evidence of myelofibrosis. -labs from today 3/19 reviewed  -recommended to stay hydrated by drinking at least 2 L water a day.  Labs today her hgb is improving with IV iron and is now upto 8.7 and platelets have significantly improved to to 528k with  improving wbc counts. -will continue Hydroxyurea at 500mg  po daily F/u for IV iron as scheduled Appreciate neurology input. RTC with Dr Irene Limbo with labs in 3 weeks  FOLLOW-UP: Labs today F/u for IV iron as scheduled RTC with Dr Irene Limbo with labs in 3 weeks  The total time spent in the appointment was 40 minutes* .  All of the patient's questions were answered with apparent satisfaction. The patient knows to call the clinic with any problems, questions or concerns.   Sullivan Lone MD MS AAHIVMS [facility] Hematology/Oncology Physician [facility]  .*Total Encounter Time as defined by the Centers for Medicare and Medicaid Services includes, in addition to the face-to-face time of a patient visit (documented in the note above) non-face-to-face time: obtaining and reviewing outside history, ordering and reviewing medications, tests or procedures, care coordination (communications with other health care professionals or caregivers) and documentation in the medical record.   I, Cleda Mccreedy, am acting as a Education administrator for Sullivan Lone, MD. .I have reviewed the above documentation for accuracy and completeness, and I agree with the above. Brunetta Genera MD

## 2022-08-28 ENCOUNTER — Telehealth: Payer: Self-pay | Admitting: Hematology

## 2022-08-28 NOTE — Telephone Encounter (Signed)
Called patient per 3/19 los notes to schedule f/u. Patient schedule and notified.

## 2022-08-29 ENCOUNTER — Other Ambulatory Visit: Payer: Self-pay

## 2022-08-29 DIAGNOSIS — I7410 Embolism and thrombosis of unspecified parts of aorta: Secondary | ICD-10-CM

## 2022-08-30 ENCOUNTER — Inpatient Hospital Stay: Payer: No Typology Code available for payment source

## 2022-08-30 VITALS — BP 118/67 | HR 88 | Temp 98.2°F | Resp 16

## 2022-08-30 DIAGNOSIS — D509 Iron deficiency anemia, unspecified: Secondary | ICD-10-CM

## 2022-08-30 DIAGNOSIS — D45 Polycythemia vera: Secondary | ICD-10-CM | POA: Diagnosis not present

## 2022-08-30 MED ORDER — LORATADINE 10 MG PO TABS
10.0000 mg | ORAL_TABLET | Freq: Once | ORAL | Status: AC
  Administered 2022-08-30: 10 mg via ORAL
  Filled 2022-08-30: qty 1

## 2022-08-30 MED ORDER — SODIUM CHLORIDE 0.9 % IV SOLN
200.0000 mg | Freq: Once | INTRAVENOUS | Status: AC
  Administered 2022-08-30: 200 mg via INTRAVENOUS
  Filled 2022-08-30: qty 200

## 2022-08-30 MED ORDER — SODIUM CHLORIDE 0.9 % IV SOLN: Freq: Once | INTRAVENOUS | Status: AC

## 2022-08-30 NOTE — Patient Instructions (Signed)

## 2022-08-30 NOTE — Progress Notes (Signed)
Patient observed for 5 minutes and then she declined to stay for the entire 30 minutes havinf tolerated her iron well last dose. VSS_ BP 118/67 (BP Location: Left Arm, Patient Position: Sitting)   Pulse 88   Temp 98.2 F (36.8 C) (Oral)   Resp 16   SpO2 100%   Ambulatory to the lobby with SO.

## 2022-09-02 ENCOUNTER — Encounter: Payer: Self-pay | Admitting: Hematology

## 2022-09-04 ENCOUNTER — Other Ambulatory Visit: Payer: Self-pay

## 2022-09-04 DIAGNOSIS — D45 Polycythemia vera: Secondary | ICD-10-CM

## 2022-09-04 MED ORDER — APIXABAN 5 MG PO TABS: 5.0000 mg | ORAL_TABLET | Freq: Two times a day (BID) | ORAL | 2 refills | Status: DC

## 2022-09-06 ENCOUNTER — Inpatient Hospital Stay: Payer: No Typology Code available for payment source

## 2022-09-06 VITALS — BP 107/68 | HR 85 | Temp 98.4°F | Resp 17

## 2022-09-06 DIAGNOSIS — D509 Iron deficiency anemia, unspecified: Secondary | ICD-10-CM

## 2022-09-06 DIAGNOSIS — D45 Polycythemia vera: Secondary | ICD-10-CM | POA: Diagnosis not present

## 2022-09-06 MED ORDER — SODIUM CHLORIDE 0.9 % IV SOLN
200.0000 mg | Freq: Once | INTRAVENOUS | Status: AC
  Administered 2022-09-06: 200 mg via INTRAVENOUS
  Filled 2022-09-06: qty 200

## 2022-09-06 MED ORDER — LORATADINE 10 MG PO TABS
10.0000 mg | ORAL_TABLET | Freq: Once | ORAL | Status: AC
  Administered 2022-09-06: 10 mg via ORAL
  Filled 2022-09-06: qty 1

## 2022-09-06 MED ORDER — SODIUM CHLORIDE 0.9 % IV SOLN: Freq: Once | INTRAVENOUS | Status: AC

## 2022-09-06 NOTE — Progress Notes (Signed)
Patient declined to remain for 30 minute post obs period.

## 2022-09-09 ENCOUNTER — Telehealth: Payer: Self-pay

## 2022-09-09 NOTE — Telephone Encounter (Signed)
Contacted pt per Dr Irene Limbo to let pt know: her platelet count has significantly improved down to 500k and so we shall continue her current dose of hydroxyurea and not increase it currently.  Pt acknowledged information and verbalized understanding.

## 2022-09-12 ENCOUNTER — Telehealth: Payer: No Typology Code available for payment source | Admitting: Internal Medicine

## 2022-09-12 ENCOUNTER — Inpatient Hospital Stay: Payer: No Typology Code available for payment source | Attending: Oncology | Admitting: Internal Medicine

## 2022-09-12 DIAGNOSIS — D45 Polycythemia vera: Secondary | ICD-10-CM | POA: Insufficient documentation

## 2022-09-12 DIAGNOSIS — Z79899 Other long term (current) drug therapy: Secondary | ICD-10-CM | POA: Insufficient documentation

## 2022-09-12 DIAGNOSIS — Z803 Family history of malignant neoplasm of breast: Secondary | ICD-10-CM | POA: Insufficient documentation

## 2022-09-12 DIAGNOSIS — Z9081 Acquired absence of spleen: Secondary | ICD-10-CM | POA: Insufficient documentation

## 2022-09-12 DIAGNOSIS — Z981 Arthrodesis status: Secondary | ICD-10-CM | POA: Insufficient documentation

## 2022-09-12 DIAGNOSIS — G4459 Other complicated headache syndrome: Secondary | ICD-10-CM | POA: Diagnosis not present

## 2022-09-12 DIAGNOSIS — G43711 Chronic migraine without aura, intractable, with status migrainosus: Secondary | ICD-10-CM

## 2022-09-12 DIAGNOSIS — I75023 Atheroembolism of bilateral lower extremities: Secondary | ICD-10-CM | POA: Insufficient documentation

## 2022-09-12 DIAGNOSIS — D72829 Elevated white blood cell count, unspecified: Secondary | ICD-10-CM | POA: Insufficient documentation

## 2022-09-12 DIAGNOSIS — D75839 Thrombocytosis, unspecified: Secondary | ICD-10-CM | POA: Insufficient documentation

## 2022-09-12 DIAGNOSIS — Z86718 Personal history of other venous thrombosis and embolism: Secondary | ICD-10-CM | POA: Insufficient documentation

## 2022-09-12 DIAGNOSIS — Z7901 Long term (current) use of anticoagulants: Secondary | ICD-10-CM | POA: Insufficient documentation

## 2022-09-12 NOTE — Progress Notes (Signed)
I connected with Eileen [name] on 09/12/22 at 10:30 AM EDT by telephone visit and verified that I am speaking with the correct person using two identifiers.  I discussed the limitations, risks, security and privacy concerns of performing an evaluation and management service by telemedicine and the availability of in-person appointments. I also discussed with the patient that there may be a patient responsible charge related to this service. The patient expressed understanding and agreed to proceed.  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  She also has been dosing the propanolol without complication.  Now cutting the dose in half at night to 10mg .  No other new or progressive symptoms.  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  Recommended continuing propanolol 10mg  HS x7 days, then stopping if still headache free.  Follow Up Instructions: RTC only as needed   I discussed the assessment and treatment plan with the patient.  The patient was provided an opportunity to ask questions and all were answered.  The patient agreed with the plan and demonstrated understanding of the instructions.    The patient was advised to call back or seek an in-person evaluation if the symptoms worsen or if the condition fails to improve as anticipated.    Ventura Sellers, MD   I provided 22 minutes of non face-to-face telephone visit time during this encounter, and > 50% was spent counseling as documented under my assessment  & plan.

## 2022-09-20 ENCOUNTER — Ambulatory Visit (HOSPITAL_COMMUNITY)
Admission: RE | Admit: 2022-09-20 | Discharge: 2022-09-20 | Disposition: A | Discharge status: Home / Self Care | Payer: No Typology Code available for payment source | Source: Ambulatory Visit | Attending: Vascular Surgery

## 2022-09-20 DIAGNOSIS — I7410 Embolism and thrombosis of unspecified parts of aorta: Secondary | ICD-10-CM

## 2022-09-20 MED ORDER — IOHEXOL 350 MG/ML SOLN
75.0000 mL | Freq: Once | INTRAVENOUS | Status: AC | PRN
  Administered 2022-09-20: 75 mL via INTRAVENOUS

## 2022-09-25 ENCOUNTER — Encounter: Payer: Self-pay | Admitting: Vascular Surgery

## 2022-09-25 ENCOUNTER — Ambulatory Visit (INDEPENDENT_AMBULATORY_CARE_PROVIDER_SITE_OTHER): Payer: No Typology Code available for payment source | Admitting: Vascular Surgery

## 2022-09-25 VITALS — BP 149/88 | HR 98 | Temp 97.9°F | Resp 20 | Ht 63.0 in | Wt 207.8 lb

## 2022-09-25 DIAGNOSIS — I75023 Atheroembolism of bilateral lower extremities: Secondary | ICD-10-CM | POA: Diagnosis not present

## 2022-09-25 DIAGNOSIS — I7410 Embolism and thrombosis of unspecified parts of aorta: Secondary | ICD-10-CM | POA: Diagnosis not present

## 2022-09-25 NOTE — Progress Notes (Signed)
Patient ID: Eileen [name], female   DOB: 08-Sep-1970, 52 y.o.   MRN: [id number]  Reason for Consult: Follow-up   Referred by Dois Davenport, MD  Subjective:     HPI:  Eileen [name] is a 52 y.o. 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  At last visit she was evaluated for aortic thrombus she now follows up with repeat CT scan.  She does have persistent discoloration of her toes bilaterally that is really painful in the great toes.  She also has associated swelling in the lower extremities which causes having some pain throughout the bilateral legs throughout the day but does not have any tissue loss or ulceration.  Past Medical History:  Diagnosis Date   Acute duodenal ulcer without mention of hemorrhage, perforation, or obstruction    Anemia    due to the polycythemia   COVID 2022   mild case   Dysrhythmia    History of palpations, for many years   GERD (gastroesophageal reflux disease)    Headache    takes Advil   Polycythemia vera    Reflux esophagitis    Family History  Problem Relation Age of Onset   Migraines Mother    Breast cancer Other        Great MA    Colon cancer Neg Hx    Past Surgical History:  Procedure Laterality Date   ABDOMINAL HYSTERECTOMY  2011ish   BIOPSY  07/11/2020   Procedure: BIOPSY;  Surgeon: Napoleon Form, MD;  Location: [facility];  Service: Endoscopy;;   CESAREAN SECTION     x 3    CHOLECYSTECTOMY     ESOPHAGOGASTRODUODENOSCOPY (EGD) WITH PROPOFOL N/A 07/11/2020   Procedure: ESOPHAGOGASTRODUODENOSCOPY (EGD) WITH PROPOFOL;  Surgeon: Napoleon Form, MD;  Location: [facility];  Service: Endoscopy;  Laterality: N/A;   IR BONE MARROW BIOPSY & ASPIRATION  08/12/2022   ORIF ANKLE FRACTURE Left 12/21/2014   Procedure: OPEN REDUCTION INTERNAL FIXATION (ORIF) LEFT LATERAL MALLEOLUS ANKLE FRACTURE;  Surgeon: Tarry Kos, MD;  Location: [facility];  Service:  Orthopedics;  Laterality: Left;  Needs RNFA   SPLENECTOMY, TOTAL N/A 04/02/2022   Procedure: OPEN SPLENECTOMY;  Surgeon: Harriette Bouillon, MD;  Location: [facility];  Service: General;  Laterality: N/A;  TAP BLOCK   UPPER GI ENDOSCOPY      Short Social History:  Social History   Tobacco Use   Smoking status: Never   Smokeless tobacco: Never  Substance Use Topics   Alcohol use: No    No Known Allergies  Current Outpatient Medications  Medication Sig Dispense Refill   acetaminophen (TYLENOL) 500 MG tablet Take 1,000 mg by mouth every 6 (six) hours as needed for moderate pain or mild pain.     apixaban (ELIQUIS) 5 MG TABS tablet Take 1 tablet (5 mg total) by mouth 2 (two) times daily. 60 tablet 2   cyclobenzaprine (FLEXERIL) 10 MG tablet Take 10 mg by mouth 3 (three) times daily as needed.     hydroxyurea (HYDREA) 500 MG capsule Take 1 capsule (500 mg total) by mouth daily. May take with food to minimize GI side effects. Start after PRBC transfusion. 30 capsule 1   omeprazole (PRILOSEC OTC) 20 MG tablet Take 20 mg by mouth daily as needed (acid reflux).     ondansetron (ZOFRAN) 8 MG tablet Take 8 mg by mouth every 8 (eight) hours as needed for nausea or vomiting.  oxyCODONE 10 MG TABS Take 1 tablet (10 mg total) by mouth every 6 (six) hours as needed for moderate pain (not releieved by tylenol). 25 tablet 0   predniSONE (DELTASONE) 50 MG tablet Take 1 tablet (50 mg total) by mouth daily with breakfast. 7 tablet 0   propranolol (INDERAL) 20 MG tablet Take 1 tablet (20 mg total) by mouth 2 (two) times daily. 60 tablet 2   No current facility-administered medications for this visit.   Facility-Administered Medications Ordered in Other Visits  Medication Dose Route Frequency Provider Last Rate Last Admin   gadopentetate dimeglumine (MAGNEVIST) injection 18 mL  18 mL Intravenous Once PRN Anson Fret, MD        Review of Systems  Constitutional:  Constitutional negative. HENT: HENT  negative.  Eyes: Eyes negative.  Respiratory: Respiratory negative.  Cardiovascular: Positive for leg swelling.  GI: Gastrointestinal negative.  Musculoskeletal: Positive for leg pain.  Skin:       Purple toes Neurological: Neurological negative. Hematologic: Hematologic/lymphatic negative.  Psychiatric: Psychiatric negative.        Objective:  Objective   Vitals:   09/25/22 1444  BP: (!) 149/88  Pulse: 98  Resp: 20  Temp: 97.9 F (36.6 C)  SpO2: 99%  Weight: 207 lb 12.8 oz (94.3 kg)  Height:  (1.6 m)   Body mass index is 36.81 kg/m.  Physical Exam HENT:     Head: Normocephalic.     Nose: Nose normal.  Eyes:     Pupils: Pupils are equal, round, and reactive to light.  Cardiovascular:     Rate and Rhythm: Normal rate.     Pulses:          Dorsalis pedis pulses are detected w/ Doppler on the right side and detected w/ Doppler on the left side.       Posterior tibial pulses are detected w/ Doppler on the right side and detected w/ Doppler on the left side.  Pulmonary:     Effort: Pulmonary effort is normal.  Abdominal:     General: Abdomen is flat.     Palpations: Abdomen is soft.  Musculoskeletal:     Right lower leg: Edema present.     Left lower leg: Edema present.  Skin:    General: Skin is warm.     Capillary Refill: Capillary refill takes less than 2 seconds.     Comments: Purple toes bilaterally  Neurological:     General: No focal deficit present.     Mental Status: She is alert.  Psychiatric:        Mood and Affect: Mood normal.        Thought Content: Thought content normal.        Judgment: Judgment normal.    Data: CTA IMPRESSION: 1. 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 This likely represented thrombus adherent to the underlying fibrofatty plaque which has now resolved. 2. Persistent irregular fibrofatty atherosclerotic plaque in the infrarenal  abdominal aorta with a focus of penetrating atherosclerotic ulceration measuring up to 2.1 cm. Although not aneurysmal by measurement, the saccular nature of this focal ulceration warrants additional follow-up. Recommend repeat CT arteriogram of the abdomen and pelvis in 6-12 months. 3. Geographic hepatic steatosis. 4. Surgical changes of prior splenectomy. 5. 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 6. Trace atherosclerotic calcifications along the coronary arteries.     Assessment/Plan:    52 year old female  with polycythemia vera on Eliquis with resolved intra aortic thrombus with some persistent fibrofatty atherosclerotic plaque and infrarenal ulceration measuring up to 2.1 cm.  From this standpoint we will follow her with duplex in 1 year of the aorta as long as she has no issues prior to that.  She does have purple toes bilaterally likely from embolic disease with palpable pulses on the right and though I cannot feel pulses on the left she has very strong signals to suggest no intervention is necessary.  Would benefit from aspirin in addition to anticoagulation.     Maeola Harman MD [facility]

## 2022-09-27 ENCOUNTER — Other Ambulatory Visit: Payer: Self-pay

## 2022-09-27 DIAGNOSIS — D509 Iron deficiency anemia, unspecified: Secondary | ICD-10-CM

## 2022-09-27 DIAGNOSIS — D45 Polycythemia vera: Secondary | ICD-10-CM

## 2022-09-30 ENCOUNTER — Inpatient Hospital Stay: Payer: No Typology Code available for payment source

## 2022-09-30 ENCOUNTER — Other Ambulatory Visit: Payer: Self-pay

## 2022-09-30 ENCOUNTER — Telehealth: Payer: Self-pay | Admitting: *Deleted

## 2022-09-30 ENCOUNTER — Inpatient Hospital Stay (HOSPITAL_BASED_OUTPATIENT_CLINIC_OR_DEPARTMENT_OTHER): Payer: No Typology Code available for payment source | Admitting: Hematology

## 2022-09-30 VITALS — BP 128/70 | HR 93 | Temp 97.9°F | Resp 20 | Wt 208.7 lb

## 2022-09-30 DIAGNOSIS — D72829 Elevated white blood cell count, unspecified: Secondary | ICD-10-CM | POA: Diagnosis not present

## 2022-09-30 DIAGNOSIS — I75023 Atheroembolism of bilateral lower extremities: Secondary | ICD-10-CM | POA: Diagnosis not present

## 2022-09-30 DIAGNOSIS — Z79899 Other long term (current) drug therapy: Secondary | ICD-10-CM | POA: Diagnosis not present

## 2022-09-30 DIAGNOSIS — Z981 Arthrodesis status: Secondary | ICD-10-CM | POA: Diagnosis not present

## 2022-09-30 DIAGNOSIS — Z803 Family history of malignant neoplasm of breast: Secondary | ICD-10-CM | POA: Diagnosis not present

## 2022-09-30 DIAGNOSIS — D45 Polycythemia vera: Secondary | ICD-10-CM

## 2022-09-30 DIAGNOSIS — Z9081 Acquired absence of spleen: Secondary | ICD-10-CM | POA: Diagnosis not present

## 2022-09-30 DIAGNOSIS — D509 Iron deficiency anemia, unspecified: Secondary | ICD-10-CM

## 2022-09-30 DIAGNOSIS — Z7901 Long term (current) use of anticoagulants: Secondary | ICD-10-CM | POA: Diagnosis not present

## 2022-09-30 DIAGNOSIS — Z86718 Personal history of other venous thrombosis and embolism: Secondary | ICD-10-CM | POA: Diagnosis not present

## 2022-09-30 DIAGNOSIS — D75839 Thrombocytosis, unspecified: Secondary | ICD-10-CM | POA: Diagnosis not present

## 2022-09-30 LAB — CMP (CANCER CENTER ONLY)
ALT: 10 U/L (ref 0–44)
AST: 14 U/L — ABNORMAL LOW (ref 15–41)
Albumin: 4.1 g/dL (ref 3.5–5.0)
Alkaline Phosphatase: 120 U/L (ref 38–126)
Anion gap: 10 (ref 5–15)
BUN: 15 mg/dL (ref 6–20)
CO2: 23 mmol/L (ref 22–32)
Calcium: 9.9 mg/dL (ref 8.9–10.3)
Chloride: 106 mmol/L (ref 98–111)
Creatinine: 0.9 mg/dL (ref 0.44–1.00)
GFR, Estimated: >60 mL/min (ref >60)
Glucose, Bld: 95 mg/dL (ref 70–99)
Potassium: 4 mmol/L (ref 3.5–5.1)
Sodium: 139 mmol/L (ref 135–145)
Total Bilirubin: 0.3 mg/dL (ref 0.3–1.2)
Total Protein: 7.7 g/dL (ref 6.5–8.1)

## 2022-09-30 LAB — RETIC PANEL
Immature Retic Fract: 27.9 % — ABNORMAL HIGH (ref 2.3–15.9)
RBC.: 4.75 MIL/uL (ref 3.87–5.11)
Retic Count, Absolute: 38 10*3/uL (ref 19.0–186.0)
Retic Ct Pct: 0.8 % (ref 0.4–3.1)
Reticulocyte Hemoglobin: 17.4 pg — ABNORMAL LOW (ref >27.9)

## 2022-09-30 LAB — CBC WITH DIFFERENTIAL (CANCER CENTER ONLY)
Abs Immature Granulocytes: 0.13 10*3/uL — ABNORMAL HIGH (ref 0.00–0.07)
Basophils Absolute: 0.9 10*3/uL — ABNORMAL HIGH (ref 0.0–0.1)
Basophils Relative: 5 %
Eosinophils Absolute: 1.8 10*3/uL — ABNORMAL HIGH (ref 0.0–0.5)
Eosinophils Relative: 10 %
HCT: 34.2 % — ABNORMAL LOW (ref 36.0–46.0)
Hemoglobin: 9.9 g/dL — ABNORMAL LOW (ref 12.0–15.0)
Immature Granulocytes: 1 %
Lymphocytes Relative: 16 %
Lymphs Abs: 2.8 10*3/uL (ref 0.7–4.0)
MCH: 21.3 pg — ABNORMAL LOW (ref 26.0–34.0)
MCHC: 28.9 g/dL — ABNORMAL LOW (ref 30.0–36.0)
MCV: 73.5 fL — ABNORMAL LOW (ref 80.0–100.0)
Monocytes Absolute: 1.1 10*3/uL — ABNORMAL HIGH (ref 0.1–1.0)
Monocytes Relative: 6 %
Neutro Abs: 11.5 10*3/uL — ABNORMAL HIGH (ref 1.7–7.7)
Neutrophils Relative %: 62 %
Platelet Count: 870 10*3/uL — ABNORMAL HIGH (ref 150–400)
RBC: 4.65 MIL/uL (ref 3.87–5.11)
RDW: 25.6 % — ABNORMAL HIGH (ref 11.5–15.5)
WBC Count: 18.2 10*3/uL — ABNORMAL HIGH (ref 4.0–10.5)
nRBC: 0.8 % — ABNORMAL HIGH (ref 0.0–0.2)

## 2022-09-30 LAB — LACTATE DEHYDROGENASE: LDH: 194 U/L — ABNORMAL HIGH (ref 98–192)

## 2022-09-30 MED ORDER — HYDROXYUREA 500 MG PO CAPS: ORAL_CAPSULE | ORAL | 2 refills | Status: DC

## 2022-09-30 MED ORDER — ASPIRIN 81 MG PO TBEC: 81.0000 mg | DELAYED_RELEASE_TABLET | Freq: Every day | ORAL | 12 refills | Status: AC

## 2022-09-30 MED ORDER — PROPRANOLOL HCL 20 MG PO TABS: 20.0000 mg | ORAL_TABLET | Freq: Two times a day (BID) | ORAL | 5 refills | Status: DC

## 2022-09-30 MED ORDER — HYDROXYUREA 500 MG PO CAPS: 500.0000 mg | ORAL_CAPSULE | Freq: Every day | ORAL | 1 refills | Status: DC

## 2022-09-30 NOTE — Progress Notes (Signed)
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 female who has been a previous patient of Dr. 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  She was last seen by Dr. Clelia Croft on 06/21/2022 and complained of frequent vascular headaches, but was doing otherwise well overall.  Today, she is accompanied by her brother.  She confirms that her platelets have been high recently. She reports that when she was first diagnosed she was only getting phlebotomies and had continued this for 7 years. However, at the beginning of last year she needed consideration for additional treatment with [facility] for increase in her platelet counts and significant splenomegaly..  She reports that her spleen had been enlarged and she had not been symptomatic upfront. 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 The pain and spleen size limited her consumption of food. Patent reports that she was anemic prior to her splenectomy, though she is unsure of the cause.   She was initially on hydroxyurea for a short time, about a few months max, and she believes it was not suppressing her platelets. She was switched to [employer] year 5 MG twice a day, which did improve her platelets. She has been on Eliquis since November, 2024. Since then, her platelets have been very elevated. She has not previously been on any blood thinners or Aspirin.  Patient reports that Jakafi did not shrink her spleen. She discontinued it as she was told it may have a tendency to cause clots. She was then restarted on the original dose once a day after her splenectomy.  Patient was previously given narcotics by her PCP for spleen issues, which cause her to fall asleep.   She never had a bone marrow biopsy and patient's last phlebotomy was May 2022. She reports that she previously received oral iron which Irritated her ulcer. She denies any bleeding concerns.  She complains of severe headaches that began 6 months ago. She does report a Fhx of migraines. Her PCP has not ordered any scans previously to further evaluate. Patient is not on any preventative medication for her migraines.   She presented to the ED on 07/24/22 and complained of chest pain, breathing issues, pain in left arm. She describes that she felt like she was having a heart attack. To control this pain, patient regularly takes an exceedingly high dose of Tylenol 400-500 MG 5-6 times a day. She has been taking this Tylenol dose at least since September, 2023. She last took 4 tablets of Tylenol this morning. Patient has not had any chest pain since her ED visit.   She denies any change in vision, speech, or hearing, and no new weakness in her hands or feet. No chemical exposure or leg swelling. Patient reports that she only drinks 1-2 glasses of water a day.  She does complain of back pain which began October/November 2023. She presented to the chiropractor which had improved symptoms. She has a sedentary work lifestyle. She does take muscle relaxers to improve her back pain. Patient also complains of sudden muscle spasms that cause excruciating body pain.  INTERVAL HISTORY:  Eileen [name] is a wonderful 52 y.o. female who is  here for continued  evaluation and management of JAK2 positive polycythemia vera.   Patient was last seen by me on 08/27/2022 and she complained of increased hair loss, other than that she was doing well overall.   Patient is accompanied by her husband during this visit. She notes she has been doing well overall since our last visit. Patient complains of purple/blue pigmentation  on bilateral feet, bilateral leg swelling, and bilateral feet pain. She notes purple/blue pigmentation does not change with temperature. She notes that her pain gets better when she starts moving around.   She notes that Dr. Barbaraann Cao suggested to discontinue Propanol during her last visit with him. However, patient reports that her headaches came back when she stopped taking Propanol. She has started taking Propanol again. Patient is going to let Dr. Barbaraann Cao know about this. She denies headache during this visit.   She denies fever, chills, infections issues, night sweats, unexpected weight loss, new lumps/bumps, abnormal bowel movement, abdominal pain, back pain, chest pain, abnormal bleeding, or shortness of breath.   Patient is regularly taking her hydroxyurea as prescribed without any new or severe toxicities.   She notes that she is planning on taking cruise vacation in July.   She has been eating well, but she is not staying hydrated.   Patient is not taking steroids anymore.    MEDICAL HISTORY:  Past Medical History:  Diagnosis Date   Acute duodenal ulcer without mention of hemorrhage, perforation, or obstruction    Anemia    due to the polycythemia   COVID 2022   mild case   Dysrhythmia    History of palpations, for many years   GERD (gastroesophageal reflux disease)    Headache    takes Advil   Polycythemia vera    Reflux esophagitis     SURGICAL HISTORY: Past Surgical History:  Procedure Laterality Date   ABDOMINAL HYSTERECTOMY  2011ish   BIOPSY  07/11/2020   Procedure: BIOPSY;  Surgeon: Napoleon Form, MD;  Location: [facility];  Service: Endoscopy;;   CESAREAN SECTION     x 3    CHOLECYSTECTOMY     ESOPHAGOGASTRODUODENOSCOPY (EGD) WITH PROPOFOL N/A 07/11/2020   Procedure: ESOPHAGOGASTRODUODENOSCOPY (EGD) WITH PROPOFOL;  Surgeon: Napoleon Form, MD;  Location: [facility];  Service: Endoscopy;  Laterality: N/A;   IR BONE MARROW BIOPSY & ASPIRATION   08/12/2022   ORIF ANKLE FRACTURE Left 12/21/2014   Procedure: OPEN REDUCTION INTERNAL FIXATION (ORIF) LEFT LATERAL MALLEOLUS ANKLE FRACTURE;  Surgeon: Tarry Kos, MD;  Location: [facility] OR;  Service: Orthopedics;  Laterality: Left;  Needs RNFA   SPLENECTOMY, TOTAL N/A 04/02/2022   Procedure: OPEN SPLENECTOMY;  Surgeon: Harriette Bouillon, MD;  Location: [facility] OR;  Service: General;  Laterality: N/A;  TAP BLOCK   UPPER GI ENDOSCOPY      SOCIAL HISTORY: Social History   Socioeconomic History   Marital status: Married    Spouse name: Not on file   Number of children: 3   Years of education: Not on file   Highest education level: Not on file  Occupational History   Occupation: Copy ANALYST    Employer: [employer]  Tobacco Use   Smoking status: Never   Smokeless tobacco: Never  Vaping Use   Vaping Use: Never used  Substance and Sexual Activity   Alcohol use: No   Drug use: No   Sexual activity: Yes    Birth control/protection: Surgical  Other Topics Concern   Not on file  Social History Narrative   No caffeine    Right handed   Lives at home with her husband and daughter   Social Determinants of Health   Financial Resource Strain: Not on file  Food Insecurity: No Food Insecurity (02/28/2022)   Hunger Vital Sign    Worried About Running Out of Food in the Last Year: Never true    Ran Out of Food in the Last Year: Never true  Transportation Needs: No Transportation Needs (02/28/2022)   PRAPARE - Administrator, Civil Service (Medical): No    Lack of Transportation (Non-Medical): No  Physical Activity: Not on file  Stress: Not on file  Social Connections: Not on file  Intimate Partner Violence: Not At Risk (02/28/2022)   Humiliation, Afraid, Rape, and Kick questionnaire    Fear of Current or Ex-Partner: No    Emotionally Abused: No    Physically Abused: No    Sexually Abused: No    FAMILY HISTORY: Family History  Problem Relation Age of Onset   Migraines  Mother    Breast cancer Other        Great MA    Colon cancer Neg Hx     ALLERGIES:  has No Known Allergies.  MEDICATIONS:  Current Outpatient Medications  Medication Sig Dispense Refill   acetaminophen (TYLENOL) 500 MG tablet Take 1,000 mg by mouth every 6 (six) hours as needed for moderate pain or mild pain.     apixaban (ELIQUIS) 5 MG TABS tablet Take 1 tablet (5 mg total) by mouth 2 (two) times daily. 60 tablet 2   cyclobenzaprine (FLEXERIL) 10 MG tablet Take 10 mg by mouth 3 (three) times daily as needed.     hydroxyurea (HYDREA) 500 MG capsule Take 1 capsule (500 mg total) by mouth daily. May take with food to minimize GI side effects. Start after PRBC transfusion. 30 capsule 1   omeprazole (PRILOSEC OTC) 20 MG tablet Take 20 mg by mouth daily as needed (acid reflux).     ondansetron (ZOFRAN) 8 MG tablet Take 8 mg by mouth every 8 (eight) hours as needed for nausea or vomiting.     oxyCODONE 10 MG TABS Take 1 tablet (10 mg total) by mouth every 6 (six) hours as needed for moderate pain (not releieved by tylenol). 25 tablet 0   predniSONE (DELTASONE) 50 MG tablet Take 1 tablet (50 mg total) by mouth daily with breakfast. 7 tablet 0   propranolol (INDERAL) 20 MG tablet Take 1 tablet (20 mg total) by mouth 2 (two) times daily. 60 tablet 2   No current facility-administered medications for this visit.   Facility-Administered Medications Ordered in Other Visits  Medication Dose Route Frequency Provider Last Rate Last Admin   gadopentetate dimeglumine (MAGNEVIST) injection 18 mL  18 mL Intravenous Once PRN Anson Fret, MD        REVIEW OF SYSTEMS:    10 Point review of Systems was done is negative except as noted above.  PHYSICAL EXAMINATION: ECOG PERFORMANCE STATUS: 1 - Symptomatic but completely ambulatory .BP 128/70   Pulse 93   Temp 97.9 F (36.6 C)   Resp 20   Wt 208 lb 11.2 oz (94.7 kg)   BMI 36.97 kg/m   GENERAL:alert, in no acute distress and comfortable SKIN:  no acute rashes, no significant lesions EYES: conjunctiva are pink and non-injected, sclera anicteric OROPHARYNX: MMM, no exudates, no oropharyngeal erythema or ulceration NECK: supple, no JVD LYMPH:  no palpable lymphadenopathy  in the cervical, axillary or inguinal regions LUNGS: clear to auscultation b/l with normal respiratory effort HEART: regular rate & rhythm ABDOMEN:  normoactive bowel sounds , non tender, not distended. Extremity: no pedal edema PSYCH: alert & oriented x 3 with fluent speech NEURO: no focal motor/sensory deficits  LABORATORY DATA:  I have reviewed the data as listed .    Latest Ref Rng & Units 09/30/2022   10:36 AM 08/27/2022   11:31 AM 08/12/2022    8:09 AM  CBC  WBC 4.0 - 10.5 K/uL 18.2  18.7  21.8   Hemoglobin 12.0 - 15.0 g/dL 9.9  8.7  7.9   Hematocrit 36.0 - 46.0 % 34.2  32.1  28.4   Platelets 150 - 400 K/uL 870  528  1,192    .    Latest Ref Rng & Units 09/30/2022   10:36 AM 08/27/2022   11:31 AM 08/09/2022   12:02 PM  CMP  Glucose 70 - 99 mg/dL 95  80  86   BUN 6 - 20 mg/dL 15  13  12    Creatinine 0.44 - 1.00 mg/dL 1.61  0.96  0.45   Sodium 135 - 145 mmol/L 139  139  135   Potassium 3.5 - 5.1 mmol/L 4.0  4.4  3.6   Chloride 98 - 111 mmol/L 106  105  105   CO2 22 - 32 mmol/L 23  29  23    Calcium 8.9 - 10.3 mg/dL 9.9  9.6  8.8   Total Protein 6.5 - 8.1 g/dL 7.7  7.3  8.3   Total Bilirubin 0.3 - 1.2 mg/dL 0.3  0.3  <4.0   Alkaline Phos 38 - 126 U/L 120  126  122   AST 15 - 41 U/L 14  11  20    ALT 0 - 44 U/L 10  13  16     . Lab Results  Component Value Date   LDH 194 (H) 09/30/2022   . Lab Results  Component Value Date   IRON 6 (L) 08/09/2022   TIBC 372 08/09/2022   IRONPCTSAT 2 (L) 08/09/2022   (Iron and TIBC)  Lab Results  Component Value Date   FERRITIN 6 (L) 08/09/2022      RADIOGRAPHIC STUDIES: I have personally reviewed the radiological images as listed and agreed with the findings in the report. CTA CHEST/ABD/PEL  DISSECTION W&/OR WO & GATING  Result Date: 09/21/2022 CLINICAL DATA:  History of thrombus within the infrarenal abdominal aorta EXAM: CT ANGIOGRAPHY CHEST, ABDOMEN AND PELVIS TECHNIQUE: Non-contrast CT of the chest was initially obtained. Multidetector CT imaging through the chest, abdomen and pelvis was performed using the standard protocol during bolus administration of intravenous contrast. Multiplanar reconstructed images and MIPs were obtained and reviewed to evaluate the vascular anatomy. RADIATION DOSE REDUCTION: This exam was performed according to the departmental dose-optimization program which includes automated exposure control, adjustment of the mA and/or kV according to patient size and/or use of iterative reconstruction technique. CONTRAST:  75mL OMNIPAQUE IOHEXOL 350 MG/ML SOLN COMPARISON:  Prior CT scan of the abdomen and pelvis 05/18/2022 FINDINGS: CTA CHEST FINDINGS Cardiovascular: 2 vessel aortic arch. The right brachiocephalic and left common carotid artery share a common origin. Normal caliber of the aortic root. No effacement of the sino-tubular junction. No evidence of aortic dissection significant plaque or thrombus. Trace calcification along the coronary arteries. The heart is normal in size. No pericardial effusion. Mediastinum/Nodes: Unremarkable CT appearance of the thyroid gland. No suspicious mediastinal  or hilar adenopathy. No soft tissue mediastinal mass. The thoracic esophagus is unremarkable. Lungs/Pleura: Lungs are clear. No pleural effusion or pneumothorax. Musculoskeletal: No chest wall abnormality. No acute or significant osseous findings. Review of the MIP images confirms the above findings. CTA ABDOMEN AND PELVIS FINDINGS VASCULAR Aorta: Significant interval improvement of cord-like filling defect in the infrarenal abdominal aorta which likely represented adherent mural thrombus along a focus of fibrofatty atherosclerotic plaque. Irregular atherosclerotic plaque is present  in the distal abdominal aorta and there is a focal penetrating ulceration just proximal to the aortic bifurcation resulting in dilation of the aorta to 2.1 x 1.6 cm. This is in contradistinction to the normal native diameter of 1.5 cm. Celiac: Patent without evidence of aneurysm, dissection, vasculitis or significant stenosis. SMA: Patent without evidence of aneurysm, dissection, vasculitis or significant stenosis. Renals: Both renal arteries are patent without evidence of aneurysm, dissection, vasculitis, fibromuscular dysplasia or significant stenosis. IMA: Patent without evidence of aneurysm, dissection, vasculitis or significant stenosis. Inflow: Patent without evidence of aneurysm, dissection, vasculitis or significant stenosis. Veins: Chronic portal vein thrombosis with cavernous transformation. Review of the MIP images confirms the above findings. NON-VASCULAR Hepatobiliary: Heterogeneity of the hepatic parenchyma with geographic areas of lower attenuation consistent with hepatic steatosis. The gallbladder is surgically absent. Pancreas: Unremarkable. No pancreatic ductal dilatation or surrounding inflammatory changes. Spleen: Surgically absent. Adrenals/Urinary Tract: Adrenal glands are unremarkable. Kidneys are normal, without renal calculi, focal lesion, or hydronephrosis. Bladder is unremarkable. Stomach/Bowel: Stomach is within normal limits. Appendix appears normal. No evidence of bowel wall thickening, distention, or inflammatory changes. Lymphatic: No suspicious lymphadenopathy. Reproductive: Status post hysterectomy. No adnexal masses. Other: No abdominal wall hernia or abnormality. No abdominopelvic ascites. Musculoskeletal: No acute or significant osseous findings. Review of the MIP images confirms the above findings. IMPRESSION: 1. 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 This likely represented thrombus adherent  to the underlying fibrofatty plaque which has now resolved. 2. Persistent irregular fibrofatty atherosclerotic plaque in the infrarenal abdominal aorta with a focus of penetrating atherosclerotic ulceration measuring up to 2.1 cm. Although not aneurysmal by measurement, the saccular nature of this focal ulceration warrants additional follow-up. Recommend repeat CT arteriogram of the abdomen and pelvis in 6-12 months. 3. Geographic hepatic steatosis. 4. Surgical changes of prior splenectomy. 5. 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 6. Trace atherosclerotic calcifications along the coronary arteries. Aortic Atherosclerosis (ICD10-I70.0). Signed, Sterling Big, MD, RPVI [facility] Radiology Electronically Signed   By: Malachy Moan M.D.   On: 09/21/2022 07:49    ASSESSMENT & PLAN:   Wonderful 52 year old female with:   1.  Polycythemia vera diagnosed in 2017.  She was found to have JAK2 positive disease.  She had an excellent response to [facility] with improvement in her constitutional symptoms.  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   2.  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  Follow-up with vascular surgery was recommended.   4.  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  Her platelets were decreasing although her white cell count slightly up.  Restarting Jakafi might help with this issue.  \5. S/p Splenectomy  PLAN: -Discussed lab results from today, 09/30/2022,  with the patient and her husband. CBC shows elevated WBC at 18.2 K, decreased hemoglobin of 9.9, decreased hematocrit of 34.2, and elevated platelets at 870 K. CMP is stable.   -Recommended to start baby aspirin since she was having blue toe syndrome despite anticoagulation. -continue Eliquis 5mg  po BID --recommended to stay hydrated by drinking at least 2 L water a day.  -Patient denies any toxicities with her current hydroxyurea dose.  -Recommended to continue follow-up with Dr. Barbaraann Cao for headache management and Propanol medication.  -Increase hydroxyurea to 1000 mg (2 tabs) on Monday, Wednesday and Friday and 500 mg (1 tab) on the rest of the days of the week.   FOLLOW-UP: Phone visit with Dr Candise Che in 4 weeks Labs the day prior to phone visit  The total time spent in the appointment was 30 minutes* .  All of the patient's questions were answered with apparent satisfaction. The patient knows to call the clinic with any problems, questions or concerns.   Wyvonnia Lora MD MS AAHIVMS [facility] Hematology/Oncology Physician [facility]  .*Total Encounter Time as defined by the Centers for Medicare and Medicaid Services includes, in addition to the face-to-face time of a patient visit (documented in the note above) non-face-to-face time: obtaining and reviewing outside history, ordering and reviewing medications, tests or procedures, care coordination (communications with other health care professionals or caregivers) and documentation in the medical record.    I, Ok Edwards, am acting as a Neurosurgeon for Wyvonnia Lora, MD. .I have reviewed the above documentation for accuracy and completeness, and I agree with the above.  Johney Maine MD

## 2022-09-30 NOTE — Telephone Encounter (Signed)
Patient states that at her last visit since the propanolol was successful you tapered her off of the medication.  Since then her headaches have returned and her blood pressure has increased as well.    She states that she restarted the medication herself and the headaches have gone away.    She is asking for a refill to [facility] for this medication if you are agreeable to her restarting it.

## 2022-09-30 NOTE — Addendum Note (Signed)
Addended by: Henreitta Leber on: 09/30/2022 03:25 PM   Modules accepted: Orders

## 2022-10-01 ENCOUNTER — Telehealth: Payer: Self-pay | Admitting: Hematology

## 2022-10-06 ENCOUNTER — Encounter: Payer: Self-pay | Admitting: Hematology

## 2022-10-08 ENCOUNTER — Other Ambulatory Visit: Payer: No Typology Code available for payment source

## 2022-10-08 ENCOUNTER — Ambulatory Visit: Payer: No Typology Code available for payment source | Admitting: Hematology

## 2022-10-25 ENCOUNTER — Other Ambulatory Visit: Payer: Self-pay | Admitting: *Deleted

## 2022-10-25 DIAGNOSIS — D45 Polycythemia vera: Secondary | ICD-10-CM

## 2022-10-28 ENCOUNTER — Inpatient Hospital Stay: Payer: No Typology Code available for payment source | Attending: Oncology

## 2022-10-28 ENCOUNTER — Encounter: Payer: Self-pay | Admitting: Hematology

## 2022-10-28 DIAGNOSIS — D45 Polycythemia vera: Secondary | ICD-10-CM

## 2022-10-28 LAB — CBC WITH DIFFERENTIAL (CANCER CENTER ONLY)
Abs Immature Granulocytes: 0.03 10*3/uL (ref 0.00–0.07)
Basophils Absolute: 0.7 10*3/uL — ABNORMAL HIGH (ref 0.0–0.1)
Basophils Relative: 6 %
Eosinophils Absolute: 1 10*3/uL — ABNORMAL HIGH (ref 0.0–0.5)
Eosinophils Relative: 9 %
HCT: 38.3 % (ref 36.0–46.0)
Hemoglobin: 10.7 g/dL — ABNORMAL LOW (ref 12.0–15.0)
Immature Granulocytes: 0 %
Lymphocytes Relative: 26 %
Lymphs Abs: 2.8 10*3/uL (ref 0.7–4.0)
MCH: 21.2 pg — ABNORMAL LOW (ref 26.0–34.0)
MCHC: 27.9 g/dL — ABNORMAL LOW (ref 30.0–36.0)
MCV: 75.8 fL — ABNORMAL LOW (ref 80.0–100.0)
Monocytes Absolute: 0.8 10*3/uL (ref 0.1–1.0)
Monocytes Relative: 8 %
Neutro Abs: 5.4 10*3/uL (ref 1.7–7.7)
Neutrophils Relative %: 51 %
Platelet Count: 405 10*3/uL — ABNORMAL HIGH (ref 150–400)
RBC: 5.05 MIL/uL (ref 3.87–5.11)
RDW: 24.5 % — ABNORMAL HIGH (ref 11.5–15.5)
WBC Count: 10.7 10*3/uL — ABNORMAL HIGH (ref 4.0–10.5)
nRBC: 0.2 % (ref 0.0–0.2)

## 2022-10-28 LAB — CMP (CANCER CENTER ONLY)
ALT: 12 U/L (ref 0–44)
AST: 14 U/L — ABNORMAL LOW (ref 15–41)
Albumin: 4.2 g/dL (ref 3.5–5.0)
Alkaline Phosphatase: 124 U/L (ref 38–126)
Anion gap: 6 (ref 5–15)
BUN: 13 mg/dL (ref 6–20)
CO2: 27 mmol/L (ref 22–32)
Calcium: 9.6 mg/dL (ref 8.9–10.3)
Chloride: 104 mmol/L (ref 98–111)
Creatinine: 0.88 mg/dL (ref 0.44–1.00)
GFR, Estimated: >60 mL/min (ref >60)
Glucose, Bld: 105 mg/dL — ABNORMAL HIGH (ref 70–99)
Potassium: 4.8 mmol/L (ref 3.5–5.1)
Sodium: 137 mmol/L (ref 135–145)
Total Bilirubin: 0.3 mg/dL (ref 0.3–1.2)
Total Protein: 7.8 g/dL (ref 6.5–8.1)

## 2022-10-28 LAB — RETICULOCYTES
Immature Retic Fract: 20.1 % — ABNORMAL HIGH (ref 2.3–15.9)
RBC.: 4.98 MIL/uL (ref 3.87–5.11)
Retic Count, Absolute: 25.4 10*3/uL (ref 19.0–186.0)
Retic Ct Pct: 0.5 % (ref 0.4–3.1)

## 2022-10-28 LAB — LACTATE DEHYDROGENASE: LDH: 149 U/L (ref 98–192)

## 2022-10-29 ENCOUNTER — Inpatient Hospital Stay (HOSPITAL_BASED_OUTPATIENT_CLINIC_OR_DEPARTMENT_OTHER): Payer: No Typology Code available for payment source | Admitting: Hematology

## 2022-10-29 DIAGNOSIS — D45 Polycythemia vera: Secondary | ICD-10-CM

## 2022-10-29 NOTE — Progress Notes (Signed)
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 female who has been a previous patient of Dr. 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  She was last seen by Dr. Clelia [name] on 06/21/2022 and complained of frequent vascular headaches, but was doing otherwise well overall.  Today, she is accompanied by her brother.  She confirms that her platelets have been high recently. She reports that when she was first diagnosed she was only getting phlebotomies and had continued this for 7 years. However, at the beginning of last year she needed consideration for additional treatment with [facility] for increase in her platelet counts and significant splenomegaly..  She reports that her spleen had been enlarged and she had not been symptomatic upfront. 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 The pain and spleen size limited her consumption of food. Patent reports that she was anemic prior to her splenectomy, though she is unsure of the cause.   She was initially on hydroxyurea for a short time, about a few months max, and she believes it was not suppressing her platelets. She was switched to [employer] year 5 MG twice a day, which did improve her platelets. She has been on Eliquis since November, 2024. Since then, her platelets have been very elevated. She has not previously been on any blood thinners or Aspirin.  Patient reports that Jakafi did not shrink her spleen. She discontinued it as she was told it may have a tendency to cause clots. She was then restarted on the original dose once a day after her splenectomy.  Patient was previously given narcotics by her PCP for spleen issues, which cause her to fall asleep.   She never had a bone marrow biopsy and patient's last phlebotomy was May 2022. She reports that she previously received oral iron which Irritated her ulcer. She denies any bleeding concerns.  She complains of severe headaches that began 6 months ago. She does report a Fhx of migraines. Her PCP has not ordered any scans previously to further evaluate. Patient is not on any preventative medication for her migraines.   She presented to the ED on 07/24/22 and complained of chest pain, breathing issues, pain in left arm. She describes that she felt like she was having a heart attack. To control this pain, patient regularly takes an exceedingly high dose of Tylenol 400-500 MG 5-6 times a day. She has been taking this Tylenol dose at least since September, 2023. She last took 4 tablets of Tylenol this morning. Patient has not had any chest pain since her ED visit.   She denies any change in vision, speech, or hearing, and no new weakness in her hands or feet. No chemical exposure or leg swelling. Patient reports that she only drinks 1-2 glasses of water a day.  She does complain of back pain which began October/November 2023. She presented to the chiropractor which had improved symptoms. She has a sedentary work lifestyle. She does take muscle relaxers to improve her back pain. Patient also complains of sudden muscle spasms that cause excruciating body pain.  INTERVAL HISTORY:  Eileen [name] is a wonderful 52 y.o. female who  is connected via phone for continued  evaluation and management of JAK2 positive polycythemia vera.   .I connected with Eileen [name] on 10/29/2022 at  8:40 AM EDT by telephone visit and verified that I am speaking with the correct person using two identifiers.   Patient was last seen by me on 09/30/2022 and she complained of purple/blue pigmentation on bilateral feet,  bilateral leg swelling, and bilateral leg pain.   Patient reports she has been doing well overall without any new or severe medical concerns since our last visit. She has been tolerating her new hydroxyurea dose without any new or severe toxicities. She notes that her headaches have been well controlled with Propanol.   She notes that the purple/blue pigmentation on bilateral feet has been improving since the last visit.   She denies any abnormal bleeding with baby aspirin and Eliquis.   Patient is taking a cruise vacation in July.   She denies fever, chills, night sweats, infection issues, abdominal pain, chest pain, back pain, shortness of breath, or leg swelling.   Discussed patient's lab results from 10/28/2022 with the patient.   I discussed the limitations, risks, security and privacy concerns of performing an evaluation and management service by telemedicine and the availability of in-person appointments. I also discussed with the patient that there may be a patient responsible charge related to this service. The patient expressed understanding and agreed to proceed.   Other persons participating in the visit and their role in the encounter: None   Patient's location: Home  Provider's location: [facility]   Chief Complaint:  JAK2 positive polycythemia vera     MEDICAL HISTORY:  Past Medical History:  Diagnosis Date   Acute duodenal ulcer without mention of hemorrhage, perforation, or obstruction    Anemia    due to the polycythemia   COVID 2022   mild case   Dysrhythmia    History of palpations, for many years   GERD (gastroesophageal reflux disease)    Headache    takes Advil   Polycythemia vera    Reflux esophagitis     SURGICAL HISTORY: Past Surgical History:  Procedure Laterality Date   ABDOMINAL HYSTERECTOMY  2011ish   BIOPSY  07/11/2020   Procedure: BIOPSY;  Surgeon: Eileen Form, MD;  Location: [facility];  Service: Endoscopy;;   CESAREAN SECTION      x 3    CHOLECYSTECTOMY     ESOPHAGOGASTRODUODENOSCOPY (EGD) WITH PROPOFOL N/A 07/11/2020   Procedure: ESOPHAGOGASTRODUODENOSCOPY (EGD) WITH PROPOFOL;  Surgeon: Eileen Form, MD;  Location: [facility];  Service: Endoscopy;  Laterality: N/A;   IR BONE MARROW BIOPSY & ASPIRATION  08/12/2022   ORIF ANKLE FRACTURE Left 12/21/2014   Procedure: OPEN REDUCTION INTERNAL FIXATION (ORIF) LEFT LATERAL MALLEOLUS ANKLE FRACTURE;  Surgeon: Tarry Kos, MD;  Location: [facility] OR;  Service: Orthopedics;  Laterality: Left;  Needs RNFA   SPLENECTOMY, TOTAL N/A 04/02/2022   Procedure: OPEN SPLENECTOMY;  Surgeon: Harriette Bouillon, MD;  Location: [facility] OR;  Service: General;  Laterality: N/A;  TAP BLOCK   UPPER GI ENDOSCOPY      SOCIAL HISTORY: Social History   Socioeconomic History   Marital status: Married    Spouse name: Not on file   Number of children: 3   Years of education: Not on file   Highest education level: Not on file  Occupational History   Occupation: Copy ANALYST    Employer: [employer]  Tobacco Use   Smoking status: Never  Smokeless tobacco: Never  Vaping Use   Vaping Use: Never used  Substance and Sexual Activity   Alcohol use: No   Drug use: No   Sexual activity: Yes    Birth control/protection: Surgical  Other Topics Concern   Not on file  Social History Narrative   No caffeine    Right handed   Lives at home with her husband and daughter   Social Determinants of Health   Financial Resource Strain: Not on file  Food Insecurity: No Food Insecurity (02/28/2022)   Hunger Vital Sign    Worried About Running Out of Food in the Last Year: Never true    Ran Out of Food in the Last Year: Never true  Transportation Needs: No Transportation Needs (02/28/2022)   PRAPARE - Administrator, Civil Service (Medical): No    Lack of Transportation (Non-Medical): No  Physical Activity: Not on file  Stress: Not on file  Social Connections: Not on file  Intimate  Partner Violence: Not At Risk (02/28/2022)   Humiliation, Afraid, Rape, and Kick questionnaire    Fear of Current or Ex-Partner: No    Emotionally Abused: No    Physically Abused: No    Sexually Abused: No    FAMILY HISTORY: Family History  Problem Relation Age of Onset   Migraines Mother    Breast cancer Other        Great MA    Colon cancer Neg Hx     ALLERGIES:  has No Known Allergies.  MEDICATIONS:  Current Outpatient Medications  Medication Sig Dispense Refill   acetaminophen (TYLENOL) 500 MG tablet Take 1,000 mg by mouth every 6 (six) hours as needed for moderate pain or mild pain.     apixaban (ELIQUIS) 5 MG TABS tablet Take 1 tablet (5 mg total) by mouth 2 (two) times daily. 60 tablet 2   cyclobenzaprine (FLEXERIL) 10 MG tablet Take 10 mg by mouth 3 (three) times daily as needed.     hydroxyurea (HYDREA) 500 MG capsule Take 1 capsule (500 mg total) by mouth daily. May take with food to minimize GI side effects. Start after PRBC transfusion. 30 capsule 1   omeprazole (PRILOSEC OTC) 20 MG tablet Take 20 mg by mouth daily as needed (acid reflux).     ondansetron (ZOFRAN) 8 MG tablet Take 8 mg by mouth every 8 (eight) hours as needed for nausea or vomiting.     oxyCODONE 10 MG TABS Take 1 tablet (10 mg total) by mouth every 6 (six) hours as needed for moderate pain (not releieved by tylenol). 25 tablet 0   predniSONE (DELTASONE) 50 MG tablet Take 1 tablet (50 mg total) by mouth daily with breakfast. 7 tablet 0   propranolol (INDERAL) 20 MG tablet Take 1 tablet (20 mg total) by mouth 2 (two) times daily. 60 tablet 2   No current facility-administered medications for this visit.   Facility-Administered Medications Ordered in Other Visits  Medication Dose Route Frequency Provider Last Rate Last Admin   gadopentetate dimeglumine (MAGNEVIST) injection 18 mL  18 mL Intravenous Once PRN Anson Fret, MD        REVIEW OF SYSTEMS:    10 Point review of Systems was done is  negative except as noted above.  PHYSICAL EXAMINATION: TELE-MED VISIT  LABORATORY DATA:  I have reviewed the data as listed .    Latest Ref Rng & Units 10/28/2022    3:49 PM 09/30/2022   10:36 AM 08/27/2022  11:31 AM  CBC  WBC 4.0 - 10.5 K/uL 10.7  18.2  18.7   Hemoglobin 12.0 - 15.0 g/dL 16.1  9.9  8.7   Hematocrit 36.0 - 46.0 % 38.3  34.2  32.1   Platelets 150 - 400 K/uL 405  870  528    .    Latest Ref Rng & Units 10/28/2022    3:49 PM 09/30/2022   10:36 AM 08/27/2022   11:31 AM  CMP  Glucose 70 - 99 mg/dL 096  95  80   BUN 6 - 20 mg/dL 13  15  13    Creatinine 0.44 - 1.00 mg/dL 0.45  4.09  8.11   Sodium 135 - 145 mmol/L 137  139  139   Potassium 3.5 - 5.1 mmol/L 4.8  4.0  4.4   Chloride 98 - 111 mmol/L 104  106  105   CO2 22 - 32 mmol/L 27  23  29    Calcium 8.9 - 10.3 mg/dL 9.6  9.9  9.6   Total Protein 6.5 - 8.1 g/dL 7.8  7.7  7.3   Total Bilirubin 0.3 - 1.2 mg/dL 0.3  0.3  0.3   Alkaline Phos 38 - 126 U/L 124  120  126   AST 15 - 41 U/L 14  14  11    ALT 0 - 44 U/L 12  10  13     . Lab Results  Component Value Date   LDH 149 10/28/2022   . Lab Results  Component Value Date   IRON 6 (L) 08/09/2022   TIBC 372 08/09/2022   IRONPCTSAT 2 (L) 08/09/2022   (Iron and TIBC)  Lab Results  Component Value Date   FERRITIN 6 (L) 08/09/2022      RADIOGRAPHIC STUDIES: I have personally reviewed the radiological images as listed and agreed with the findings in the report. CTA CHEST/ABD/PEL DISSECTION W&/OR WO & GATING  Result Date: 09/21/2022 CLINICAL DATA:  History of thrombus within the infrarenal abdominal aorta EXAM: CT ANGIOGRAPHY CHEST, ABDOMEN AND PELVIS TECHNIQUE: Non-contrast CT of the chest was initially obtained. Multidetector CT imaging through the chest, abdomen and pelvis was performed using the standard protocol during bolus administration of intravenous contrast. Multiplanar reconstructed images and MIPs were obtained and reviewed to evaluate the vascular  anatomy. RADIATION DOSE REDUCTION: This exam was performed according to the departmental dose-optimization program which includes automated exposure control, adjustment of the mA and/or kV according to patient size and/or use of iterative reconstruction technique. CONTRAST:  75mL OMNIPAQUE IOHEXOL 350 MG/ML SOLN COMPARISON:  Prior CT scan of the abdomen and pelvis 05/18/2022 FINDINGS: CTA CHEST FINDINGS Cardiovascular: 2 vessel aortic arch. The right brachiocephalic and left common carotid artery share a common origin. Normal caliber of the aortic root. No effacement of the sino-tubular junction. No evidence of aortic dissection significant plaque or thrombus. Trace calcification along the coronary arteries. The heart is normal in size. No pericardial effusion. Mediastinum/Nodes: Unremarkable CT appearance of the thyroid gland. No suspicious mediastinal or hilar adenopathy. No soft tissue mediastinal mass. The thoracic esophagus is unremarkable. Lungs/Pleura: Lungs are clear. No pleural effusion or pneumothorax. Musculoskeletal: No chest wall abnormality. No acute or significant osseous findings. Review of the MIP images confirms the above findings. CTA ABDOMEN AND PELVIS FINDINGS VASCULAR Aorta: Significant interval improvement of cord-like filling defect in the infrarenal abdominal aorta which likely represented adherent mural thrombus along a focus of fibrofatty atherosclerotic plaque. Irregular atherosclerotic plaque is present in the distal abdominal  aorta and there is a focal penetrating ulceration just proximal to the aortic bifurcation resulting in dilation of the aorta to 2.1 x 1.6 cm. This is in contradistinction to the normal native diameter of 1.5 cm. Celiac: Patent without evidence of aneurysm, dissection, vasculitis or significant stenosis. SMA: Patent without evidence of aneurysm, dissection, vasculitis or significant stenosis. Renals: Both renal arteries are patent without evidence of aneurysm,  dissection, vasculitis, fibromuscular dysplasia or significant stenosis. IMA: Patent without evidence of aneurysm, dissection, vasculitis or significant stenosis. Inflow: Patent without evidence of aneurysm, dissection, vasculitis or significant stenosis. Veins: Chronic portal vein thrombosis with cavernous transformation. Review of the MIP images confirms the above findings. NON-VASCULAR Hepatobiliary: Heterogeneity of the hepatic parenchyma with geographic areas of lower attenuation consistent with hepatic steatosis. The gallbladder is surgically absent. Pancreas: Unremarkable. No pancreatic ductal dilatation or surrounding inflammatory changes. Spleen: Surgically absent. Adrenals/Urinary Tract: Adrenal glands are unremarkable. Kidneys are normal, without renal calculi, focal lesion, or hydronephrosis. Bladder is unremarkable. Stomach/Bowel: Stomach is within normal limits. Appendix appears normal. No evidence of bowel wall thickening, distention, or inflammatory changes. Lymphatic: No suspicious lymphadenopathy. Reproductive: Status post hysterectomy. No adnexal masses. Other: No abdominal wall hernia or abnormality. No abdominopelvic ascites. Musculoskeletal: No acute or significant osseous findings. Review of the MIP images confirms the above findings. IMPRESSION: 1. 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 This likely represented thrombus adherent to the underlying fibrofatty plaque which has now resolved. 2. Persistent irregular fibrofatty atherosclerotic plaque in the infrarenal abdominal aorta with a focus of penetrating atherosclerotic ulceration measuring up to 2.1 cm. Although not aneurysmal by measurement, the saccular nature of this focal ulceration warrants additional follow-up. Recommend repeat CT arteriogram of the abdomen and pelvis in 6-12 months. 3. Geographic hepatic steatosis. 4. Surgical changes of prior  splenectomy. 5. 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 6. Trace atherosclerotic calcifications along the coronary arteries. Aortic Atherosclerosis (ICD10-I70.0). Signed, Sterling Big, MD, RPVI [facility] Radiology Electronically Signed   By: Malachy Moan M.D.   On: 09/21/2022 07:49    ASSESSMENT & PLAN:   Wonderful 52 year old female with:   1.  Polycythemia vera diagnosed in 2017.  She was found to have JAK2 positive disease.  She had an excellent response to [facility] with improvement in her constitutional symptoms.  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   2.  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  Follow-up with vascular surgery was recommended.   4.  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  Her platelets were decreasing although her white cell count slightly up.  Restarting Jakafi might help with this issue.  \5. S/p Splenectomy  PLAN: -Discussed lab results from 10/28/2022 with the patient. CBC shows slightly decreased hemoglobin at 10.7 g/dL and slightly elevated but improved platelets at 405 K. CMP is stable. Reticulocytes results showed elevated but improved immature reticulate at 20.1%. LDH at 149.  -Discussed that if platelets remains stable, we will discontinue baby aspirin the next visit. -Patient tolerated her new dose of hydroxyurea well without any new or severe toxicities.  -Continue hydroxyurea 1000 mg (2 tabs) Monday, Wednesday, and Friday and 500 mg (1 tab) on rest of  the days of the week. -continue Eliquis 5mg  po BID -Continue baby aspirin.   --recommended to stay hydrated by drinking at least 2 L water a  day.    FOLLOW-UP: RTC with Dr Candise Che with labs in 2 months   The total time spent in the appointment was 21 minutes* .  All of the patient's questions were answered with apparent satisfaction. The patient knows to call the clinic with any problems, questions or concerns.   Wyvonnia Lora MD MS AAHIVMS [facility] Hematology/Oncology Physician [facility]  .*Total Encounter Time as defined by the Centers for Medicare and Medicaid Services includes, in addition to the face-to-face time of a patient visit (documented in the note above) non-face-to-face time: obtaining and reviewing outside history, ordering and reviewing medications, tests or procedures, care coordination (communications with other health care professionals or caregivers) and documentation in the medical record.   I, Ok Edwards, am acting as a Neurosurgeon for Wyvonnia Lora, MD. .I have reviewed the above documentation for accuracy and completeness, and I agree with the above. Eileen Maine MD

## 2022-10-30 ENCOUNTER — Telehealth: Payer: Self-pay | Admitting: Hematology

## 2022-11-04 ENCOUNTER — Encounter: Payer: Self-pay | Admitting: Hematology

## 2022-12-08 ENCOUNTER — Other Ambulatory Visit: Payer: Self-pay | Admitting: Hematology

## 2022-12-08 DIAGNOSIS — D45 Polycythemia vera: Secondary | ICD-10-CM

## 2022-12-09 ENCOUNTER — Encounter: Payer: Self-pay | Admitting: Hematology

## 2022-12-31 ENCOUNTER — Ambulatory Visit: Payer: No Typology Code available for payment source | Admitting: Hematology

## 2022-12-31 ENCOUNTER — Other Ambulatory Visit: Payer: No Typology Code available for payment source

## 2023-01-03 ENCOUNTER — Other Ambulatory Visit: Payer: Self-pay | Admitting: Hematology

## 2023-01-03 DIAGNOSIS — D45 Polycythemia vera: Secondary | ICD-10-CM

## 2023-01-22 ENCOUNTER — Other Ambulatory Visit: Payer: Self-pay

## 2023-01-22 DIAGNOSIS — D45 Polycythemia vera: Secondary | ICD-10-CM

## 2023-01-23 ENCOUNTER — Inpatient Hospital Stay: Payer: No Typology Code available for payment source | Attending: Oncology

## 2023-01-23 ENCOUNTER — Inpatient Hospital Stay (HOSPITAL_BASED_OUTPATIENT_CLINIC_OR_DEPARTMENT_OTHER): Payer: No Typology Code available for payment source | Admitting: Hematology

## 2023-01-23 VITALS — BP 115/77 | HR 94 | Temp 98.4°F | Resp 20 | Wt 214.8 lb

## 2023-01-23 DIAGNOSIS — R519 Headache, unspecified: Secondary | ICD-10-CM | POA: Diagnosis not present

## 2023-01-23 DIAGNOSIS — D471 Chronic myeloproliferative disease: Secondary | ICD-10-CM | POA: Diagnosis not present

## 2023-01-23 DIAGNOSIS — D509 Iron deficiency anemia, unspecified: Secondary | ICD-10-CM

## 2023-01-23 DIAGNOSIS — D735 Infarction of spleen: Secondary | ICD-10-CM | POA: Insufficient documentation

## 2023-01-23 DIAGNOSIS — D75839 Thrombocytosis, unspecified: Secondary | ICD-10-CM | POA: Insufficient documentation

## 2023-01-23 DIAGNOSIS — Z7982 Long term (current) use of aspirin: Secondary | ICD-10-CM | POA: Diagnosis not present

## 2023-01-23 DIAGNOSIS — D72829 Elevated white blood cell count, unspecified: Secondary | ICD-10-CM | POA: Insufficient documentation

## 2023-01-23 DIAGNOSIS — Z7901 Long term (current) use of anticoagulants: Secondary | ICD-10-CM | POA: Diagnosis not present

## 2023-01-23 DIAGNOSIS — Z9081 Acquired absence of spleen: Secondary | ICD-10-CM | POA: Insufficient documentation

## 2023-01-23 DIAGNOSIS — Z803 Family history of malignant neoplasm of breast: Secondary | ICD-10-CM | POA: Diagnosis not present

## 2023-01-23 DIAGNOSIS — D45 Polycythemia vera: Secondary | ICD-10-CM

## 2023-01-23 LAB — CBC WITH DIFFERENTIAL (CANCER CENTER ONLY)
Abs Immature Granulocytes: 0.05 10*3/uL (ref 0.00–0.07)
Basophils Absolute: 0.5 10*3/uL — ABNORMAL HIGH (ref 0.0–0.1)
Basophils Relative: 4 %
Eosinophils Absolute: 1.2 10*3/uL — ABNORMAL HIGH (ref 0.0–0.5)
Eosinophils Relative: 10 %
HCT: 42.4 % (ref 36.0–46.0)
Hemoglobin: 12.6 g/dL (ref 12.0–15.0)
Immature Granulocytes: 0 %
Lymphocytes Relative: 24 %
Lymphs Abs: 2.9 10*3/uL (ref 0.7–4.0)
MCH: 23.2 pg — ABNORMAL LOW (ref 26.0–34.0)
MCHC: 29.7 g/dL — ABNORMAL LOW (ref 30.0–36.0)
MCV: 78.1 fL — ABNORMAL LOW (ref 80.0–100.0)
Monocytes Absolute: 1 10*3/uL (ref 0.1–1.0)
Monocytes Relative: 8 %
Neutro Abs: 6.4 10*3/uL (ref 1.7–7.7)
Neutrophils Relative %: 54 %
Platelet Count: 756 10*3/uL — ABNORMAL HIGH (ref 150–400)
RBC: 5.43 MIL/uL — ABNORMAL HIGH (ref 3.87–5.11)
RDW: 22 % — ABNORMAL HIGH (ref 11.5–15.5)
WBC Count: 12 10*3/uL — ABNORMAL HIGH (ref 4.0–10.5)
nRBC: 0.4 % — ABNORMAL HIGH (ref 0.0–0.2)

## 2023-01-23 LAB — CMP (CANCER CENTER ONLY)
ALT: 15 U/L (ref 0–44)
AST: 16 U/L (ref 15–41)
Albumin: 4.2 g/dL (ref 3.5–5.0)
Alkaline Phosphatase: 137 U/L — ABNORMAL HIGH (ref 38–126)
Anion gap: 6 (ref 5–15)
BUN: 13 mg/dL (ref 6–20)
CO2: 28 mmol/L (ref 22–32)
Calcium: 9.6 mg/dL (ref 8.9–10.3)
Chloride: 104 mmol/L (ref 98–111)
Creatinine: 0.94 mg/dL (ref 0.44–1.00)
GFR, Estimated: >60 mL/min (ref >60)
Glucose, Bld: 116 mg/dL — ABNORMAL HIGH (ref 70–99)
Potassium: 4.7 mmol/L (ref 3.5–5.1)
Sodium: 138 mmol/L (ref 135–145)
Total Bilirubin: 0.4 mg/dL (ref 0.3–1.2)
Total Protein: 8 g/dL (ref 6.5–8.1)

## 2023-01-23 LAB — RETICULOCYTES
Immature Retic Fract: 18.7 % — ABNORMAL HIGH (ref 2.3–15.9)
RBC.: 5.49 MIL/uL — ABNORMAL HIGH (ref 3.87–5.11)
Retic Count, Absolute: 74.7 10*3/uL (ref 19.0–186.0)
Retic Ct Pct: 1.4 % (ref 0.4–3.1)

## 2023-01-23 LAB — LACTATE DEHYDROGENASE: LDH: 199 U/L — ABNORMAL HIGH (ref 98–192)

## 2023-01-23 MED ORDER — HYDROXYUREA 500 MG PO CAPS: ORAL_CAPSULE | ORAL | 2 refills | Status: DC

## 2023-01-23 NOTE — Progress Notes (Signed)
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 female who has been a previous patient of Dr. 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  She was last seen by Dr. Clelia Croft on 06/21/2022 and complained of frequent vascular headaches, but was doing otherwise well overall.  Today, she is accompanied by her brother.  She confirms that her platelets have been high recently. She reports that when she was first diagnosed she was only getting phlebotomies and had continued this for 7 years. However, at the beginning of last year she needed consideration for additional treatment with [facility] for increase in her platelet counts and significant splenomegaly..  She reports that her spleen had been enlarged and she had not been symptomatic upfront. 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 The pain and spleen size limited her consumption of food. Patent reports that she was anemic prior to her splenectomy, though she is unsure of the cause.   She was initially on hydroxyurea for a short time, about a few months max, and she believes it was not suppressing her platelets. She was switched to [employer] year 5 MG twice a day, which did improve her platelets. She has been on Eliquis since November, 2024. Since then, her platelets have been very elevated. She has not previously been on any blood thinners or Aspirin.  Patient reports that Jakafi did not shrink her spleen. She discontinued it as she was told it may have a tendency to cause clots. She was then restarted on the original dose once a day after her splenectomy.  Patient was previously given narcotics by her PCP for spleen issues, which cause her to fall asleep.   She never had a bone marrow biopsy and patient's last phlebotomy was May 2022. She reports that she previously received oral iron which Irritated her ulcer. She denies any bleeding concerns.  She complains of severe headaches that began 6 months ago. She does report a Fhx of migraines. Her PCP has not ordered any scans previously to further evaluate. Patient is not on any preventative medication for her migraines.   She presented to the ED on 07/24/22 and complained of chest pain, breathing issues, pain in left arm. She describes that she felt like she was having a heart attack. To control this pain, patient regularly takes an exceedingly high dose of Tylenol 400-500 MG 5-6 times a day. She has been taking this Tylenol dose at least since September, 2023. She last took 4 tablets of Tylenol this morning. Patient has not had any chest pain since her ED visit.   She denies any change in vision, speech, or hearing, and no new weakness in her hands or feet. No chemical exposure or leg swelling. Patient reports that she only drinks 1-2 glasses of water a day.  She does complain of back pain which began October/November 2023. She presented to the chiropractor which had improved symptoms. She has a sedentary work lifestyle. She does take muscle relaxers to improve her back pain. Patient also complains of sudden muscle spasms that cause excruciating body pain.  INTERVAL HISTORY:   Eileen [name] is a wonderful 52 y.o. female who  is here today with her husband for continued evaluation and management of her JAK2 positive polycythemia vera. She notes that they had a good vacation on the cruise.  No acute new symptoms.  Headaches have been well-controlled. No new chest pain or shortness of breath. No leg pain or swelling. Patient notes that she has been fairly compliant with her hydroxyurea and does not  report any notable toxicities from this. Discussed lab results from today, 01/23/2023, with the patient.   MEDICAL HISTORY:  Past Medical History:  Diagnosis Date   Acute duodenal ulcer without mention of hemorrhage, perforation, or obstruction    Anemia    due to the polycythemia   COVID 2022   mild case   Dysrhythmia    History of palpations, for many years   GERD (gastroesophageal reflux disease)    Headache    takes Advil   Polycythemia vera (HCC)    Reflux esophagitis     SURGICAL HISTORY: Past Surgical History:  Procedure Laterality Date   ABDOMINAL HYSTERECTOMY  2011ish   BIOPSY  07/11/2020   Procedure: BIOPSY;  Surgeon: Napoleon Form, MD;  Location: [facility];  Service: Endoscopy;;   CESAREAN SECTION     x 3    CHOLECYSTECTOMY     ESOPHAGOGASTRODUODENOSCOPY (EGD) WITH PROPOFOL N/A 07/11/2020   Procedure: ESOPHAGOGASTRODUODENOSCOPY (EGD) WITH PROPOFOL;  Surgeon: Napoleon Form, MD;  Location: [facility];  Service: Endoscopy;  Laterality: N/A;   IR BONE MARROW BIOPSY & ASPIRATION  08/12/2022   ORIF ANKLE FRACTURE Left 12/21/2014   Procedure: OPEN REDUCTION INTERNAL FIXATION (ORIF) LEFT LATERAL MALLEOLUS ANKLE FRACTURE;  Surgeon: Tarry Kos, MD;  Location: [facility] OR;  Service: Orthopedics;  Laterality: Left;  Needs RNFA   SPLENECTOMY, TOTAL N/A 04/02/2022   Procedure: OPEN SPLENECTOMY;  Surgeon: Harriette Bouillon, MD;  Location: [facility] OR;  Service: General;  Laterality: N/A;  TAP BLOCK   UPPER GI ENDOSCOPY      SOCIAL HISTORY: Social History   Socioeconomic History   Marital status: Married    Spouse name: Not on file   Number of children: 3   Years of education: Not on file   Highest education level: Not on file  Occupational History   Occupation: Copy ANALYST    Employer: [employer]  Tobacco Use   Smoking status: Never   Smokeless tobacco: Never  Vaping Use   Vaping status: Never Used  Substance and Sexual Activity   Alcohol use: No    Drug use: No   Sexual activity: Yes    Birth control/protection: Surgical  Other Topics Concern   Not on file  Social History Narrative   No caffeine    Right handed   Lives at home with her husband and daughter   Social Determinants of Health   Financial Resource Strain: Not on file  Food Insecurity: No Food Insecurity (02/28/2022)   Hunger Vital Sign    Worried About Running Out of Food in the Last Year: Never true    Ran Out of Food in the Last Year: Never true  Transportation Needs: No Transportation Needs (02/28/2022)   PRAPARE - Administrator, Civil Service (Medical): No    Lack of Transportation (Non-Medical): No  Physical Activity: Not on file  Stress: Not on file  Social Connections: Unknown (10/07/2021)   [facility]   Social Network    Social Network: Not on file  Intimate Partner Violence: Not At Risk (02/28/2022)  Humiliation, Afraid, Rape, and Kick questionnaire    Fear of Current or Ex-Partner: No    Emotionally Abused: No    Physically Abused: No    Sexually Abused: No    FAMILY HISTORY: Family History  Problem Relation Age of Onset   Migraines Mother    Breast cancer Other        Great MA    Colon cancer Neg Hx     ALLERGIES:  has No Known Allergies.  MEDICATIONS:  Current Outpatient Medications  Medication Sig Dispense Refill   acetaminophen (TYLENOL) 500 MG tablet Take 1,000 mg by mouth every 6 (six) hours as needed for moderate pain or mild pain.     aspirin EC 81 MG tablet Take 1 tablet (81 mg total) by mouth daily. Swallow whole. 30 tablet 12   cyclobenzaprine (FLEXERIL) 10 MG tablet Take 10 mg by mouth 3 (three) times daily as needed.     ELIQUIS 5 MG TABS tablet Take 1 tablet by mouth twice daily 60 tablet 0   hydroxyurea (HYDREA) 500 MG capsule 1000 mg (2 tabs) on Monday, Wednesday and Friday and 500 mg (1 tab) on the rest of the days of the week. May take with food to minimize GI side effects. Start  after PRBC transfusion. 60 capsule 2   omeprazole (PRILOSEC OTC) 20 MG tablet Take 20 mg by mouth daily as needed (acid reflux).     ondansetron (ZOFRAN) 8 MG tablet Take 8 mg by mouth every 8 (eight) hours as needed for nausea or vomiting.     oxyCODONE 10 MG TABS Take 1 tablet (10 mg total) by mouth every 6 (six) hours as needed for moderate pain (not releieved by tylenol). 25 tablet 0   propranolol (INDERAL) 20 MG tablet Take 1 tablet (20 mg total) by mouth 2 (two) times daily. 60 tablet 5   No current facility-administered medications for this visit.   Facility-Administered Medications Ordered in Other Visits  Medication Dose Route Frequency Provider Last Rate Last Admin   gadopentetate dimeglumine (MAGNEVIST) injection 18 mL  18 mL Intravenous Once PRN Anson Fret, MD        REVIEW OF SYSTEMS:    10 Point review of Systems was done is negative except as noted above.  PHYSICAL EXAMINATION: TELE-MED VISIT  LABORATORY DATA:  I have reviewed the data as listed .    Latest Ref Rng & Units 01/23/2023    1:38 PM 10/28/2022    3:49 PM 09/30/2022   10:36 AM  CBC  WBC 4.0 - 10.5 K/uL 12.0  10.7  18.2   Hemoglobin 12.0 - 15.0 g/dL 16.1  09.6  9.9   Hematocrit 36.0 - 46.0 % 42.4  38.3  34.2   Platelets 150 - 400 K/uL 756  405  870    .    Latest Ref Rng & Units 01/23/2023    1:38 PM 10/28/2022    3:49 PM 09/30/2022   10:36 AM  CMP  Glucose 70 - 99 mg/dL 045  409  95   BUN 6 - 20 mg/dL 13  13  15    Creatinine 0.44 - 1.00 mg/dL 8.11  9.14  7.82   Sodium 135 - 145 mmol/L 138  137  139   Potassium 3.5 - 5.1 mmol/L 4.7  4.8  4.0   Chloride 98 - 111 mmol/L 104  104  106   CO2 22 - 32 mmol/L 28  27  23    Calcium 8.9 -  10.3 mg/dL 9.6  9.6  9.9   Total Protein 6.5 - 8.1 g/dL 8.0  7.8  7.7   Total Bilirubin 0.3 - 1.2 mg/dL 0.4  0.3  0.3   Alkaline Phos 38 - 126 U/L 137  124  120   AST 15 - 41 U/L 16  14  14    ALT 0 - 44 U/L 15  12  10     . Lab Results  Component Value Date    LDH 149 10/28/2022   . Lab Results  Component Value Date   IRON 6 (L) 08/09/2022   TIBC 372 08/09/2022   IRONPCTSAT 2 (L) 08/09/2022   (Iron and TIBC)  Lab Results  Component Value Date   FERRITIN 6 (L) 08/09/2022    RADIOGRAPHIC STUDIES: I have personally reviewed the radiological images as listed and agreed with the findings in the report. No results found.  ASSESSMENT & PLAN:   Wonderful 52 year old female with:   1.  Polycythemia vera diagnosed in 2017.  She was found to have JAK2 positive disease.  She had an excellent response to [facility] with improvement in her constitutional symptoms.  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   2.  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  Follow-up with vascular surgery was recommended.   4.  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  Her platelets were decreasing although her white cell count slightly up.  Restarting Jakafi might help with this issue.  Azaria.Perfect. S/p Splenectomy  PLAN: -Discussed lab results from .01/23/2023  with the patient.  -CBC shows hemoglobin of 12.6 with platelet count of 756k and WBC count of 12k CMP stable with normal renal and kidney function. -Patient is tolerating her current dose of hydroxyurea well without any new or severe toxicities.  -Increase hydroxyurea to 1000 mg (2 tabs) Monday, Tuesday, Wednesday, and Friday and 500 mg (1 tab) on rest of the days of the week. -continue Eliquis 5mg  po BID -Continue baby aspirin.   --recommended to stay hydrated by drinking at least 2 L water a day.    FOLLOW-UP: Return to clinic with Dr. Candise Che with labs in 2 months  The total time spent in the appointment was 21 minutes* .  All of the  patient's questions were answered with apparent satisfaction. The patient knows to call the clinic with any problems, questions or concerns.   Wyvonnia Lora MD MS AAHIVMS [facility] Hematology/Oncology Physician [facility]  .*Total Encounter Time as defined by the Centers for Medicare and Medicaid Services includes, in addition to the face-to-face time of a patient visit (documented in the note above) non-face-to-face time: obtaining and reviewing outside history, ordering and reviewing medications, tests or procedures, care coordination (communications with other health care professionals or caregivers) and documentation in the medical record.

## 2023-01-29 ENCOUNTER — Other Ambulatory Visit: Payer: Self-pay | Admitting: Hematology

## 2023-01-29 DIAGNOSIS — D45 Polycythemia vera: Secondary | ICD-10-CM

## 2023-01-30 ENCOUNTER — Encounter: Payer: Self-pay | Admitting: Hematology

## 2023-02-25 ENCOUNTER — Inpatient Hospital Stay: Payer: No Typology Code available for payment source | Attending: Oncology

## 2023-02-25 ENCOUNTER — Inpatient Hospital Stay (HOSPITAL_BASED_OUTPATIENT_CLINIC_OR_DEPARTMENT_OTHER): Payer: No Typology Code available for payment source | Admitting: Hematology

## 2023-02-25 ENCOUNTER — Other Ambulatory Visit: Payer: Self-pay | Admitting: Hematology

## 2023-02-25 ENCOUNTER — Other Ambulatory Visit: Payer: Self-pay

## 2023-02-25 VITALS — BP 125/71 | HR 97 | Temp 97.7°F | Resp 20 | Wt 219.4 lb

## 2023-02-25 DIAGNOSIS — D45 Polycythemia vera: Secondary | ICD-10-CM

## 2023-02-25 LAB — CMP (CANCER CENTER ONLY)
ALT: 13 U/L (ref 0–44)
AST: 15 U/L (ref 15–41)
Albumin: 4.1 g/dL (ref 3.5–5.0)
Alkaline Phosphatase: 127 U/L — ABNORMAL HIGH (ref 38–126)
Anion gap: 4 — ABNORMAL LOW (ref 5–15)
BUN: 15 mg/dL (ref 6–20)
CO2: 29 mmol/L (ref 22–32)
Calcium: 9.7 mg/dL (ref 8.9–10.3)
Chloride: 105 mmol/L (ref 98–111)
Creatinine: 0.96 mg/dL (ref 0.44–1.00)
GFR, Estimated: >60 mL/min (ref >60)
Glucose, Bld: 78 mg/dL (ref 70–99)
Potassium: 4.8 mmol/L (ref 3.5–5.1)
Sodium: 138 mmol/L (ref 135–145)
Total Bilirubin: 0.3 mg/dL (ref 0.3–1.2)
Total Protein: 7.9 g/dL (ref 6.5–8.1)

## 2023-02-25 LAB — RETICULOCYTES
Immature Retic Fract: 24.4 % — ABNORMAL HIGH (ref 2.3–15.9)
RBC.: 5.4 MIL/uL — ABNORMAL HIGH (ref 3.87–5.11)
Retic Count, Absolute: 78.8 10*3/uL (ref 19.0–186.0)
Retic Ct Pct: 1.5 % (ref 0.4–3.1)

## 2023-02-25 LAB — CBC WITH DIFFERENTIAL (CANCER CENTER ONLY)
Abs Immature Granulocytes: 0 10*3/uL (ref 0.00–0.07)
Band Neutrophils: 1 %
Basophils Absolute: 0.9 10*3/uL — ABNORMAL HIGH (ref 0.0–0.1)
Basophils Relative: 6 %
Eosinophils Absolute: 1.6 10*3/uL — ABNORMAL HIGH (ref 0.0–0.5)
Eosinophils Relative: 11 %
HCT: 43 % (ref 36.0–46.0)
Hemoglobin: 12.6 g/dL (ref 12.0–15.0)
Lymphocytes Relative: 28 %
Lymphs Abs: 4 10*3/uL (ref 0.7–4.0)
MCH: 22.9 pg — ABNORMAL LOW (ref 26.0–34.0)
MCHC: 29.3 g/dL — ABNORMAL LOW (ref 30.0–36.0)
MCV: 78 fL — ABNORMAL LOW (ref 80.0–100.0)
Monocytes Absolute: 0.7 10*3/uL (ref 0.1–1.0)
Monocytes Relative: 5 %
Neutro Abs: 7.1 10*3/uL (ref 1.7–7.7)
Neutrophils Relative %: 49 %
Platelet Count: 1385 10*3/uL (ref 150–400)
RBC: 5.51 MIL/uL — ABNORMAL HIGH (ref 3.87–5.11)
RDW: 22.4 % — ABNORMAL HIGH (ref 11.5–15.5)
WBC Count: 14.2 10*3/uL — ABNORMAL HIGH (ref 4.0–10.5)
nRBC: 0.2 % (ref 0.0–0.2)

## 2023-02-25 LAB — LACTATE DEHYDROGENASE: LDH: 264 U/L — ABNORMAL HIGH (ref 98–192)

## 2023-02-25 MED ORDER — HYDROXYUREA 500 MG PO CAPS: 1000.0000 mg | ORAL_CAPSULE | Freq: Every day | ORAL | 2 refills | Status: DC

## 2023-02-25 NOTE — Progress Notes (Signed)
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 female who has been a previous patient of Dr. 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  She was last seen by Dr. Clelia Croft on 06/21/2022 and complained of frequent vascular headaches, but was doing otherwise well overall.  Today, she is accompanied by her brother.  She confirms that her platelets have been high recently. She reports that when she was first diagnosed she was only getting phlebotomies and had continued this for 7 years. However, at the beginning of last year she needed consideration for additional treatment with [facility] for increase in her platelet counts and significant splenomegaly..  She reports that her spleen had been enlarged and she had not been symptomatic upfront. 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 The pain and spleen size limited her consumption of food. Patent reports that she was anemic prior to her splenectomy, though she is unsure of the cause.   She was initially on hydroxyurea for a short time, about a few months max, and she believes it was not suppressing her platelets. She was switched to [employer] year 5 MG twice a day, which did improve her platelets. She has been on Eliquis since November, 2024. Since then, her platelets have been very elevated. She has not previously been on any blood thinners or Aspirin.  Patient reports that Jakafi did not shrink her spleen. She discontinued it as she was told it may have a tendency to cause clots. She was then restarted on the original dose once a day after her splenectomy.  Patient was previously given narcotics by her PCP for spleen issues, which cause her to fall asleep.   She never had a bone marrow biopsy and patient's last phlebotomy was May 2022. She reports that she previously received oral iron which Irritated her ulcer. She denies any bleeding concerns.  She complains of severe headaches that began 6 months ago. She does report a Fhx of migraines. Her PCP has not ordered any scans previously to further evaluate. Patient is not on any preventative medication for her migraines.   She presented to the ED on 07/24/22 and complained of chest pain, breathing issues, pain in left arm. She describes that she felt like she was having a heart attack. To control this pain, patient regularly takes an exceedingly high dose of Tylenol 400-500 MG 5-6 times a day. She has been taking this Tylenol dose at least since September, 2023. She last took 4 tablets of Tylenol this morning. Patient has not had any chest pain since her ED visit.   She denies any change in vision, speech, or hearing, and no new weakness in her hands or feet. No chemical exposure or leg swelling. Patient reports that she only drinks 1-2 glasses of water a day.  She does complain of back pain which began October/November 2023. She presented to the chiropractor which had improved symptoms. She has a sedentary work lifestyle. She does take muscle relaxers to improve her back pain. Patient also complains of sudden muscle spasms that cause excruciating body pain.  INTERVAL HISTORY:   Eileen [name] is a wonderful 52 y.o. female who  is here today with her husband for continued evaluation and management of her JAK2 positive polycythemia vera.  Patient was last seen by me on 01/23/2023 and she was doing well overall.   Patient is presented with her husband during this visit. She complains of bilateral leg swelling, bilateral feet redness, headache, and body pain, hip pain, leg pain. She has been taking her  hydroxyurea 2 pill 4 days a week and 1 rest of the week. She denies missing any hydroxyurea dose. Denies abnormal bleeding, discoloration, or nose bleed.   She denies any recent infection issues, fever, chills, night sweats, chest pain, back pain, abdominal pain, vision changes, or leg swelling.  Patient notes she has not been drinking water as much as she needs to.   She denies any family with infection.  Last week, she felt tired and sluggish and then body pain started the day after.  She describes the leg pain as a muscle cramps. She also complains of burning sensation on bottom her toes.   Patient notes she had an hysterectomy in the past.   MEDICAL HISTORY:  Past Medical History:  Diagnosis Date   Acute duodenal ulcer without mention of hemorrhage, perforation, or obstruction    Anemia    due to the polycythemia   COVID 2022   mild case   Dysrhythmia    History of palpations, for many years   GERD (gastroesophageal reflux disease)    Headache    takes Advil   Polycythemia vera (HCC)    Reflux esophagitis     SURGICAL HISTORY: Past Surgical History:  Procedure Laterality Date   ABDOMINAL HYSTERECTOMY  2011ish   BIOPSY  07/11/2020   Procedure: BIOPSY;  Surgeon: Napoleon Form, MD;  Location: [facility];  Service: Endoscopy;;   CESAREAN SECTION     x 3    CHOLECYSTECTOMY     ESOPHAGOGASTRODUODENOSCOPY (EGD) WITH PROPOFOL N/A 07/11/2020   Procedure: ESOPHAGOGASTRODUODENOSCOPY (EGD) WITH PROPOFOL;  Surgeon: Napoleon Form, MD;  Location: [facility];  Service: Endoscopy;  Laterality: N/A;   IR BONE MARROW BIOPSY & ASPIRATION  08/12/2022   ORIF ANKLE FRACTURE Left 12/21/2014   Procedure: OPEN REDUCTION INTERNAL FIXATION (ORIF) LEFT LATERAL MALLEOLUS ANKLE FRACTURE;  Surgeon: Tarry Kos, MD;  Location: [facility] OR;  Service: Orthopedics;  Laterality: Left;  Needs RNFA   SPLENECTOMY, TOTAL N/A 04/02/2022   Procedure: OPEN SPLENECTOMY;  Surgeon: Harriette Bouillon, MD;   Location: [facility] OR;  Service: General;  Laterality: N/A;  TAP BLOCK   UPPER GI ENDOSCOPY      SOCIAL HISTORY: Social History   Socioeconomic History   Marital status: Married    Spouse name: Not on file   Number of children: 3   Years of education: Not on file   Highest education level: Not on file  Occupational History   Occupation: Copy ANALYST    Employer: [employer]  Tobacco Use   Smoking status: Never   Smokeless tobacco: Never  Vaping Use   Vaping status: Never Used  Substance and Sexual Activity   Alcohol use: No   Drug use: No   Sexual activity: Yes    Birth control/protection: Surgical  Other Topics Concern   Not on file  Social History Narrative   No caffeine    Right handed   Lives at home with her husband and daughter   Social Determinants of Health   Financial Resource Strain: Not on file  Food Insecurity: No Food Insecurity (02/28/2022)  Hunger Vital Sign    Worried About Running Out of Food in the Last Year: Never true    Ran Out of Food in the Last Year: Never true  Transportation Needs: No Transportation Needs (02/28/2022)   PRAPARE - Administrator, Civil Service (Medical): No    Lack of Transportation (Non-Medical): No  Physical Activity: Not on file  Stress: Not on file  Social Connections: Unknown (10/07/2021)   [facility]   Social Network    Social Network: Not on file  Intimate Partner Violence: Not At Risk (02/28/2022)   Humiliation, Afraid, Rape, and Kick questionnaire    Fear of Current or Ex-Partner: No    Emotionally Abused: No    Physically Abused: No    Sexually Abused: No    FAMILY HISTORY: Family History  Problem Relation Age of Onset   Migraines Mother    Breast cancer Other        Great MA    Colon cancer Neg Hx     ALLERGIES:  has No Known Allergies.  MEDICATIONS:  Current Outpatient Medications  Medication Sig Dispense Refill   acetaminophen (TYLENOL) 500 MG  tablet Take 1,000 mg by mouth every 6 (six) hours as needed for moderate pain or mild pain.     aspirin EC 81 MG tablet Take 1 tablet (81 mg total) by mouth daily. Swallow whole. 30 tablet 12   cyclobenzaprine (FLEXERIL) 10 MG tablet Take 10 mg by mouth 3 (three) times daily as needed.     ELIQUIS 5 MG TABS tablet Take 1 tablet by mouth twice daily 60 tablet 0   hydroxyurea (HYDREA) 500 MG capsule 1000 mg (2 tabs) on Monday, Tuesday,Wednesday and Friday and 500 mg (1 tab) on the rest of the days of the week. May take with food to minimize GI side effects. Start after PRBC transfusion. 60 capsule 2   omeprazole (PRILOSEC OTC) 20 MG tablet Take 20 mg by mouth daily as needed (acid reflux).     ondansetron (ZOFRAN) 8 MG tablet Take 8 mg by mouth every 8 (eight) hours as needed for nausea or vomiting.     oxyCODONE 10 MG TABS Take 1 tablet (10 mg total) by mouth every 6 (six) hours as needed for moderate pain (not releieved by tylenol). 25 tablet 0   propranolol (INDERAL) 20 MG tablet Take 1 tablet (20 mg total) by mouth 2 (two) times daily. 60 tablet 5   No current facility-administered medications for this visit.   Facility-Administered Medications Ordered in Other Visits  Medication Dose Route Frequency Provider Last Rate Last Admin   gadopentetate dimeglumine (MAGNEVIST) injection 18 mL  18 mL Intravenous Once PRN Anson Fret, MD        REVIEW OF SYSTEMS:    10 Point review of Systems was done is negative except as noted above.  PHYSICAL EXAMINATION: TELE-MED VISIT  LABORATORY DATA:  I have reviewed the data as listed .    Latest Ref Rng & Units 02/25/2023    1:31 PM 01/23/2023    1:38 PM 10/28/2022    3:49 PM  CBC  WBC 4.0 - 10.5 K/uL 14.2  12.0  10.7   Hemoglobin 12.0 - 15.0 g/dL 08.6  57.8  46.9   Hematocrit 36.0 - 46.0 % 43.0  42.4  38.3   Platelets 150 - 400 K/uL 1,385  756  405    .    Latest Ref Rng & Units  02/25/2023    1:31 PM 01/23/2023    1:38 PM 10/28/2022    3:49  PM  CMP  Glucose 70 - 99 mg/dL 78  161  096   BUN 6 - 20 mg/dL 15  13  13    Creatinine 0.44 - 1.00 mg/dL 0.45  4.09  8.11   Sodium 135 - 145 mmol/L 138  138  137   Potassium 3.5 - 5.1 mmol/L 4.8  4.7  4.8   Chloride 98 - 111 mmol/L 105  104  104   CO2 22 - 32 mmol/L 29  28  27    Calcium 8.9 - 10.3 mg/dL 9.7  9.6  9.6   Total Protein 6.5 - 8.1 g/dL 7.9  8.0  7.8   Total Bilirubin 0.3 - 1.2 mg/dL 0.3  0.4  0.3   Alkaline Phos 38 - 126 U/L 127  137  124   AST 15 - 41 U/L 15  16  14    ALT 0 - 44 U/L 13  15  12     . Lab Results  Component Value Date   LDH 264 (H) 02/25/2023   . Lab Results  Component Value Date   IRON 6 (L) 08/09/2022   TIBC 372 08/09/2022   IRONPCTSAT 2 (L) 08/09/2022   (Iron and TIBC)  Lab Results  Component Value Date   FERRITIN 6 (L) 08/09/2022    RADIOGRAPHIC STUDIES: I have personally reviewed the radiological images as listed and agreed with the findings in the report. No results found.  ASSESSMENT & PLAN:   Wonderful 52 year old female with:   1.  Polycythemia vera diagnosed in 2017.  She was found to have JAK2 positive disease.  She had an excellent response to [facility] with improvement in her constitutional symptoms.  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   2.  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  Follow-up with vascular surgery was recommended.   4.  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  Her platelets were decreasing although her white cell count slightly up.  Restarting Jakafi might help with this issue.  \5. S/p Splenectomy  PLAN: -Discussed lab results from today, 02/25/2023, with the patient. CBC shows elevated WBC at 14.2 K and elevated platelets at  1,385 K. CMP shows elevated alkaline phosphate level at 127, but stable overall. LDH level is pending as well.  -Recommend to stay well hydrated.  -patient will take low-dose aspirin.  -Stop taking aspirin if patient notices any bleeding.  -Can take tylenol for body pain.   -Will change her hydroxyurea medication to 2 pills everyday.  -Discussed with the patient that if increasing hydroxyurea dosage does not fix elevated platelet counts, we might change to low-dose Jakafi or similar medication.  -Answered all of patient's questions.    FOLLOW-UP: F/u as per scheduled appointments on 10/4  The total time spent in the appointment was 20 minutes* .  All of the patient's questions were answered with apparent satisfaction. The patient knows to call the clinic with any problems, questions or concerns.   Wyvonnia Lora MD MS AAHIVMS [facility] Hematology/Oncology Physician [facility]  .*Total Encounter Time as defined by the Centers for Medicare and Medicaid Services includes, in addition to the face-to-face time of a  patient visit (documented in the note above) non-face-to-face time: obtaining and reviewing outside history, ordering and reviewing medications, tests or procedures, care coordination (communications with other health care professionals or caregivers) and documentation in the medical record.   I,Param Shah,acting as a Neurosurgeon for Wyvonnia Lora, MD.,have documented all relevant documentation on the behalf of Wyvonnia Lora, MD,as directed by  Wyvonnia Lora, MD while in the presence of Wyvonnia Lora, MD.  .I have reviewed the above documentation for accuracy and completeness, and I agree with the above. Johney Maine MD

## 2023-02-25 NOTE — Progress Notes (Signed)
CRITICAL VALUE STICKER  CRITICAL VALUE: Plts 1385  DATE & TIME NOTIFIED: 02/25/23 1356  MD NOTIFIED: Candise Che  TIME OF NOTIFICATION: 1400

## 2023-02-28 ENCOUNTER — Other Ambulatory Visit: Payer: Self-pay | Admitting: Hematology

## 2023-02-28 DIAGNOSIS — D45 Polycythemia vera: Secondary | ICD-10-CM

## 2023-03-03 ENCOUNTER — Encounter: Payer: Self-pay | Admitting: Hematology

## 2023-03-13 ENCOUNTER — Other Ambulatory Visit: Payer: Self-pay

## 2023-03-13 DIAGNOSIS — D45 Polycythemia vera: Secondary | ICD-10-CM

## 2023-03-14 ENCOUNTER — Inpatient Hospital Stay (HOSPITAL_BASED_OUTPATIENT_CLINIC_OR_DEPARTMENT_OTHER): Payer: No Typology Code available for payment source | Admitting: Hematology

## 2023-03-14 ENCOUNTER — Inpatient Hospital Stay: Payer: No Typology Code available for payment source | Attending: Oncology

## 2023-03-14 ENCOUNTER — Other Ambulatory Visit: Payer: Self-pay

## 2023-03-14 VITALS — BP 122/76 | HR 86 | Temp 97.5°F | Resp 18 | Wt 219.3 lb

## 2023-03-14 DIAGNOSIS — Z9081 Acquired absence of spleen: Secondary | ICD-10-CM | POA: Insufficient documentation

## 2023-03-14 DIAGNOSIS — D509 Iron deficiency anemia, unspecified: Secondary | ICD-10-CM | POA: Diagnosis not present

## 2023-03-14 DIAGNOSIS — D735 Infarction of spleen: Secondary | ICD-10-CM | POA: Diagnosis not present

## 2023-03-14 DIAGNOSIS — D45 Polycythemia vera: Secondary | ICD-10-CM | POA: Diagnosis not present

## 2023-03-14 DIAGNOSIS — D649 Anemia, unspecified: Secondary | ICD-10-CM | POA: Diagnosis not present

## 2023-03-14 DIAGNOSIS — Z7901 Long term (current) use of anticoagulants: Secondary | ICD-10-CM | POA: Diagnosis not present

## 2023-03-14 DIAGNOSIS — I82409 Acute embolism and thrombosis of unspecified deep veins of unspecified lower extremity: Secondary | ICD-10-CM | POA: Insufficient documentation

## 2023-03-14 DIAGNOSIS — D72829 Elevated white blood cell count, unspecified: Secondary | ICD-10-CM | POA: Diagnosis not present

## 2023-03-14 LAB — CMP (CANCER CENTER ONLY)
ALT: 17 U/L (ref 0–44)
AST: 16 U/L (ref 15–41)
Albumin: 4 g/dL (ref 3.5–5.0)
Alkaline Phosphatase: 122 U/L (ref 38–126)
Anion gap: 4 — ABNORMAL LOW (ref 5–15)
BUN: 13 mg/dL (ref 6–20)
CO2: 28 mmol/L (ref 22–32)
Calcium: 9.8 mg/dL (ref 8.9–10.3)
Chloride: 107 mmol/L (ref 98–111)
Creatinine: 0.83 mg/dL (ref 0.44–1.00)
GFR, Estimated: >60 mL/min (ref >60)
Glucose, Bld: 81 mg/dL (ref 70–99)
Potassium: 4.2 mmol/L (ref 3.5–5.1)
Sodium: 139 mmol/L (ref 135–145)
Total Bilirubin: 0.3 mg/dL (ref 0.3–1.2)
Total Protein: 7.4 g/dL (ref 6.5–8.1)

## 2023-03-14 LAB — CBC WITH DIFFERENTIAL (CANCER CENTER ONLY)
Abs Immature Granulocytes: 0.04 10*3/uL (ref 0.00–0.07)
Basophils Absolute: 0.5 10*3/uL — ABNORMAL HIGH (ref 0.0–0.1)
Basophils Relative: 4 %
Eosinophils Absolute: 1.1 10*3/uL — ABNORMAL HIGH (ref 0.0–0.5)
Eosinophils Relative: 9 %
HCT: 35.8 % — ABNORMAL LOW (ref 36.0–46.0)
Hemoglobin: 10.6 g/dL — ABNORMAL LOW (ref 12.0–15.0)
Immature Granulocytes: 0 %
Lymphocytes Relative: 25 %
Lymphs Abs: 2.9 10*3/uL (ref 0.7–4.0)
MCH: 23.9 pg — ABNORMAL LOW (ref 26.0–34.0)
MCHC: 29.6 g/dL — ABNORMAL LOW (ref 30.0–36.0)
MCV: 80.6 fL (ref 80.0–100.0)
Monocytes Absolute: 1 10*3/uL (ref 0.1–1.0)
Monocytes Relative: 8 %
Neutro Abs: 6.1 10*3/uL (ref 1.7–7.7)
Neutrophils Relative %: 54 %
Platelet Count: 734 10*3/uL — ABNORMAL HIGH (ref 150–400)
RBC: 4.44 MIL/uL (ref 3.87–5.11)
RDW: 23.5 % — ABNORMAL HIGH (ref 11.5–15.5)
WBC Count: 11.6 10*3/uL — ABNORMAL HIGH (ref 4.0–10.5)
nRBC: 0.2 % (ref 0.0–0.2)

## 2023-03-14 LAB — LACTATE DEHYDROGENASE: LDH: 180 U/L (ref 98–192)

## 2023-03-14 LAB — RETICULOCYTES
Immature Retic Fract: 20.7 % — ABNORMAL HIGH (ref 2.3–15.9)
RBC.: 4.36 MIL/uL (ref 3.87–5.11)
Retic Count, Absolute: 74.1 10*3/uL (ref 19.0–186.0)
Retic Ct Pct: 1.7 % (ref 0.4–3.1)

## 2023-03-14 LAB — FERRITIN: Ferritin: 8 ng/mL — ABNORMAL LOW (ref 11–307)

## 2023-03-14 NOTE — Progress Notes (Signed)
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 female who has been a previous patient of Dr. 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  She was last seen by Dr. Clelia Croft on 06/21/2022 and complained of frequent vascular headaches, but was doing otherwise well overall.  Today, she is accompanied by her brother.  She confirms that her platelets have been high recently. She reports that when she was first diagnosed she was only getting phlebotomies and had continued this for 7 years. However, at the beginning of last year she needed consideration for additional treatment with [facility] for increase in her platelet counts and significant splenomegaly..  She reports that her spleen had been enlarged and she had not been symptomatic upfront. 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 The pain and spleen size limited her consumption of food. Patent reports that she was anemic prior to her splenectomy, though she is unsure of the cause.   She was initially on hydroxyurea for a short time, about a few months max, and she believes it was not suppressing her platelets. She was switched to [employer] year 5 MG twice a day, which did improve her platelets. She has been on Eliquis since November, 2024. Since then, her platelets have been very elevated. She has not previously been on any blood thinners or Aspirin.  Patient reports that Jakafi did not shrink her spleen. She discontinued it as she was told it may have a tendency to cause clots. She was then restarted on the original dose once a day after her splenectomy.  Patient was previously given narcotics by her PCP for spleen issues, which cause her to fall asleep.   She never had a bone marrow biopsy and patient's last phlebotomy was May 2022. She reports that she previously received oral iron which Irritated her ulcer. She denies any bleeding concerns.  She complains of severe headaches that began 6 months ago. She does report a Fhx of migraines. Her PCP has not ordered any scans previously to further evaluate. Patient is not on any preventative medication for her migraines.   She presented to the ED on 07/24/22 and complained of chest pain, breathing issues, pain in left arm. She describes that she felt like she was having a heart attack. To control this pain, patient regularly takes an exceedingly high dose of Tylenol 400-500 MG 5-6 times a day. She has been taking this Tylenol dose at least since September, 2023. She last took 4 tablets of Tylenol this morning. Patient has not had any chest pain since her ED visit.   She denies any change in vision, speech, or hearing, and no new weakness in her hands or feet. No chemical exposure or leg swelling. Patient reports that she only drinks 1-2 glasses of water a day.  She does complain of back pain which began October/November 2023. She presented to the chiropractor which had improved symptoms. She has a sedentary work lifestyle. She does take muscle relaxers to improve her back pain. Patient also complains of sudden muscle spasms that cause excruciating body pain.  INTERVAL HISTORY:   Eileen [name] is a wonderful 52 y.o. female who  is here today for continued evaluation and management of her JAK2 positive polycythemia vera.  Patient was last seen by me on 02/25/2023 and complained of bilateral leg swelling, bilateral feet redness, headache, and body pain, hip pain, leg cramping, burning sensation in toes, and tiredness/sluggishness.   Today, she is accompanied by her husband. Patient is compliantly taking 2  tablets, 1000 mg total, of hydroxyurea daily. Patient has been tolerating hydroxyurea with no new or significant toxicities.   She reports that her body pain/discomfort has improved. Patient does endorse intermittent numbness in 2-3 of her toes, but denies any pain in the area.   Patient continues to take Aspirin and blood thinners regularly.   She reports that she was previously prescribed oral iron by Dr. Clelia Croft, which she could not tolerate. Patient is not taking any oral iron at this time. She reports that her water intake could improve.   Patient denies any chest pain, SOB, abdominal pain, change in bowel habits, or nausea. She denies any bleeding issues such as black stools, blood in the stools, nose bleeds, or gum bleeds. She has no other new symptoms.   MEDICAL HISTORY:  Past Medical History:  Diagnosis Date   Acute duodenal ulcer without mention of hemorrhage, perforation, or obstruction    Anemia    due to the polycythemia   COVID 2022   mild case   Dysrhythmia    History of palpations, for many years   GERD (gastroesophageal reflux disease)    Headache    takes Advil   Polycythemia vera (HCC)    Reflux esophagitis     SURGICAL HISTORY: Past Surgical History:  Procedure Laterality Date   ABDOMINAL HYSTERECTOMY  2011ish   BIOPSY  07/11/2020   Procedure: BIOPSY;  Surgeon: Napoleon Form, MD;  Location: [facility];  Service: Endoscopy;;   CESAREAN SECTION     x 3    CHOLECYSTECTOMY     ESOPHAGOGASTRODUODENOSCOPY (EGD) WITH PROPOFOL N/A 07/11/2020   Procedure: ESOPHAGOGASTRODUODENOSCOPY (EGD) WITH PROPOFOL;  Surgeon: Napoleon Form, MD;  Location: [facility];  Service: Endoscopy;  Laterality: N/A;   IR BONE MARROW BIOPSY & ASPIRATION  08/12/2022   ORIF ANKLE FRACTURE Left 12/21/2014   Procedure: OPEN REDUCTION INTERNAL FIXATION (ORIF) LEFT LATERAL MALLEOLUS ANKLE FRACTURE;  Surgeon: Tarry Kos, MD;  Location: [facility] OR;  Service: Orthopedics;  Laterality: Left;   Needs RNFA   SPLENECTOMY, TOTAL N/A 04/02/2022   Procedure: OPEN SPLENECTOMY;  Surgeon: Harriette Bouillon, MD;  Location: [facility] OR;  Service: General;  Laterality: N/A;  TAP BLOCK   UPPER GI ENDOSCOPY      SOCIAL HISTORY: Social History   Socioeconomic History   Marital status: Married    Spouse name: Not on file   Number of children: 3   Years of education: Not on file   Highest education level: Not on file  Occupational History   Occupation: Copy ANALYST    Employer: [employer]  Tobacco Use   Smoking status: Never   Smokeless tobacco: Never  Vaping Use   Vaping status: Never Used  Substance and Sexual Activity   Alcohol use: No   Drug use: No   Sexual activity: Yes    Birth control/protection: Surgical  Other Topics Concern   Not on file  Social History Narrative   No caffeine    Right handed   Lives at home with her husband and daughter   Social Determinants of Health   Financial Resource Strain: Not on  file  Food Insecurity: No Food Insecurity (02/28/2022)   Hunger Vital Sign    Worried About Running Out of Food in the Last Year: Never true    Ran Out of Food in the Last Year: Never true  Transportation Needs: No Transportation Needs (02/28/2022)   PRAPARE - Administrator, Civil Service (Medical): No    Lack of Transportation (Non-Medical): No  Physical Activity: Not on file  Stress: Not on file  Social Connections: Unknown (10/07/2021)   [facility]   Social Network    Social Network: Not on file  Intimate Partner Violence: Not At Risk (02/28/2022)   Humiliation, Afraid, Rape, and Kick questionnaire    Fear of Current or Ex-Partner: No    Emotionally Abused: No    Physically Abused: No    Sexually Abused: No    FAMILY HISTORY: Family History  Problem Relation Age of Onset   Migraines Mother    Breast cancer Other        Great MA    Colon cancer Neg Hx     ALLERGIES:  has No Known  Allergies.  MEDICATIONS:  Current Outpatient Medications  Medication Sig Dispense Refill   acetaminophen (TYLENOL) 500 MG tablet Take 1,000 mg by mouth every 6 (six) hours as needed for moderate pain or mild pain.     aspirin EC 81 MG tablet Take 1 tablet (81 mg total) by mouth daily. Swallow whole. 30 tablet 12   cyclobenzaprine (FLEXERIL) 10 MG tablet Take 10 mg by mouth 3 (three) times daily as needed.     ELIQUIS 5 MG TABS tablet Take 1 tablet by mouth twice daily 60 tablet 0   hydroxyurea (HYDREA) 500 MG capsule Take 2 capsules (1,000 mg total) by mouth daily. May take with food to minimize GI side effects. 60 capsule 2   omeprazole (PRILOSEC OTC) 20 MG tablet Take 20 mg by mouth daily as needed (acid reflux).     ondansetron (ZOFRAN) 8 MG tablet Take 8 mg by mouth every 8 (eight) hours as needed for nausea or vomiting.     oxyCODONE 10 MG TABS Take 1 tablet (10 mg total) by mouth every 6 (six) hours as needed for moderate pain (not releieved by tylenol). 25 tablet 0   propranolol (INDERAL) 20 MG tablet Take 1 tablet (20 mg total) by mouth 2 (two) times daily. 60 tablet 5   No current facility-administered medications for this visit.   Facility-Administered Medications Ordered in Other Visits  Medication Dose Route Frequency Provider Last Rate Last Admin   gadopentetate dimeglumine (MAGNEVIST) injection 18 mL  18 mL Intravenous Once PRN Anson Fret, MD        REVIEW OF SYSTEMS:    10 Point review of Systems was done is negative except as noted above.   PHYSICAL EXAMINATION:  .BP 122/76 (BP Location: Left Arm, Patient Position: Sitting)   Pulse 86   Temp (!) 97.5 F (36.4 C) (Temporal)   Resp 18   Wt 219 lb 5 oz (99.5 kg)   SpO2 100%   BMI 38.85 kg/m   GENERAL:alert, in no acute distress and comfortable SKIN: no acute rashes, no significant lesions EYES: conjunctiva are pink and non-injected, sclera anicteric OROPHARYNX: MMM, no exudates, no oropharyngeal erythema or  ulceration NECK: supple, no JVD LYMPH:  no palpable lymphadenopathy in the cervical, axillary or inguinal regions LUNGS: clear to auscultation b/l with normal respiratory effort HEART: regular rate &  rhythm ABDOMEN:  normoactive bowel sounds , non tender, not distended. Extremity: no pedal edema PSYCH: alert & oriented x 3 with fluent speech NEURO: no focal motor/sensory deficits   LABORATORY DATA:  I have reviewed the data as listed .    Latest Ref Rng & Units 03/14/2023   11:46 AM 02/25/2023    1:31 PM 01/23/2023    1:38 PM  CBC  WBC 4.0 - 10.5 K/uL 11.6  14.2  12.0   Hemoglobin 12.0 - 15.0 g/dL 95.6  21.3  08.6   Hematocrit 36.0 - 46.0 % 35.8  43.0  42.4   Platelets 150 - 400 K/uL 734  1,385  756    .    Latest Ref Rng & Units 03/14/2023   11:46 AM 02/25/2023    1:31 PM 01/23/2023    1:38 PM  CMP  Glucose 70 - 99 mg/dL 81  78  578   BUN 6 - 20 mg/dL 13  15  13    Creatinine 0.44 - 1.00 mg/dL 4.69  6.29  5.28   Sodium 135 - 145 mmol/L 139  138  138   Potassium 3.5 - 5.1 mmol/L 4.2  4.8  4.7   Chloride 98 - 111 mmol/L 107  105  104   CO2 22 - 32 mmol/L 28  29  28    Calcium 8.9 - 10.3 mg/dL 9.8  9.7  9.6   Total Protein 6.5 - 8.1 g/dL 7.4  7.9  8.0   Total Bilirubin 0.3 - 1.2 mg/dL 0.3  0.3  0.4   Alkaline Phos 38 - 126 U/L 122  127  137   AST 15 - 41 U/L 16  15  16    ALT 0 - 44 U/L 17  13  15     . Lab Results  Component Value Date   LDH 180 03/14/2023   . Lab Results  Component Value Date   IRON 6 (L) 08/09/2022   TIBC 372 08/09/2022   IRONPCTSAT 2 (L) 08/09/2022   (Iron and TIBC)  Lab Results  Component Value Date   FERRITIN 6 (L) 08/09/2022    RADIOGRAPHIC STUDIES: I have personally reviewed the radiological images as listed and agreed with the findings in the report. No results found.  ASSESSMENT & PLAN:   Wonderful 52 year old female with:   1.  Polycythemia vera diagnosed in 2017.  She was found to have JAK2 positive disease.  She had an  excellent response to [facility] with improvement in her constitutional symptoms.  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   2.  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  Follow-up with vascular surgery was recommended.   4.  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  Her platelets were decreasing although her white cell count slightly up.  Restarting Jakafi might help with this issue.  \5. S/p Splenectomy  PLAN:  -Discussed lab results on 03/14/23 in detail with patient. CBC showed WBC of 11.6K, hemoglobin of 10.6, and platelets of 734K. -platelets improved from 1.4 million two weeks ago to at 734K -Mild anemia with hgb 10.6, anemia is not concerning at this time -WBC improved to 11.6K. Previously 14.2K two weeks ago.  -patient has permissive elevation of platelets without overdoing hydroxyurea -hemoglobin has decreased from 12.6  two weeks ago to 10.6 currently, which may be related to Hydroxyurea or potentially iron deficiency. Her iron deficiency is improving -patient has no new or significant toxicities from hydroxyurea at this time -continue taking 2 tablets, 1000 mg total, of hydroxyurea daily. -educated patient that longterm use of Asprin and bloodthinners may increase the risk of bleeding -if her platelets are stable, and she has no symptoms, there may be a role to hold aspirin down the line -recommend patient to consume iron-rich foods -recommend patient to stay UTD with age -appropriate vaccines, including flu, COVID-19 booster, pneuonia, and other age-appropriate vaccines guided by PCP -recommend patient to drink at least 64 ounces of water daily to reduce the risk of blood clots and improve energy levels   -educated patient that non-caffeinated, non-carbonated, and non-alcoholic beverages would count towards hydration  FOLLOW-UP: Phone visit with Dr Candise Che in 6 weeks Labs day before phone visit  The total time spent in the appointment was 25 minutes* .  All of the patient's questions were answered with apparent satisfaction. The patient knows to call the clinic with any problems, questions or concerns.   Wyvonnia Lora MD MS AAHIVMS [facility] Hematology/Oncology Physician [facility]  .*Total Encounter Time as defined by the Centers for Medicare and Medicaid Services includes, in addition to the face-to-face time of a patient visit (documented in the note above) non-face-to-face time: obtaining and reviewing outside history, ordering and reviewing medications, tests or procedures, care coordination (communications with other health care professionals or caregivers) and documentation in the medical record.    I,Mitra Faeizi,acting as a Neurosurgeon for Wyvonnia Lora, MD.,have documented all relevant documentation on the behalf of Wyvonnia Lora, MD,as directed by  Wyvonnia Lora, MD while in the presence of Wyvonnia Lora, MD.  .I have reviewed the above documentation for accuracy and completeness, and I agree with the above. Johney Maine MD

## 2023-03-17 ENCOUNTER — Telehealth: Payer: Self-pay | Admitting: Hematology

## 2023-03-17 NOTE — Telephone Encounter (Signed)
Left patient a message in regards to scheduling next follow up visit, left callback number when patient was ready to schedule

## 2023-03-20 ENCOUNTER — Encounter: Payer: Self-pay | Admitting: Hematology

## 2023-04-22 ENCOUNTER — Other Ambulatory Visit: Payer: Self-pay | Admitting: Hematology

## 2023-04-22 DIAGNOSIS — D45 Polycythemia vera: Secondary | ICD-10-CM

## 2023-04-25 ENCOUNTER — Emergency Department
Admission: EM | Admit: 2023-04-25 | Discharge: 2023-04-26 | Disposition: A | Discharge status: Home / Self Care | Payer: No Typology Code available for payment source | Attending: Emergency Medicine | Admitting: Emergency Medicine

## 2023-04-25 ENCOUNTER — Other Ambulatory Visit: Payer: Self-pay

## 2023-04-25 ENCOUNTER — Emergency Department: Payer: No Typology Code available for payment source

## 2023-04-25 DIAGNOSIS — D45 Polycythemia vera: Secondary | ICD-10-CM

## 2023-04-25 DIAGNOSIS — M79605 Pain in left leg: Secondary | ICD-10-CM | POA: Diagnosis present

## 2023-04-25 DIAGNOSIS — D509 Iron deficiency anemia, unspecified: Secondary | ICD-10-CM

## 2023-04-25 LAB — COMPREHENSIVE METABOLIC PANEL
ALT: 13 U/L (ref 0–44)
AST: 19 U/L (ref 15–41)
Albumin: 3.9 g/dL (ref 3.5–5.0)
Alkaline Phosphatase: 109 U/L (ref 38–126)
Anion gap: 6 (ref 5–15)
BUN: 14 mg/dL (ref 6–20)
CO2: 25 mmol/L (ref 22–32)
Calcium: 9.3 mg/dL (ref 8.9–10.3)
Chloride: 106 mmol/L (ref 98–111)
Creatinine, Ser: 0.79 mg/dL (ref 0.44–1.00)
GFR, Estimated: >60 mL/min (ref >60)
Glucose, Bld: 96 mg/dL (ref 70–99)
Potassium: 3.9 mmol/L (ref 3.5–5.1)
Sodium: 137 mmol/L (ref 135–145)
Total Bilirubin: 0.4 mg/dL (ref <1.2)
Total Protein: 7.6 g/dL (ref 6.5–8.1)

## 2023-04-25 LAB — CBC WITH DIFFERENTIAL/PLATELET
Abs Immature Granulocytes: 0.08 10*3/uL — ABNORMAL HIGH (ref 0.00–0.07)
Basophils Absolute: 0.5 10*3/uL — ABNORMAL HIGH (ref 0.0–0.1)
Basophils Relative: 3 %
Eosinophils Absolute: 1.6 10*3/uL — ABNORMAL HIGH (ref 0.0–0.5)
Eosinophils Relative: 11 %
HCT: 33.7 % — ABNORMAL LOW (ref 36.0–46.0)
Hemoglobin: 10 g/dL — ABNORMAL LOW (ref 12.0–15.0)
Immature Granulocytes: 1 %
Lymphocytes Relative: 24 %
Lymphs Abs: 3.5 10*3/uL (ref 0.7–4.0)
MCH: 22.8 pg — ABNORMAL LOW (ref 26.0–34.0)
MCHC: 29.7 g/dL — ABNORMAL LOW (ref 30.0–36.0)
MCV: 76.9 fL — ABNORMAL LOW (ref 80.0–100.0)
Monocytes Absolute: 1.1 10*3/uL — ABNORMAL HIGH (ref 0.1–1.0)
Monocytes Relative: 7 %
Neutro Abs: 7.8 10*3/uL — ABNORMAL HIGH (ref 1.7–7.7)
Neutrophils Relative %: 54 %
Platelets: 1381 10*3/uL (ref 150–400)
RBC: 4.38 MIL/uL (ref 3.87–5.11)
RDW: 23.5 % — ABNORMAL HIGH (ref 11.5–15.5)
Smear Review: INCREASED
WBC: 14.5 10*3/uL — ABNORMAL HIGH (ref 4.0–10.5)
nRBC: 0.6 % — ABNORMAL HIGH (ref 0.0–0.2)

## 2023-04-25 MED ORDER — KETOROLAC TROMETHAMINE 10 MG PO TABS: 10.0000 mg | ORAL_TABLET | Freq: Four times a day (QID) | ORAL | 0 refills | Status: DC | PRN

## 2023-04-25 MED ORDER — METHYLPREDNISOLONE 4 MG PO TBPK
ORAL_TABLET | ORAL | 0 refills | Status: DC
Start: 2023-04-25 — End: 2023-11-19

## 2023-04-25 MED ORDER — KETOROLAC TROMETHAMINE 30 MG/ML IJ SOLN
30.0000 mg | Freq: Once | INTRAMUSCULAR | Status: AC
  Administered 2023-04-26: 30 mg via INTRAVENOUS
  Filled 2023-04-25: qty 1

## 2023-04-25 MED ORDER — OXYCODONE-ACETAMINOPHEN 5-325 MG PO TABS
1.0000 | ORAL_TABLET | Freq: Once | ORAL | Status: AC
Start: 2023-04-25 — End: 2023-04-25
  Administered 2023-04-25: 1 via ORAL
  Filled 2023-04-25: qty 1

## 2023-04-25 MED ORDER — DIAZEPAM 2 MG PO TABS
2.0000 mg | ORAL_TABLET | Freq: Once | ORAL | Status: AC
  Administered 2023-04-26: 2 mg via ORAL
  Filled 2023-04-25: qty 1

## 2023-04-25 MED ORDER — IOHEXOL 350 MG/ML SOLN
125.0000 mL | Freq: Once | INTRAVENOUS | Status: AC | PRN
  Administered 2023-04-25: 125 mL via INTRAVENOUS

## 2023-04-25 MED ORDER — DEXAMETHASONE SODIUM PHOSPHATE 10 MG/ML IJ SOLN
10.0000 mg | Freq: Once | INTRAMUSCULAR | Status: AC
  Administered 2023-04-26: 10 mg via INTRAVENOUS
  Filled 2023-04-25: qty 1

## 2023-04-25 NOTE — ED Triage Notes (Signed)
Pt to ED for left leg pain with ambulation, left leg swelling x2 days. Reports hx of blood clots in spleen.

## 2023-04-25 NOTE — ED Notes (Signed)
Critical plt count - 1381

## 2023-04-25 NOTE — ED Provider Notes (Signed)
[facility] Provider Note    Event Date/Time   First MD Initiated Contact with Patient 04/25/23 1657     (approximate)   History   Leg Pain   HPI  Eileen [name] is a 52 y.o. female with PMH of polycythemia vera, splenic infarct s/p splenectomy, iron deficiency anemia presents for evaluation of left leg pain for 3 days.  Patient states it is worse with walking and has a history of blood clots so she is concerned about this.      Physical Exam   Triage Vital Signs: ED Triage Vitals  Encounter Vitals Group     BP 04/25/23 1635 (!) 143/85     Systolic BP Percentile --      Diastolic BP Percentile --      Pulse Rate 04/25/23 1635 95     Resp 04/25/23 1635 20     Temp 04/25/23 1635 98.2 F (36.8 C)     Temp src --      SpO2 04/25/23 1635 100 %     Weight 04/25/23 1636 215 lb (97.5 kg)     Height 04/25/23 1636 5\' 3"  (1.6 m)     Head Circumference --      Peak Flow --      Pain Score 04/25/23 1635 6     Pain Loc --      Pain Education --      Exclude from Growth Chart --     Most recent vital signs: Vitals:   04/25/23 1635 04/25/23 1811  BP: (!) 143/85 137/86  Pulse: 95 82  Resp: 20 18  Temp: 98.2 F (36.8 C)   SpO2: 100% 100%    General: Awake, no distress.  CV:  Good peripheral perfusion.  Resp:  Normal effort.  Abd:  No distention.  Other:  Left toes are cool to the touch with delayed capillary refill, toes are blue in color bilaterally, tender to palpation along the medial side of the leg. Dorsalis pedis pulse is 2+ and regular.   ED Results / Procedures / Treatments   Labs (all labs ordered are listed, but only abnormal results are displayed) Labs Reviewed - No data to display  RADIOLOGY  Left DVT ultrasound completed, interpreted the images as well as reviewed the radiologist report which was negative for any DVTs.  CT angio aortobifemoral pending.   PROCEDURES:  Critical Care performed:  No  Procedures   MEDICATIONS ORDERED IN ED: Medications  oxyCODONE-acetaminophen (PERCOCET/ROXICET) 5-325 MG per tablet 1 tablet (has no administration in time range)     IMPRESSION / MDM / ASSESSMENT AND PLAN / ED COURSE  I reviewed the triage vital signs and the nursing notes.                             52 year old female presents for evaluation of left leg pain.  Vital signs are stable and patient is in mild distress on exam due to pain.  Differential diagnosis includes, but is not limited to, DVT, arterial clot, muscle strain, sciatica.  Patient's presentation is most consistent with acute complicated illness / injury requiring diagnostic workup.  Left venous ultrasound obtained and was negative for DVT.  Given the difference in temperature of patient's left extremity I am most concerned about an arterial clot.  CT angio aortobifemoral is pending.  Care will be passed off to [employer] who will make the final diagnosis and  plan.      FINAL CLINICAL IMPRESSION(S) / ED DIAGNOSES   Final diagnoses:  None     Rx / DC Orders   ED Discharge Orders     None        Note:  This document was prepared using Dragon voice recognition software and may include unintentional dictation errors.   Cameron Ali, PA-C 04/25/23 Barnabas Harries, MD 04/28/23 2317

## 2023-04-25 NOTE — ED Provider Notes (Signed)
-----------------------------------------   11:15 PM on 04/25/2023 -----------------------------------------  Blood pressure 137/86, pulse 82, temperature 98 F (36.7 C), temperature source Oral, resp. rate 18, height 5\' 3"  (1.6 m), weight 97.5 kg, SpO2 100%.  Assuming care from Cruz Condon, PA-C  In short, Eileen [name] is a 52 y.o. female with a chief complaint of Leg Pain .  Refer to the original H&P for additional details.  The current plan of care is to await CT angio runoff.  Patient presents with pain in her leg.  Patient has.  A long history of vascular conditions including multiple thromboses in the past.  At this time ultrasound was negative for DVT.  Given the history of arterial thrombus in the past CT angio was ordered.  This returned without evidence of thrombus or occlusion.  After lengthy discussion with the patient regarding this I do feel that there may be a musculoskeletal component to her pain as well.  She states that sitting up and leaning forward improves the pain, laying down or straight leg/spine will increase the pain.  Pulses are intact.  Imaging was reassuring.  Will treat with Toradol, Medrol Dosepak.  Patient did have significantly elevated platelet count.  This is appreciated on previous labs as well.  Patient's platelet count is 1,381,000.  Patient is scheduled to see her hematologist in 2 days.  Again this is not atypical for the patient.  I have given significant return precautions for the patient.  Patient is agreeable with trialing medication, returning if symptoms worsen and following up with her hematologist.      ED diagnosis:   Left leg pain    Mann Skaggs, Delorise Royals, PA-C 04/25/23 2354    Minna Antis, MD 04/28/23 2317

## 2023-04-28 ENCOUNTER — Inpatient Hospital Stay: Payer: No Typology Code available for payment source | Attending: Oncology

## 2023-04-28 DIAGNOSIS — D45 Polycythemia vera: Secondary | ICD-10-CM | POA: Insufficient documentation

## 2023-04-28 DIAGNOSIS — D509 Iron deficiency anemia, unspecified: Secondary | ICD-10-CM

## 2023-04-28 LAB — CBC WITH DIFFERENTIAL (CANCER CENTER ONLY)
Abs Immature Granulocytes: 0.57 10*3/uL — ABNORMAL HIGH (ref 0.00–0.07)
Basophils Absolute: 0.5 10*3/uL — ABNORMAL HIGH (ref 0.0–0.1)
Basophils Relative: 3 %
Eosinophils Absolute: 0.5 10*3/uL (ref 0.0–0.5)
Eosinophils Relative: 3 %
HCT: 35.4 % — ABNORMAL LOW (ref 36.0–46.0)
Hemoglobin: 10.2 g/dL — ABNORMAL LOW (ref 12.0–15.0)
Immature Granulocytes: 4 %
Lymphocytes Relative: 9 %
Lymphs Abs: 1.4 10*3/uL (ref 0.7–4.0)
MCH: 22.4 pg — ABNORMAL LOW (ref 26.0–34.0)
MCHC: 28.8 g/dL — ABNORMAL LOW (ref 30.0–36.0)
MCV: 77.8 fL — ABNORMAL LOW (ref 80.0–100.0)
Monocytes Absolute: 0.5 10*3/uL (ref 0.1–1.0)
Monocytes Relative: 3 %
Neutro Abs: 11.9 10*3/uL — ABNORMAL HIGH (ref 1.7–7.7)
Neutrophils Relative %: 78 %
Platelet Count: 1830 10*3/uL (ref 150–400)
RBC: 4.55 MIL/uL (ref 3.87–5.11)
RDW: 23.4 % — ABNORMAL HIGH (ref 11.5–15.5)
WBC Count: 15.3 10*3/uL — ABNORMAL HIGH (ref 4.0–10.5)
nRBC: 4.2 % — ABNORMAL HIGH (ref 0.0–0.2)

## 2023-04-28 LAB — LACTATE DEHYDROGENASE: LDH: 197 U/L — ABNORMAL HIGH (ref 98–192)

## 2023-04-28 LAB — CMP (CANCER CENTER ONLY)
ALT: 14 U/L (ref 0–44)
AST: 17 U/L (ref 15–41)
Albumin: 4.1 g/dL (ref 3.5–5.0)
Alkaline Phosphatase: 112 U/L (ref 38–126)
Anion gap: 7 (ref 5–15)
BUN: 13 mg/dL (ref 6–20)
CO2: 25 mmol/L (ref 22–32)
Calcium: 9.4 mg/dL (ref 8.9–10.3)
Chloride: 106 mmol/L (ref 98–111)
Creatinine: 0.82 mg/dL (ref 0.44–1.00)
GFR, Estimated: >60 mL/min (ref >60)
Glucose, Bld: 177 mg/dL — ABNORMAL HIGH (ref 70–99)
Potassium: 4 mmol/L (ref 3.5–5.1)
Sodium: 138 mmol/L (ref 135–145)
Total Bilirubin: 0.3 mg/dL (ref <1.2)
Total Protein: 7.9 g/dL (ref 6.5–8.1)

## 2023-04-28 LAB — FERRITIN: Ferritin: 4 ng/mL — ABNORMAL LOW (ref 11–307)

## 2023-04-28 LAB — RETICULOCYTES
Immature Retic Fract: 34.2 % — ABNORMAL HIGH (ref 2.3–15.9)
RBC.: 4.48 MIL/uL (ref 3.87–5.11)
Retic Count, Absolute: 67.6 10*3/uL (ref 19.0–186.0)
Retic Ct Pct: 1.5 % (ref 0.4–3.1)

## 2023-04-28 NOTE — Progress Notes (Signed)
Contacted pt per Dr Candise Che : Per Dr Candise Che Patient is currently taking Hydroxyurea 2 tabs (1000mg ) po daily. She should add an additional tab (500mg ) 4 days a week on mon/Tuesday/Thursday/Friday. Will decide on need for additional IV iron based on ferritin levels.  Pt acknowledged information and verbalized understanding. Pt repeated back when and how she will take medication. Appts adjusted per Dr Candise Che and pt aware.

## 2023-04-30 ENCOUNTER — Telehealth: Payer: No Typology Code available for payment source | Admitting: Hematology

## 2023-05-21 ENCOUNTER — Other Ambulatory Visit: Payer: Self-pay | Admitting: Internal Medicine

## 2023-05-21 ENCOUNTER — Other Ambulatory Visit: Payer: Self-pay | Admitting: Hematology

## 2023-05-21 DIAGNOSIS — D45 Polycythemia vera: Secondary | ICD-10-CM

## 2023-05-22 ENCOUNTER — Other Ambulatory Visit: Payer: Self-pay

## 2023-05-22 ENCOUNTER — Encounter: Payer: Self-pay | Admitting: Hematology

## 2023-05-22 DIAGNOSIS — D45 Polycythemia vera: Secondary | ICD-10-CM

## 2023-05-22 DIAGNOSIS — D509 Iron deficiency anemia, unspecified: Secondary | ICD-10-CM

## 2023-05-23 ENCOUNTER — Inpatient Hospital Stay: Payer: No Typology Code available for payment source | Attending: Oncology

## 2023-05-23 ENCOUNTER — Inpatient Hospital Stay (HOSPITAL_BASED_OUTPATIENT_CLINIC_OR_DEPARTMENT_OTHER): Payer: No Typology Code available for payment source | Admitting: Hematology

## 2023-05-23 VITALS — BP 122/86 | HR 92 | Temp 97.0°F | Resp 20 | Wt 228.1 lb

## 2023-05-23 DIAGNOSIS — D45 Polycythemia vera: Secondary | ICD-10-CM

## 2023-05-23 DIAGNOSIS — Z79899 Other long term (current) drug therapy: Secondary | ICD-10-CM | POA: Diagnosis not present

## 2023-05-23 DIAGNOSIS — D509 Iron deficiency anemia, unspecified: Secondary | ICD-10-CM | POA: Diagnosis not present

## 2023-05-23 DIAGNOSIS — Z7901 Long term (current) use of anticoagulants: Secondary | ICD-10-CM | POA: Diagnosis not present

## 2023-05-23 DIAGNOSIS — Z7982 Long term (current) use of aspirin: Secondary | ICD-10-CM | POA: Diagnosis not present

## 2023-05-23 LAB — CBC WITH DIFFERENTIAL (CANCER CENTER ONLY)
Abs Immature Granulocytes: 0.03 10*3/uL (ref 0.00–0.07)
Basophils Absolute: 0.3 10*3/uL — ABNORMAL HIGH (ref 0.0–0.1)
Basophils Relative: 4 %
Eosinophils Absolute: 0.9 10*3/uL — ABNORMAL HIGH (ref 0.0–0.5)
Eosinophils Relative: 10 %
HCT: 34.4 % — ABNORMAL LOW (ref 36.0–46.0)
Hemoglobin: 10 g/dL — ABNORMAL LOW (ref 12.0–15.0)
Immature Granulocytes: 0 %
Lymphocytes Relative: 31 %
Lymphs Abs: 2.9 10*3/uL (ref 0.7–4.0)
MCH: 22.9 pg — ABNORMAL LOW (ref 26.0–34.0)
MCHC: 29.1 g/dL — ABNORMAL LOW (ref 30.0–36.0)
MCV: 78.9 fL — ABNORMAL LOW (ref 80.0–100.0)
Monocytes Absolute: 0.8 10*3/uL (ref 0.1–1.0)
Monocytes Relative: 9 %
Neutro Abs: 4.5 10*3/uL (ref 1.7–7.7)
Neutrophils Relative %: 46 %
Platelet Count: 1127 10*3/uL (ref 150–400)
RBC: 4.36 MIL/uL (ref 3.87–5.11)
RDW: 25.4 % — ABNORMAL HIGH (ref 11.5–15.5)
WBC Count: 9.4 10*3/uL (ref 4.0–10.5)
nRBC: 0.5 % — ABNORMAL HIGH (ref 0.0–0.2)

## 2023-05-23 LAB — CMP (CANCER CENTER ONLY)
ALT: 11 U/L (ref 0–44)
AST: 14 U/L — ABNORMAL LOW (ref 15–41)
Albumin: 4.2 g/dL (ref 3.5–5.0)
Alkaline Phosphatase: 111 U/L (ref 38–126)
Anion gap: 6 (ref 5–15)
BUN: 12 mg/dL (ref 6–20)
CO2: 27 mmol/L (ref 22–32)
Calcium: 9.5 mg/dL (ref 8.9–10.3)
Chloride: 107 mmol/L (ref 98–111)
Creatinine: 0.83 mg/dL (ref 0.44–1.00)
GFR, Estimated: >60 mL/min (ref >60)
Glucose, Bld: 92 mg/dL (ref 70–99)
Potassium: 4.2 mmol/L (ref 3.5–5.1)
Sodium: 140 mmol/L (ref 135–145)
Total Bilirubin: 0.3 mg/dL (ref <1.2)
Total Protein: 7.6 g/dL (ref 6.5–8.1)

## 2023-05-23 LAB — RETICULOCYTES
Immature Retic Fract: 17.3 % — ABNORMAL HIGH (ref 2.3–15.9)
RBC.: 4.34 MIL/uL (ref 3.87–5.11)
Retic Count, Absolute: 32.6 10*3/uL (ref 19.0–186.0)
Retic Ct Pct: 0.8 % (ref 0.4–3.1)

## 2023-05-23 LAB — LACTATE DEHYDROGENASE: LDH: 172 U/L (ref 98–192)

## 2023-05-23 LAB — FERRITIN: Ferritin: 5 ng/mL — ABNORMAL LOW (ref 11–307)

## 2023-05-23 NOTE — Progress Notes (Signed)
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 female who has been a previous patient of Dr. 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  She was last seen by Dr. Clelia Croft on 06/21/2022 and complained of frequent vascular headaches, but was doing otherwise well overall.  Today, she is accompanied by her brother.  She confirms that her platelets have been high recently. She reports that when she was first diagnosed she was only getting phlebotomies and had continued this for 7 years. However, at the beginning of last year she needed consideration for additional treatment with [facility] for increase in her platelet counts and significant splenomegaly..  She reports that her spleen had been enlarged and she had not been symptomatic upfront. 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 The pain and spleen size limited her consumption of food. Patent reports that she was anemic prior to her splenectomy, though she is unsure of the cause.   She was initially on hydroxyurea for a short time, about a few months max, and she believes it was not suppressing her platelets. She was switched to [employer] year 5 MG twice a day, which did improve her platelets. She has been on Eliquis since November, 2024. Since then, her platelets have been very elevated. She has not previously been on any blood thinners or Aspirin.  Patient reports that Jakafi did not shrink her spleen. She discontinued it as she was told it may have a tendency to cause clots. She was then restarted on the original dose once a day after her splenectomy.  Patient was previously given narcotics by her PCP for spleen issues, which cause her to fall asleep.   She never had a bone marrow biopsy and patient's last phlebotomy was May 2022. She reports that she previously received oral iron which Irritated her ulcer. She denies any bleeding concerns.  She complains of severe headaches that began 6 months ago. She does report a Fhx of migraines. Her PCP has not ordered any scans previously to further evaluate. Patient is not on any preventative medication for her migraines.   She presented to the ED on 07/24/22 and complained of chest pain, breathing issues, pain in left arm. She describes that she felt like she was having a heart attack. To control this pain, patient regularly takes an exceedingly high dose of Tylenol 400-500 MG 5-6 times a day. She has been taking this Tylenol dose at least since September, 2023. She last took 4 tablets of Tylenol this morning. Patient has not had any chest pain since her ED visit.   She denies any change in vision, speech, or hearing, and no new weakness in her hands or feet. No chemical exposure or leg swelling. Patient reports that she only drinks 1-2 glasses of water a day.  She does complain of back pain which began October/November 2023. She presented to the chiropractor which had improved symptoms. She has a sedentary work lifestyle. She does take muscle relaxers to improve her back pain. Patient also complains of sudden muscle spasms that cause excruciating body pain.  INTERVAL HISTORY:   Eileen [name] is a wonderful 52 y.o. female who  is here today for continued evaluation and management of her JAK2 positive polycythemia vera.  Patient was last seen by me on 03/14/2023 and reported intermittent numbness in 2-3 of her toes.    She presented to the ED on 04/25/2023 for left lower extremity pain.   Today, she is accompanied by her brother-in-law. She complains of positional pain in her left posterior leg with  intermittent swelling. The pain is present when walking/standing, but not when sitting. She reports fluctuating severity. Patient denies any local injuries to the area. She denies any redness in the area. Patient reports that it was thought that she may have had a blood clot due to differences in temperature between her legs. US showed normal findings. Patient was told that it is possible that she may be in the beginning stages of a blood clot. She is currently on two blood thinners at this time. Patient denies any bleeding issues, such as nose bleeds, gum bleeds, black stools, or blood in the stools.   Patient was seen by a chiropractor and was told to have some tightness in her back. She received chiropractic adjustments which improved back pain, though her leg pain continued to persist.   Patient has been taking three tablets of Hydroxyurea three times a week (MWF), and two tablets on remaining days of the week. She denies any major toxicity issues with Hydroxyurea.   She complains of fatigue and low energy. She denies any fever, chills, or night sweats.   She reports that her previous pain and tingling in hands/feet have resolved. Patient reports occasional purple discoloration in toes.   Patient notes that she previously tolerated IV iron infusions well with no toxicity issues. She notes that she was previously on a JAK2 inhibitor in the past.   MEDICAL HISTORY:  Past Medical History:  Diagnosis Date   Acute duodenal ulcer without mention of hemorrhage, perforation, or obstruction    Anemia    due to the polycythemia   COVID 2022   mild case   Dysrhythmia    History of palpations, for many years   GERD (gastroesophageal reflux disease)    Headache    takes Advil   Polycythemia vera (HCC)    Reflux esophagitis     SURGICAL HISTORY: Past Surgical History:  Procedure Laterality Date   ABDOMINAL HYSTERECTOMY  2011ish   BIOPSY  07/11/2020   Procedure: BIOPSY;  Surgeon: Napoleon Form, MD;  Location: [facility];  Service: Endoscopy;;   CESAREAN SECTION     x 3    CHOLECYSTECTOMY     ESOPHAGOGASTRODUODENOSCOPY (EGD) WITH PROPOFOL N/A 07/11/2020   Procedure: ESOPHAGOGASTRODUODENOSCOPY (EGD) WITH PROPOFOL;  Surgeon: Napoleon Form, MD;  Location: [facility];  Service: Endoscopy;  Laterality: N/A;   IR BONE MARROW BIOPSY & ASPIRATION  08/12/2022   ORIF ANKLE FRACTURE Left 12/21/2014   Procedure: OPEN REDUCTION INTERNAL FIXATION (ORIF) LEFT LATERAL MALLEOLUS ANKLE FRACTURE;  Surgeon: Tarry Kos, MD;  Location: [facility] OR;  Service: Orthopedics;  Laterality: Left;  Needs RNFA   SPLENECTOMY, TOTAL N/A 04/02/2022   Procedure: OPEN SPLENECTOMY;  Surgeon: Harriette Bouillon, MD;  Location: [facility] OR;  Service: General;  Laterality: N/A;  TAP BLOCK   UPPER GI ENDOSCOPY      SOCIAL HISTORY: Social History   Socioeconomic History   Marital status: Married    Spouse name: Not on file   Number of children: 3   Years of education: Not on file   Highest education level:  Not on file  Occupational History   Occupation: Audiological scientist    Employer: [employer]vertising [employer]  Tobacco Use   Smoking status: Never   Smokeless tobacco: Never  Vaping Use   Vaping status: Never Used  Substance and Sexual Activity   Alcohol use: No   Drug use: No   Sexual activity: Yes    Birth control/protection: Surgical  Other Topics Concern   Not on file  Social History Narrative   No caffeine    Right handed   Lives at home with her husband and daughter   Social Drivers of Corporate investment banker Strain: Not on file  Food Insecurity: No Food Insecurity (02/28/2022)   Hunger Vital Sign    Worried About Running Out of Food in the Last Year: Never true    Ran Out of Food in the Last Year: Never true  Transportation Needs: No Transportation Needs (02/28/2022)   PRAPARE - Administrator, Civil Service (Medical): No    Lack of Transportation (Non-Medical): No  Physical  Activity: Not on file  Stress: Not on file  Social Connections: Unknown (10/07/2021)   [facility]   Social Network    Social Network: Not on file  Intimate Partner Violence: Not At Risk (02/28/2022)   Humiliation, Afraid, Rape, and Kick questionnaire    Fear of Current or Ex-Partner: No    Emotionally Abused: No    Physically Abused: No    Sexually Abused: No    FAMILY HISTORY: Family History  Problem Relation Age of Onset   Migraines Mother    Breast cancer Other        Great MA    Colon cancer Neg Hx     ALLERGIES:  has no known allergies.  MEDICATIONS:  Current Outpatient Medications  Medication Sig Dispense Refill   acetaminophen (TYLENOL) 500 MG tablet Take 1,000 mg by mouth every 6 (six) hours as needed for moderate pain or mild pain.     aspirin EC 81 MG tablet Take 1 tablet (81 mg total) by mouth daily. Swallow whole. 30 tablet 12   cyclobenzaprine (FLEXERIL) 10 MG tablet Take 10 mg by mouth 3 (three) times daily as needed.     ELIQUIS 5 MG TABS tablet Take 1 tablet by mouth twice daily 60 tablet 0   hydroxyurea (HYDREA) 500 MG capsule Take 2 capsules (1,000 mg total) by mouth daily. May take with food to minimize GI side effects. 60 capsule 2   ketorolac (TORADOL) 10 MG tablet Take 1 tablet (10 mg total) by mouth every 6 (six) hours as needed. 20 tablet 0   methylPREDNISolone (MEDROL DOSEPAK) 4 MG TBPK tablet Take 6 on day 1, 5 on day 2, 4 on day 3, 3 on day 4, 2 on day 5, 1 on day 6 21 tablet 0   omeprazole (PRILOSEC OTC) 20 MG tablet Take 20 mg by mouth daily as needed (acid reflux).     ondansetron (ZOFRAN) 8 MG tablet Take 8 mg by mouth every 8 (eight) hours as needed for nausea or vomiting.     oxyCODONE 10 MG TABS Take 1 tablet (10 mg total) by mouth every 6 (six) hours as needed for moderate pain (not releieved by tylenol). 25 tablet 0   propranolol (INDERAL) 20 MG tablet Take 1 tablet by mouth twice daily 60 tablet 0   No current  facility-administered medications for this visit.   Facility-Administered Medications Ordered in Other  Visits  Medication Dose Route Frequency Provider Last Rate Last Admin   gadopentetate dimeglumine (MAGNEVIST) injection 18 mL  18 mL Intravenous Once PRN Anson Fret, MD        REVIEW OF SYSTEMS:    10 Point review of Systems was done is negative except as noted above.   PHYSICAL EXAMINATION:  .There were no vitals taken for this visit.   GENERAL:alert, in no acute distress and comfortable SKIN: no acute rashes, no significant lesions EYES: conjunctiva are pink and non-injected, sclera anicteric OROPHARYNX: MMM, no exudates, no oropharyngeal erythema or ulceration NECK: supple, no JVD LYMPH:  no palpable lymphadenopathy in the cervical, axillary or inguinal regions LUNGS: clear to auscultation b/l with normal respiratory effort HEART: regular rate & rhythm ABDOMEN:  normoactive bowel sounds , non tender, not distended. Extremity: no pedal edema PSYCH: alert & oriented x 3 with fluent speech NEURO: no focal motor/sensory deficits    LABORATORY DATA:  I have reviewed the data as listed .    Latest Ref Rng & Units 04/28/2023   12:06 PM 04/25/2023    8:13 PM 03/14/2023   11:46 AM  CBC  WBC 4.0 - 10.5 K/uL 15.3  14.5  11.6   Hemoglobin 12.0 - 15.0 g/dL 16.1  09.6  04.5   Hematocrit 36.0 - 46.0 % 35.4  33.7  35.8   Platelets 150 - 400 K/uL 1,830  1,381  734    .    Latest Ref Rng & Units 04/28/2023   12:06 PM 04/25/2023    8:13 PM 03/14/2023   11:46 AM  CMP  Glucose 70 - 99 mg/dL 409  96  81   BUN 6 - 20 mg/dL 13  14  13    Creatinine 0.44 - 1.00 mg/dL 8.11  9.14  7.82   Sodium 135 - 145 mmol/L 138  137  139   Potassium 3.5 - 5.1 mmol/L 4.0  3.9  4.2   Chloride 98 - 111 mmol/L 106  106  107   CO2 22 - 32 mmol/L 25  25  28    Calcium 8.9 - 10.3 mg/dL 9.4  9.3  9.8   Total Protein 6.5 - 8.1 g/dL 7.9  7.6  7.4   Total Bilirubin <1.2 mg/dL 0.3  0.4  0.3   Alkaline  Phos 38 - 126 U/L 112  109  122   AST 15 - 41 U/L 17  19  16    ALT 0 - 44 U/L 14  13  17     . Lab Results  Component Value Date   LDH 197 (H) 04/28/2023   . Lab Results  Component Value Date   IRON 6 (L) 08/09/2022   TIBC 372 08/09/2022   IRONPCTSAT 2 (L) 08/09/2022   (Iron and TIBC)  Lab Results  Component Value Date   FERRITIN 4 (L) 04/28/2023    RADIOGRAPHIC STUDIES: I have personally reviewed the radiological images as listed and agreed with the findings in the report. CT Angio Aortobifemoral W and/or Wo Contrast Result Date: 04/25/2023 CLINICAL DATA:  Left leg pain and swelling/claudication with negative left lower extremity Doppler venous study today. EXAM: CT ANGIOGRAPHY OF ABDOMINAL AORTA WITH ILIOFEMORAL RUNOFF TECHNIQUE: Multidetector CT imaging of the abdomen, pelvis and lower extremities was performed using the standard protocol during bolus administration of intravenous contrast. Multiplanar CT image reconstructions and MIPs were obtained to evaluate the vascular anatomy. RADIATION DOSE REDUCTION: This exam was performed according to the departmental dose-optimization program  which includes automated exposure control, adjustment of the mA and/or kV according to patient size and/or use of iterative reconstruction technique. CONTRAST:  OMNIPAQUE IOHEXOL 350 MG/ML SOLN COMPARISON:  No prior CTA runoff, but we do have a CTA chest, abdomen and pelvis and the report available from 09/20/2022. FINDINGS: VASCULAR Aorta: The normal abdominal aortic caliber is 1.6 cm. There is irregular abdominal aortic soft plaque/thrombus again causing no more than 25-30% luminal narrowing with no flow-limiting stenosis or dissection. Again, penetrating atherosclerotic ulceration just above the bifurcation widens the aortic diameter to 1.9 by 1.6 cm, unchanged. Celiac: Patent without evidence of aneurysm, dissection, vasculitis or significant stenosis. There is a patent, separate aortic origin  for the left gastric artery arising just proximal to the celiac trunk. SMA: Patent without evidence of aneurysm, dissection, vasculitis or significant stenosis. Renals: 2 arteries supply the right kidney and a single artery supplies the left. All three vessels are normal. No branch occlusion. IMA: Proximally occluded as before but it appears to reconstitute in the pelvis via collaterals. RIGHT Lower Extremity Inflow: Common, internal and external iliac arteries are patent without evidence of aneurysm, dissection, vasculitis or significant stenosis. Outflow: Common, superficial and profunda femoral arteries and the popliteal artery are patent without evidence of aneurysm, dissection, vasculitis or significant stenosis. Runoff: Patent three vessel runoff to the ankle, with dominant arterial flow through the peroneal artery and a low posterior tibial artery takeoff just above the ankle. LEFT Lower Extremity Inflow: Common, internal and external iliac arteries are patent without evidence of aneurysm, dissection, vasculitis or significant stenosis. Outflow: Common, superficial and profunda femoral arteries and the popliteal artery are patent without evidence of aneurysm, dissection, vasculitis or significant stenosis. Runoff: Patent three vessel runoff to the ankle, with dominant arterial flow via the peroneal artery and a low posterior tibial artery takeoff just above the ankle. Veins: Unopacified and not evaluated. Review of the MIP images confirms the above findings. NON-VASCULAR Lower chest: There are clear lung bases with mild chronic elevation right hemidiaphragm. The cardiac size is normal. Hepatobiliary: No focal liver abnormality is seen. Status post cholecystectomy. No biliary dilatation. Pancreas: Fatty infiltration noted without mass or ductal dilatation. There are stranding changes along the pancreaticoduodenal groove and descending duodenum. This could all be due to duodenitis or could be a combination of  pancreatitis and duodenitis. Correlate with serum lipase for possible significance. Spleen: Surgically absent. Adrenals/Urinary Tract: Adrenal glands are unremarkable. Kidneys are normal, without renal calculi, focal lesion, or hydronephrosis. Bladder is unremarkable. Stomach/Bowel: The gastric wall is unremarkable. There are thickened folds in the descending and proximal horizontal duodenum. Rest of the small bowel is normal caliber and unremarkable. Normal retrocecal appendix. Colonic diverticula noted without evidence of focal colitis or diverticulitis. Lymphatic: No adenopathy. Reproductive: Status post hysterectomy. No adnexal masses. Multiple pelvic phleboliths. Other: No abdominal wall hernia or abnormality. No abdominopelvic ascites. Musculoskeletal: Chronic L3 and 4 pars defects, at L3-4 and L4-5 with mild grade 1 spondylolisthesis, unchanged. There is facet hypertrophy and spondylosis in the lumbar spine, spondylosis and multilevel degenerative discs thoracic spine. IMPRESSION: 1. No acute vascular abnormality. 2. CTA runoff study is negative apart from normal variant low origins of the posterior tibial arteries. 3. Irregular abdominal aortic soft plaque/thrombus again causing no more than 25-30% luminal narrowing. 4. Penetrating atherosclerotic ulceration just above the bifurcation widens the aortic diameter to 1.9 x 1.6 cm, unchanged. 5. Proximal occlusion of the IMA as before but it appears to reconstitute in the pelvis  via collaterals. 6. Stranding changes along the pancreaticoduodenal groove and descending duodenum, which could all be due to duodenitis or could be a combination of pancreatitis and duodenitis. Correlate with serum lipase for possible significance. Endoscopy may be indicated. 7. Colonic diverticulosis without evidence of diverticulitis. 8. Chronic L3 and 4 pars defects with mild 2-level grade 1 spondylolisthesis, unchanged. 9. Degenerative changes in the spine.  Surgically absent  spleen. Aortic Atherosclerosis (ICD10-I70.0). Electronically Signed   By: Almira Bar M.D.   On: 04/25/2023 22:57   US Venous Img Lower Unilateral Left Result Date: 04/25/2023 CLINICAL DATA:  Left lower extremity pain and edema. EXAM: LEFT LOWER EXTREMITY VENOUS DOPPLER ULTRASOUND TECHNIQUE: Gray-scale sonography with graded compression, as well as color Doppler and duplex ultrasound were performed to evaluate the lower extremity deep venous systems from the level of the common femoral vein and including the common femoral, femoral, profunda femoral, popliteal and calf veins including the posterior tibial, peroneal and gastrocnemius veins when visible. The superficial great saphenous vein was also interrogated. Spectral Doppler was utilized to evaluate flow at rest and with distal augmentation maneuvers in the common femoral, femoral and popliteal veins. COMPARISON:  None Available. FINDINGS: Contralateral Common Femoral Vein: Respiratory phasicity is normal and symmetric with the symptomatic side. No evidence of thrombus. Normal compressibility. Common Femoral Vein: No evidence of thrombus. Normal compressibility, respiratory phasicity and response to augmentation. Saphenofemoral Junction: No evidence of thrombus. Normal compressibility and flow on color Doppler imaging. Profunda Femoral Vein: No evidence of thrombus. Normal compressibility and flow on color Doppler imaging. Femoral Vein: No evidence of thrombus. Normal compressibility, respiratory phasicity and response to augmentation. Popliteal Vein: No evidence of thrombus. Normal compressibility, respiratory phasicity and response to augmentation. Calf Veins: No evidence of thrombus. Normal compressibility and flow on color Doppler imaging. Superficial Great Saphenous Vein: No evidence of thrombus. Normal compressibility. Venous Reflux:  None. Other Findings: No evidence of superficial thrombophlebitis or abnormal fluid collection. IMPRESSION: No  evidence of left lower extremity deep venous thrombosis. Electronically Signed   By: Irish Lack M.D.   On: 04/25/2023 17:24    ASSESSMENT & PLAN:   Wonderful 52 year old female with:   1.  Polycythemia vera diagnosed in 2017.  She was found to have JAK2 positive disease.  She had an excellent response to [facility] with improvement in her constitutional symptoms.  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   2.  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  Follow-up with vascular surgery was recommended.   4.  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  Her platelets were decreasing although her white cell count slightly up.  Restarting Jakafi might help with this issue.  \5. S/p Splenectomy  PLAN:  -Discussed lab results on 05/23/23 in detail with patient. CBC showed WBC of 9.4K, hemoglobin of 10.0, and platelets of 1127K. -WBC normalized -discussed that there will be some variability in platelets given lack of spleen -discussed that iron deficiency could be playing into elevated platelets -CMP stable -iron labs today pending. There may be a role for IV iron depending on findings.  -iron replacement may improve platelets in addition to hemoglobin -patient has no new or significant toxicities from hydroxyurea at this time  -continue taking three tablets of Hydroxyurea three times a week (  MWF), and two tablets on rest of the days of the week. -discussed the need to balance hydroxyurea dose with the goal of keeping her platelets suppressed as much as her other blood counts with allow -there is no concern for polycythemia at this time -discussed that there may be a role for a JAK2 enzyme inhibitor if needed if her blood  counts are not reasonably controlled -discussed that if there is concern for plenty of sludging of the blood in her hands or feet, this may be an indication to adjust hydroxyurea dose to some extent.  -lower extremity pain is likely locally muscular possibly involving strained hamstring -to address positional posterior leg pain, would recommend applying warm compresses, limiting straining in the area, immobilizing area with KT tape, or using sports wraps to stabilize muscles -possible that her occasional purple discoloration in toes may be triggered by colder weather. Recommend patient to keep her feet warm such as with heated socks -answered all of patient's and her brother-in-law's questions in detail -will continue to monitor with labs in a couple of months  FOLLOW-UP: RTC with Dr Candise Che with labs in 2 months IV Venofer 300mg  weekly x 3 doses  The total time spent in the appointment was 30 minutes* .  All of the patient's questions were answered with apparent satisfaction. The patient knows to call the clinic with any problems, questions or concerns.   Wyvonnia Lora MD MS AAHIVMS [facility] Hematology/Oncology Physician [facility]  .*Total Encounter Time as defined by the Centers for Medicare and Medicaid Services includes, in addition to the face-to-face time of a patient visit (documented in the note above) non-face-to-face time: obtaining and reviewing outside history, ordering and reviewing medications, tests or procedures, care coordination (communications with other health care professionals or caregivers) and documentation in the medical record.    I,Mitra Faeizi,acting as a Neurosurgeon for Wyvonnia Lora, MD.,have documented all relevant documentation on the behalf of Wyvonnia Lora, MD,as directed by  Wyvonnia Lora, MD while in the presence of Wyvonnia Lora, MD.  .I have reviewed the above documentation for accuracy and completeness, and I agree with the above. Johney Maine  MD

## 2023-05-29 ENCOUNTER — Encounter: Payer: Self-pay | Admitting: Hematology

## 2023-05-30 ENCOUNTER — Telehealth: Payer: Self-pay | Admitting: Pharmacy Technician

## 2023-05-30 NOTE — Telephone Encounter (Signed)
Beth,  Patient will be scheduled as soon as possible.  Auth Submission: NO AUTH NEEDED Site of care: Site of care: [facility] Payer: [employer] - SUREST Medication & CPT/J Code(s) submitted: Venofer (Iron Sucrose) [id number] Route of submission (phone, fax, portal):  Phone # Fax # Auth type: Buy/Bill PB Units/visits requested: 3 Reference number:  Approval from: 05/30/23 to 07/11/23

## 2023-05-31 ENCOUNTER — Emergency Department (HOSPITAL_BASED_OUTPATIENT_CLINIC_OR_DEPARTMENT_OTHER): Payer: No Typology Code available for payment source

## 2023-05-31 ENCOUNTER — Encounter (HOSPITAL_BASED_OUTPATIENT_CLINIC_OR_DEPARTMENT_OTHER): Payer: Self-pay

## 2023-05-31 ENCOUNTER — Emergency Department (HOSPITAL_BASED_OUTPATIENT_CLINIC_OR_DEPARTMENT_OTHER)
Admission: EM | Admit: 2023-05-31 | Discharge: 2023-05-31 | Disposition: A | Discharge status: Home / Self Care | Payer: No Typology Code available for payment source | Attending: Emergency Medicine | Admitting: Emergency Medicine

## 2023-05-31 DIAGNOSIS — M79662 Pain in left lower leg: Secondary | ICD-10-CM | POA: Insufficient documentation

## 2023-05-31 DIAGNOSIS — Z79899 Other long term (current) drug therapy: Secondary | ICD-10-CM | POA: Diagnosis not present

## 2023-05-31 DIAGNOSIS — Z7982 Long term (current) use of aspirin: Secondary | ICD-10-CM | POA: Diagnosis not present

## 2023-05-31 DIAGNOSIS — M79605 Pain in left leg: Secondary | ICD-10-CM

## 2023-05-31 LAB — CBC WITH DIFFERENTIAL/PLATELET
Abs Immature Granulocytes: 0 10*3/uL (ref 0.00–0.07)
Basophils Absolute: 0.3 10*3/uL — ABNORMAL HIGH (ref 0.0–0.1)
Basophils Relative: 4 %
Eosinophils Absolute: 0.3 10*3/uL (ref 0.0–0.5)
Eosinophils Relative: 4 %
HCT: 35.7 % — ABNORMAL LOW (ref 36.0–46.0)
Hemoglobin: 10.5 g/dL — ABNORMAL LOW (ref 12.0–15.0)
Lymphocytes Relative: 19 %
Lymphs Abs: 1.5 10*3/uL (ref 0.7–4.0)
MCH: 22.9 pg — ABNORMAL LOW (ref 26.0–34.0)
MCHC: 29.4 g/dL — ABNORMAL LOW (ref 30.0–36.0)
MCV: 77.9 fL — ABNORMAL LOW (ref 80.0–100.0)
Monocytes Absolute: 0.9 10*3/uL (ref 0.1–1.0)
Monocytes Relative: 11 %
Neutro Abs: 5 10*3/uL (ref 1.7–7.7)
Neutrophils Relative %: 62 %
Platelets: 1161 10*3/uL (ref 150–400)
RBC: 4.58 MIL/uL (ref 3.87–5.11)
RDW: 25.8 % — ABNORMAL HIGH (ref 11.5–15.5)
Smear Review: INCREASED
WBC: 8.1 10*3/uL (ref 4.0–10.5)
nRBC: 0.2 % (ref 0.0–0.2)

## 2023-05-31 LAB — BASIC METABOLIC PANEL
Anion gap: 8 (ref 5–15)
BUN: 12 mg/dL (ref 6–20)
CO2: 26 mmol/L (ref 22–32)
Calcium: 9.9 mg/dL (ref 8.9–10.3)
Chloride: 103 mmol/L (ref 98–111)
Creatinine, Ser: 0.82 mg/dL (ref 0.44–1.00)
GFR, Estimated: >60 mL/min (ref >60)
Glucose, Bld: 99 mg/dL (ref 70–99)
Potassium: 4.2 mmol/L (ref 3.5–5.1)
Sodium: 137 mmol/L (ref 135–145)

## 2023-05-31 LAB — CK: Total CK: 55 U/L (ref 38–234)

## 2023-05-31 MED ORDER — GABAPENTIN 300 MG PO CAPS
300.0000 mg | ORAL_CAPSULE | Freq: Once | ORAL | Status: AC
  Administered 2023-05-31: 300 mg via ORAL
  Filled 2023-05-31: qty 1

## 2023-05-31 MED ORDER — LIDOCAINE 5 % EX PTCH
1.0000 | MEDICATED_PATCH | CUTANEOUS | Status: DC
  Administered 2023-05-31: 1 via TRANSDERMAL
  Filled 2023-05-31: qty 1

## 2023-05-31 MED ORDER — LIDOCAINE 5 % EX PTCH: 1.0000 | MEDICATED_PATCH | CUTANEOUS | 0 refills | Status: DC

## 2023-05-31 MED ORDER — GABAPENTIN 100 MG PO CAPS: 100.0000 mg | ORAL_CAPSULE | Freq: Three times a day (TID) | ORAL | 0 refills | Status: DC

## 2023-05-31 NOTE — ED Provider Notes (Signed)
[place] EMERGENCY DEPARTMENT AT [facility] Provider Note   CSN: [id number] Arrival date & time: 05/31/23  1359     History  No chief complaint on file.   Eileen [name] is a 52 y.o. female.  With a history of polycythemia vera and migraine headaches who presents to the ED for left lower extremity pain.  For about 2 months she has experienced ongoing left lower extremity pain.  This pain is most pronounced over the left hamstring.  She has been evaluated by her PCP and chiropractor.  DVT study 1 month ago showed no evidence of DVT.  Compliant with her Eliquis.  Chiropractor adjustments have not helped much.  Over the last 2 days she has had severe pain in her left calf which is new and has made it difficult to weight-bear or walk.  No injuries or recent falls.  No other complaints at this time  HPI     Home Medications Prior to Admission medications   Medication Sig Start Date End Date Taking? Authorizing Provider  gabapentin (NEURONTIN) 100 MG capsule Take 1 capsule (100 mg total) by mouth 3 (three) times daily. 05/31/23  Yes Estelle June A, DO  lidocaine (LIDODERM) 5 % Place 1 patch onto the skin daily. Remove & Discard patch within 12 hours or as directed by MD 05/31/23  Yes Royanne Foots, DO  acetaminophen (TYLENOL) 500 MG tablet Take 1,000 mg by mouth every 6 (six) hours as needed for moderate pain or mild pain.    [provider]  aspirin EC 81 MG tablet Take 1 tablet (81 mg total) by mouth daily. Swallow whole. 09/30/22   Johney Maine, MD  cyclobenzaprine (FLEXERIL) 10 MG tablet Take 10 mg by mouth 3 (three) times daily as needed. 08/09/22   [provider]  ELIQUIS 5 MG TABS tablet Take 1 tablet by mouth twice daily 05/21/23   Johney Maine, MD  hydroxyurea (HYDREA) 500 MG capsule Take 2 capsules (1,000 mg total) by mouth daily. May take with food to minimize GI side effects. 02/25/23   Johney Maine, MD  ketorolac  (TORADOL) 10 MG tablet Take 1 tablet (10 mg total) by mouth every 6 (six) hours as needed. 04/25/23   Cuthriell, Delorise Royals, PA-C  methylPREDNISolone (MEDROL DOSEPAK) 4 MG TBPK tablet Take 6 on day 1, 5 on day 2, 4 on day 3, 3 on day 4, 2 on day 5, 1 on day 6 04/25/23   Cuthriell, Delorise Royals, PA-C  omeprazole (PRILOSEC OTC) 20 MG tablet Take 20 mg by mouth daily as needed (acid reflux).    [provider]  ondansetron (ZOFRAN) 8 MG tablet Take 8 mg by mouth every 8 (eight) hours as needed for nausea or vomiting. 02/27/22   [provider]  oxyCODONE 10 MG TABS Take 1 tablet (10 mg total) by mouth every 6 (six) hours as needed for moderate pain (not releieved by tylenol). 04/05/22   Adam Phenix, PA-C  propranolol (INDERAL) 20 MG tablet Take 1 tablet by mouth twice daily 05/22/23   Vaslow, Georgeanna Lea, MD      Allergies    Patient has no known allergies.    Review of Systems   Review of Systems  Physical Exam Updated Vital Signs BP 128/73   Pulse 77   Temp 98.1 F (36.7 C)   Resp 16   SpO2 98%  Physical Exam Vitals and nursing note reviewed.  HENT:  Head: Normocephalic and atraumatic.  Eyes:     Pupils: Pupils are equal, round, and reactive to light.  Cardiovascular:     Rate and Rhythm: Normal rate and regular rhythm.  Pulmonary:     Effort: Pulmonary effort is normal.     Breath sounds: Normal breath sounds.  Abdominal:     Palpations: Abdomen is soft.     Tenderness: There is no abdominal tenderness.  Musculoskeletal:     Comments: Pain with deep palpation of left gastrocnemius and left distal hamstring No tenderness over left buttock or left hip Full active range of motion of left lower extremity 5 out of 5 motor strength bilateral lower extremities Sensation intact to light touch throughout DP pulses intact bilaterally  Skin:    General: Skin is warm and dry.  Neurological:     Mental Status: She is alert.  Psychiatric:        Mood and  Affect: Mood normal.     ED Results / Procedures / Treatments   Labs (all labs ordered are listed, but only abnormal results are displayed) Labs Reviewed  CBC WITH DIFFERENTIAL/PLATELET - Abnormal; Notable for the following components:      Result Value   Hemoglobin 10.5 (*)    HCT 35.7 (*)    MCV 77.9 (*)    MCH 22.9 (*)    MCHC 29.4 (*)    RDW 25.8 (*)    Platelets 1,161 (*)    Basophils Absolute 0.3 (*)    All other components within normal limits  CK  BASIC METABOLIC PANEL    EKG None  Radiology US Venous Img Lower Unilateral Left Result Date: 05/31/2023 CLINICAL DATA:  Left calf pain. Continued left posterior thigh pain now radiating to the posterior knee and calf region for 2 months. Pain is worse over the last 2 days. Difficult weight-bearing. EXAM: Left LOWER EXTREMITY VENOUS DOPPLER ULTRASOUND TECHNIQUE: Gray-scale sonography with compression, as well as color and duplex ultrasound, were performed to evaluate the deep venous system(s) from the level of the common femoral vein through the popliteal and proximal calf veins. COMPARISON:  04/25/2023 FINDINGS: VENOUS Normal compressibility of the common femoral, superficial femoral, and popliteal veins, as well as the visualized calf veins. Visualized portions of profunda femoral vein and great saphenous vein unremarkable. No filling defects to suggest DVT on grayscale or color Doppler imaging. Doppler waveforms show normal direction of venous flow, normal respiratory plasticity and response to augmentation. Limited views of the contralateral common femoral vein are unremarkable. OTHER None. Limitations: none IMPRESSION: No evidence of acute deep venous thrombosis in the visualized lower extremity veins. Electronically Signed   By: Burman Nieves M.D.   On: 05/31/2023 17:13    Procedures Procedures    Medications Ordered in ED Medications  lidocaine (LIDODERM) 5 % 1 patch (1 patch Transdermal Patch Applied 05/31/23 1600)   gabapentin (NEURONTIN) capsule 300 mg (300 mg Oral Given 05/31/23 1600)    ED Course/ Medical Decision Making/ A&P Clinical Course as of 05/31/23 1902  Sat May 31, 2023  1900 No DVT.  No evidence of rhabdo.  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  No significant improvement in her discomfort but maybe the lidocaine patch and gabapentin help she is unsure.  Will give her a prescription for these and instruct for PCP follow-up.  Will provide her with a number for orthopedic follow-up should her symptoms persist [MP]    Clinical Course User Index [MP] Royanne Foots, DO  Medical Decision Making 52 year old female with history as above presenting for acute worsening of subacute left lower extremity pain.  2 months of left hamstring pain for which she is seeing a chiropractor and her PCP.  DVT study from a month ago was negative.  Now with 2 days of severe left calf pain.  Compliant with her Eliquis for polycythemia vera.  Significant calf tenderness and hamstring tenderness.  Low suspicion for DVT given that she is on Eliquis but this need to be ruled out with ultrasound since the left calf pain is new.  Suspect this is most likely musculoskeletal in etiology or related to sciatica.  Tylenol, NSAIDs and oxycodone provided minimal relief at home.  Will take multimodal approach and try gabapentin and lidocaine patch to see if that helps.  Low suspicion for rhabdomyolysis as cause for her pain but will check a CK and basic labs here  Amount and/or Complexity of Data Reviewed Labs: ordered.  Risk Prescription drug management.           Final Clinical Impression(s) / ED Diagnoses Final diagnoses:  Pain of left lower extremity    Rx / DC Orders ED Discharge Orders          Ordered    gabapentin (NEURONTIN) 100 MG capsule  3 times daily        05/31/23 1902    lidocaine (LIDODERM) 5 %  Every 24 hours        05/31/23 1902               Royanne Foots, DO 05/31/23 1902

## 2023-05-31 NOTE — ED Notes (Signed)
Pt verbalized understanding of d/c instructions and follow up care. Pt wheeled out of ED via wheelchair, NAD.

## 2023-05-31 NOTE — ED Triage Notes (Signed)
Pt c/o L hamstring/leg pain that has radiated down to posterior knee/ calf. Seen at [facility] for same approx 37mo ago to r/o DVT. States pain is "exactly the same, just more intense." Has been advised by chiropractor, PCP & ED that "it's r/t hamstring," but states she is "not convinced."

## 2023-05-31 NOTE — Discharge Instructions (Signed)
You were seen in the [place] apartment again for left lower extremity pain There was no blood clots today We are not certain what is causing this pain Gabapentin and lidocaine prescriptions have been called into your pharmacy Begin taking as directed and see if this helps with the pain Follow-up with your primary care doctor You can also follow-up with an orthopedic specialist to discuss further management if your symptoms persist or worsen

## 2023-06-10 ENCOUNTER — Encounter: Payer: Self-pay | Admitting: Hematology

## 2023-06-10 ENCOUNTER — Other Ambulatory Visit: Payer: Self-pay

## 2023-06-18 ENCOUNTER — Other Ambulatory Visit: Payer: Self-pay | Admitting: Hematology

## 2023-06-18 ENCOUNTER — Other Ambulatory Visit: Payer: Self-pay | Admitting: Internal Medicine

## 2023-06-18 DIAGNOSIS — D45 Polycythemia vera: Secondary | ICD-10-CM

## 2023-06-19 ENCOUNTER — Encounter: Payer: Self-pay | Admitting: Hematology

## 2023-06-20 ENCOUNTER — Ambulatory Visit (INDEPENDENT_AMBULATORY_CARE_PROVIDER_SITE_OTHER): Payer: No Typology Code available for payment source

## 2023-06-20 VITALS — BP 88/59 | HR 80 | Temp 97.8°F | Resp 16 | Ht 63.0 in | Wt 221.2 lb

## 2023-06-20 DIAGNOSIS — D509 Iron deficiency anemia, unspecified: Secondary | ICD-10-CM | POA: Diagnosis not present

## 2023-06-20 DIAGNOSIS — D500 Iron deficiency anemia secondary to blood loss (chronic): Secondary | ICD-10-CM

## 2023-06-20 MED ORDER — DIPHENHYDRAMINE HCL 25 MG PO CAPS
25.0000 mg | ORAL_CAPSULE | Freq: Once | ORAL | Status: AC
  Administered 2023-06-20: 25 mg via ORAL
  Filled 2023-06-20: qty 1

## 2023-06-20 MED ORDER — ACETAMINOPHEN 325 MG PO TABS
650.0000 mg | ORAL_TABLET | Freq: Once | ORAL | Status: AC
  Administered 2023-06-20: 650 mg via ORAL
  Filled 2023-06-20: qty 2

## 2023-06-20 MED ORDER — IRON SUCROSE 300 MG IVPB - SIMPLE MED
300.0000 mg | Status: DC
Start: 2023-06-20 — End: 2023-06-20
  Administered 2023-06-20: 300 mg via INTRAVENOUS
  Filled 2023-06-20: qty 265

## 2023-06-20 NOTE — Progress Notes (Signed)
 Diagnosis: Iron  Deficiency Anemia  Provider:  Praveen Mannam MD  Procedure: IV Infusion  IV Type: Peripheral, IV Location: R Antecubital  Venofer  (Iron  Sucrose), Dose: 300 mg  Infusion Start Time: 0939  Infusion Stop Time: 1118  Post Infusion IV Care: Observation period completed and Peripheral IV Discontinued  Discharge: Condition: Good, Destination: Home . AVS Declined  Performed by:  Leita FORBES Miles, LPN

## 2023-06-27 ENCOUNTER — Ambulatory Visit: Payer: No Typology Code available for payment source | Admitting: *Deleted

## 2023-06-27 VITALS — BP 95/64 | HR 79 | Temp 97.9°F | Resp 16 | Ht 63.0 in | Wt 219.6 lb

## 2023-06-27 DIAGNOSIS — D509 Iron deficiency anemia, unspecified: Secondary | ICD-10-CM

## 2023-06-27 DIAGNOSIS — D500 Iron deficiency anemia secondary to blood loss (chronic): Secondary | ICD-10-CM

## 2023-06-27 MED ORDER — DIPHENHYDRAMINE HCL 25 MG PO CAPS
25.0000 mg | ORAL_CAPSULE | Freq: Once | ORAL | Status: AC
  Administered 2023-06-27: 25 mg via ORAL
  Filled 2023-06-27: qty 1

## 2023-06-27 MED ORDER — SODIUM CHLORIDE 0.9 % IV SOLN
300.0000 mg | Freq: Once | INTRAVENOUS | Status: AC
  Administered 2023-06-27: 300 mg via INTRAVENOUS
  Filled 2023-06-27: qty 15

## 2023-06-27 MED ORDER — IRON SUCROSE 300 MG IVPB - SIMPLE MED: 300.0000 mg | Status: DC

## 2023-06-27 MED ORDER — ACETAMINOPHEN 325 MG PO TABS
650.0000 mg | ORAL_TABLET | Freq: Once | ORAL | Status: AC
Start: 2023-06-27 — End: 2023-06-27
  Administered 2023-06-27: 650 mg via ORAL
  Filled 2023-06-27: qty 2

## 2023-06-27 NOTE — Progress Notes (Signed)
Diagnosis: Iron Deficiency Anemia  Provider:  Chilton Greathouse MD  Procedure: IV Infusion  IV Type: Peripheral, IV Location: L Forearm  Venofer (Iron Sucrose), Dose: 300 mg  Infusion Start Time: 0942 am  Infusion Stop Time: 1143 am  Post Infusion IV Care: Observation period completed and Peripheral IV Discontinued  Discharge: Condition: Good, Destination: Home . AVS Declined  Performed by:  Forrest Moron, RN

## 2023-07-11 ENCOUNTER — Ambulatory Visit: Payer: No Typology Code available for payment source | Admitting: *Deleted

## 2023-07-11 VITALS — BP 101/68 | HR 74 | Temp 97.8°F | Resp 16 | Ht 63.0 in | Wt 212.8 lb

## 2023-07-11 DIAGNOSIS — D509 Iron deficiency anemia, unspecified: Secondary | ICD-10-CM | POA: Diagnosis not present

## 2023-07-11 DIAGNOSIS — D500 Iron deficiency anemia secondary to blood loss (chronic): Secondary | ICD-10-CM

## 2023-07-11 MED ORDER — ACETAMINOPHEN 325 MG PO TABS
650.0000 mg | ORAL_TABLET | Freq: Once | ORAL | Status: AC
  Administered 2023-07-11: 650 mg via ORAL
  Filled 2023-07-11: qty 2

## 2023-07-11 MED ORDER — DIPHENHYDRAMINE HCL 25 MG PO CAPS
25.0000 mg | ORAL_CAPSULE | Freq: Once | ORAL | Status: AC
  Administered 2023-07-11: 25 mg via ORAL
  Filled 2023-07-11: qty 1

## 2023-07-11 MED ORDER — IRON SUCROSE 20 MG/ML IV SOLN
300.0000 mg | Freq: Once | INTRAVENOUS | Status: AC
  Administered 2023-07-11: 300 mg via INTRAVENOUS
  Filled 2023-07-11: qty 15

## 2023-07-11 MED ORDER — IRON SUCROSE 300 MG IVPB - SIMPLE MED
300.0000 mg | Status: DC
Start: 2023-07-11 — End: 2023-07-11

## 2023-07-11 NOTE — Progress Notes (Signed)
Diagnosis: Iron Deficiency Anemia  Provider:  Chilton Greathouse MD  Procedure: IV Infusion  IV Type: Peripheral, IV Location: R Antecubital  Venofer (Iron Sucrose), Dose: 300 mg  Infusion Start Time: 0950 am  Infusion Stop Time: 1141 am  Post Infusion IV Care: Patient declined observation and Peripheral IV Discontinued  Discharge: Condition: Good, Destination: Home . AVS Declined  Performed by:  Forrest Moron, RN

## 2023-07-18 ENCOUNTER — Other Ambulatory Visit: Payer: Self-pay | Admitting: Hematology

## 2023-07-18 ENCOUNTER — Other Ambulatory Visit: Payer: Self-pay | Admitting: Internal Medicine

## 2023-07-18 DIAGNOSIS — D45 Polycythemia vera: Secondary | ICD-10-CM

## 2023-07-28 ENCOUNTER — Other Ambulatory Visit: Payer: Self-pay

## 2023-07-28 DIAGNOSIS — D45 Polycythemia vera: Secondary | ICD-10-CM

## 2023-07-28 DIAGNOSIS — D509 Iron deficiency anemia, unspecified: Secondary | ICD-10-CM

## 2023-07-29 ENCOUNTER — Ambulatory Visit: Payer: No Typology Code available for payment source | Admitting: Hematology

## 2023-07-29 ENCOUNTER — Other Ambulatory Visit: Payer: No Typology Code available for payment source

## 2023-08-15 ENCOUNTER — Inpatient Hospital Stay (HOSPITAL_BASED_OUTPATIENT_CLINIC_OR_DEPARTMENT_OTHER): Payer: No Typology Code available for payment source | Admitting: Hematology

## 2023-08-15 ENCOUNTER — Inpatient Hospital Stay: Payer: No Typology Code available for payment source | Attending: Oncology

## 2023-08-15 VITALS — BP 125/78 | HR 86 | Temp 97.5°F | Resp 17 | Ht 63.0 in | Wt 208.0 lb

## 2023-08-15 DIAGNOSIS — D45 Polycythemia vera: Secondary | ICD-10-CM | POA: Insufficient documentation

## 2023-08-15 DIAGNOSIS — Z79899 Other long term (current) drug therapy: Secondary | ICD-10-CM | POA: Diagnosis not present

## 2023-08-15 DIAGNOSIS — Z86718 Personal history of other venous thrombosis and embolism: Secondary | ICD-10-CM | POA: Insufficient documentation

## 2023-08-15 DIAGNOSIS — Z7901 Long term (current) use of anticoagulants: Secondary | ICD-10-CM | POA: Diagnosis not present

## 2023-08-15 DIAGNOSIS — Z803 Family history of malignant neoplasm of breast: Secondary | ICD-10-CM | POA: Diagnosis not present

## 2023-08-15 DIAGNOSIS — Z9081 Acquired absence of spleen: Secondary | ICD-10-CM | POA: Diagnosis not present

## 2023-08-15 DIAGNOSIS — D509 Iron deficiency anemia, unspecified: Secondary | ICD-10-CM

## 2023-08-15 LAB — CBC WITH DIFFERENTIAL (CANCER CENTER ONLY)
Abs Immature Granulocytes: 0.07 10*3/uL (ref 0.00–0.07)
Basophils Absolute: 0.5 10*3/uL — ABNORMAL HIGH (ref 0.0–0.1)
Basophils Relative: 4 %
Eosinophils Absolute: 1.3 10*3/uL — ABNORMAL HIGH (ref 0.0–0.5)
Eosinophils Relative: 10 %
HCT: 43.4 % (ref 36.0–46.0)
Hemoglobin: 13.7 g/dL (ref 12.0–15.0)
Immature Granulocytes: 1 %
Lymphocytes Relative: 25 %
Lymphs Abs: 3.4 10*3/uL (ref 0.7–4.0)
MCH: 30.9 pg (ref 26.0–34.0)
MCHC: 31.6 g/dL (ref 30.0–36.0)
MCV: 97.7 fL (ref 80.0–100.0)
Monocytes Absolute: 1.4 10*3/uL — ABNORMAL HIGH (ref 0.1–1.0)
Monocytes Relative: 10 %
Neutro Abs: 7.1 10*3/uL (ref 1.7–7.7)
Neutrophils Relative %: 50 %
Platelet Count: 1540 10*3/uL (ref 150–400)
RBC: 4.44 MIL/uL (ref 3.87–5.11)
RDW: 25.6 % — ABNORMAL HIGH (ref 11.5–15.5)
WBC Count: 13.8 10*3/uL — ABNORMAL HIGH (ref 4.0–10.5)
nRBC: 0.1 % (ref 0.0–0.2)

## 2023-08-15 LAB — RETICULOCYTES
Immature Retic Fract: 23.5 % — ABNORMAL HIGH (ref 2.3–15.9)
RBC.: 4.43 MIL/uL (ref 3.87–5.11)
Retic Count, Absolute: 95.2 10*3/uL (ref 19.0–186.0)
Retic Ct Pct: 2.2 % (ref 0.4–3.1)

## 2023-08-15 LAB — CMP (CANCER CENTER ONLY)
ALT: 30 U/L (ref 0–44)
AST: 27 U/L (ref 15–41)
Albumin: 4.7 g/dL (ref 3.5–5.0)
Alkaline Phosphatase: 148 U/L — ABNORMAL HIGH (ref 38–126)
Anion gap: 7 (ref 5–15)
BUN: 11 mg/dL (ref 6–20)
CO2: 26 mmol/L (ref 22–32)
Calcium: 9.8 mg/dL (ref 8.9–10.3)
Chloride: 105 mmol/L (ref 98–111)
Creatinine: 0.82 mg/dL (ref 0.44–1.00)
GFR, Estimated: >60 mL/min (ref >60)
Glucose, Bld: 100 mg/dL — ABNORMAL HIGH (ref 70–99)
Potassium: 4.1 mmol/L (ref 3.5–5.1)
Sodium: 138 mmol/L (ref 135–145)
Total Bilirubin: 0.3 mg/dL (ref 0.0–1.2)
Total Protein: 8 g/dL (ref 6.5–8.1)

## 2023-08-15 LAB — FERRITIN: Ferritin: 31 ng/mL (ref 11–307)

## 2023-08-15 LAB — LACTATE DEHYDROGENASE: LDH: 282 U/L — ABNORMAL HIGH (ref 98–192)

## 2023-08-15 MED ORDER — HYDROXYUREA 500 MG PO CAPS: 1500.0000 mg | ORAL_CAPSULE | Freq: Every day | ORAL | 2 refills | Status: DC

## 2023-08-15 NOTE — Progress Notes (Signed)
 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 female who has been a previous patient of Dr. 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  She was last seen by Dr. Clelia Croft on 06/21/2022 and complained of frequent vascular headaches, but was doing otherwise well overall.  Today, she is accompanied by her brother.  She confirms that her platelets have been high recently. She reports that when she was first diagnosed she was only getting phlebotomies and had continued this for 7 years. However, at the beginning of last year she needed consideration for additional treatment with [facility] for increase in her platelet counts and significant splenomegaly..  She reports that her spleen had been enlarged and she had not been symptomatic upfront. 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 The pain and spleen size limited her consumption of food. Patent reports that she was anemic prior to her splenectomy, though she is unsure of the cause.   She was initially on hydroxyurea for a short time, about a few months max, and she believes it was not suppressing her platelets. She was switched to [employer] year 5 MG twice a day, which did improve her platelets. She has been on Eliquis since November, 2024. Since then, her platelets have been very elevated. She has not previously been on any blood thinners or Aspirin.  Patient reports that Jakafi did not shrink her spleen. She discontinued it as she was told it may have a tendency to cause clots. She was then restarted on the original dose once a day after her splenectomy.  Patient was previously given narcotics by her PCP for spleen issues, which cause her to fall asleep.   She never had a bone marrow biopsy and patient's last phlebotomy was May 2022. She reports that she previously received oral iron which Irritated her ulcer. She denies any bleeding concerns.  She complains of severe headaches that began 6 months ago. She does report a Fhx of migraines. Her PCP has not ordered any scans previously to further evaluate. Patient is not on any preventative medication for her migraines.   She presented to the ED on 07/24/22 and complained of chest pain, breathing issues, pain in left arm. She describes that she felt like she was having a heart attack. To control this pain, patient regularly takes an exceedingly high dose of Tylenol 400-500 MG 5-6 times a day. She has been taking this Tylenol dose at least since September, 2023. She last took 4 tablets of Tylenol this morning. Patient has not had any chest pain since her ED visit.   She denies any change in vision, speech, or hearing, and no new weakness in her hands or feet. No chemical exposure or leg swelling. Patient reports that she only drinks 1-2 glasses of water a day.  She does complain of back pain which began October/November 2023. She presented to the chiropractor which had improved symptoms. She has a sedentary work lifestyle. She does take muscle relaxers to improve her back pain. Patient also complains of sudden muscle spasms that cause excruciating body pain.  INTERVAL HISTORY:   Eileen [name] is a wonderful 53 y.o. female who  is here today for continued evaluation and management of her JAK2 positive polycythemia vera.  Patient was last seen by me on 05/23/2023 and complained of positional pain in her left posterior leg with intermittent swelling. She reported that she was told that it was possible that she may have been in the beginning stages of a blood clot in the lower extremity. She also complained  of some tightness in her back, fatigue, low energy, and occasional purple discoloration in toes.   She presented to the ED on 05/31/2023 for pain of the left lower extremity.   Today, she is accompanied by her brother-in-law. Patient reports that she has been doing fairly well since since her last clinical visit. Patient did receive IV iron on three occasions since her last visit.   She is currently taking 3 tablets of hydroxyurea for three days of the week and two tablets on all other days of the week. Patient has been tolerating hydroxyurea with no new or severe toxicities.   She denies any chest pain, SOB, abdominal pain, leg swelling,  calf pains, new skin rashes, mouth sores, abdominal pain, diarrhea, infection issues, or other new symptoms. Patient denies starting any other new medications.   She reports no major issues in regards to discoloration in her fingers and toes.   Patient continues to take Eliquis and Aspirin.   She reports that she is taking Tylenol as needed.   She continues to stay well-hydrated.   MEDICAL HISTORY:  Past Medical History:  Diagnosis Date   Acute duodenal ulcer without mention of hemorrhage, perforation, or obstruction    Anemia    due to the polycythemia   COVID 2022   mild case   Dysrhythmia    History of palpations, for many years   GERD (gastroesophageal reflux disease)    Headache    takes Advil   Polycythemia vera (HCC)    Reflux esophagitis     SURGICAL HISTORY: Past Surgical History:  Procedure Laterality Date   ABDOMINAL HYSTERECTOMY  2011ish   BIOPSY  07/11/2020   Procedure: BIOPSY;  Surgeon: Napoleon Form, MD;  Location: [facility];  Service: Endoscopy;;   CESAREAN SECTION     x 3    CHOLECYSTECTOMY     ESOPHAGOGASTRODUODENOSCOPY (EGD) WITH PROPOFOL N/A 07/11/2020   Procedure: ESOPHAGOGASTRODUODENOSCOPY (EGD) WITH PROPOFOL;  Surgeon: Napoleon Form, MD;  Location: [facility];  Service: Endoscopy;  Laterality:  N/A;   IR BONE MARROW BIOPSY & ASPIRATION  08/12/2022   ORIF ANKLE FRACTURE Left 12/21/2014   Procedure: OPEN REDUCTION INTERNAL FIXATION (ORIF) LEFT LATERAL MALLEOLUS ANKLE FRACTURE;  Surgeon: Tarry Kos, MD;  Location: [facility];  Service: Orthopedics;  Laterality: Left;  Needs RNFA   SPLENECTOMY, TOTAL N/A 04/02/2022   Procedure: OPEN SPLENECTOMY;  Surgeon: Harriette Bouillon, MD;  Location: [facility] OR;  Service: General;  Laterality: N/A;  TAP BLOCK   UPPER GI ENDOSCOPY      SOCIAL HISTORY: Social History   Socioeconomic History   Marital status: Married    Spouse name: Not on file   Number of children: 3   Years of education: Not on file   Highest education level: Not on file  Occupational History   Occupation: Copy ANALYST    Employer: [employer]  Tobacco Use   Smoking status: Never   Smokeless tobacco: Never  Vaping Use   Vaping status: Never Used  Substance and Sexual Activity   Alcohol use: No   Drug use: No  Sexual activity: Yes    Birth control/protection: Surgical  Other Topics Concern   Not on file  Social History Narrative   No caffeine    Right handed   Lives at home with her husband and daughter   Social Drivers of Health   Financial Resource Strain: Not on file  Food Insecurity: No Food Insecurity (02/28/2022)   Hunger Vital Sign    Worried About Running Out of Food in the Last Year: Never true    Ran Out of Food in the Last Year: Never true  Transportation Needs: No Transportation Needs (02/28/2022)   PRAPARE - Administrator, Civil Service (Medical): No    Lack of Transportation (Non-Medical): No  Physical Activity: Not on file  Stress: Not on file  Social Connections: Unknown (10/07/2021)   [facility]   Social Network    Social Network: Not on file  Intimate Partner Violence: Not At Risk (02/28/2022)   Humiliation, Afraid, Rape, and Kick questionnaire    Fear of Current or Ex-Partner: No    Emotionally  Abused: No    Physically Abused: No    Sexually Abused: No    FAMILY HISTORY: Family History  Problem Relation Age of Onset   Migraines Mother    Breast cancer Other        Great MA    Colon cancer Neg Hx     ALLERGIES:  has no known allergies.  MEDICATIONS:  Current Outpatient Medications  Medication Sig Dispense Refill   acetaminophen (TYLENOL) 500 MG tablet Take 1,000 mg by mouth every 6 (six) hours as needed for moderate pain or mild pain.     aspirin EC 81 MG tablet Take 1 tablet (81 mg total) by mouth daily. Swallow whole. 30 tablet 12   cyclobenzaprine (FLEXERIL) 10 MG tablet Take 10 mg by mouth 3 (three) times daily as needed.     ELIQUIS 5 MG TABS tablet Take 1 tablet by mouth twice daily 60 tablet 0   gabapentin (NEURONTIN) 100 MG capsule Take 1 capsule (100 mg total) by mouth 3 (three) times daily. 90 capsule 0   hydroxyurea (HYDREA) 500 MG capsule Take 2 capsules (1,000 mg total) by mouth daily. May take with food to minimize GI side effects. 60 capsule 2   ketorolac (TORADOL) 10 MG tablet Take 1 tablet (10 mg total) by mouth every 6 (six) hours as needed. 20 tablet 0   lidocaine (LIDODERM) 5 % Place 1 patch onto the skin daily. Remove & Discard patch within 12 hours or as directed by MD 30 patch 0   methylPREDNISolone (MEDROL DOSEPAK) 4 MG TBPK tablet Take 6 on day 1, 5 on day 2, 4 on day 3, 3 on day 4, 2 on day 5, 1 on day 6 21 tablet 0   omeprazole (PRILOSEC OTC) 20 MG tablet Take 20 mg by mouth daily as needed (acid reflux).     ondansetron (ZOFRAN) 8 MG tablet Take 8 mg by mouth every 8 (eight) hours as needed for nausea or vomiting.     oxyCODONE 10 MG TABS Take 1 tablet (10 mg total) by mouth every 6 (six) hours as needed for moderate pain (not releieved by tylenol). 25 tablet 0   propranolol (INDERAL) 20 MG tablet Take 1 tablet by mouth twice daily 60 tablet 0   No current facility-administered medications for this visit.   Facility-Administered Medications  Ordered in Other Visits  Medication Dose Route Frequency  Provider Last Rate Last Admin   gadopentetate dimeglumine (MAGNEVIST) injection 18 mL  18 mL Intravenous Once PRN Anson Fret, MD        REVIEW OF SYSTEMS:    10 Point review of Systems was done is negative except as noted above.   PHYSICAL EXAMINATION:  .BP 125/78 (BP Location: Left Arm, Patient Position: Sitting)   Pulse 86   Temp (!) 97.5 F (36.4 C) (Temporal)   Resp 17   Ht 5\' 3"  (1.6 m)   Wt 208 lb (94.3 kg)   SpO2 100%   BMI 36.85 kg/m   GENERAL:alert, in no acute distress and comfortable SKIN: no acute rashes, no significant lesions EYES: conjunctiva are pink and non-injected, sclera anicteric OROPHARYNX: MMM, no exudates, no oropharyngeal erythema or ulceration NECK: supple, no JVD LYMPH:  no palpable lymphadenopathy in the cervical, axillary or inguinal regions LUNGS: clear to auscultation b/l with normal respiratory effort HEART: regular rate & rhythm ABDOMEN:  normoactive bowel sounds , non tender, not distended. Extremity: no pedal edema PSYCH: alert & oriented x 3 with fluent speech NEURO: no focal motor/sensory deficits   LABORATORY DATA:  I have reviewed the data as listed .    Latest Ref Rng & Units 08/15/2023    2:19 PM 05/31/2023    3:59 PM 05/23/2023    1:59 PM  CBC  WBC 4.0 - 10.5 K/uL 13.8  8.1  9.4   Hemoglobin 12.0 - 15.0 g/dL 16.1  09.6  04.5   Hematocrit 36.0 - 46.0 % 43.4  35.7  34.4   Platelets 150 - 400 K/uL 1,540  1,161  1,127    .    Latest Ref Rng & Units 08/15/2023    2:19 PM 05/31/2023    3:59 PM 05/23/2023    1:59 PM  CMP  Glucose 70 - 99 mg/dL 409  99  92   BUN 6 - 20 mg/dL 11  12  12    Creatinine 0.44 - 1.00 mg/dL 8.11  9.14  7.82   Sodium 135 - 145 mmol/L 138  137  140   Potassium 3.5 - 5.1 mmol/L 4.1  4.2  4.2   Chloride 98 - 111 mmol/L 105  103  107   CO2 22 - 32 mmol/L 26  26  27    Calcium 8.9 - 10.3 mg/dL 9.8  9.9  9.5   Total Protein 6.5 - 8.1 g/dL 8.0    7.6   Total Bilirubin 0.0 - 1.2 mg/dL 0.3   0.3   Alkaline Phos 38 - 126 U/L 148   111   AST 15 - 41 U/L 27   14   ALT 0 - 44 U/L 30   11    . Lab Results  Component Value Date   LDH 282 (H) 08/15/2023   Lab Results  Component Value Date   FERRITIN 5 (L) 05/23/2023   Lab Results  Component Value Date   FERRITIN 31 08/15/2023     RADIOGRAPHIC STUDIES: I have personally reviewed the radiological images as listed and agreed with the findings in the report. No results found.   ASSESSMENT & PLAN:   Wonderful 53 year old female with:   1.  Polycythemia vera diagnosed in 2017.  She was found to have JAK2 positive disease.  She had an excellent response to [facility] with improvement in her constitutional symptoms.  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  2.  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  Follow-up with vascular surgery was recommended.   4.  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  Her platelets were decreasing although her white cell count slightly up.  Restarting Jakafi might help with this issue.  5. S/p Splenectomy  PLAN:  -Discussed lab results on 08/15/23 in detail with patient. CBC showed WBC of 13.8K, hemoglobin of 13.7, and platelets of 1540K. -hgb improved from 10.5 two months ago to 13.7 currently -CMP normal -patient has been tolerating hydroxyurea at dose of three tablets three days of the week and two tablets on all other days of the weeks with no major toxicity issues.  -hgb is not a limiting factor in regards to increasing hydroxyurea dose -will increase hydroxyurea dose to 3 tablets (1500mg ) daily -discussed platelet goal of 600K-800K in the context of her splenectomy and  risk vs benefit of hydroxyurea  -discussed that if her platelets continue to hold more than 28M, there may be a  role to restart JAK2 inhibitor -will repeat labs in 3-4 weeks with phone visit   FOLLOW-UP: Phone visit with Dr Candise Che in 4 weeks Labs 1 day prior  The total time spent in the appointment was 20 minutes* .  All of the patient's questions were answered with apparent satisfaction. The patient knows to call the clinic with any problems, questions or concerns.   Wyvonnia Lora MD MS AAHIVMS [facility] Hematology/Oncology Physician [facility]  .*Total Encounter Time as defined by the Centers for Medicare and Medicaid Services includes, in addition to the face-to-face time of a patient visit (documented in the note above) non-face-to-face time: obtaining and reviewing outside history, ordering and reviewing medications, tests or procedures, care coordination (communications with other health care professionals or caregivers) and documentation in the medical record.    I,Mitra Faeizi,acting as a Neurosurgeon for Wyvonnia Lora, MD.,have documented all relevant documentation on the behalf of Wyvonnia Lora, MD,as directed by  Wyvonnia Lora, MD while in the presence of Wyvonnia Lora, MD.  .I have reviewed the above documentation for accuracy and completeness, and I agree with the above. Johney Maine MD

## 2023-08-19 ENCOUNTER — Telehealth: Payer: Self-pay | Admitting: Hematology

## 2023-08-19 NOTE — Telephone Encounter (Signed)
 Left patient a vm regarding upcoming appointment

## 2023-09-15 ENCOUNTER — Other Ambulatory Visit: Payer: Self-pay

## 2023-09-15 DIAGNOSIS — D45 Polycythemia vera: Secondary | ICD-10-CM

## 2023-09-16 ENCOUNTER — Inpatient Hospital Stay: Attending: Oncology

## 2023-09-16 DIAGNOSIS — D45 Polycythemia vera: Secondary | ICD-10-CM | POA: Diagnosis present

## 2023-09-16 LAB — CBC WITH DIFFERENTIAL (CANCER CENTER ONLY)
Abs Immature Granulocytes: 0.02 10*3/uL (ref 0.00–0.07)
Basophils Absolute: 0.5 10*3/uL — ABNORMAL HIGH (ref 0.0–0.1)
Basophils Relative: 6 %
Eosinophils Absolute: 0.9 10*3/uL — ABNORMAL HIGH (ref 0.0–0.5)
Eosinophils Relative: 10 %
HCT: 44 % (ref 36.0–46.0)
Hemoglobin: 14 g/dL (ref 12.0–15.0)
Immature Granulocytes: 0 %
Lymphocytes Relative: 29 %
Lymphs Abs: 2.5 10*3/uL (ref 0.7–4.0)
MCH: 29.2 pg (ref 26.0–34.0)
MCHC: 31.8 g/dL (ref 30.0–36.0)
MCV: 91.9 fL (ref 80.0–100.0)
Monocytes Absolute: 0.8 10*3/uL (ref 0.1–1.0)
Monocytes Relative: 9 %
Neutro Abs: 4.2 10*3/uL (ref 1.7–7.7)
Neutrophils Relative %: 46 %
Platelet Count: 442 10*3/uL — ABNORMAL HIGH (ref 150–400)
RBC: 4.79 MIL/uL (ref 3.87–5.11)
RDW: 21.2 % — ABNORMAL HIGH (ref 11.5–15.5)
Smear Review: NORMAL
WBC Count: 8.8 10*3/uL (ref 4.0–10.5)
nRBC: 0 % (ref 0.0–0.2)

## 2023-09-16 LAB — CMP (CANCER CENTER ONLY)
ALT: 14 U/L (ref 0–44)
AST: 16 U/L (ref 15–41)
Albumin: 4.5 g/dL (ref 3.5–5.0)
Alkaline Phosphatase: 128 U/L — ABNORMAL HIGH (ref 38–126)
Anion gap: 6 (ref 5–15)
BUN: 10 mg/dL (ref 6–20)
CO2: 26 mmol/L (ref 22–32)
Calcium: 10 mg/dL (ref 8.9–10.3)
Chloride: 106 mmol/L (ref 98–111)
Creatinine: 0.81 mg/dL (ref 0.44–1.00)
GFR, Estimated: >60 mL/min (ref >60)
Glucose, Bld: 85 mg/dL (ref 70–99)
Potassium: 4.3 mmol/L (ref 3.5–5.1)
Sodium: 138 mmol/L (ref 135–145)
Total Bilirubin: 0.3 mg/dL (ref 0.0–1.2)
Total Protein: 8.2 g/dL — ABNORMAL HIGH (ref 6.5–8.1)

## 2023-09-16 LAB — RETICULOCYTES
Immature Retic Fract: 7.2 % (ref 2.3–15.9)
RBC.: 4.45 MIL/uL (ref 3.87–5.11)
Retic Count, Absolute: 83 10*3/uL (ref 19.0–186.0)
Retic Ct Pct: 1.9 % (ref 0.4–3.1)

## 2023-09-16 LAB — LACTATE DEHYDROGENASE: LDH: 193 U/L — ABNORMAL HIGH (ref 98–192)

## 2023-09-16 LAB — FERRITIN: Ferritin: 10 ng/mL — ABNORMAL LOW (ref 11–307)

## 2023-09-17 ENCOUNTER — Inpatient Hospital Stay (HOSPITAL_BASED_OUTPATIENT_CLINIC_OR_DEPARTMENT_OTHER): Admitting: Hematology

## 2023-09-17 DIAGNOSIS — D45 Polycythemia vera: Secondary | ICD-10-CM | POA: Diagnosis not present

## 2023-09-17 NOTE — Progress Notes (Signed)
 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 female who has been a previous patient of Dr. 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  She was last seen by Dr. Dirk Fredericks on 06/21/2022 and complained of frequent vascular headaches, but was doing otherwise well overall.  Today, she is accompanied by her brother.  She confirms that her platelets have been high recently. She reports that when she was first diagnosed she was only getting phlebotomies and had continued this for 7 years. However, at the beginning of last year she needed consideration for additional treatment with Jakafi for increase in her platelet counts and significant splenomegaly..  She reports that her spleen had been enlarged and she had not been symptomatic upfront. 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 The pain and spleen size limited her consumption of food. Patent reports that she was anemic prior to her splenectomy, though she is unsure of the cause.   She was initially on hydroxyurea for a short time, about a few months max, and she believes it was not suppressing her platelets. She was switched to Jakafi mid-last year 5 MG twice a day, which did improve her platelets. She has been on Eliquis since November, 2024. Since then, her platelets have been very elevated. She has not previously been on any blood thinners or Aspirin.  Patient reports that Jakafi did not shrink her spleen. She discontinued it as she was told it may have a tendency to cause clots. She was then restarted on the original dose once a day after her splenectomy.  Patient was previously given narcotics by her PCP for spleen issues, which cause her to fall asleep.   She never had a bone marrow biopsy and patient's last phlebotomy was May 2022. She reports that she previously received oral iron which Irritated her ulcer. She denies any bleeding concerns.  She complains of severe headaches that began 6 months ago. She does report a Fhx of migraines. Her PCP has not ordered any scans previously to further evaluate. Patient is not on any preventative medication for her migraines.   She presented to the ED on 07/24/22 and complained of chest pain, breathing issues, pain in left arm. She describes that she felt like she was having a heart attack. To control this pain, patient regularly takes an exceedingly high dose of Tylenol 400-500 MG 5-6 times a day. She has been taking this Tylenol dose at least since September, 2023. She last took 4 tablets of Tylenol this morning. Patient has not had any chest pain since her ED visit.   She denies any change in vision, speech, or hearing, and no new weakness in her hands or feet. No chemical exposure or leg swelling. Patient reports that she only drinks 1-2 glasses of water a day.  She does complain of back pain which began October/November 2023. She presented to the chiropractor which had improved symptoms. She has a sedentary work lifestyle. She does take muscle relaxers to improve her back pain. Patient also complains of sudden muscle spasms that cause excruciating body pain.  INTERVAL HISTORY:   Eileen [name] is a wonderful 52 y.o. female  who is being connected with via telemedicine visit today for continued evaluation and management of her JAK2 positive polycythemia vera.  Patient was last seen by me on 08/15/2023 and was doing well overall with no new medical complaints.   I connected with Eileen [name] on 09/17/23 at  8:40 AM EDT by telephone visit and verified that I am speaking with the correct person using two  identifiers.   I discussed the limitations, risks, security and privacy concerns of performing an evaluation and management service by telemedicine and the availability of in-person appointments. I also discussed with the patient that there may be a patient responsible charge related to this service. The patient expressed understanding and agreed to proceed.   Other persons participating in the visit and their role in the encounter: none   Patient's location: home  Provider's location: [facility]   Chief Complaint: evaluation and management of her JAK2 positive polycythemia vera    Today, She reports that she has been doing well overall since her last clinical visit. Patient reports no new symptoms since her last visit.   She has been tolerating her high-dose hydroxyurea at 3 capsules, 1500 mg,  once daily with no new or significant toxicity issues.    She reports that the tingling/redness discomfort in her fingers and toes has improved significantly.   Patient denies any bleeding issues.   MEDICAL HISTORY:  Past Medical History:  Diagnosis Date   Acute duodenal ulcer without mention of hemorrhage, perforation, or obstruction    Anemia    due to the polycythemia   COVID 2022   mild case   Dysrhythmia    History of palpations, for many years   GERD (gastroesophageal reflux disease)    Headache    takes Advil   Polycythemia vera (HCC)    Reflux esophagitis     SURGICAL HISTORY: Past Surgical History:  Procedure Laterality Date   ABDOMINAL HYSTERECTOMY  2011ish   BIOPSY  07/11/2020   Procedure: BIOPSY;  Surgeon: Sergio Dandy, MD;  Location: [facility];  Service: Endoscopy;;   CESAREAN SECTION     x 3    CHOLECYSTECTOMY     ESOPHAGOGASTRODUODENOSCOPY (EGD) WITH PROPOFOL N/A 07/11/2020   Procedure: ESOPHAGOGASTRODUODENOSCOPY (EGD) WITH PROPOFOL;  Surgeon: Sergio Dandy, MD;  Location: [facility];  Service: Endoscopy;  Laterality: N/A;   IR BONE MARROW BIOPSY &  ASPIRATION  08/12/2022   ORIF ANKLE FRACTURE Left 12/21/2014   Procedure: OPEN REDUCTION INTERNAL FIXATION (ORIF) LEFT LATERAL MALLEOLUS ANKLE FRACTURE;  Surgeon: Wes Hamman, MD;  Location: [facility] OR;  Service: Orthopedics;  Laterality: Left;  Needs RNFA   SPLENECTOMY, TOTAL N/A 04/02/2022   Procedure: OPEN SPLENECTOMY;  Surgeon: Sim Dryer, MD;  Location: [facility] OR;  Service: General;  Laterality: N/A;  TAP BLOCK   UPPER GI ENDOSCOPY      SOCIAL HISTORY: Social History   Socioeconomic History   Marital status: Married    Spouse name: Not on file   Number of children: 3   Years of education: Not on file   Highest education level: Not on file  Occupational History   Occupation: Copy ANALYST    Employer: [employer]  Tobacco Use   Smoking status: Never   Smokeless tobacco: Never  Vaping Use   Vaping status: Never Used  Substance and Sexual Activity   Alcohol use: No   Drug use: No   Sexual activity: Yes    Birth control/protection: Surgical  Other Topics Concern  Not on file  Social History Narrative   No caffeine    Right handed   Lives at home with her husband and daughter   Social Drivers of Health   Financial Resource Strain: Not on file  Food Insecurity: No Food Insecurity (02/28/2022)   Hunger Vital Sign    Worried About Running Out of Food in the Last Year: Never true    Ran Out of Food in the Last Year: Never true  Transportation Needs: No Transportation Needs (02/28/2022)   PRAPARE - Administrator, Civil Service (Medical): No    Lack of Transportation (Non-Medical): No  Physical Activity: Not on file  Stress: Not on file  Social Connections: Unknown (10/07/2021)   [facility]   Social Network    Social Network: Not on file  Intimate Partner Violence: Not At Risk (02/28/2022)   Humiliation, Afraid, Rape, and Kick questionnaire    Fear of Current or Ex-Partner: No    Emotionally Abused: No    Physically  Abused: No    Sexually Abused: No    FAMILY HISTORY: Family History  Problem Relation Age of Onset   Migraines Mother    Breast cancer Other        Great MA    Colon cancer Neg Hx     ALLERGIES:  has no known allergies.  MEDICATIONS:  Current Outpatient Medications  Medication Sig Dispense Refill   acetaminophen (TYLENOL) 500 MG tablet Take 1,000 mg by mouth every 6 (six) hours as needed for moderate pain or mild pain. (Patient not taking: Reported on 08/15/2023)     aspirin EC 81 MG tablet Take 1 tablet (81 mg total) by mouth daily. Swallow whole. 30 tablet 12   cyclobenzaprine (FLEXERIL) 10 MG tablet Take 10 mg by mouth 3 (three) times daily as needed.     ELIQUIS 5 MG TABS tablet Take 1 tablet by mouth twice daily 60 tablet 0   gabapentin (NEURONTIN) 100 MG capsule Take 1 capsule (100 mg total) by mouth 3 (three) times daily. (Patient not taking: Reported on 08/15/2023) 90 capsule 0   hydroxyurea (HYDREA) 500 MG capsule Take 3 capsules (1,500 mg total) by mouth daily. May take with food to minimize GI side effects. 90 capsule 2   ketorolac (TORADOL) 10 MG tablet Take 1 tablet (10 mg total) by mouth every 6 (six) hours as needed. (Patient not taking: Reported on 08/15/2023) 20 tablet 0   lidocaine (LIDODERM) 5 % Place 1 patch onto the skin daily. Remove & Discard patch within 12 hours or as directed by MD (Patient not taking: Reported on 08/15/2023) 30 patch 0   methylPREDNISolone (MEDROL DOSEPAK) 4 MG TBPK tablet Take 6 on day 1, 5 on day 2, 4 on day 3, 3 on day 4, 2 on day 5, 1 on day 6 (Patient not taking: Reported on 08/15/2023) 21 tablet 0   omeprazole (PRILOSEC OTC) 20 MG tablet Take 20 mg by mouth daily as needed (acid reflux).     ondansetron (ZOFRAN) 8 MG tablet Take 8 mg by mouth every 8 (eight) hours as needed for nausea or vomiting. (Patient not taking: Reported on 08/15/2023)     oxyCODONE 10 MG TABS Take 1 tablet (10 mg total) by mouth every 6 (six) hours as needed for moderate pain  (not releieved by tylenol). (Patient not taking: Reported on 08/15/2023) 25 tablet 0   propranolol (INDERAL) 20 MG tablet Take 1 tablet by mouth  twice daily 60 tablet 0   No current facility-administered medications for this visit.   Facility-Administered Medications Ordered in Other Visits  Medication Dose Route Frequency Provider Last Rate Last Admin   gadopentetate dimeglumine (MAGNEVIST) injection 18 mL  18 mL Intravenous Once PRN Glory Larsen, MD        REVIEW OF SYSTEMS:    10 Point review of Systems was done is negative except as noted above.   PHYSICAL EXAMINATION: Telemedicine visit   LABORATORY DATA:  I have reviewed the data as listed .    Latest Ref Rng & Units 09/16/2023    2:36 PM 08/15/2023    2:19 PM 05/31/2023    3:59 PM  CBC  WBC 4.0 - 10.5 K/uL 8.8  13.8  8.1   Hemoglobin 12.0 - 15.0 g/dL 14.7  82.9  56.2   Hematocrit 36.0 - 46.0 % 44.0  43.4  35.7   Platelets 150 - 400 K/uL 442  1,540  1,161    .    Latest Ref Rng & Units 09/16/2023    2:36 PM 08/15/2023    2:19 PM 05/31/2023    3:59 PM  CMP  Glucose 70 - 99 mg/dL 85  130  99   BUN 6 - 20 mg/dL 10  11  12    Creatinine 0.44 - 1.00 mg/dL 8.65  7.84  6.96   Sodium 135 - 145 mmol/L 138  138  137   Potassium 3.5 - 5.1 mmol/L 4.3  4.1  4.2   Chloride 98 - 111 mmol/L 106  105  103   CO2 22 - 32 mmol/L 26  26  26    Calcium 8.9 - 10.3 mg/dL 29.5  9.8  9.9   Total Protein 6.5 - 8.1 g/dL 8.2  8.0    Total Bilirubin 0.0 - 1.2 mg/dL 0.3  0.3    Alkaline Phos 38 - 126 U/L 128  148    AST 15 - 41 U/L 16  27    ALT 0 - 44 U/L 14  30     . Lab Results  Component Value Date   LDH 193 (H) 09/16/2023   Lab Results  Component Value Date   FERRITIN 10 (L) 09/16/2023    RADIOGRAPHIC STUDIES: I have personally reviewed the radiological images as listed and agreed with the findings in the report. No results found.   ASSESSMENT & PLAN:   Wonderful 53 year old female with:   1.  Polycythemia vera diagnosed in  2017.  She was found to have JAK2 positive disease.  She had an excellent response to Jakafi with improvement in her constitutional symptoms.  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   2.  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  Follow-up with vascular surgery was recommended.   4.  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  Her platelets were decreasing although her white cell count slightly up.  Restarting Jakafi might help with this issue.  5. S/p Splenectomy  PLAN:  -Discussed lab results from 09/16/2023 in detail with patient. CBC showed improvement with WBC of 8.8K, hemoglobin of 14.0, and platelets of 442K. -Platelets improved to 442K from 1.41M one month ago. -Hgb remains stable at 14.0 -WBC normal at 8.8K -CMP stable -patient is somewhat  iron deficient with ferritin 10, though she is not symptomatic and we discussed that we would not want to over-replace her iron in the context of polycythemia vera -will hold off on oral iron replacement unless there is any concern for significant fatigue or worsening anemia -patient has been tolerating hydroxyurea at dose of 3 tablets (1500mg ) once daily with no major toxicity issues.  -continue her current dose of hydroxyurea -will plan to repeat labs in a couple months  FOLLOW-UP: Phone visit with Dr Salomon Cree in 2 months Labs 1-2 days prior to phone visit.  The total time spent in the appointment was 20 minutes* .  All of the patient's questions were answered with apparent satisfaction. The patient knows to call the clinic with any problems, questions or concerns.   Jacquelyn Matt MD MS AAHIVMS [facility] Hematology/Oncology Physician [facility]  .*Total  Encounter Time as defined by the Centers for Medicare and Medicaid Services includes, in addition to the face-to-face time of a patient visit (documented in the note above) non-face-to-face time: obtaining and reviewing outside history, ordering and reviewing medications, tests or procedures, care coordination (communications with other health care professionals or caregivers) and documentation in the medical record.    I,Mitra Faeizi,acting as a Neurosurgeon for Jacquelyn Matt, MD.,have documented all relevant documentation on the behalf of Jacquelyn Matt, MD,as directed by  Jacquelyn Matt, MD while in the presence of Jacquelyn Matt, MD.   .I have reviewed the above documentation for accuracy and completeness, and I agree with the above. .Baylee Mccorkel Kishore Klare Criss MD

## 2023-10-10 ENCOUNTER — Inpatient Hospital Stay: Attending: Oncology

## 2023-10-10 ENCOUNTER — Other Ambulatory Visit: Payer: Self-pay

## 2023-10-10 DIAGNOSIS — D45 Polycythemia vera: Secondary | ICD-10-CM | POA: Diagnosis present

## 2023-10-10 LAB — CBC WITH DIFFERENTIAL (CANCER CENTER ONLY)
Abs Immature Granulocytes: 0.03 10*3/uL (ref 0.00–0.07)
Basophils Absolute: 0.3 10*3/uL — ABNORMAL HIGH (ref 0.0–0.1)
Basophils Relative: 4 %
Eosinophils Absolute: 0.7 10*3/uL — ABNORMAL HIGH (ref 0.0–0.5)
Eosinophils Relative: 7 %
HCT: 41.9 % (ref 36.0–46.0)
Hemoglobin: 13.6 g/dL (ref 12.0–15.0)
Immature Granulocytes: 0 %
Lymphocytes Relative: 29 %
Lymphs Abs: 2.6 10*3/uL (ref 0.7–4.0)
MCH: 27.3 pg (ref 26.0–34.0)
MCHC: 32.5 g/dL (ref 30.0–36.0)
MCV: 84.1 fL (ref 80.0–100.0)
Monocytes Absolute: 0.6 10*3/uL (ref 0.1–1.0)
Monocytes Relative: 7 %
Neutro Abs: 4.7 10*3/uL (ref 1.7–7.7)
Neutrophils Relative %: 53 %
Platelet Count: 810 10*3/uL — ABNORMAL HIGH (ref 150–400)
RBC: 4.98 MIL/uL (ref 3.87–5.11)
RDW: 21.2 % — ABNORMAL HIGH (ref 11.5–15.5)
WBC Count: 8.9 10*3/uL (ref 4.0–10.5)
nRBC: 0.3 % — ABNORMAL HIGH (ref 0.0–0.2)

## 2023-10-10 LAB — CMP (CANCER CENTER ONLY)
ALT: 13 U/L (ref 0–44)
AST: 18 U/L (ref 15–41)
Albumin: 4.4 g/dL (ref 3.5–5.0)
Alkaline Phosphatase: 115 U/L (ref 38–126)
Anion gap: 5 (ref 5–15)
BUN: 13 mg/dL (ref 6–20)
CO2: 28 mmol/L (ref 22–32)
Calcium: 9.8 mg/dL (ref 8.9–10.3)
Chloride: 105 mmol/L (ref 98–111)
Creatinine: 0.85 mg/dL (ref 0.44–1.00)
GFR, Estimated: >60 mL/min (ref >60)
Glucose, Bld: 82 mg/dL (ref 70–99)
Potassium: 4.1 mmol/L (ref 3.5–5.1)
Sodium: 138 mmol/L (ref 135–145)
Total Bilirubin: 0.3 mg/dL (ref 0.0–1.2)
Total Protein: 7.6 g/dL (ref 6.5–8.1)

## 2023-10-10 LAB — SAMPLE TO BLOOD BANK

## 2023-10-13 ENCOUNTER — Other Ambulatory Visit: Payer: Self-pay

## 2023-10-13 DIAGNOSIS — I7410 Embolism and thrombosis of unspecified parts of aorta: Secondary | ICD-10-CM

## 2023-10-22 ENCOUNTER — Ambulatory Visit: Payer: No Typology Code available for payment source | Admitting: Vascular Surgery

## 2023-10-22 ENCOUNTER — Ambulatory Visit (HOSPITAL_COMMUNITY): Payer: No Typology Code available for payment source

## 2023-11-14 ENCOUNTER — Other Ambulatory Visit: Payer: Self-pay

## 2023-11-14 DIAGNOSIS — D509 Iron deficiency anemia, unspecified: Secondary | ICD-10-CM

## 2023-11-14 DIAGNOSIS — D45 Polycythemia vera: Secondary | ICD-10-CM

## 2023-11-17 ENCOUNTER — Inpatient Hospital Stay: Attending: Oncology

## 2023-11-17 DIAGNOSIS — D509 Iron deficiency anemia, unspecified: Secondary | ICD-10-CM

## 2023-11-17 DIAGNOSIS — D45 Polycythemia vera: Secondary | ICD-10-CM | POA: Insufficient documentation

## 2023-11-17 LAB — CMP (CANCER CENTER ONLY)
ALT: 19 U/L (ref 0–44)
AST: 19 U/L (ref 15–41)
Albumin: 4.5 g/dL (ref 3.5–5.0)
Alkaline Phosphatase: 139 U/L — ABNORMAL HIGH (ref 38–126)
Anion gap: 5 (ref 5–15)
BUN: 11 mg/dL (ref 6–20)
CO2: 28 mmol/L (ref 22–32)
Calcium: 10 mg/dL (ref 8.9–10.3)
Chloride: 105 mmol/L (ref 98–111)
Creatinine: 0.74 mg/dL (ref 0.44–1.00)
GFR, Estimated: >60 mL/min (ref >60)
Glucose, Bld: 81 mg/dL (ref 70–99)
Potassium: 4.4 mmol/L (ref 3.5–5.1)
Sodium: 138 mmol/L (ref 135–145)
Total Bilirubin: 0.4 mg/dL (ref 0.0–1.2)
Total Protein: 8.1 g/dL (ref 6.5–8.1)

## 2023-11-17 LAB — RETICULOCYTES
Immature Retic Fract: 16.9 % — ABNORMAL HIGH (ref 2.3–15.9)
RBC.: 5.37 MIL/uL — ABNORMAL HIGH (ref 3.87–5.11)
Retic Count, Absolute: 44.6 10*3/uL (ref 19.0–186.0)
Retic Ct Pct: 0.8 % (ref 0.4–3.1)

## 2023-11-17 LAB — CBC WITH DIFFERENTIAL (CANCER CENTER ONLY)
Abs Immature Granulocytes: 0.01 10*3/uL (ref 0.00–0.07)
Basophils Absolute: 0.3 10*3/uL — ABNORMAL HIGH (ref 0.0–0.1)
Basophils Relative: 5 %
Eosinophils Absolute: 0.3 10*3/uL (ref 0.0–0.5)
Eosinophils Relative: 5 %
HCT: 45.9 % (ref 36.0–46.0)
Hemoglobin: 14.8 g/dL (ref 12.0–15.0)
Immature Granulocytes: 0 %
Lymphocytes Relative: 39 %
Lymphs Abs: 2.4 10*3/uL (ref 0.7–4.0)
MCH: 27 pg (ref 26.0–34.0)
MCHC: 32.2 g/dL (ref 30.0–36.0)
MCV: 83.8 fL (ref 80.0–100.0)
Monocytes Absolute: 0.6 10*3/uL (ref 0.1–1.0)
Monocytes Relative: 9 %
Neutro Abs: 2.6 10*3/uL (ref 1.7–7.7)
Neutrophils Relative %: 42 %
Platelet Count: 320 10*3/uL (ref 150–400)
RBC: 5.48 MIL/uL — ABNORMAL HIGH (ref 3.87–5.11)
RDW: 24.7 % — ABNORMAL HIGH (ref 11.5–15.5)
WBC Count: 6.3 10*3/uL (ref 4.0–10.5)
nRBC: 0 % (ref 0.0–0.2)

## 2023-11-17 LAB — LACTATE DEHYDROGENASE: LDH: 162 U/L (ref 98–192)

## 2023-11-18 LAB — FERRITIN: Ferritin: 14 ng/mL (ref 11–307)

## 2023-11-18 NOTE — Progress Notes (Signed)
 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 female who has been a previous patient of Dr. 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  She was last seen by Dr. Dirk Fredericks on 06/21/2022 and complained of frequent vascular headaches, but was doing otherwise well overall.  Today, she is accompanied by her brother.  She confirms that her platelets have been high recently. She reports that when she was first diagnosed she was only getting phlebotomies and had continued this for 7 years. However, at the beginning of last year she needed consideration for additional treatment with Jakafi  for increase in her platelet counts and significant splenomegaly..  She reports that her spleen had been enlarged and she had not been symptomatic upfront. 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 The pain and spleen size limited her consumption of food. Patent reports that she was anemic prior to her splenectomy, though she is unsure of the cause.   She was initially on hydroxyurea  for a short time, about a few months max, and she believes it was not suppressing her platelets. She was switched to Jakafi  mid-last year 5 MG twice a day, which did improve her platelets. She has been on Eliquis  since November, 2024. Since then, her platelets have been very elevated. She has not previously been on any blood thinners or Aspirin .  Patient reports that Jakafi  did not shrink her spleen. She discontinued it as she was told it may have a tendency to cause clots. She was then restarted on the original dose once a day after her  splenectomy. Patient was previously given narcotics by her PCP for spleen issues, which cause her to fall asleep.   She never had a bone marrow biopsy and patient's last phlebotomy was May 2022. She reports that she previously received oral iron  which Irritated her ulcer. She denies any bleeding concerns.  She complains of severe headaches that began 6 months ago. She does report a Fhx of migraines. Her PCP has not ordered any scans previously to further evaluate. Patient is not on any preventative medication for her migraines.   She presented to the ED on 07/24/22 and complained of chest pain, breathing issues, pain in left arm. She describes that she felt like she was having a heart attack. To control this pain, patient regularly takes an exceedingly high dose of Tylenol  400-500 MG 5-6 times a day. She has been taking this Tylenol  dose at least since September, 2023. She last took 4 tablets of Tylenol  this morning. Patient has not had any chest pain since her ED visit.   She denies any change in vision, speech, or hearing, and no new weakness in her hands or feet. No chemical exposure or leg swelling. Patient reports that she only drinks 1-2 glasses of water a day.  She does complain of back pain which began October/November 2023. She presented to the chiropractor which had improved symptoms. She has a sedentary work lifestyle. She does take muscle relaxers to improve her back pain. Patient also complains of sudden muscle spasms that cause excruciating body pain.  INTERVAL HISTORY:   DELPHINE [name] is a wonderful 53 y.o. female  who is being connected with via telemedicine visit today for continued evaluation and management of her JAK2 positive polycythemia vera.  I had a phone visit with patient on 09/17/2023 and reported significantly improved tingling/redness discomfort in her fingers and toes.   I connected with Eileen [name] on 11/19/2023  at  8:40 AM EDT by telephone visit and verified  that I am speaking with the correct person using two identifiers.   I discussed the limitations, risks, security and privacy concerns of performing an evaluation and management service by telemedicine and the availability of in-person appointments. I also discussed with the patient that there may be a patient responsible charge related to this service. The patient expressed understanding and agreed to proceed.   Other persons participating in the visit and their role in the encounter: none   Patient's location: home  Provider's location: [facility]   Chief Complaint: continued evaluation and management of her JAK2 positive polycythemia vera     Patient reports that she is tolerating her 1500 MG hydroxyurea  well with no acute new symptoms. She denies any infection issues, fever, chills, night sweats  Patient reports bone pain and generalized body aches.   She also notes some insomnia issues.   MEDICAL HISTORY:  Past Medical History:  Diagnosis Date   Acute duodenal ulcer without mention of hemorrhage, perforation, or obstruction    Anemia    due to the polycythemia   COVID 2022   mild case   Dysrhythmia    History of palpations, for many years   GERD (gastroesophageal reflux disease)    Headache    takes Advil   Polycythemia vera (HCC)    Reflux esophagitis     SURGICAL HISTORY: Past Surgical History:  Procedure Laterality Date   ABDOMINAL HYSTERECTOMY  2011ish   BIOPSY  07/11/2020   Procedure: BIOPSY;  Surgeon: Sergio Dandy, MD;  Location: [facility];  Service: Endoscopy;;   CESAREAN SECTION     x 3    CHOLECYSTECTOMY     ESOPHAGOGASTRODUODENOSCOPY (EGD) WITH PROPOFOL  N/A 07/11/2020   Procedure: ESOPHAGOGASTRODUODENOSCOPY (EGD) WITH PROPOFOL ;  Surgeon: Sergio Dandy, MD;  Location: [facility];  Service: Endoscopy;  Laterality: N/A;   IR BONE MARROW BIOPSY & ASPIRATION  08/12/2022   ORIF ANKLE FRACTURE Left 12/21/2014   Procedure: OPEN REDUCTION INTERNAL  FIXATION (ORIF) LEFT LATERAL MALLEOLUS ANKLE FRACTURE;  Surgeon: Wes Hamman, MD;  Location: [facility];  Service: Orthopedics;  Laterality: Left;  Needs RNFA   SPLENECTOMY, TOTAL N/A 04/02/2022   Procedure: OPEN SPLENECTOMY;  Surgeon: Sim Dryer, MD;  Location: [facility];  Service: General;  Laterality: N/A;  TAP BLOCK   UPPER GI ENDOSCOPY      SOCIAL HISTORY: Social History   Socioeconomic History   Marital status: Married    Spouse name: Not on file   Number of children: 3   Years of education: Not on file   Highest education level: Not on file  Occupational History   Occupation: Copy ANALYST    Employer: [employer]  Tobacco Use   Smoking status: Never   Smokeless tobacco: Never  Vaping Use   Vaping status: Never Used  Substance and Sexual Activity   Alcohol use: No   Drug use: No   Sexual activity: Yes    Birth control/protection: Surgical  Other Topics Concern   Not on file  Social History Narrative   No caffeine    Right handed   Lives at home with her  husband and daughter   Social Drivers of Corporate investment banker Strain: Not on file  Food Insecurity: No Food Insecurity (02/28/2022)   Hunger Vital Sign    Worried About Running Out of Food in the Last Year: Never true    Ran Out of Food in the Last Year: Never true  Transportation Needs: No Transportation Needs (02/28/2022)   PRAPARE - Administrator, Civil Service (Medical): No    Lack of Transportation (Non-Medical): No  Physical Activity: Not on file  Stress: Not on file  Social Connections: Unknown (10/07/2021)   [facility]   Social Network    Social Network: Not on file  Intimate Partner Violence: Not At Risk (02/28/2022)   Humiliation, Afraid, Rape, and Kick questionnaire    Fear of Current or Ex-Partner: No    Emotionally Abused: No    Physically Abused: No    Sexually Abused: No    FAMILY HISTORY: Family History  Problem Relation Age of Onset   Migraines  Mother    Breast cancer Other        Great [place]    Colon cancer Neg Hx     ALLERGIES:  has no known allergies.  MEDICATIONS:  Current Outpatient Medications  Medication Sig Dispense Refill   apixaban  (ELIQUIS ) 5 MG TABS tablet Take 1 tablet (5 mg total) by mouth 2 (two) times daily. 60 tablet 11   aspirin  EC 81 MG tablet Take 1 tablet (81 mg total) by mouth daily. Swallow whole. 30 tablet 12   cyclobenzaprine (FLEXERIL) 10 MG tablet Take 10 mg by mouth 3 (three) times daily as needed.     hydroxyurea  (HYDREA ) 500 MG capsule Take 3 capsules (1,500 mg total) by mouth daily. May take with food to minimize GI side effects. 90 capsule 3   omeprazole (PRILOSEC OTC) 20 MG tablet Take 20 mg by mouth daily as needed (acid reflux).     propranolol  (INDERAL ) 20 MG tablet Take 1 tablet by mouth twice daily 60 tablet 0   No current facility-administered medications for this visit.   Facility-Administered Medications Ordered in Other Visits  Medication Dose Route Frequency Provider Last Rate Last Admin   gadopentetate dimeglumine  (MAGNEVIST ) injection 18 mL  18 mL Intravenous Once PRN Glory Larsen, MD        REVIEW OF SYSTEMS:    10 Point review of Systems was done is negative except as noted above.   PHYSICAL EXAMINATION: Telemedicine visit   LABORATORY DATA:  I have reviewed the data as listed .    Latest Ref Rng & Units 11/17/2023    2:51 PM 10/10/2023    4:10 PM 09/16/2023    2:36 PM  CBC  WBC 4.0 - 10.5 K/uL 6.3  8.9  8.8   Hemoglobin 12.0 - 15.0 g/dL 62.1  30.8  65.7   Hematocrit 36.0 - 46.0 % 45.9  41.9  44.0   Platelets 150 - 400 K/uL 320  810  442    .    Latest Ref Rng & Units 11/17/2023    2:51 PM 10/10/2023    4:10 PM 09/16/2023    2:36 PM  CMP  Glucose 70 - 99 mg/dL 81  82  85   BUN 6 - 20 mg/dL 11  13  10    Creatinine 0.44 - 1.00 mg/dL 8.46  9.62  9.52   Sodium 135 - 145 mmol/L 138  138  138   Potassium 3.5 -  5.1 mmol/L 4.4  4.1  4.3   Chloride 98 - 111 mmol/L 105  105   106   CO2 22 - 32 mmol/L 28  28  26    Calcium 8.9 - 10.3 mg/dL 16.1  9.8  09.6   Total Protein 6.5 - 8.1 g/dL 8.1  7.6  8.2   Total Bilirubin 0.0 - 1.2 mg/dL 0.4  0.3  0.3   Alkaline Phos 38 - 126 U/L 139  115  128   AST 15 - 41 U/L 19  18  16    ALT 0 - 44 U/L 19  13  14     . Lab Results  Component Value Date   LDH 162 11/17/2023   Lab Results  Component Value Date   FERRITIN 14 11/17/2023    RADIOGRAPHIC STUDIES: I have personally reviewed the radiological images as listed and agreed with the findings in the report. No results found.   ASSESSMENT & PLAN:   Wonderful 53 year old female with:   1.  Polycythemia vera diagnosed in 2017.  She was found to have JAK2 positive disease.  She had an excellent response to Jakafi  with improvement in her constitutional symptoms.  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   2.  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  Follow-up with vascular surgery was recommended.   4.  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  Her platelets were decreasing although her white cell count slightly up.  Restarting Jakafi  might help with this issue.  5. S/p Splenectomy  PLAN:  -Discussed lab results from 11/17/2023 in detail with patient. CBC normal, showed WBC of 6.3K, hemoglobin of 14.8, HCT 45.9%, and platelets of 320K. -platelets are very well controlled at 320K -CMP stable  -LDH WNL at 162 -Ferritin 14 -We discussed that given her polycythemia vera and normal hgb , we would not replace her iron  at this time -patient has been tolerating hydroxyurea  at dose of 3 tablets (1500mg ) daily with no new or major toxicity issues.  -she will continue her current dose of hydroxyurea  at  1500 MG daily -we discussed sleep hygiene. Discussed that if her insomnia persists, she shall connect with her PCP to evaluate that further -will repeat labs in 2 months   FOLLOW-UP: RTC with Dr Salomon Cree with labs in 2 months  The total time spent in the appointment was 20 minutes* .  All of the patient's questions were answered with apparent satisfaction. The patient knows to call the clinic with any problems, questions or concerns.   Jacquelyn Matt MD MS AAHIVMS [facility] Hematology/Oncology Physician [facility]  .*Total Encounter Time as defined by the Centers for Medicare and Medicaid Services includes, in addition to the face-to-face time of a patient visit (documented in the note above) non-face-to-face time: obtaining and reviewing outside history, ordering and reviewing medications, tests or procedures, care coordination (communications with other health care professionals or caregivers) and documentation in the medical record.    I,Mitra Faeizi,acting as a Neurosurgeon for Jacquelyn Matt, MD.,have documented all relevant documentation on the behalf of Jacquelyn Matt, MD,as directed by  Jacquelyn Matt, MD while in the presence of Jacquelyn Matt, MD.  .I have reviewed the above documentation for accuracy and completeness, and I agree with the above. .Nyx Keady  Teddie Favre MD

## 2023-11-19 ENCOUNTER — Inpatient Hospital Stay (HOSPITAL_BASED_OUTPATIENT_CLINIC_OR_DEPARTMENT_OTHER): Admitting: Hematology

## 2023-11-19 DIAGNOSIS — D45 Polycythemia vera: Secondary | ICD-10-CM

## 2023-11-19 MED ORDER — APIXABAN 5 MG PO TABS: 5.0000 mg | ORAL_TABLET | Freq: Two times a day (BID) | ORAL | 11 refills | Status: AC

## 2023-11-19 MED ORDER — HYDROXYUREA 500 MG PO CAPS: 1500.0000 mg | ORAL_CAPSULE | Freq: Every day | ORAL | 3 refills | Status: AC

## 2024-01-26 ENCOUNTER — Other Ambulatory Visit: Payer: Self-pay

## 2024-01-26 DIAGNOSIS — D45 Polycythemia vera: Secondary | ICD-10-CM

## 2024-01-27 ENCOUNTER — Inpatient Hospital Stay (HOSPITAL_BASED_OUTPATIENT_CLINIC_OR_DEPARTMENT_OTHER): Admitting: Hematology

## 2024-01-27 ENCOUNTER — Inpatient Hospital Stay: Attending: Oncology

## 2024-01-27 VITALS — BP 119/76 | HR 71 | Temp 97.5°F | Resp 20 | Wt 199.7 lb

## 2024-01-27 DIAGNOSIS — Z7901 Long term (current) use of anticoagulants: Secondary | ICD-10-CM | POA: Diagnosis not present

## 2024-01-27 DIAGNOSIS — D72829 Elevated white blood cell count, unspecified: Secondary | ICD-10-CM | POA: Diagnosis not present

## 2024-01-27 DIAGNOSIS — D75839 Thrombocytosis, unspecified: Secondary | ICD-10-CM | POA: Diagnosis not present

## 2024-01-27 DIAGNOSIS — D45 Polycythemia vera: Secondary | ICD-10-CM | POA: Insufficient documentation

## 2024-01-27 DIAGNOSIS — K5900 Constipation, unspecified: Secondary | ICD-10-CM

## 2024-01-27 DIAGNOSIS — Z9081 Acquired absence of spleen: Secondary | ICD-10-CM | POA: Diagnosis not present

## 2024-01-27 DIAGNOSIS — Z86718 Personal history of other venous thrombosis and embolism: Secondary | ICD-10-CM | POA: Diagnosis not present

## 2024-01-27 LAB — CMP (CANCER CENTER ONLY)
ALT: 11 U/L (ref 0–44)
AST: 14 U/L — ABNORMAL LOW (ref 15–41)
Albumin: 4.2 g/dL (ref 3.5–5.0)
Alkaline Phosphatase: 93 U/L (ref 38–126)
Anion gap: 5 (ref 5–15)
BUN: 10 mg/dL (ref 6–20)
CO2: 28 mmol/L (ref 22–32)
Calcium: 9.3 mg/dL (ref 8.9–10.3)
Chloride: 106 mmol/L (ref 98–111)
Creatinine: 0.8 mg/dL (ref 0.44–1.00)
GFR, Estimated: >60 mL/min (ref >60)
Glucose, Bld: 101 mg/dL — ABNORMAL HIGH (ref 70–99)
Potassium: 3.7 mmol/L (ref 3.5–5.1)
Sodium: 139 mmol/L (ref 135–145)
Total Bilirubin: 0.5 mg/dL (ref 0.0–1.2)
Total Protein: 7.4 g/dL (ref 6.5–8.1)

## 2024-01-27 LAB — RETICULOCYTES
Immature Retic Fract: 11.2 % (ref 2.3–15.9)
RBC.: 3.52 MIL/uL — ABNORMAL LOW (ref 3.87–5.11)
Retic Count, Absolute: 34.5 K/uL (ref 19.0–186.0)
Retic Ct Pct: 1 % (ref 0.4–3.1)

## 2024-01-27 LAB — CBC WITH DIFFERENTIAL (CANCER CENTER ONLY)
Abs Immature Granulocytes: 0.01 K/uL (ref 0.00–0.07)
Basophils Absolute: 0.2 K/uL — ABNORMAL HIGH (ref 0.0–0.1)
Basophils Relative: 3 %
Eosinophils Absolute: 0.3 K/uL (ref 0.0–0.5)
Eosinophils Relative: 6 %
HCT: 36.5 % (ref 36.0–46.0)
Hemoglobin: 12.2 g/dL (ref 12.0–15.0)
Immature Granulocytes: 0 %
Lymphocytes Relative: 40 %
Lymphs Abs: 2.1 K/uL (ref 0.7–4.0)
MCH: 34.9 pg — ABNORMAL HIGH (ref 26.0–34.0)
MCHC: 33.4 g/dL (ref 30.0–36.0)
MCV: 104.3 fL — ABNORMAL HIGH (ref 80.0–100.0)
Monocytes Absolute: 0.5 K/uL (ref 0.1–1.0)
Monocytes Relative: 9 %
Neutro Abs: 2.3 K/uL (ref 1.7–7.7)
Neutrophils Relative %: 42 %
Platelet Count: 398 K/uL (ref 150–400)
RBC: 3.5 MIL/uL — ABNORMAL LOW (ref 3.87–5.11)
RDW: 29.6 % — ABNORMAL HIGH (ref 11.5–15.5)
WBC Count: 5.3 K/uL (ref 4.0–10.5)
nRBC: 0 % (ref 0.0–0.2)

## 2024-01-27 LAB — FERRITIN: Ferritin: 123 ng/mL (ref 11–307)

## 2024-01-27 LAB — LACTATE DEHYDROGENASE: LDH: 154 U/L (ref 98–192)

## 2024-01-27 MED ORDER — SENNOSIDES-DOCUSATE SODIUM 8.6-50 MG PO TABS: 2.0000 | ORAL_TABLET | Freq: Every day | ORAL | 1 refills | Status: AC

## 2024-02-02 NOTE — Progress Notes (Signed)
 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 female who has been a previous patient of Dr. 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  She was last seen by Dr. Amadeo on 06/21/2022 and complained of frequent vascular headaches, but was doing otherwise well overall.  Today, she is accompanied by her brother.  She confirms that her platelets have been high recently. She reports that when she was first diagnosed she was only getting phlebotomies and had continued this for 7 years. However, at the beginning of last year she needed consideration for additional treatment with Jakafi  for increase in her platelet counts and significant splenomegaly..  She reports that her spleen had been enlarged and she had not been symptomatic upfront. 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 The pain and spleen size limited her consumption of food. Patent reports that she was anemic prior to her splenectomy, though she is unsure of the cause.   She was initially on hydroxyurea  for a short time, about a few months max, and she believes it was not suppressing her platelets. She was switched to Jakafi  mid-last year 5 MG twice a day, which did improve her platelets. She has been on Eliquis  since November, 2024. Since then, her platelets have been very elevated. She has not previously been on any blood thinners or Aspirin .  Patient reports that Jakafi  did not shrink her spleen. She discontinued it as she was told it may have a tendency to cause clots. She was then restarted on the original dose once a day after her  splenectomy. Patient was previously given narcotics by her PCP for spleen issues, which cause her to fall asleep.   She never had a bone marrow biopsy and patient's last phlebotomy was May 2022. She reports that she previously received oral iron  which Irritated her ulcer. She denies any bleeding concerns.  She complains of severe headaches that began 6 months ago. She does report a Fhx of migraines. Her PCP has not ordered any scans previously to further evaluate. Patient is not on any preventative medication for her migraines.   She presented to the ED on 07/24/22 and complained of chest pain, breathing issues, pain in left arm. She describes that she felt like she was having a heart attack. To control this pain, patient regularly takes an exceedingly high dose of Tylenol  400-500 MG 5-6 times a day. She has been taking this Tylenol  dose at least since September, 2023. She last took 4 tablets of Tylenol  this morning. Patient has not had any chest pain since her ED visit.   She denies any change in vision, speech, or hearing, and no new weakness in her hands or feet. No chemical exposure or leg swelling. Patient reports that she only drinks 1-2 glasses of water a day.  She does complain of back pain which began October/November 2023. She presented to the chiropractor which had improved symptoms. She has a sedentary work lifestyle. She does take muscle relaxers to improve her back pain. Patient also complains of sudden muscle spasms that cause excruciating body pain.  INTERVAL HISTORY:   Eileen [name] is a wonderful 54 y.o.  female here for continued evaluation and management of her JAK2 positive polycythemia vera.  She notes no acute new symptoms since her last clinic visit.  No fevers no chills no night sweats. No new symptoms of erythromelalgia No chest pain no shortness of breath.  MEDICAL HISTORY:  Past Medical History:  Diagnosis Date   Acute duodenal ulcer without mention of hemorrhage,  perforation, or obstruction    Anemia    due to the polycythemia   COVID 2022   mild case   Dysrhythmia    History of palpations, for many years   GERD (gastroesophageal reflux disease)    Headache    takes Advil   Polycythemia vera (HCC)    Reflux esophagitis     SURGICAL HISTORY: Past Surgical History:  Procedure Laterality Date   ABDOMINAL HYSTERECTOMY  2011ish   BIOPSY  07/11/2020   Procedure: BIOPSY;  Surgeon: Shila Gustav GAILS, MD;  Location: [facility];  Service: Endoscopy;;   CESAREAN SECTION     x 3    CHOLECYSTECTOMY     ESOPHAGOGASTRODUODENOSCOPY (EGD) WITH PROPOFOL  N/A 07/11/2020   Procedure: ESOPHAGOGASTRODUODENOSCOPY (EGD) WITH PROPOFOL ;  Surgeon: Shila Gustav GAILS, MD;  Location: [facility];  Service: Endoscopy;  Laterality: N/A;   IR BONE MARROW BIOPSY & ASPIRATION  08/12/2022   ORIF ANKLE FRACTURE Left 12/21/2014   Procedure: OPEN REDUCTION INTERNAL FIXATION (ORIF) LEFT LATERAL MALLEOLUS ANKLE FRACTURE;  Surgeon: Kay CHRISTELLA Cummins, MD;  Location: [facility] OR;  Service: Orthopedics;  Laterality: Left;  Needs RNFA   SPLENECTOMY, TOTAL N/A 04/02/2022   Procedure: OPEN SPLENECTOMY;  Surgeon: Vanderbilt Ned, MD;  Location: [facility] OR;  Service: General;  Laterality: N/A;  TAP BLOCK   UPPER GI ENDOSCOPY      SOCIAL HISTORY: Social History   Socioeconomic History   Marital status: Married    Spouse name: Not on file   Number of children: 3   Years of education: Not on file   Highest education level: Not on file  Occupational History   Occupation: Copy ANALYST    Employer: [employer]  Tobacco Use   Smoking status: Never   Smokeless tobacco: Never  Vaping Use   Vaping status: Never Used  Substance and Sexual Activity   Alcohol use: No   Drug use: No   Sexual activity: Yes    Birth control/protection: Surgical  Other Topics Concern   Not on file  Social History Narrative   No caffeine    Right handed   Lives at home with her husband and daughter    Social Drivers of Health   Financial Resource Strain: Not on file  Food Insecurity: No Food Insecurity (02/28/2022)   Hunger Vital Sign    Worried About Running Out of Food in the Last Year: Never true    Ran Out of Food in the Last Year: Never true  Transportation Needs: No Transportation Needs (02/28/2022)   PRAPARE - Administrator, Civil Service (Medical): No    Lack of Transportation (Non-Medical): No  Physical Activity: Not on file  Stress: Not on file  Social Connections: Unknown (10/07/2021)   [facility]   Social Network    Social Network: Not on file  Intimate Partner Violence: Not At Risk (02/28/2022)   Humiliation, Afraid, Rape, and Kick questionnaire    Fear of Current or Ex-Partner: No    Emotionally Abused: No    Physically Abused: No    Sexually Abused: No  FAMILY HISTORY: Family History  Problem Relation Age of Onset   Migraines Mother    Breast cancer Other        Great MA    Colon cancer Neg Hx     ALLERGIES:  has no known allergies.  MEDICATIONS:  Current Outpatient Medications  Medication Sig Dispense Refill   senna-docusate (SENNA S) 8.6-50 MG tablet Take 2 tablets by mouth at bedtime. 60 tablet 1   apixaban  (ELIQUIS ) 5 MG TABS tablet Take 1 tablet (5 mg total) by mouth 2 (two) times daily. 60 tablet 11   aspirin  EC 81 MG tablet Take 1 tablet (81 mg total) by mouth daily. Swallow whole. 30 tablet 12   cyclobenzaprine (FLEXERIL) 10 MG tablet Take 10 mg by mouth 3 (three) times daily as needed.     hydroxyurea  (HYDREA ) 500 MG capsule Take 3 capsules (1,500 mg total) by mouth daily. May take with food to minimize GI side effects. 90 capsule 3   omeprazole (PRILOSEC OTC) 20 MG tablet Take 20 mg by mouth daily as needed (acid reflux).     propranolol  (INDERAL ) 20 MG tablet Take 1 tablet by mouth twice daily 60 tablet 0   No current facility-administered medications for this visit.   Facility-Administered Medications  Ordered in Other Visits  Medication Dose Route Frequency Provider Last Rate Last Admin   gadopentetate dimeglumine  (MAGNEVIST ) injection 18 mL  18 mL Intravenous Once PRN Ines Onetha NOVAK, MD        REVIEW OF SYSTEMS:   .10 Point review of Systems was done is negative except as noted above.   PHYSICAL EXAMINATION:  .BP 119/76   Pulse 71   Temp (!) 97.5 F (36.4 C)   Resp 20   Wt 199 lb 11.2 oz (90.6 kg)   SpO2 99%   BMI 35.38 kg/m  . GENERAL:alert, in no acute distress and comfortable SKIN: no acute rashes, no significant lesions EYES: conjunctiva are pink and non-injected, sclera anicteric OROPHARYNX: MMM, no exudates, no oropharyngeal erythema or ulceration NECK: supple, no JVD LYMPH:  no palpable lymphadenopathy in the cervical, axillary or inguinal regions LUNGS: clear to auscultation b/l with normal respiratory effort HEART: regular rate & rhythm ABDOMEN:  normoactive bowel sounds , non tender, not distended. Extremity: no pedal edema PSYCH: alert & oriented x 3 with fluent speech NEURO: no focal motor/sensory deficits  LABORATORY DATA:  I have reviewed the data as listed .    Latest Ref Rng & Units 01/27/2024    1:54 PM 11/17/2023    2:51 PM 10/10/2023    4:10 PM  CBC  WBC 4.0 - 10.5 K/uL 5.3  6.3  8.9   Hemoglobin 12.0 - 15.0 g/dL 87.7  85.1  86.3   Hematocrit 36.0 - 46.0 % 36.5  45.9  41.9   Platelets 150 - 400 K/uL 398  320  810    .    Latest Ref Rng & Units 01/27/2024    1:54 PM 11/17/2023    2:51 PM 10/10/2023    4:10 PM  CMP  Glucose 70 - 99 mg/dL 898  81  82   BUN 6 - 20 mg/dL 10  11  13    Creatinine 0.44 - 1.00 mg/dL 9.19  9.25  9.14   Sodium 135 - 145 mmol/L 139  138  138   Potassium 3.5 - 5.1 mmol/L 3.7  4.4  4.1   Chloride 98 - 111 mmol/L 106  105  105   CO2  22 - 32 mmol/L 28  28  28    Calcium 8.9 - 10.3 mg/dL 9.3  89.9  9.8   Total Protein 6.5 - 8.1 g/dL 7.4  8.1  7.6   Total Bilirubin 0.0 - 1.2 mg/dL 0.5  0.4  0.3   Alkaline Phos 38 - 126 U/L  93  139  115   AST 15 - 41 U/L 14  19  18    ALT 0 - 44 U/L 11  19  13     . Lab Results  Component Value Date   LDH 154 01/27/2024   Lab Results  Component Value Date   FERRITIN 123 01/27/2024    RADIOGRAPHIC STUDIES: I have personally reviewed the radiological images as listed and agreed with the findings in the report. No results found.   ASSESSMENT & PLAN:   Wonderful 53 year old female with:   1.  Polycythemia vera diagnosed in 2017.  She was found to have JAK2 positive disease.  She had an excellent response to Jakafi  with improvement in her constitutional symptoms.  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   2.  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  Follow-up with vascular surgery was recommended.   4.  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  Her platelets were decreasing although her white cell count slightly up.  Restarting Jakafi  might help with this issue.  5. S/p Splenectomy  PLAN:  -Discussed lab results from 01/27/2024 in detail with patient.  CBC is within normal limits with a hemoglobin of 12.2, WBC count of 5.3k and a platelet count of 398k CMP within normal limits LDH within normal limits at 154 Ferritin is 123 No indication for additional IV iron  at this time No notable toxicities from patient's current dose of hydroxyurea  at this time -she will continue her current dose of hydroxyurea  at 1500 MG daily -we discussed sleep hygiene. Discussed that if her insomnia persists, she shall connect with her PCP to evaluate that further -will repeat labs in 2 months  - Discussed her concerns with constipation and discussed appropriate approach to use.  FOLLOW-UP: RTC with Dr Onesimo with  labs in 2 months .The total time spent in the appointment was 20 minutes* .  All of the patient's questions were answered with apparent satisfaction. The patient knows to call the clinic with any problems, questions or concerns.   Emaline Onesimo MD MS AAHIVMS [facility] Hematology/Oncology Physician [facility]  .*Total Encounter Time as defined by the Centers for Medicare and Medicaid Services includes, in addition to the face-to-face time of a patient visit (documented in the note above) non-face-to-face time: obtaining and reviewing outside history, ordering and reviewing medications, tests or procedures, care coordination (communications with other health care professionals or caregivers) and documentation in the medical record.

## 2024-02-18 IMAGING — US US EXTREM LOW VENOUS*R*
1 series · 14 of 24 positions shown · non-contrast
Comparison: None.

CLINICAL DATA: Leg swelling.

EXAM:
RIGHT LOWER EXTREMITY VENOUS DOPPLER ULTRASOUND
TECHNIQUE: Gray-scale sonography with compression, as well as color and duplex
ultrasound, were performed to evaluate the deep venous system(s)
from the level of the common femoral vein through the popliteal and
proximal calf veins.

[Series 1: us venous img lower uni right (dvt) · portal-venous · 14 of 34 slices shown]
[im 1/34]
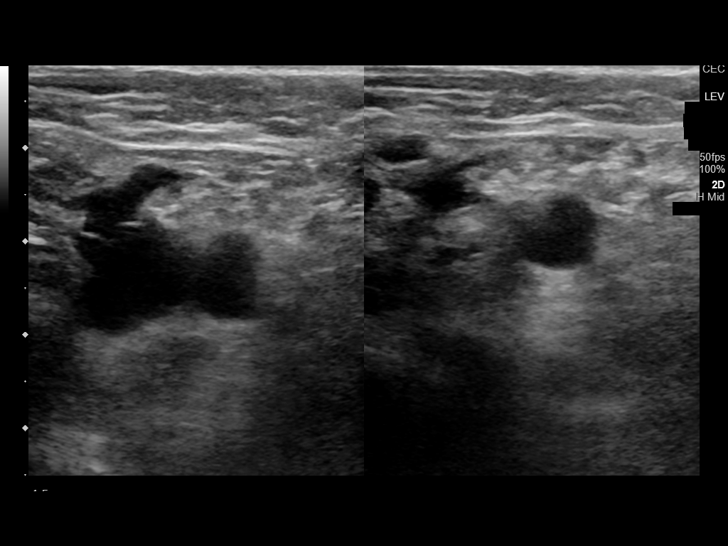
[im 3/34]
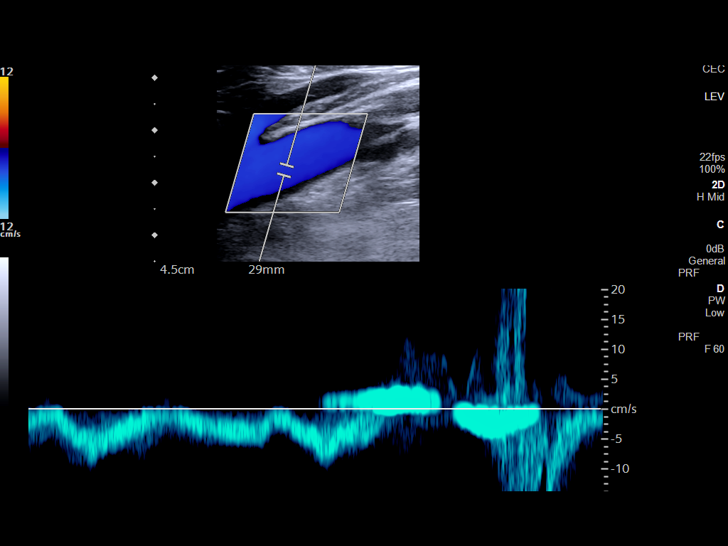
[im 6/34]
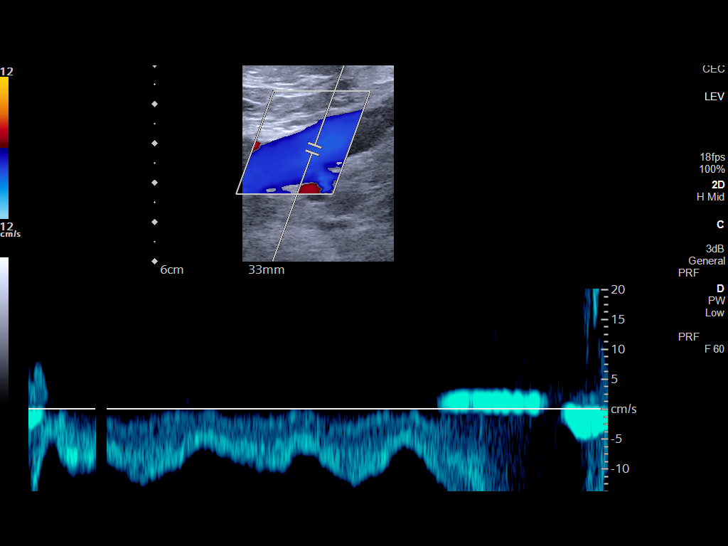
[im 9/34]
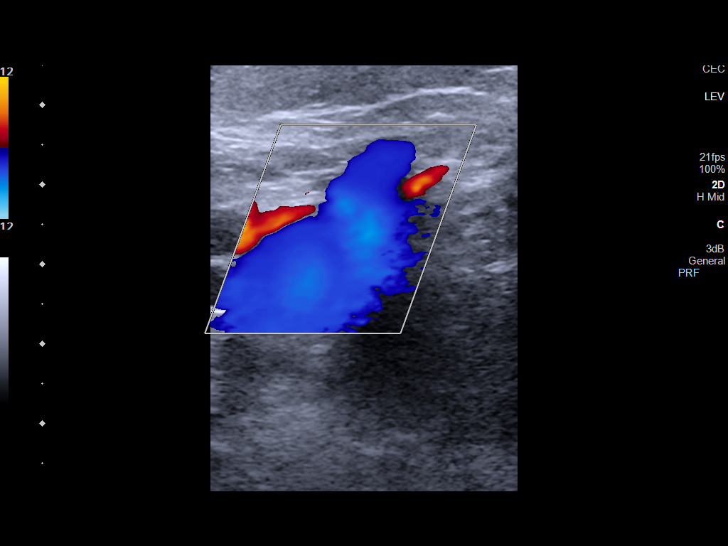
[im 11/34]
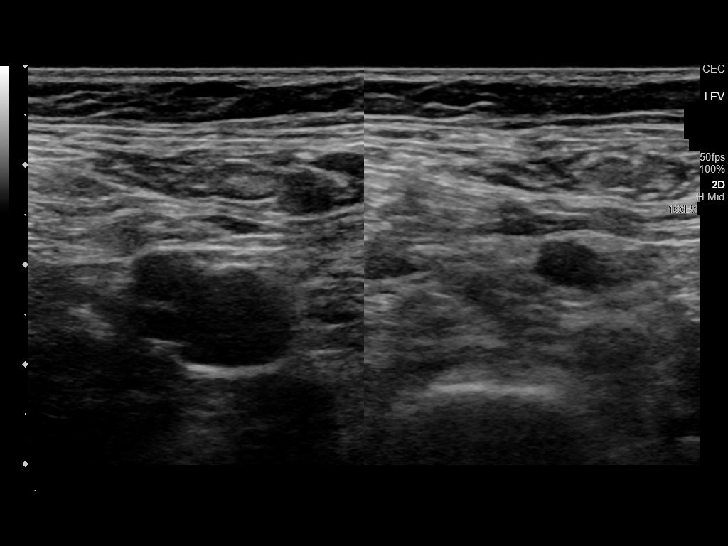
[im 13/34]
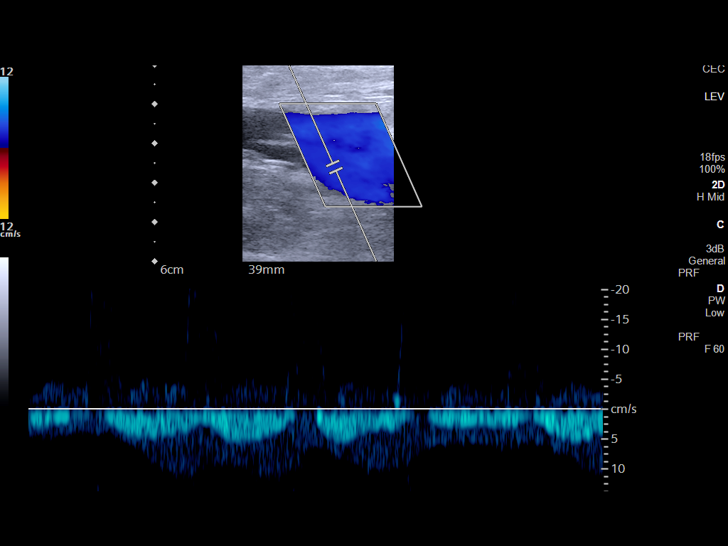
[im 16/34]
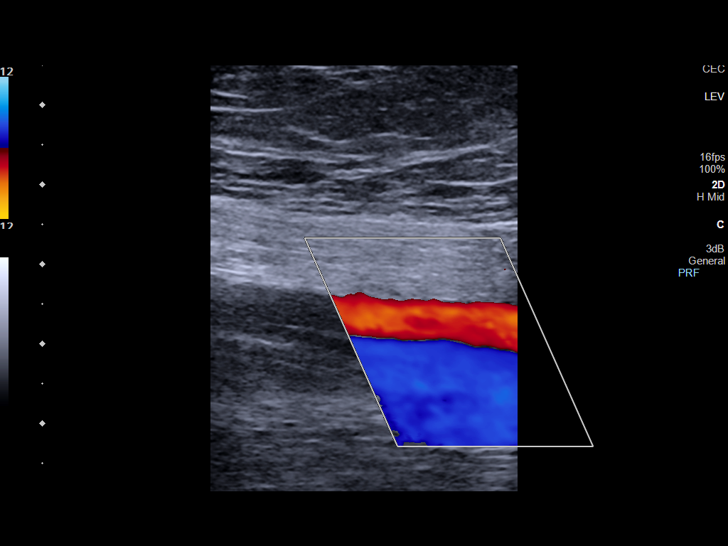
[im 18/34]
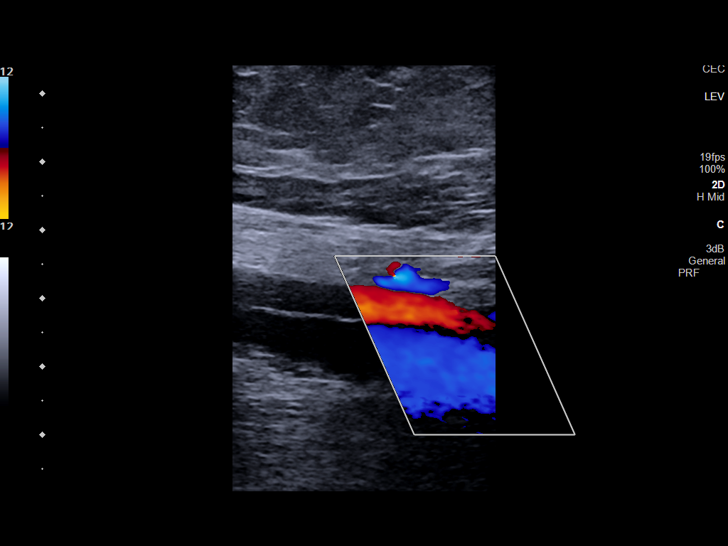
[im 21/34]
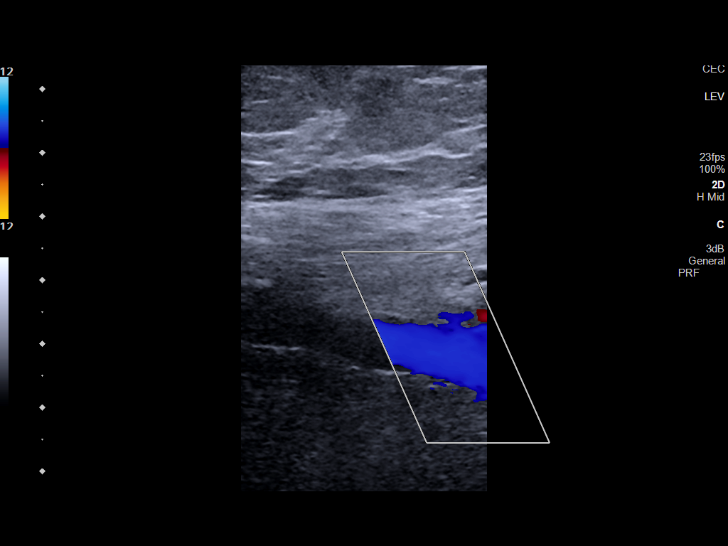
[im 23/34]
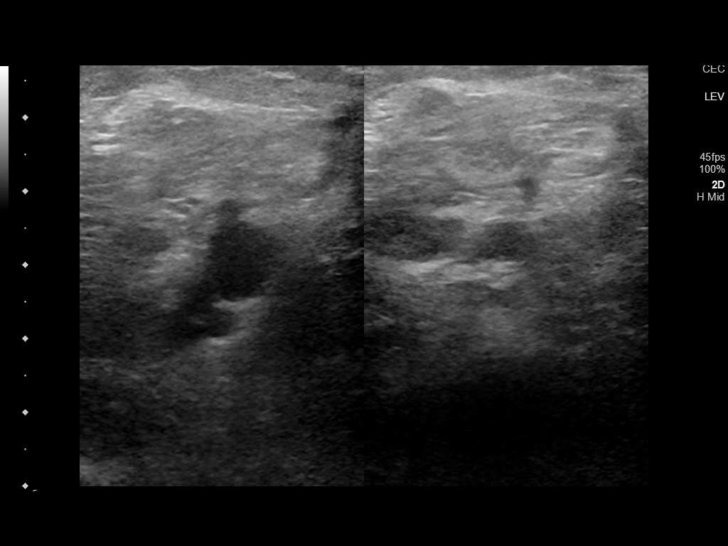
[im 26/34]
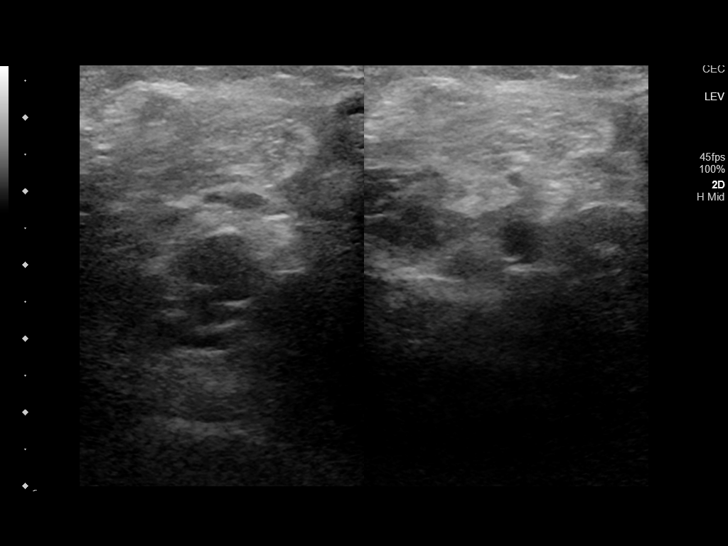
[im 28/34]
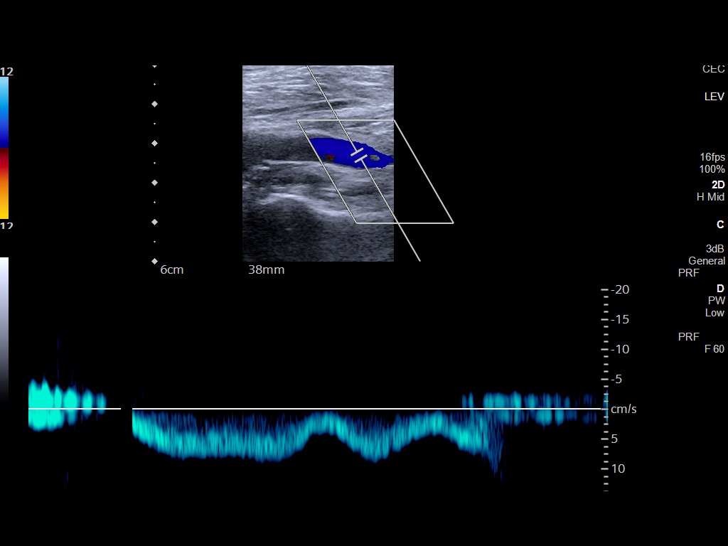
[im 31/34]
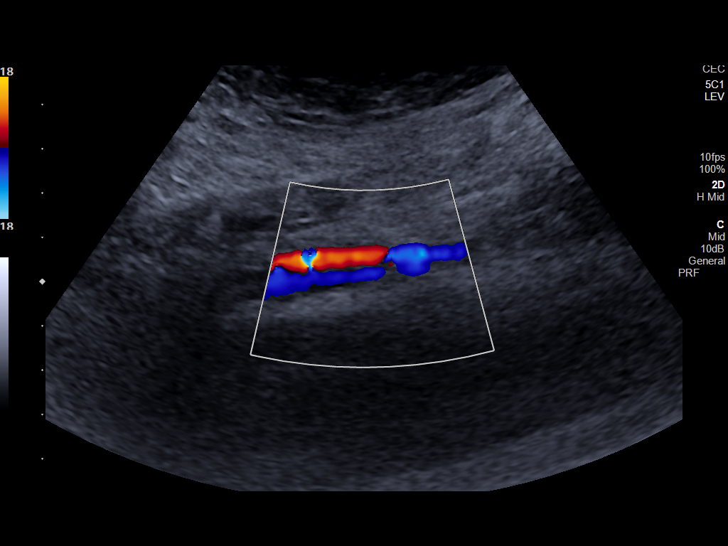
[im 34/34]
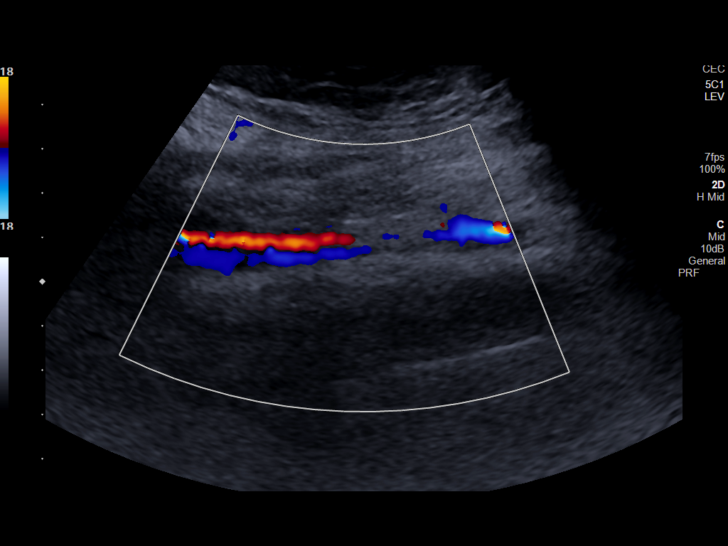

[14 of 24 positions shown; findings below may reference images not displayed]

FINDINGS: VENOUS

Normal compressibility of the common femoral, superficial femoral,
and popliteal veins, as well as the visualized calf veins. Portions
of the calf veins are obscured due to habitus. Visualized portions
of profunda femoral vein and great saphenous vein unremarkable. No
filling defects to suggest DVT on grayscale or color Doppler
imaging. Doppler waveforms show normal direction of venous flow,
normal respiratory plasticity and response to augmentation.

Limited views of the contralateral common femoral vein are
unremarkable.

OTHER

None.

Limitations: none
IMPRESSION: No evidence of right lower extremity DVT.

## 2024-02-18 IMAGING — DX DG TIBIA/FIBULA 2V*R*
2 series · 2 of 2 positions shown · non-contrast
Comparison: None.

CLINICAL DATA: Right leg pain and swelling for 2 days, initial
encounter

EXAM:
RIGHT TIBIA AND FIBULA - 2 VIEW

[tibia ap]
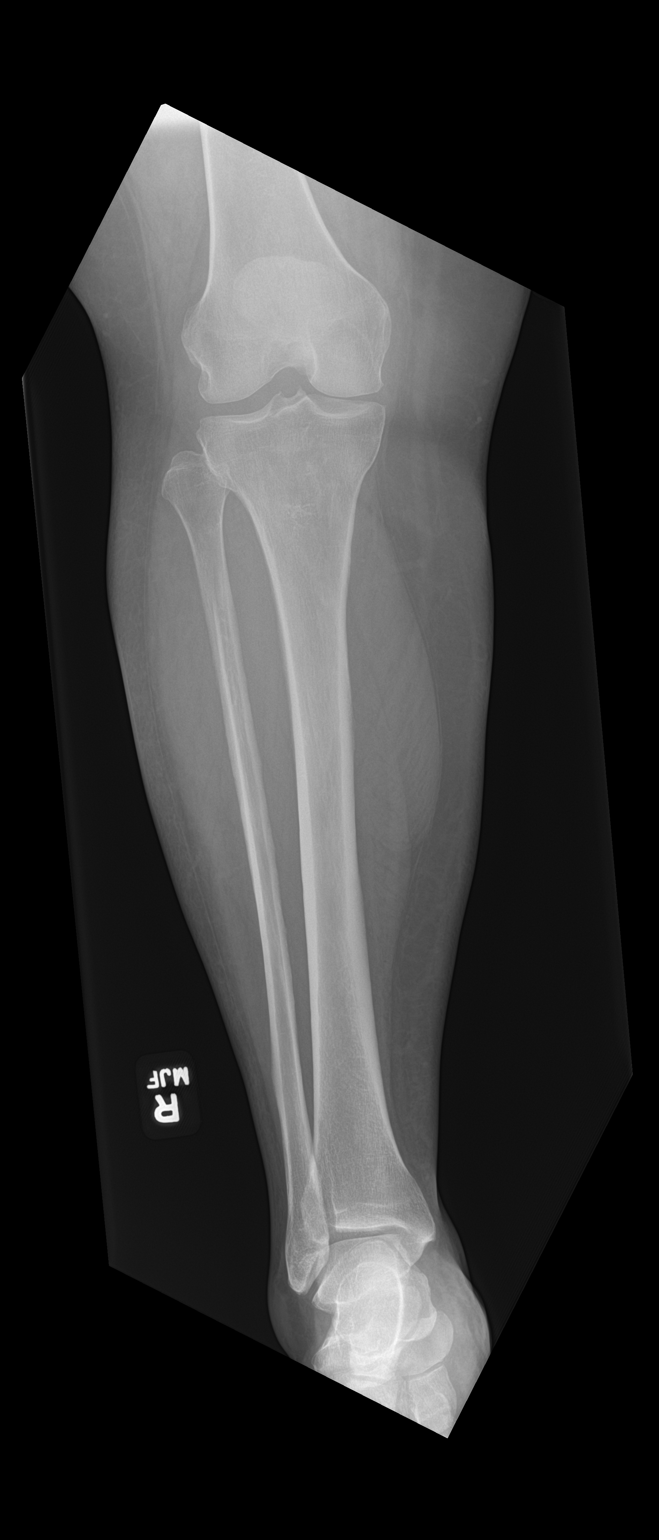

[tibia lat]
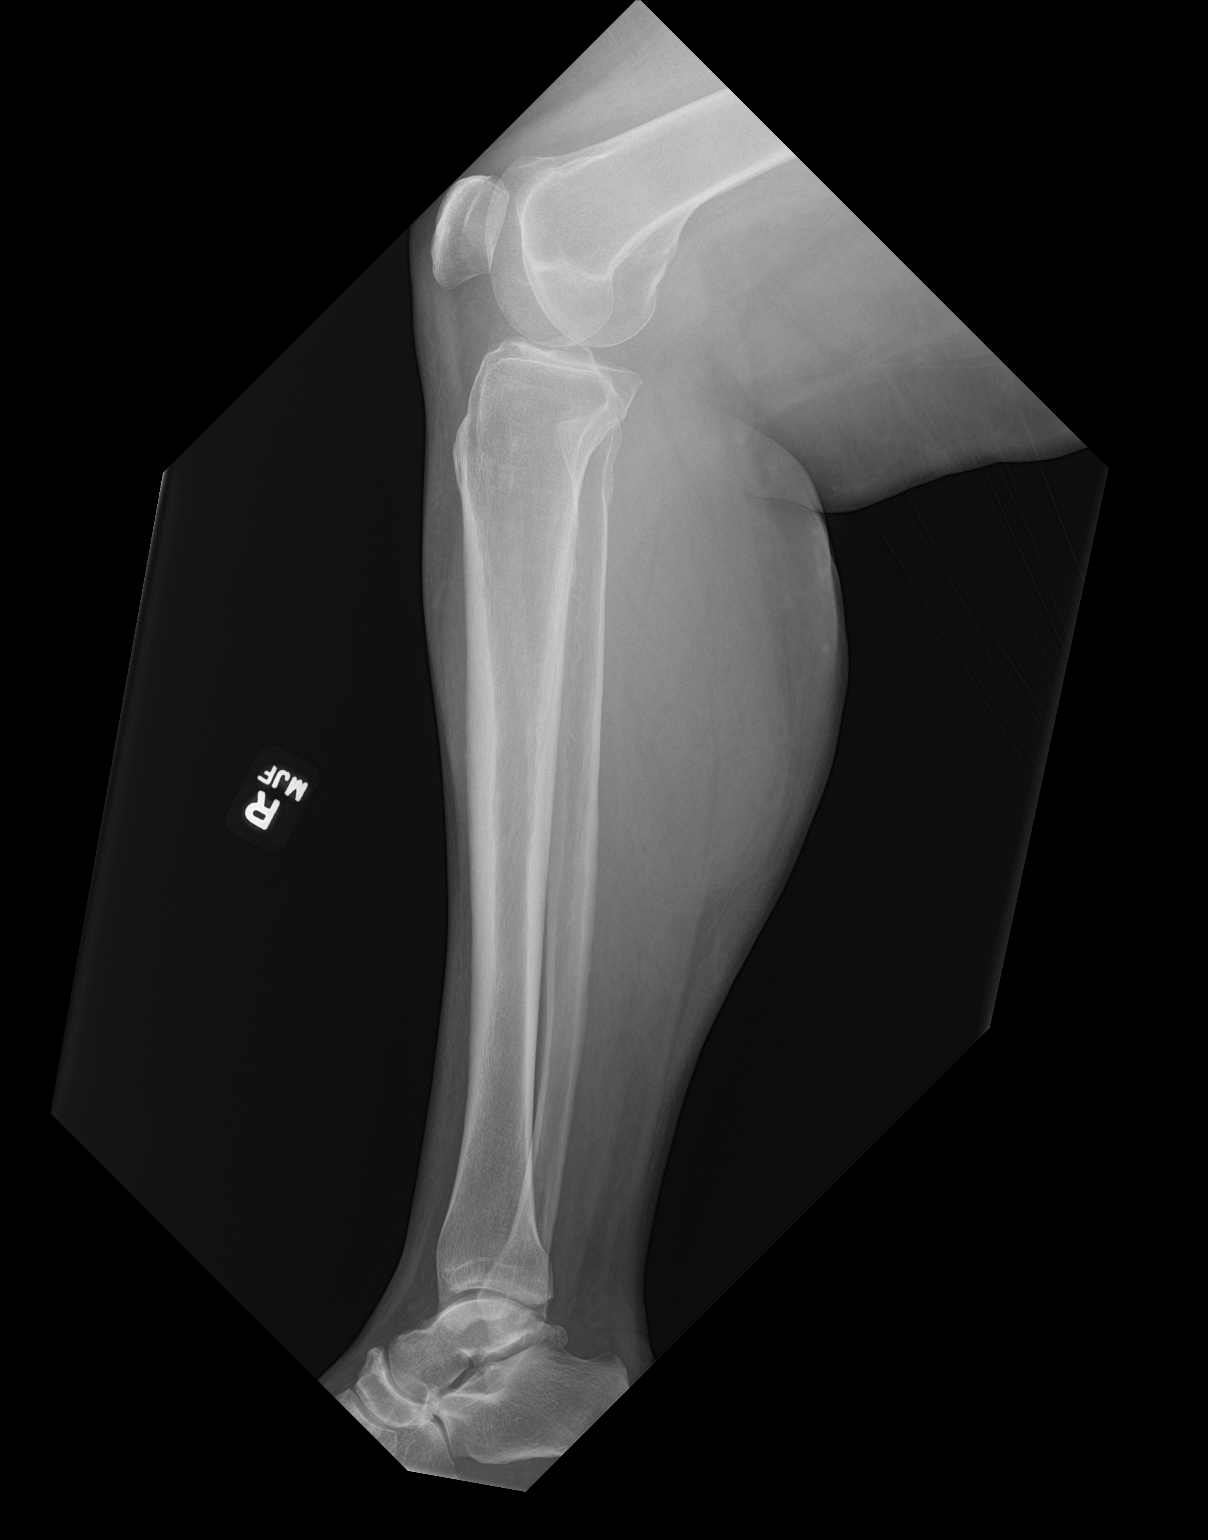

[2 of 2 positions shown; findings below may reference images not displayed]

FINDINGS: There is no evidence of fracture or other focal bone lesions. Soft
tissues are unremarkable.
IMPRESSION: No acute abnormality noted.

## 2024-03-22 ENCOUNTER — Other Ambulatory Visit: Payer: Self-pay

## 2024-03-22 DIAGNOSIS — D45 Polycythemia vera: Secondary | ICD-10-CM

## 2024-03-22 DIAGNOSIS — D509 Iron deficiency anemia, unspecified: Secondary | ICD-10-CM

## 2024-03-23 ENCOUNTER — Inpatient Hospital Stay: Attending: Oncology

## 2024-03-23 ENCOUNTER — Inpatient Hospital Stay: Admitting: Hematology

## 2024-03-23 VITALS — BP 118/56 | HR 83 | Temp 97.5°F | Resp 20 | Wt 204.5 lb

## 2024-03-23 DIAGNOSIS — D509 Iron deficiency anemia, unspecified: Secondary | ICD-10-CM

## 2024-03-23 DIAGNOSIS — D45 Polycythemia vera: Secondary | ICD-10-CM | POA: Insufficient documentation

## 2024-03-23 DIAGNOSIS — D471 Chronic myeloproliferative disease: Secondary | ICD-10-CM | POA: Diagnosis not present

## 2024-03-23 DIAGNOSIS — Z9081 Acquired absence of spleen: Secondary | ICD-10-CM | POA: Diagnosis not present

## 2024-03-23 DIAGNOSIS — D75839 Thrombocytosis, unspecified: Secondary | ICD-10-CM | POA: Diagnosis not present

## 2024-03-23 DIAGNOSIS — D72829 Elevated white blood cell count, unspecified: Secondary | ICD-10-CM | POA: Insufficient documentation

## 2024-03-23 DIAGNOSIS — Z7901 Long term (current) use of anticoagulants: Secondary | ICD-10-CM | POA: Insufficient documentation

## 2024-03-23 DIAGNOSIS — Z79899 Other long term (current) drug therapy: Secondary | ICD-10-CM | POA: Diagnosis not present

## 2024-03-23 LAB — FERRITIN: Ferritin: 69 ng/mL (ref 11–307)

## 2024-03-23 LAB — CBC WITH DIFFERENTIAL (CANCER CENTER ONLY)
Abs Immature Granulocytes: 0.03 K/uL (ref 0.00–0.07)
Basophils Absolute: 0.1 K/uL (ref 0.0–0.1)
Basophils Relative: 2 %
Eosinophils Absolute: 0.5 K/uL (ref 0.0–0.5)
Eosinophils Relative: 6 %
HCT: 35.9 % — ABNORMAL LOW (ref 36.0–46.0)
Hemoglobin: 12.4 g/dL (ref 12.0–15.0)
Immature Granulocytes: 0 %
Lymphocytes Relative: 28 %
Lymphs Abs: 2.2 K/uL (ref 0.7–4.0)
MCH: 40.1 pg — ABNORMAL HIGH (ref 26.0–34.0)
MCHC: 34.5 g/dL (ref 30.0–36.0)
MCV: 116.2 fL — ABNORMAL HIGH (ref 80.0–100.0)
Monocytes Absolute: 0.6 K/uL (ref 0.1–1.0)
Monocytes Relative: 8 %
Neutro Abs: 4.4 K/uL (ref 1.7–7.7)
Neutrophils Relative %: 56 %
Platelet Count: 458 K/uL — ABNORMAL HIGH (ref 150–400)
RBC: 3.09 MIL/uL — ABNORMAL LOW (ref 3.87–5.11)
RDW: 17.3 % — ABNORMAL HIGH (ref 11.5–15.5)
WBC Count: 7.7 K/uL (ref 4.0–10.5)
nRBC: 0 % (ref 0.0–0.2)

## 2024-03-23 LAB — LACTATE DEHYDROGENASE: LDH: 207 U/L — ABNORMAL HIGH (ref 98–192)

## 2024-03-23 LAB — CMP (CANCER CENTER ONLY)
ALT: 10 U/L (ref 0–44)
AST: 14 U/L — ABNORMAL LOW (ref 15–41)
Albumin: 4.2 g/dL (ref 3.5–5.0)
Alkaline Phosphatase: 99 U/L (ref 38–126)
Anion gap: 4 — ABNORMAL LOW (ref 5–15)
BUN: 13 mg/dL (ref 6–20)
CO2: 28 mmol/L (ref 22–32)
Calcium: 10 mg/dL (ref 8.9–10.3)
Chloride: 106 mmol/L (ref 98–111)
Creatinine: 0.86 mg/dL (ref 0.44–1.00)
GFR, Estimated: >60 mL/min (ref >60)
Glucose, Bld: 92 mg/dL (ref 70–99)
Potassium: 4 mmol/L (ref 3.5–5.1)
Sodium: 138 mmol/L (ref 135–145)
Total Bilirubin: 0.4 mg/dL (ref 0.0–1.2)
Total Protein: 7.4 g/dL (ref 6.5–8.1)

## 2024-03-23 LAB — RETICULOCYTES
Immature Retic Fract: 9.7 % (ref 2.3–15.9)
RBC.: 3.1 MIL/uL — ABNORMAL LOW (ref 3.87–5.11)
Retic Count, Absolute: 52.1 K/uL (ref 19.0–186.0)
Retic Ct Pct: 1.7 % (ref 0.4–3.1)

## 2024-03-23 NOTE — Progress Notes (Signed)
 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 female who has been a previous patient of Dr. 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  She was last seen by Dr. Amadeo on 06/21/2022 and complained of frequent vascular headaches, but was doing otherwise well overall.  Today, she is accompanied by her brother.  She confirms that her platelets have been high recently. She reports that when she was first diagnosed she was only getting phlebotomies and had continued this for 7 years. However, at the beginning of last year she needed consideration for additional treatment with Jakafi  for increase in her platelet counts and significant splenomegaly..  She reports that her spleen had been enlarged and she had not been symptomatic upfront. 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 The pain and spleen size limited her consumption of food. Patent reports that she was anemic prior to her splenectomy, though she is unsure of the cause.   She was initially on hydroxyurea  for a short time, about a few months max, and she believes it was not suppressing her platelets. She was switched to Jakafi  mid-last year 5 MG twice a day, which did improve her platelets. She has been on Eliquis  since November, 2024. Since then, her platelets have been very elevated. She has not previously been on any blood thinners or Aspirin .  Patient reports that Jakafi  did not shrink her spleen. She discontinued it as she was told it may have a tendency to cause clots. She was then restarted on the original dose once a day after her  splenectomy. Patient was previously given narcotics by her PCP for spleen issues, which cause her to fall asleep.   She never had a bone marrow biopsy and patient's last phlebotomy was May 2022. She reports that she previously received oral iron  which Irritated her ulcer. She denies any bleeding concerns.  She complains of severe headaches that began 6 months ago. She does report a Fhx of migraines. Her PCP has not ordered any scans previously to further evaluate. Patient is not on any preventative medication for her migraines.   She presented to the ED on 07/24/22 and complained of chest pain, breathing issues, pain in left arm. She describes that she felt like she was having a heart attack. To control this pain, patient regularly takes an exceedingly high dose of Tylenol  400-500 MG 5-6 times a day. She has been taking this Tylenol  dose at least since September, 2023. She last took 4 tablets of Tylenol  this morning. Patient has not had any chest pain since her ED visit.   She denies any change in vision, speech, or hearing, and no new weakness in her hands or feet. No chemical exposure or leg swelling. Patient reports that she only drinks 1-2 glasses of water a day.  She does complain of back pain which began October/November 2023. She presented to the chiropractor which had improved symptoms. She has a sedentary work lifestyle. She does take muscle relaxers to improve her back pain. Patient also complains of sudden muscle spasms that cause excruciating body pain.  INTERVAL HISTORY:   Eileen [name] is a wonderful 53 y.o. female  here for continued evaluation and management of her JAK2 positive polycythemia vera. She is accompanied by her husband today.  Last seen by me on 01/27/2024 and was doing well at that time without any new complaints.  Today, she says that she is doing well - tolerating 1500 mg of Hydroxyurea , with only some hair loss noted, but no nail changes. Denies any infection issues,  unexpected weight loss, chest pain, abdominal pain, new lumps/bumps, or leg swelling. Additionally, has not had any recent medication changes, and has been staying UTD with her recommended health maintenance per her PCP Burney Darice CROME, MD.   MEDICAL HISTORY:  Past Medical History:  Diagnosis Date   Acute duodenal ulcer without mention of hemorrhage, perforation, or obstruction    Anemia    due to the polycythemia   COVID 2022   mild case   Dysrhythmia    History of palpations, for many years   GERD (gastroesophageal reflux disease)    Headache    takes Advil   Polycythemia vera (HCC)    Reflux esophagitis     SURGICAL HISTORY: Past Surgical History:  Procedure Laterality Date   ABDOMINAL HYSTERECTOMY  2011ish   BIOPSY  07/11/2020   Procedure: BIOPSY;  Surgeon: Shila Gustav GAILS, MD;  Location: [facility];  Service: Endoscopy;;   CESAREAN SECTION     x 3    CHOLECYSTECTOMY     ESOPHAGOGASTRODUODENOSCOPY (EGD) WITH PROPOFOL  N/A 07/11/2020   Procedure: ESOPHAGOGASTRODUODENOSCOPY (EGD) WITH PROPOFOL ;  Surgeon: Shila Gustav GAILS, MD;  Location: [facility];  Service: Endoscopy;  Laterality: N/A;   IR BONE MARROW BIOPSY & ASPIRATION  08/12/2022   ORIF ANKLE FRACTURE Left 12/21/2014   Procedure: OPEN REDUCTION INTERNAL FIXATION (ORIF) LEFT LATERAL MALLEOLUS ANKLE FRACTURE;  Surgeon: Kay CHRISTELLA Cummins, MD;  Location: [facility] OR;  Service: Orthopedics;  Laterality: Left;  Needs RNFA   SPLENECTOMY, TOTAL N/A 04/02/2022   Procedure: OPEN SPLENECTOMY;  Surgeon: Vanderbilt Ned, MD;  Location: [facility] OR;  Service: General;  Laterality: N/A;  TAP BLOCK   UPPER GI ENDOSCOPY      SOCIAL HISTORY: Social History   Socioeconomic History   Marital status: Married    Spouse name: Not on file   Number of children: 3   Years of education: Not on file   Highest education level: Not on file  Occupational History   Occupation: Copy ANALYST    Employer: [employer] copy[employer]  Tobacco Use   Smoking  status: Never   Smokeless tobacco: Never  Vaping Use   Vaping status: Never Used  Substance and Sexual Activity   Alcohol use: No   Drug use: No   Sexual activity: Yes    Birth control/protection: Surgical  Other Topics Concern   Not on file  Social History Narrative   No caffeine    Right handed   Lives at home with her husband and daughter   Social Drivers of Health   Financial Resource Strain: Not on file  Food Insecurity: No Food Insecurity (02/28/2022)   Hunger Vital Sign    Worried About Running Out of Food in the Last Year: Never true    Ran Out of Food in the Last Year: Never true  Transportation Needs: No Transportation Needs (02/28/2022)   PRAPARE - Administrator, Civil Service (Medical): No    Lack of Transportation (Non-Medical): No  Physical Activity: Not on file  Stress: Not on file  Social Connections: Unknown (10/07/2021)   Received from [facility]   Social Network    Social Network: Not on file  Intimate Partner Violence: Not At Risk (02/28/2022)   Humiliation, Afraid, Rape, and Kick questionnaire    Fear of Current or Ex-Partner: No    Emotionally Abused: No    Physically Abused: No    Sexually Abused: No    FAMILY HISTORY: Family History  Problem Relation Age of Onset   Migraines Mother    Breast cancer Other        Great [place]    Colon cancer Neg Hx     ALLERGIES:  has no known allergies.  MEDICATIONS:  Current Outpatient Medications  Medication Sig Dispense Refill   apixaban  (ELIQUIS ) 5 MG TABS tablet Take 1 tablet (5 mg total) by mouth 2 (two) times daily. 60 tablet 11   aspirin  EC 81 MG tablet Take 1 tablet (81 mg total) by mouth daily. Swallow whole. 30 tablet 12   cyclobenzaprine (FLEXERIL) 10 MG tablet Take 10 mg by mouth 3 (three) times daily as needed.     hydroxyurea  (HYDREA ) 500 MG capsule Take 3 capsules (1,500 mg total) by mouth daily. May take with food to minimize GI side effects. 90 capsule 3   omeprazole (PRILOSEC  OTC) 20 MG tablet Take 20 mg by mouth daily as needed (acid reflux).     propranolol  (INDERAL ) 20 MG tablet Take 1 tablet by mouth twice daily 60 tablet 0   senna-docusate (SENNA S) 8.6-50 MG tablet Take 2 tablets by mouth at bedtime. 60 tablet 1   No current facility-administered medications for this visit.   Facility-Administered Medications Ordered in Other Visits  Medication Dose Route Frequency Provider Last Rate Last Admin   gadopentetate dimeglumine  (MAGNEVIST ) injection 18 mL  18 mL Intravenous Once PRN Ines Onetha NOVAK, MD        REVIEW OF SYSTEMS:   .10 Point review of Systems was done is negative except as noted above.  PHYSICAL EXAMINATION:  BP (!) 118/56   Pulse 83   Temp (!) 97.5 F (36.4 C)   Resp 20   Wt 204 lb 8 oz (92.8 kg)   SpO2 100%   BMI 36.23 kg/m  Wt Readings from Last 3 Encounters:  03/23/24 204 lb 8 oz (92.8 kg)  01/27/24 199 lb 11.2 oz (90.6 kg)  08/15/23 208 lb (94.3 kg)  body mass index is 36.23 kg/m. SABRA GENERAL:alert, in no acute distress and comfortable SKIN: no acute rashes, no significant lesions EYES: conjunctiva are pink and non-injected, sclera anicteric OROPHARYNX: MMM, no exudates, no oropharyngeal erythema or ulceration NECK: supple, no JVD LYMPH:  no palpable lymphadenopathy in the cervical, axillary or inguinal regions LUNGS: clear to auscultation b/l with normal respiratory effort HEART: regular rate & rhythm ABDOMEN:  normoactive bowel sounds , non tender, not distended. Extremity: no pedal edema PSYCH: alert & oriented x 3 with fluent speech NEURO: no focal motor/sensory deficits   LABORATORY DATA:  I have reviewed the data as listed .    Latest Ref Rng & Units 03/23/2024    1:49 PM 01/27/2024    1:54 PM 11/17/2023    2:51 PM  CBC  WBC 4.0 - 10.5 K/uL 7.7  5.3  6.3   Hemoglobin 12.0 - 15.0 g/dL 87.5  87.7  85.1   Hematocrit 36.0 - 46.0 % 35.9  36.5  45.9   Platelets 150 - 400 K/uL 458  398  320        Latest Ref Rng  &  Units 01/27/2024    1:54 PM 11/17/2023    2:51 PM 10/10/2023    4:10 PM  CMP  Glucose 70 - 99 mg/dL 898  81  82   BUN 6 - 20 mg/dL 10  11  13    Creatinine 0.44 - 1.00 mg/dL 9.19  9.25  9.14   Sodium 135 - 145 mmol/L 139  138  138   Potassium 3.5 - 5.1 mmol/L 3.7  4.4  4.1   Chloride 98 - 111 mmol/L 106  105  105   CO2 22 - 32 mmol/L 28  28  28    Calcium 8.9 - 10.3 mg/dL 9.3  89.9  9.8   Total Protein 6.5 - 8.1 g/dL 7.4  8.1  7.6   Total Bilirubin 0.0 - 1.2 mg/dL 0.5  0.4  0.3   Alkaline Phos 38 - 126 U/L 93  139  115   AST 15 - 41 U/L 14  19  18    ALT 0 - 44 U/L 11  19  13     LDH: Lab Results  Component Value Date   LDH 154 01/27/2024   Reticulocyte Panel: Lab Results  Component Value Date   RETICCTPCT 1.7 03/23/2024   RBC 3.09 (L) 03/23/2024   Retic Count, Absolute     19.0 - 186.0 K/uL 52.1   Immature Retic Fract     2.3 - 15.9 % 9.7    Ferritin: Lab Results  Component Value Date   FERRITIN 123 01/27/2024   RADIOGRAPHIC STUDIES: I have personally reviewed the radiological images as listed and agreed with the findings in the report. No results found.   ASSESSMENT & PLAN:   Wonderful 53 year old female with:   1.  Polycythemia vera diagnosed in 2017.  She was found to have JAK2 positive disease.  She had an excellent response to Jakafi  with improvement in her constitutional symptoms.  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   2.  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  Follow-up with vascular surgery was recommended.   4.  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  Her platelets were decreasing although her white cell count slightly up.  Restarting Jakafi  might help with this issue.  5.  S/p Splenectomy  PLAN:  - Discussed lab results on 03/23/2024 in detail with patient: CBC stable with WBC of 7.7K, Hemoglobin of 12.4, and PLTs of 458K.  PLTs stable considering s/p splenectomy.  - Independently reviewed Ferritin, CMP, and LDH:  CMP is wnl  LDH is high normal at 207  Ferritin is 69  - No indication for additional IV iron  at this time - No notable toxicities from patient's current dose of hydroxyurea  at this time Continue her current dose of hydroxyurea  at 1500 MG daily Advised about sunburns while taking Hydroxyurea   - Recommend staying UTD with vaccines   FOLLOW-UP: RTC with Dr Onesimo with labs in 3 months.   .The total time spent in the appointment was 20 minutes* .  All of the patient's questions were answered with apparent satisfaction. The patient knows to call the clinic with any problems, questions or concerns.   Emaline Onesimo MD MS AAHIVMS [facility] Hematology/Oncology Physician [facility]  .*Total Encounter Time as defined by the Centers for Medicare  and Medicaid Services includes, in addition to the face-to-face time of a patient visit (documented in the note above) non-face-to-face time: obtaining and reviewing outside history, ordering and reviewing medications, tests or procedures, care coordination (communications with other health care professionals or caregivers) and documentation in the medical record.  I,Emily Lagle,acting as a Neurosurgeon for Emaline Saran, MD.,have documented all relevant documentation on the behalf of Emaline Saran, MD,as directed by  Emaline Saran, MD while in the presence of Emaline Saran, MD.

## 2024-05-25 ENCOUNTER — Encounter: Payer: Self-pay | Admitting: Family Medicine

## 2024-06-21 ENCOUNTER — Other Ambulatory Visit: Payer: Self-pay

## 2024-06-21 DIAGNOSIS — D509 Iron deficiency anemia, unspecified: Secondary | ICD-10-CM

## 2024-06-21 DIAGNOSIS — D45 Polycythemia vera: Secondary | ICD-10-CM

## 2024-06-22 ENCOUNTER — Inpatient Hospital Stay: Attending: Oncology

## 2024-06-22 ENCOUNTER — Inpatient Hospital Stay: Admitting: Hematology

## 2024-06-22 VITALS — BP 123/84 | HR 84 | Temp 97.3°F | Resp 20 | Wt 213.3 lb

## 2024-06-22 DIAGNOSIS — Z803 Family history of malignant neoplasm of breast: Secondary | ICD-10-CM | POA: Insufficient documentation

## 2024-06-22 DIAGNOSIS — D509 Iron deficiency anemia, unspecified: Secondary | ICD-10-CM

## 2024-06-22 DIAGNOSIS — D72829 Elevated white blood cell count, unspecified: Secondary | ICD-10-CM | POA: Diagnosis not present

## 2024-06-22 DIAGNOSIS — D45 Polycythemia vera: Secondary | ICD-10-CM

## 2024-06-22 DIAGNOSIS — D471 Chronic myeloproliferative disease: Secondary | ICD-10-CM | POA: Diagnosis not present

## 2024-06-22 DIAGNOSIS — D75839 Thrombocytosis, unspecified: Secondary | ICD-10-CM | POA: Diagnosis not present

## 2024-06-22 DIAGNOSIS — Z9081 Acquired absence of spleen: Secondary | ICD-10-CM | POA: Diagnosis not present

## 2024-06-22 DIAGNOSIS — Z7964 Long term (current) use of myelosuppressive agent: Secondary | ICD-10-CM | POA: Diagnosis not present

## 2024-06-22 DIAGNOSIS — Z7901 Long term (current) use of anticoagulants: Secondary | ICD-10-CM | POA: Diagnosis not present

## 2024-06-22 DIAGNOSIS — Z7982 Long term (current) use of aspirin: Secondary | ICD-10-CM | POA: Insufficient documentation

## 2024-06-22 LAB — CMP (CANCER CENTER ONLY)
ALT: 20 U/L (ref 0–44)
AST: 31 U/L (ref 15–41)
Albumin: 4.4 g/dL (ref 3.5–5.0)
Alkaline Phosphatase: 118 U/L (ref 38–126)
Anion gap: 9 (ref 5–15)
BUN: 13 mg/dL (ref 6–20)
CO2: 28 mmol/L (ref 22–32)
Calcium: 9.8 mg/dL (ref 8.9–10.3)
Chloride: 101 mmol/L (ref 98–111)
Creatinine: 0.91 mg/dL (ref 0.44–1.00)
GFR, Estimated: >60 mL/min
Glucose, Bld: 95 mg/dL (ref 70–99)
Potassium: 4.4 mmol/L (ref 3.5–5.1)
Sodium: 137 mmol/L (ref 135–145)
Total Bilirubin: 0.4 mg/dL (ref 0.0–1.2)
Total Protein: 8.4 g/dL — ABNORMAL HIGH (ref 6.5–8.1)

## 2024-06-22 LAB — RETICULOCYTES
Immature Retic Fract: 9.3 % (ref 2.3–15.9)
RBC.: 3.47 MIL/uL — ABNORMAL LOW (ref 3.87–5.11)
Retic Count, Absolute: 71.1 K/uL (ref 19.0–186.0)
Retic Ct Pct: 2.1 % (ref 0.4–3.1)

## 2024-06-22 LAB — CBC WITH DIFFERENTIAL (CANCER CENTER ONLY)
Abs Immature Granulocytes: 0.02 K/uL (ref 0.00–0.07)
Basophils Absolute: 0.2 K/uL — ABNORMAL HIGH (ref 0.0–0.1)
Basophils Relative: 3 %
Eosinophils Absolute: 0.4 K/uL (ref 0.0–0.5)
Eosinophils Relative: 5 %
HCT: 39.7 % (ref 36.0–46.0)
Hemoglobin: 13.6 g/dL (ref 12.0–15.0)
Immature Granulocytes: 0 %
Lymphocytes Relative: 31 %
Lymphs Abs: 2 K/uL (ref 0.7–4.0)
MCH: 39.1 pg — ABNORMAL HIGH (ref 26.0–34.0)
MCHC: 34.3 g/dL (ref 30.0–36.0)
MCV: 114.1 fL — ABNORMAL HIGH (ref 80.0–100.0)
Monocytes Absolute: 0.7 K/uL (ref 0.1–1.0)
Monocytes Relative: 11 %
Neutro Abs: 3.2 K/uL (ref 1.7–7.7)
Neutrophils Relative %: 50 %
Platelet Count: 544 K/uL — ABNORMAL HIGH (ref 150–400)
RBC: 3.48 MIL/uL — ABNORMAL LOW (ref 3.87–5.11)
RDW: 16.7 % — ABNORMAL HIGH (ref 11.5–15.5)
WBC Count: 6.5 K/uL (ref 4.0–10.5)
nRBC: 0 % (ref 0.0–0.2)

## 2024-06-22 LAB — LACTATE DEHYDROGENASE: LDH: 274 U/L — ABNORMAL HIGH (ref 105–235)

## 2024-06-22 LAB — FERRITIN: Ferritin: 46 ng/mL (ref 11–307)

## 2024-06-22 MED ORDER — TERBINAFINE HCL 1 % EX CREA: 1.0000 | TOPICAL_CREAM | Freq: Two times a day (BID) | CUTANEOUS | 1 refills | Status: AC

## 2024-06-22 NOTE — Progress Notes (Signed)
 " HEMATOLOGY ONCOLOGY PROGRESS NOTE  Date of service: 06/22/2024  Patient Care Team: Burney Darice CROME, MD as PCP - General (Family Medicine) Onesimo Emaline Brink, MD as Consulting Physician (Hematology)  CHIEF COMPLAINT/PURPOSE OF CONSULTATION: Follow-up for continued evaluation and management of JAK2 positive polycythemia vera.  HISTORY OF PRESENTING ILLNESS: Eileen [name] is a wonderful 54 y.o. female who has been a previous patient of Dr. 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   She was last seen by Dr. Amadeo on 06/21/2022 and complained of frequent vascular headaches, but was doing otherwise well overall.   Today, she is accompanied by her brother.  She confirms that her platelets have been high recently. She reports that when she was first diagnosed she was only getting phlebotomies and had continued this for 7 years. However, at the beginning of last year she needed consideration for additional treatment with Jakafi  for increase in her platelet counts and significant splenomegaly..   She reports that her spleen had been enlarged and she had not been symptomatic upfront. 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 The pain and spleen size limited her consumption of food. Patent reports that she was anemic prior to her splenectomy, though she is unsure of the cause.    She was initially on hydroxyurea  for a short time, about a few months max, and she believes it was not suppressing her platelets. She was switched to Jakafi  mid-last year 5 MG twice a day, which did improve her platelets. She has been on Eliquis  since November, 2024. Since then, her platelets have been very elevated. She has not previously been on any blood thinners or Aspirin .   Patient reports that Jakafi  did not shrink her spleen. She discontinued it as she was told it may have a tendency to cause clots. She was then restarted on the original dose once a day  after her splenectomy. Patient was previously given narcotics by her PCP for spleen issues, which cause her to fall asleep.    She never had a bone marrow biopsy and patient's last phlebotomy was May 2022. She reports that she previously received oral iron  which Irritated her ulcer. She denies any bleeding concerns.   She complains of severe headaches that began 6 months ago. She does report a Fhx of migraines. Her PCP has not ordered any scans previously to further evaluate. Patient is not on any preventative medication for her migraines.    She presented to the ED on 07/24/22 and complained of chest pain, breathing issues, pain in left arm. She describes that she felt like she was having a heart attack. To control this pain, patient regularly takes an exceedingly high dose of Tylenol  400-500 MG 5-6 times a day. She has been taking this Tylenol  dose at least since September, 2023. She last took 4 tablets of Tylenol  this morning. Patient has not had any chest pain since her ED visit.    She denies any change in vision, speech, or hearing, and no new weakness in her hands or feet. No chemical exposure or leg swelling. Patient reports that she only drinks 1-2 glasses of water a day.   She does complain of back pain which began October/November 2023. She presented to the chiropractor which had improved symptoms. She has a sedentary work lifestyle. She does take muscle relaxers to improve her back pain. Patient also complains of sudden muscle spasms that cause excruciating body pain.   SUMMARY OF ONCOLOGIC HISTORY:  Oncology History   No problem history exists.    INTERVAL HISTORY: Eileen [name] is a 54 y.o. female who is here today for continued evaluation and management of JAK2 positive polycythemia vera.   she was last seen by me on 03/23/2024; at the time she did not have any concerns and was doing well.   Today, she is doing well. She pointed out a red sore on her face that is spreading. She  reports that it itches and is only present on the face. She noticied her first symptoms since January 1st. She denies any dental issues of facial pain. She recently had an eyebrow wax done by an estetician and noted maybe it was an allergic reaction or dermatitis. She was using triamcinolone on the rash but stopped using it as the rash began spreading.   She is tolerating Hydroxyurea  well. She is no longer taking oral iron .   She denies any back pain, abdominal pain, new bone pains, leg swelling, diarrhea, and bowel/urinary changes.   REVIEW OF SYSTEMS:   10 Point review of systems of done and is negative except as noted above.  MEDICAL HISTORY Past Medical History:  Diagnosis Date   Acute duodenal ulcer without mention of hemorrhage, perforation, or obstruction    Anemia    due to the polycythemia   COVID 2022   mild case   Dysrhythmia    History of palpations, for many years   GERD (gastroesophageal reflux disease)    Headache    takes Advil   Polycythemia vera (HCC)    Reflux esophagitis     SURGICAL HISTORY Past Surgical History:  Procedure Laterality Date   ABDOMINAL HYSTERECTOMY  2011ish   BIOPSY  07/11/2020   Procedure: BIOPSY;  Surgeon: Shila Gustav GAILS, MD;  Location: [facility];  Service: Endoscopy;;   CESAREAN SECTION     x 3    CHOLECYSTECTOMY     ESOPHAGOGASTRODUODENOSCOPY (EGD) WITH PROPOFOL  N/A 07/11/2020   Procedure: ESOPHAGOGASTRODUODENOSCOPY (EGD) WITH PROPOFOL ;  Surgeon: Shila Gustav GAILS, MD;  Location: [facility];  Service: Endoscopy;  Laterality: N/A;   IR BONE MARROW BIOPSY & ASPIRATION  08/12/2022   ORIF ANKLE FRACTURE Left 12/21/2014   Procedure: OPEN REDUCTION INTERNAL FIXATION (ORIF) LEFT LATERAL MALLEOLUS ANKLE FRACTURE;  Surgeon: Kay CHRISTELLA Cummins, MD;  Location: [facility] OR;  Service: Orthopedics;  Laterality: Left;  Needs RNFA   SPLENECTOMY, TOTAL N/A 04/02/2022   Procedure: OPEN SPLENECTOMY;  Surgeon: Vanderbilt Ned, MD;  Location: [facility] OR;   Service: General;  Laterality: N/A;  TAP BLOCK   UPPER GI ENDOSCOPY      SOCIAL HISTORY Social History[1]  Social History   Social History Narrative   No caffeine    Right handed   Lives at home with her husband and daughter    SOCIAL DRIVERS OF HEALTH SDOH Screenings   Food Insecurity: No Food Insecurity (02/28/2022)  Housing: Low Risk (02/28/2022)  Transportation Needs: No Transportation Needs (02/28/2022)  Utilities: Not At Risk (02/28/2022)  Depression (PHQ2-9): Low Risk (07/11/2023)  Social Connections: Unknown (10/07/2021)   Received from [facility]  Tobacco Use: Low Risk (05/31/2023)     FAMILY HISTORY Family History  Problem Relation Age of Onset   Migraines Mother    Breast cancer Other        Great MA    Colon cancer Neg Hx      ALLERGIES: has no known allergies.  MEDICATIONS  Current Outpatient Medications  Medication Sig Dispense Refill  apixaban  (ELIQUIS ) 5 MG TABS tablet Take 1 tablet (5 mg total) by mouth 2 (two) times daily. 60 tablet 11   aspirin  EC 81 MG tablet Take 1 tablet (81 mg total) by mouth daily. Swallow whole. 30 tablet 12   cyclobenzaprine (FLEXERIL) 10 MG tablet Take 10 mg by mouth 3 (three) times daily as needed.     hydroxyurea  (HYDREA ) 500 MG capsule Take 3 capsules (1,500 mg total) by mouth daily. May take with food to minimize GI side effects. 90 capsule 3   omeprazole (PRILOSEC OTC) 20 MG tablet Take 20 mg by mouth daily as needed (acid reflux).     propranolol  (INDERAL ) 20 MG tablet Take 1 tablet by mouth twice daily 60 tablet 0   senna-docusate (SENNA S) 8.6-50 MG tablet Take 2 tablets by mouth at bedtime. 60 tablet 1   No current facility-administered medications for this visit.   Facility-Administered Medications Ordered in Other Visits  Medication Dose Route Frequency Provider Last Rate Last Admin   gadopentetate dimeglumine  (MAGNEVIST ) injection 18 mL  18 mL Intravenous Once PRN Ines Onetha NOVAK, MD        PHYSICAL  EXAMINATION: ECOG PERFORMANCE STATUS: 1 - Symptomatic but completely ambulatory VITALS: Vitals:   06/22/24 1410  BP: 123/84  Pulse: 84  Resp: 20  Temp: (!) 97.3 F (36.3 C)  SpO2: 100%   Filed Weights   06/22/24 1410  Weight: 213 lb 4.8 oz (96.8 kg)   Body mass index is 37.78 kg/m.  GENERAL: alert, in no acute distress and comfortable SKIN: no acute rashes, no significant lesions EYES: conjunctiva are pink and non-injected, sclera anicteric OROPHARYNX: MMM, no exudates, no oropharyngeal erythema or ulceration NECK: supple, no JVD LYMPH:  no palpable lymphadenopathy in the cervical, axillary or inguinal regions LUNGS: clear to auscultation b/l with normal respiratory effort HEART: regular rate & rhythm ABDOMEN:  normoactive bowel sounds , non tender, not distended, no hepatosplenomegaly Extremity: no pedal edema PSYCH: alert & oriented x 3 with fluent speech NEURO: no focal motor/sensory deficits  LABORATORY DATA:   I have reviewed the data as listed     Latest Ref Rng & Units 06/22/2024    1:43 PM 06/22/2024    1:40 PM 03/23/2024    1:49 PM  CBC EXTENDED  WBC 4.0 - 10.5 K/uL  6.5  7.7   RBC 3.87 - 5.11 MIL/uL 3.47  3.48  3.09   Hemoglobin 12.0 - 15.0 g/dL  86.3  87.5   HCT 63.9 - 46.0 %  39.7  35.9   Platelets 150 - 400 K/uL  544  458   NEUT# 1.7 - 7.7 K/uL  3.2  4.4   Lymph# 0.7 - 4.0 K/uL  2.0  2.2        Latest Ref Rng & Units 06/22/2024    1:40 PM 03/23/2024    1:49 PM 01/27/2024    1:54 PM  CMP  Glucose 70 - 99 mg/dL 95  92  898   BUN 6 - 20 mg/dL 13  13  10    Creatinine 0.44 - 1.00 mg/dL 9.08  9.13  9.19   Sodium 135 - 145 mmol/L 137  138  139   Potassium 3.5 - 5.1 mmol/L 4.4  4.0  3.7   Chloride 98 - 111 mmol/L 101  106  106   CO2 22 - 32 mmol/L 28  28  28    Calcium 8.9 - 10.3 mg/dL 9.8  89.9  9.3   Total  Protein 6.5 - 8.1 g/dL 8.4  7.4  7.4   Total Bilirubin 0.0 - 1.2 mg/dL 0.4  0.4  0.5   Alkaline Phos 38 - 126 U/L 118  99  93   AST 15 - 41 U/L  31  14  14    ALT 0 - 44 U/L 20  10  11     Iron /TIBC/Ferritin/ %Sat    Component Value Date/Time   IRON  6 (L) 08/09/2022 1031   TIBC 372 08/09/2022 1031   FERRITIN 69 03/23/2024 1349   IRONPCTSAT 2 (L) 08/09/2022 1031     RADIOGRAPHIC STUDIES: I have personally reviewed the radiological images as listed and agreed with the findings in the report. No results found.  ASSESSMENT & PLAN:  54 y.o. female with  1.  Polycythemia vera diagnosed in 2017.  She was found to have JAK2 positive disease.  She had an excellent response to Jakafi  with improvement in her constitutional symptoms.  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   2.  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  Follow-up with vascular surgery was recommended.   4.  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  Her platelets were decreasing although her white cell count slightly up.  Restarting Jakafi  might help with this issue.   5. S/p Splenectomy   PLAN: - Discussed lab results on 06/22/2024 in detail with patient: -Hemglobin normal at 13.6 -Hematocrit 39.7 -Will put in a prescription for antifungal cream (terbinafine ) -no notable toxicities from current dose of hydrea  -continue hydrea  @ 1500mg  po daily Continue ASA  FOLLOW-UPRTC with Dr Onesimo with labs in 3 months  The total time spent in the appointment was 21 minutes* .  All of the patient's questions were answered and the patient knows to call the clinic with any problems, questions, or concerns.  Emaline Onesimo MD MS AAHIVMS [facility] Hematology/Oncology Physician [facility]  *Total Encounter Time as defined by the Centers for Medicare and Medicaid Services includes, in addition  to the face-to-face time of a patient visit (documented in the note above) non-face-to-face time: obtaining and reviewing outside history, ordering and reviewing medications, tests or procedures, care coordination (communications with other health care professionals or caregivers) and documentation in the medical record.  I, Alan Blowers, acting as a neurosurgeon for Emaline Onesimo, MD.,have documented all relevant documentation on the behalf of Emaline Onesimo, MD,as directed by  Emaline Onesimo, MD while in the presence of Emaline Onesimo, MD.  I have reviewed the above documentation for accuracy and completeness, and I agree with the above.  Emaline Onesimo, MD     [1]  Social History Tobacco Use   Smoking status: Never   Smokeless tobacco: Never  Vaping Use   Vaping status: Never Used  Substance Use Topics   Alcohol use: No   Drug use: No   "

## 2024-06-26 ENCOUNTER — Ambulatory Visit: Admitting: Radiology

## 2024-06-26 ENCOUNTER — Encounter: Payer: Self-pay | Admitting: Emergency Medicine

## 2024-06-26 ENCOUNTER — Ambulatory Visit: Admission: EM | Admit: 2024-06-26 | Discharge: 2024-06-26 | Disposition: A | Discharge status: Home / Self Care

## 2024-06-26 ENCOUNTER — Ambulatory Visit (HOSPITAL_COMMUNITY)

## 2024-06-26 DIAGNOSIS — M545 Low back pain, unspecified: Secondary | ICD-10-CM

## 2024-06-26 DIAGNOSIS — S39012A Strain of muscle, fascia and tendon of lower back, initial encounter: Secondary | ICD-10-CM | POA: Diagnosis not present

## 2024-06-26 MED ORDER — METHYLPREDNISOLONE 4 MG PO TBPK: ORAL_TABLET | ORAL | 0 refills | Status: AC

## 2024-06-26 MED ORDER — DICLOFENAC SODIUM 50 MG PO TBEC: 50.0000 mg | DELAYED_RELEASE_TABLET | Freq: Two times a day (BID) | ORAL | 0 refills | Status: AC

## 2024-06-26 MED ORDER — KETOROLAC TROMETHAMINE 30 MG/ML IJ SOLN
30.0000 mg | Freq: Once | INTRAMUSCULAR | Status: AC
  Administered 2024-06-26: 30 mg via INTRAMUSCULAR

## 2024-06-26 NOTE — ED Provider Notes (Signed)
 " [facility]    CSN: [id number] Arrival date & time: 06/26/24  1256      History   Chief Complaint Chief Complaint  Patient presents with   Back Pain    HPI Eileen [name] is a 54 y.o. female.   Ms . Eileen [name] presents with a chief complaint of severe lower back pain following a fall onto her back while skating on Wednesday. She had immediate pain after the incident, very little pain on day 1 followed by severe pain yesterday which has been unchanged since resurgence. She describes the pain as sharp and rated it as an eight out of ten. Initially, the pain was on the left side and involved the ribs, but it has since become centralized in the spine. The pain worsens with any movement, including walking, standing, and bumps. No numbness, tingling, or weakness in the legs was reported.  She also denies saddle paresthesia and bowel/bladder incontinence.  She does have a history of low back pain managed by [facility] last year.  She attempted self-treatment with Tylenol , Aleve, and methocarbamol , but these medications did not provide relief.  She has not taken any medications today.   The history is provided by the patient.  Back Pain Location:  Lumbar spine Quality:  Stabbing Radiates to:  Does not radiate Pain severity:  Severe Pain is:  Same all the time Onset quality:  Sudden Timing:  Constant Chronicity:  New Context: falling   Relieved by:  Nothing Worsened by:  Ambulation, bending, movement and twisting Ineffective treatments:  NSAIDs, being still, muscle relaxants and OTC medications Associated symptoms: no abdominal pain, no bladder incontinence, no bowel incontinence, no numbness, no paresthesias, no pelvic pain, no perianal numbness, no tingling and no weakness     Past Medical History:  Diagnosis Date   Acute duodenal ulcer without mention of hemorrhage, perforation, or obstruction    Anemia    due to the polycythemia   COVID 2022   mild case    Dysrhythmia    History of palpations, for many years   GERD (gastroesophageal reflux disease)    Headache    takes Advil   Polycythemia vera (HCC)    Reflux esophagitis     Patient Active Problem List   Diagnosis Date Noted   Splenomegaly 04/02/2022   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   Abdominal pain 07/10/2020   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 11/28/2016   Polycythemia vera (HCC) 02/20/2016   MORBID OBESITY 08/11/2007   Iron  deficiency anemia 08/11/2007   GERD (gastroesophageal reflux disease) 08/11/2007   GERD 08/11/2007   Headache 08/11/2007   Personal history of peptic ulcer disease 08/11/2007    Past Surgical History:  Procedure Laterality Date   ABDOMINAL HYSTERECTOMY  2011ish   BIOPSY  07/11/2020   Procedure: BIOPSY;  Surgeon: Shila Gustav GAILS, MD;  Location: [facility];  Service: Endoscopy;;   CESAREAN SECTION     x 3    CHOLECYSTECTOMY     ESOPHAGOGASTRODUODENOSCOPY (EGD) WITH PROPOFOL  N/A 07/11/2020   Procedure: ESOPHAGOGASTRODUODENOSCOPY (EGD) WITH PROPOFOL ;  Surgeon: Shila Gustav GAILS, MD;  Location: [facility];  Service: Endoscopy;  Laterality: N/A;   IR BONE MARROW BIOPSY & ASPIRATION  08/12/2022   ORIF ANKLE FRACTURE Left 12/21/2014   Procedure: OPEN REDUCTION INTERNAL FIXATION (ORIF) LEFT LATERAL MALLEOLUS ANKLE FRACTURE;  Surgeon: Kay CHRISTELLA Cummins, MD;  Location: [facility];  Service: Orthopedics;  Laterality: Left;  Needs RNFA   SPLENECTOMY, TOTAL N/A 04/02/2022  Procedure: OPEN SPLENECTOMY;  Surgeon: Vanderbilt Ned, MD;  Location: [facility] OR;  Service: General;  Laterality: N/A;  TAP BLOCK   UPPER GI ENDOSCOPY      OB History   No obstetric history on file.      Home Medications    Prior to Admission medications  Medication Sig Start Date End Date Taking? Authorizing Provider  diclofenac  (VOLTAREN ) 50 MG EC tablet Take 1 tablet (50 mg total) by mouth 2 (two) times daily. 06/26/24  Yes Leatrice Vernell HERO, NP   methylPREDNISolone  (MEDROL  DOSEPAK) 4 MG TBPK tablet Day 1: take 6 tabs in the morning; Day 2: take 5 tabs in the morning, decreasing by one pill each day until complete 06/26/24  Yes Leatrice Vernell HERO, NP  apixaban  (ELIQUIS ) 5 MG TABS tablet Take 1 tablet (5 mg total) by mouth 2 (two) times daily. 11/19/23   Onesimo Emaline Brink, MD  aspirin  EC 81 MG tablet Take 1 tablet (81 mg total) by mouth daily. Swallow whole. 09/30/22   Onesimo Emaline Brink, MD  cyclobenzaprine (FLEXERIL) 10 MG tablet Take 10 mg by mouth 3 (three) times daily as needed. 08/09/22   [provider]  hydroxyurea  (HYDREA ) 500 MG capsule Take 3 capsules (1,500 mg total) by mouth daily. May take with food to minimize GI side effects. 11/19/23   Kale, Gautam Kishore, MD  omeprazole (PRILOSEC OTC) 20 MG tablet Take 20 mg by mouth daily as needed (acid reflux).    [provider]  propranolol  (INDERAL ) 20 MG tablet Take 1 tablet by mouth twice daily 07/18/23   Vaslow, Zachary K, MD  senna-docusate (SENNA S) 8.6-50 MG tablet Take 2 tablets by mouth at bedtime. 01/27/24   Onesimo Emaline Brink, MD  terbinafine  (LAMISIL ) 1 % cream Apply 1 Application topically 2 (two) times daily. Apply twice daily to rash on face for 2-4 weeks (continue 1 week after clearing). Do not use topical steroids. 06/22/24   Onesimo Emaline Brink, MD    Family History Family History  Problem Relation Age of Onset   Migraines Mother    Breast cancer Other        Great [place]    Colon cancer Neg Hx     Social History Social History[1]   Allergies   Patient has no known allergies.   Review of Systems Review of Systems  Constitutional: Negative.   Gastrointestinal:  Negative for abdominal pain and bowel incontinence.  Genitourinary:  Negative for bladder incontinence and pelvic pain.  Musculoskeletal:  Positive for back pain and gait problem. Negative for joint swelling, myalgias, neck pain and neck stiffness.  Skin: Negative.   Neurological:   Negative for tingling, tremors, syncope, weakness, light-headedness, numbness and paresthesias.     Physical Exam Triage Vital Signs ED Triage Vitals [06/26/24 1311]  Encounter Vitals Group     BP 137/79     Girls Systolic BP Percentile      Girls Diastolic BP Percentile      Boys Systolic BP Percentile      Boys Diastolic BP Percentile      Pulse Rate 84     Resp 17     Temp 98 F (36.7 C)     Temp Source Oral     SpO2 99 %     Weight      Height      Head Circumference      Peak Flow      Pain Score 8     Pain  Loc      Pain Education      Exclude from Growth Chart    No data found.  Updated Vital Signs BP 137/79 (BP Location: Right Arm)   Pulse 84   Temp 98 F (36.7 C) (Oral)   Resp 17   SpO2 99%   Visual Acuity Right Eye Distance:   Left Eye Distance:   Bilateral Distance:    Right Eye Near:   Left Eye Near:    Bilateral Near:     Physical Exam Vitals and nursing note reviewed.  Constitutional:      General: She is in acute distress (posture guarded; appears in pain).     Appearance: Normal appearance. She is obese. She is not ill-appearing, toxic-appearing or diaphoretic.  Cardiovascular:     Rate and Rhythm: Normal rate and regular rhythm.     Heart sounds: Normal heart sounds.  Pulmonary:     Effort: Pulmonary effort is normal.     Breath sounds: Normal breath sounds.  Chest:     Chest wall: Tenderness (left lower ribs posterior laterally) present. No swelling.  Abdominal:     Palpations: Abdomen is soft.  Musculoskeletal:     Thoracic back: Normal.     Lumbar back: Tenderness present. No swelling, signs of trauma, spasms or bony tenderness (diffuse lumbar spine). Decreased range of motion (guarded due to pain; unable to fully assess). Negative right straight leg raise test and negative left straight leg raise test. No scoliosis.  Neurological:     General: No focal deficit present.     Mental Status: She is alert and oriented to person,  place, and time.  Psychiatric:        Mood and Affect: Mood normal.        Behavior: Behavior normal.      UC Treatments / Results  Labs (all labs ordered are listed, but only abnormal results are displayed) Labs Reviewed - No data to display  EKG   Radiology DG Lumbar Spine Complete Result Date: 06/26/2024 EXAM: 4 OR MORE VIEW(S) XRAY OF THE LUMBAR SPINE 06/26/2024 02:02:40 PM COMPARISON: None available. CLINICAL HISTORY: Fall onto back; midline lumbar tenderness and severe pain. FINDINGS: LUMBAR SPINE: BONES: Vertebral body heights are maintained. Alignment is normal. No subluxation. No pars defect identified. No acute findings lumbar spine. DISCS AND DEGENERATIVE CHANGES: There is mild endplate spurring at several levels. No severe degenerative changes. SOFT TISSUES: No acute abnormality. IMPRESSION: 1. No acute findings of the lumbar spine. Electronically signed by: Norleen Boxer MD 06/26/2024 02:39 PM EST RP Workstation: [id number]    Procedures Procedures (including critical care time)  Medications Ordered in UC Medications  ketorolac  (TORADOL ) 30 MG/ML injection 30 mg (30 mg Intramuscular Given 06/26/24 1342)    Initial Impression / Assessment and Plan / UC Course  I have reviewed the triage vital signs and the nursing notes.  Pertinent labs & imaging results that were available during my care of the patient were reviewed by me and considered in my medical decision making (see chart for details).    MDM: During the visit, we discussed potential differential diagnoses, including a lumbar ligament strain, which I suspect due to the midline spine tenderness and the absence of fracture on the lumbar X-ray. The lumbar X-ray was negative for fracture. Pain after ketorolac  is now two out of ten, and the patient is able to move freely without a guarded range of motion.  The plan includes continuing to use Tylenol  as  needed throughout the day for pain control. Starting tomorrow, the  patient will begin taking diclofenac  50 mg twice daily for up to two weeks. Additionally, a methylprednisolone  taper pack was prescribed, starting with six pills on day one, decreasing by one pill each day until completion. The patient was instructed to monitor for numbness, tingling, weakness, saddle paresthesia, and severe pain, and to go to the emergency room if these symptoms occur. She expressed comfort with the plan of care.  Final Clinical Impressions(s) / UC Diagnoses   Final diagnoses:  Acute midline low back pain without sciatica  Strain of lumbar region, initial encounter     Discharge Instructions       - Lumbar spine strain diagnosed - X-ray: No fracture, dislocation, or acute bony abnormality - Treatment in office: Ketorolac  (Toradol ) with reported pain relief  Medications - Diclofenac  75 mg: Begin tomorrow. Take one tablet twice daily with food. Avoid other NSAIDs (ibuprofen, naproxen, aspirin  for pain) while taking this medication. - Methylprednisolone  dose pack: Take exactly as directed on the package (6 tablets day 1, then 5, 4, 3, 2, 1). Take with food. Do not skip or double doses.  Activity & Body Mechanics - Continue light activity as tolerated; prolonged bed rest is not recommended - Avoid heavy lifting, twisting, bending, or high-impact activity - Use proper posture and body mechanics when sitting, standing, or lifting  Ice / Heat Therapy - Ice: Helpful for acute pain or inflammation (15-20 minutes, 2-3 times daily) - Heat: May help muscle tightness or stiffness (15-20 minutes as needed) - Do not apply directly to skin; alternate as tolerated  Stretching & Mobility - Begin gentle stretching once pain allows - Stop if sharp pain occurs - Gradually increase range of motion as symptoms improve  Expected Course - Pain and stiffness should improve over several days to weeks - Mild soreness with movement can be normal during recovery  Emergency Department  Precautions Seek immediate care for: - New or worsening leg weakness, numbness, or tingling - Loss of bowel or bladder control - Saddle anesthesia - Severe or worsening pain not relieved with medication - Fever, chills, or new trauma  Follow-Up - Return for evaluation if symptoms persist, worsen, or do not improve as expected - Consider physical therapy if pain continues beyond the acute phase      ED Prescriptions     Medication Sig Dispense Auth. Provider   diclofenac  (VOLTAREN ) 50 MG EC tablet Take 1 tablet (50 mg total) by mouth 2 (two) times daily. 28 tablet Leatrice Vernell HERO, NP   methylPREDNISolone  (MEDROL  DOSEPAK) 4 MG TBPK tablet Day 1: take 6 tabs in the morning; Day 2: take 5 tabs in the morning, decreasing by one pill each day until complete 21 tablet Leatrice Vernell HERO, NP      PDMP not reviewed this encounter.     [1]  Social History Tobacco Use   Smoking status: Never   Smokeless tobacco: Never  Vaping Use   Vaping status: Never Used  Substance Use Topics   Alcohol use: No   Drug use: No     Leatrice Vernell HERO, NP 06/26/24 1521  "

## 2024-06-26 NOTE — ED Triage Notes (Signed)
 Pt states she went skating on Wednesday with nieces and fell flat on her back. She had pain the first day which got a little better Thursday. Pain has been much worse last two days.

## 2024-06-26 NOTE — Discharge Instructions (Signed)
" °-   Lumbar spine strain diagnosed - X-ray: No fracture, dislocation, or acute bony abnormality - Treatment in office: Ketorolac  (Toradol ) with reported pain relief  Medications - Diclofenac  75 mg: Begin tomorrow. Take one tablet twice daily with food. Avoid other NSAIDs (ibuprofen, naproxen, aspirin  for pain) while taking this medication. - Methylprednisolone  dose pack: Take exactly as directed on the package (6 tablets day 1, then 5, 4, 3, 2, 1). Take with food. Do not skip or double doses.  Activity & Body Mechanics - Continue light activity as tolerated; prolonged bed rest is not recommended - Avoid heavy lifting, twisting, bending, or high-impact activity - Use proper posture and body mechanics when sitting, standing, or lifting  Ice / Heat Therapy - Ice: Helpful for acute pain or inflammation (15-20 minutes, 2-3 times daily) - Heat: May help muscle tightness or stiffness (15-20 minutes as needed) - Do not apply directly to skin; alternate as tolerated  Stretching & Mobility - Begin gentle stretching once pain allows - Stop if sharp pain occurs - Gradually increase range of motion as symptoms improve  Expected Course - Pain and stiffness should improve over several days to weeks - Mild soreness with movement can be normal during recovery  Emergency Department Precautions Seek immediate care for: - New or worsening leg weakness, numbness, or tingling - Loss of bowel or bladder control - Saddle anesthesia - Severe or worsening pain not relieved with medication - Fever, chills, or new trauma  Follow-Up - Return for evaluation if symptoms persist, worsen, or do not improve as expected - Consider physical therapy if pain continues beyond the acute phase  "

## 2024-09-21 ENCOUNTER — Inpatient Hospital Stay: Attending: Oncology

## 2024-09-21 ENCOUNTER — Inpatient Hospital Stay: Admitting: Hematology
